# Patient Record
Sex: Female | Born: 1995 | Race: Black or African American | Hispanic: No | Marital: Single | Smoking: Former smoker
Health system: Southern US, Community
[De-identification: ages and names within clinical notes are randomized; demographics above are authoritative.]

## PROBLEM LIST (undated history)

## (undated) ENCOUNTER — Inpatient Hospital Stay (HOSPITAL_COMMUNITY): Payer: Self-pay

## (undated) DIAGNOSIS — R569 Unspecified convulsions: Secondary | ICD-10-CM

## (undated) DIAGNOSIS — E282 Polycystic ovarian syndrome: Secondary | ICD-10-CM

## (undated) DIAGNOSIS — F419 Anxiety disorder, unspecified: Secondary | ICD-10-CM

## (undated) DIAGNOSIS — B009 Herpesviral infection, unspecified: Secondary | ICD-10-CM

## (undated) DIAGNOSIS — F32A Depression, unspecified: Secondary | ICD-10-CM

## (undated) DIAGNOSIS — L732 Hidradenitis suppurativa: Secondary | ICD-10-CM

## (undated) DIAGNOSIS — F329 Major depressive disorder, single episode, unspecified: Secondary | ICD-10-CM

## (undated) DIAGNOSIS — J45909 Unspecified asthma, uncomplicated: Secondary | ICD-10-CM

## (undated) HISTORY — DX: Polycystic ovarian syndrome: E28.2

## (undated) HISTORY — DX: Hidradenitis suppurativa: L73.2

## (undated) HISTORY — PX: CYSTECTOMY: SUR359

---
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
## 2017-01-04 ENCOUNTER — Ambulatory Visit (HOSPITAL_COMMUNITY)
Admission: RE | Admit: 2017-01-04 | Discharge: 2017-01-04 | Disposition: A | Discharge status: Home / Self Care | Payer: Medicaid Other | Source: Ambulatory Visit | Attending: Family Medicine | Admitting: Family Medicine

## 2017-01-04 DIAGNOSIS — G8929 Other chronic pain: Secondary | ICD-10-CM | POA: Diagnosis not present

## 2017-01-04 DIAGNOSIS — R102 Pelvic and perineal pain: Secondary | ICD-10-CM | POA: Diagnosis not present

## 2017-01-11 ENCOUNTER — Other Ambulatory Visit: Payer: Self-pay | Admitting: Family Medicine

## 2017-01-11 DIAGNOSIS — R102 Pelvic and perineal pain: Principal | ICD-10-CM

## 2017-01-11 DIAGNOSIS — G8929 Other chronic pain: Secondary | ICD-10-CM

## 2017-01-11 DIAGNOSIS — Z8742 Personal history of other diseases of the female genital tract: Secondary | ICD-10-CM

## 2017-01-12 ENCOUNTER — Telehealth: Payer: Self-pay

## 2017-01-12 NOTE — Telephone Encounter (Signed)
-----   Message from Lizbeth BarkMandesia R Hairston, FNP sent at 01/11/2017  5:08 PM EDT ----- No mass no uterine fibroid seen.  No ovarian cysts or mass. You will be referred to gynecology for further evaluation.

## 2017-01-12 NOTE — Telephone Encounter (Signed)
CMA call regarding results   Patient did not answer but left a VM stating the reason of the call & to call back if have any questions   

## 2017-01-21 ENCOUNTER — Encounter: Payer: Self-pay | Admitting: Medical

## 2017-01-21 ENCOUNTER — Ambulatory Visit (INDEPENDENT_AMBULATORY_CARE_PROVIDER_SITE_OTHER): Payer: Medicaid Other | Admitting: Medical

## 2017-01-21 VITALS — BP 116/66 | HR 64 | Ht 65.0 in | Wt 186.0 lb

## 2017-01-21 DIAGNOSIS — N926 Irregular menstruation, unspecified: Secondary | ICD-10-CM

## 2017-01-21 MED ORDER — PRENATAL VITAMINS 0.8 MG PO TABS: 1.0000 | ORAL_TABLET | Freq: Every day | ORAL | 12 refills | Status: DC

## 2017-01-21 NOTE — Progress Notes (Signed)
History:  [name] is a 21 y.o. female who presents to clinic today for follow-up for PCOS and irregular periods. The patient was started on OCPs to attempt to regulate her cycles. She also wants to conceive, so this was expected to be a short term plan. Since her last visit the patient has discontinued OCPs on her own after 3 months. She states that she has had 2 regular periods since then. She has tried ovulation tests once and had a positive. Her and her partner did attempt to conceive that month, but were unsuccessful. She did not take ovulation tests this month. She has also been having lower abdominal pain off and on for months. She states that it got much worse a few weeks ago and she went to her PCP. All infection testing, pap smear and [place] were negative. She feels pain is improved today. Pain seems to be unrelated to her cycles, diet or activity.    The following portions of the patient's history were reviewed and updated as appropriate: allergies, current medications, family history, past medical history, social history, past surgical history and problem list.  Review of Systems:  Review of Systems  Constitutional: Negative for chills and fever.  Gastrointestinal: Positive for abdominal pain. Negative for constipation, diarrhea, nausea and vomiting.  Genitourinary: Negative for dysuria, frequency and urgency.       Neg - vaginal bleeding      Objective:  Physical Exam BP 116/66   Pulse 64   Ht 5\' 5"  (1.651 m)   Wt 186 lb (84.4 kg)   LMP 12/25/2016   BMI 30.95 kg/m  Physical Exam  Constitutional: She is oriented to person, place, and time. She appears well-developed and well-nourished. No distress.  HENT:  Head: Normocephalic.  Cardiovascular: Normal rate.   Pulmonary/Chest: Effort normal.  Abdominal: Soft. She exhibits no distension.  Neurological: She is alert and oriented to person, place, and time.  Skin: Skin is warm and dry. No erythema.  Psychiatric: She has a  normal mood and affect.  Vitals reviewed.   Assessment & Plan:  PCOS Desired fertility   Patient advised to continue to track periods and ovulation Patient will continue to have timed intercourse If periods become irregular or she has not conceived in 1 year, will refer to Dr. April Manson for infertility  Continue Ibuprofen PRN for pain Rx for prenatal vitamins sent to patient's pharmacy   Marny Lowenstein, PA-C 01/21/2017 10:37 AM

## 2017-01-21 NOTE — Patient Instructions (Addendum)
Continue tracking periods regularly Continue monthly ovulation tests A period is considered regular if it is 26-32 days from the first day of one period to the first day of the next If 2 periods do not fall in regular range, then call the office for referral to infertility  If no positive ovulation test for 2 months, then call the office for referral to infertility Have intercourse with positive ovulation tests Take 1 prenatal vitamin every day Avoid alcohol If abdominal pain worsens or continues for more than 1 month, call primary doctor for appointment If you are not pregnant in 1 year and have been having regular periods, call the office for a referral to infertility

## 2017-01-22 ENCOUNTER — Encounter: Payer: Self-pay | Admitting: Family Medicine

## 2017-01-22 ENCOUNTER — Ambulatory Visit: Payer: Medicaid Other | Attending: Family Medicine | Admitting: Family Medicine

## 2017-01-22 VITALS — BP 109/70 | HR 74 | Temp 98.4°F | Resp 18 | Ht 65.0 in | Wt 190.0 lb

## 2017-01-22 DIAGNOSIS — R109 Unspecified abdominal pain: Secondary | ICD-10-CM | POA: Diagnosis present

## 2017-01-22 DIAGNOSIS — R350 Frequency of micturition: Secondary | ICD-10-CM

## 2017-01-22 DIAGNOSIS — R1032 Left lower quadrant pain: Secondary | ICD-10-CM | POA: Diagnosis not present

## 2017-01-22 DIAGNOSIS — Z79899 Other long term (current) drug therapy: Secondary | ICD-10-CM | POA: Diagnosis not present

## 2017-01-22 DIAGNOSIS — G8929 Other chronic pain: Secondary | ICD-10-CM

## 2017-01-22 DIAGNOSIS — R102 Pelvic and perineal pain: Secondary | ICD-10-CM | POA: Diagnosis not present

## 2017-01-22 LAB — POCT URINE PREGNANCY: PREG TEST UR: NEGATIVE

## 2017-01-22 LAB — POCT URINALYSIS DIPSTICK
Bilirubin, UA: NEGATIVE
GLUCOSE UA: NEGATIVE
Ketones, UA: NEGATIVE
NITRITE UA: NEGATIVE
PH UA: 7.5 (ref 5.0–8.0)
Protein, UA: NEGATIVE
RBC UA: NEGATIVE
Spec Grav, UA: 1.015 (ref 1.010–1.025)
UROBILINOGEN UA: 0.2 U/dL

## 2017-01-22 LAB — POCT GLYCOSYLATED HEMOGLOBIN (HGB A1C): Hemoglobin A1C: 5.5

## 2017-01-22 MED ORDER — IBUPROFEN 600 MG PO TABS: 600.0000 mg | ORAL_TABLET | Freq: Four times a day (QID) | ORAL | 0 refills | Status: DC | PRN

## 2017-01-22 NOTE — Progress Notes (Signed)
Patient is here for f/up  

## 2017-01-22 NOTE — Progress Notes (Signed)
Subjective:  Patient ID: Jenna Henry, female    DOB: 03/01/1996  Age: 21 y.o. MRN: [id number]  CC: Abdominal Pain   HPI Jenna Henry presents for abdominal pain. The pain is described as aching, and is moderate to severe in intensity. Pain 7 to 8/10. Onset was several months ago.  Denies worsening of symptoms with movement and eating. Previous workup includes ultrasound and gynecology referral. Alleviating factors: NSAID's. The patient denies chills, dysuria, hematochezia, hematuria and melena. She does reports increased urinary frequency.  Outpatient Medications Prior to Visit  Medication Sig Dispense Refill  . acetaminophen (TYLENOL) 500 MG tablet Take 1,000 mg by mouth every 6 (six) hours as needed for mild pain or moderate pain.    Marland Kitchen doxycycline (VIBRAMYCIN) 100 MG capsule Take 1 capsule (100 mg total) by mouth 2 (two) times daily. One po bid x 7 days (Patient not taking: Reported on 10/12/2016) 14 capsule 0  . famotidine (PEPCID) 20 MG tablet Take 1 tablet (20 mg total) by mouth 2 (two) times daily. 30 tablet 0  . fluconazole (DIFLUCAN) 150 MG tablet Take 1 tab (150 mg) today and 1 tab (150 mg) after completing antibiotics (Patient not taking: Reported on 10/27/2016) 2 tablet 0  . levonorgestrel-ethinyl estradiol (AVIANE,ALESSE,LESSINA) 0.1-20 MG-MCG tablet Take 1 tablet by mouth daily. (Patient not taking: Reported on 12/03/2016) 1 Package 11  . metroNIDAZOLE (FLAGYL) 500 MG tablet Take 1 tablet (500 mg total) by mouth 2 (two) times daily. (Patient not taking: Reported on 10/27/2016) 14 tablet 0  . omeprazole (PRILOSEC) 20 MG capsule Take 1 capsule (20 mg total) by mouth daily. (Patient not taking: Reported on 12/03/2016) 14 capsule 2  . ondansetron (ZOFRAN ODT) 8 MG disintegrating tablet Take 1 tablet (8 mg total) by mouth every 8 (eight) hours as needed for nausea or vomiting. 20 tablet 0  . Prenatal Multivit-Min-Fe-FA (PRENATAL VITAMINS) 0.8 MG tablet Take 1 tablet by mouth daily. 30 tablet 12    . valACYclovir (VALTREX) 500 MG tablet Take 1 tablet (500 mg total) by mouth daily. 30 tablet 6  . ibuprofen (ADVIL,MOTRIN) 600 MG tablet Take 1 tablet (600 mg total) by mouth every 6 (six) hours as needed (Take with food.). 30 tablet 0   No facility-administered medications prior to visit.     ROS Review of Systems  Constitutional: Negative.   HENT: Negative.   Eyes: Negative.   Respiratory: Negative.   Cardiovascular: Negative.   Gastrointestinal: Positive for abdominal pain.  Genitourinary: Negative.   Skin: Negative.   Psychiatric/Behavioral: Negative.     Objective:  BP 109/70 (BP Location: Left Arm, Patient Position: Sitting, Cuff Size: Normal)   Pulse 74   Temp 98.4 F (36.9 C) (Oral)   Resp 18   Ht 5\' 5"  (1.651 m)   Wt 190 lb (86.2 kg)   LMP 12/25/2016   SpO2 100%   BMI 31.62 kg/m   BP/Weight 01/22/2017 01/21/2017 12/23/2016  Systolic BP 109 116 99  Diastolic BP 70 66 65  Wt. (Lbs) 190 186 187.8  BMI 31.62 30.95 31.25     Physical Exam  Constitutional: She appears well-developed and well-nourished.  HENT:  Head: Normocephalic and atraumatic.  Right Ear: External ear normal.  Left Ear: External ear normal.  Nose: Nose normal.  Mouth/Throat: Oropharynx is clear and moist.  Eyes: Pupils are equal, round, and reactive to light. Conjunctivae are normal.  Neck: Normal range of motion. Neck supple. No JVD present.  Cardiovascular: Normal rate, regular rhythm, normal  heart sounds and intact distal pulses.   Pulmonary/Chest: Effort normal and breath sounds normal.  Abdominal: Soft. Bowel sounds are normal. There is tenderness.  Lymphadenopathy:    She has no cervical adenopathy.  Skin: Skin is warm and dry.  Psychiatric: She has a normal mood and affect.  Nursing note and vitals reviewed.   Assessment & Plan:   Problem List Items Addressed This Visit    None    Visit Diagnoses    Chronic abdominal pain    -  Primary   Relevant Medications   ibuprofen  (ADVIL,MOTRIN) 600 MG tablet   Other Relevant Orders   Ambulatory referral to Gastroenterology   LLQ abdominal pain       Relevant Orders   POCT urine pregnancy (Completed)   Increased urinary frequency       Relevant Orders   POCT glycosylated hemoglobin (Hb A1C) (Completed)   Urinalysis Dipstick (Completed)   Chronic pelvic pain in female       Relevant Medications   ibuprofen (ADVIL,MOTRIN) 600 MG tablet      Meds ordered this encounter  Medications  . ibuprofen (ADVIL,MOTRIN) 600 MG tablet    Sig: Take 1 tablet (600 mg total) by mouth every 6 (six) hours as needed (Take with food.).    Dispense:  30 tablet    Refill:  0    Order Specific Question:   Supervising Provider    Answer:   Quentin Angst [id number]    Follow-up: Return if symptoms worsen or fail to improve.   Lizbeth Bark FNP

## 2017-01-22 NOTE — Patient Instructions (Signed)

## 2017-02-11 ENCOUNTER — Other Ambulatory Visit (HOSPITAL_COMMUNITY)
Admission: RE | Admit: 2017-02-11 | Discharge: 2017-02-11 | Disposition: A | Discharge status: Home / Self Care | Payer: Medicaid Other | Source: Ambulatory Visit | Attending: Family Medicine | Admitting: Family Medicine

## 2017-02-11 ENCOUNTER — Ambulatory Visit: Payer: Self-pay | Attending: Family Medicine | Admitting: Family Medicine

## 2017-02-11 ENCOUNTER — Encounter: Payer: Self-pay | Admitting: Family Medicine

## 2017-02-11 VITALS — BP 101/66 | HR 67 | Temp 99.3°F | Resp 18 | Ht 65.0 in | Wt 193.8 lb

## 2017-02-11 DIAGNOSIS — N898 Other specified noninflammatory disorders of vagina: Secondary | ICD-10-CM | POA: Insufficient documentation

## 2017-02-11 DIAGNOSIS — Z789 Other specified health status: Secondary | ICD-10-CM

## 2017-02-11 DIAGNOSIS — R102 Pelvic and perineal pain: Secondary | ICD-10-CM | POA: Insufficient documentation

## 2017-02-11 DIAGNOSIS — R110 Nausea: Secondary | ICD-10-CM | POA: Insufficient documentation

## 2017-02-11 DIAGNOSIS — N939 Abnormal uterine and vaginal bleeding, unspecified: Secondary | ICD-10-CM | POA: Insufficient documentation

## 2017-02-11 DIAGNOSIS — G8929 Other chronic pain: Secondary | ICD-10-CM | POA: Insufficient documentation

## 2017-02-11 DIAGNOSIS — R109 Unspecified abdominal pain: Secondary | ICD-10-CM | POA: Insufficient documentation

## 2017-02-11 DIAGNOSIS — M5440 Lumbago with sciatica, unspecified side: Secondary | ICD-10-CM | POA: Insufficient documentation

## 2017-02-11 DIAGNOSIS — M545 Low back pain: Secondary | ICD-10-CM

## 2017-02-11 DIAGNOSIS — R42 Dizziness and giddiness: Secondary | ICD-10-CM | POA: Insufficient documentation

## 2017-02-11 LAB — POCT URINALYSIS DIPSTICK
BILIRUBIN UA: NEGATIVE
Blood, UA: NEGATIVE
GLUCOSE UA: NEGATIVE
Ketones, UA: NEGATIVE
Nitrite, UA: NEGATIVE
Protein, UA: NEGATIVE
SPEC GRAV UA: 1.015 (ref 1.010–1.025)
Urobilinogen, UA: 0.2 E.U./dL
pH, UA: 5.5 (ref 5.0–8.0)

## 2017-02-11 LAB — POCT URINE PREGNANCY: Preg Test, Ur: NEGATIVE

## 2017-02-11 MED ORDER — MECLIZINE HCL 32 MG PO TABS: 32.0000 mg | ORAL_TABLET | Freq: Three times a day (TID) | ORAL | 0 refills | Status: DC | PRN

## 2017-02-11 MED ORDER — IBUPROFEN 600 MG PO TABS: 600.0000 mg | ORAL_TABLET | Freq: Four times a day (QID) | ORAL | 0 refills | Status: DC | PRN

## 2017-02-11 NOTE — Progress Notes (Signed)
Patient is here for back pain & lower abdomen pain

## 2017-02-11 NOTE — Progress Notes (Signed)
Subjective:  Patient ID: Jenna Henry, female    DOB: [birth year]  Age: 21 y.o. MRN: [id number]  CC: Abdominal Pain   HPI Jenna Henry presents for abdominal pain. The pain is described as aching, and is moderate to severe in intensity. Pain 7 to 8/10. Onset was several months ago.  Denies worsening of symptoms with movement and eating. Previous workup includes ultrasound and gynecology referral. Findings were unremarkable. Alleviating factors: NSAID's. The patient denies chills, dysuria, hematochezia, hematuria and melena. She does reports increased urinary frequency,  vaginal discharge, and dizziness. She denies any headaches, palpitations,  or tinnitus. Discharge described as white, moderate. Currently sexually active she reports 1 sexual partner within the last 3 months. She reports attempting to conceive.  Outpatient Medications Prior to Visit  Medication Sig Dispense Refill  . Prenatal Multivit-Min-Fe-FA (PRENATAL VITAMINS) 0.8 MG tablet Take 1 tablet by mouth daily. 30 tablet 12  . acetaminophen (TYLENOL) 500 MG tablet Take 1,000 mg by mouth every 6 (six) hours as needed for mild pain or moderate pain.    Marland Kitchen levonorgestrel-ethinyl estradiol (AVIANE,ALESSE,LESSINA) 0.1-20 MG-MCG tablet Take 1 tablet by mouth daily. (Patient not taking: Reported on 12/03/2016) 1 Package 11  . ondansetron (ZOFRAN ODT) 8 MG disintegrating tablet Take 1 tablet (8 mg total) by mouth every 8 (eight) hours as needed for nausea or vomiting. 20 tablet 0  . valACYclovir (VALTREX) 500 MG tablet Take 1 tablet (500 mg total) by mouth daily. 30 tablet 6  . doxycycline (VIBRAMYCIN) 100 MG capsule Take 1 capsule (100 mg total) by mouth 2 (two) times daily. One po bid x 7 days (Patient not taking: Reported on 10/12/2016) 14 capsule 0  . famotidine (PEPCID) 20 MG tablet Take 1 tablet (20 mg total) by mouth 2 (two) times daily. 30 tablet 0  . fluconazole (DIFLUCAN) 150 MG tablet Take 1 tab (150 mg) today and 1 tab (150 mg) after  completing antibiotics (Patient not taking: Reported on 10/27/2016) 2 tablet 0  . ibuprofen (ADVIL,MOTRIN) 600 MG tablet Take 1 tablet (600 mg total) by mouth every 6 (six) hours as needed (Take with food.). 30 tablet 0  . metroNIDAZOLE (FLAGYL) 500 MG tablet Take 1 tablet (500 mg total) by mouth 2 (two) times daily. (Patient not taking: Reported on 10/27/2016) 14 tablet 0  . omeprazole (PRILOSEC) 20 MG capsule Take 1 capsule (20 mg total) by mouth daily. (Patient not taking: Reported on 12/03/2016) 14 capsule 2   No facility-administered medications prior to visit.     ROS Review of Systems  Constitutional: Negative.   HENT: Negative.   Eyes: Negative.   Respiratory: Negative.   Cardiovascular: Negative.   Gastrointestinal: Positive for abdominal pain.  Genitourinary: Negative.   Skin: Negative.   Psychiatric/Behavioral: Negative.     Objective:  BP 101/66 (BP Location: Left Arm, Patient Position: Sitting, Cuff Size: Normal)   Pulse 67   Temp 99.3 F (37.4 C) (Oral)   Resp 18   Ht  (1.651 m)   Wt 193 lb 12.8 oz (87.9 kg)   SpO2 99%   BMI 32.25 kg/m   BP/Weight 02/11/2017 01/22/2017 01/21/2017  Systolic BP 101 109 116  Diastolic BP 66 70 66  Wt. (Lbs) 193.8 190 186  BMI 32.25 31.62 30.95     Physical Exam  Constitutional: She appears well-developed and well-nourished.  HENT:  Head: Normocephalic and atraumatic.  Right Ear: External ear normal.  Left Ear: External ear normal.  Nose: Nose normal.  Mouth/Throat:  Oropharynx is clear and moist.  Eyes: Pupils are equal, round, and reactive to light. Conjunctivae are normal.  Neck: Normal range of motion. Neck supple. No JVD present.  Cardiovascular: Normal rate, regular rhythm, normal heart sounds and intact distal pulses.   Pulmonary/Chest: Effort normal and breath sounds normal.  Abdominal: Soft. Bowel sounds are normal. There is tenderness.  Lymphadenopathy:    She has no cervical adenopathy.  Skin: Skin is warm and  dry.  Psychiatric: She has a normal mood and affect.  Nursing note and vitals reviewed.   Assessment & Plan:   1. Chronic abdominal pain  - ibuprofen (ADVIL,MOTRIN) 600 MG tablet; Take 1 tablet (600 mg total) by mouth every 6 (six) hours as needed (Take with food.).  Dispense: 30 tablet; Refill: 0 - Ambulatory referral to Gastroenterology  2. Vaginal discharge  - Urine cytology ancillary only - POCT urine pregnancy  3. Acute midline low back pain, with sciatica presence unspecified  - Urinalysis Dipstick - ibuprofen (ADVIL,MOTRIN) 600 MG tablet; Take 1 tablet (600 mg total) by mouth every 6 (six) hours as needed (Take with food.).  Dispense: 30 tablet; Refill: 0  4. Dizziness  - meclizine (ANTIVERT) 32 MG tablet; Take 1 tablet (32 mg total) by mouth 3 (three) times daily as needed.  Dispense: 30 tablet; Refill: 0  5. Nausea  - meclizine (ANTIVERT) 32 MG tablet; Take 1 tablet (32 mg total) by mouth 3 (three) times daily as needed.  Dispense: 30 tablet; Refill: 0  6. Attempting to conceive  - POCT urine pregnancy  7. Chronic pelvic pain in female  - ibuprofen (ADVIL,MOTRIN) 600 MG tablet; Take 1 tablet (600 mg total) by mouth every 6 (six) hours as needed (Take with food.).  Dispense: 30 tablet; Refill: 0 - Urine cytology ancillary only   Meds ordered this encounter  Medications  . ibuprofen (ADVIL,MOTRIN) 600 MG tablet    Sig: Take 1 tablet (600 mg total) by mouth every 6 (six) hours as needed (Take with food.).    Dispense:  30 tablet    Refill:  0    Order Specific Question:   Supervising Provider    Answer:   Quentin AngstJEGEDE, OLUGBEMIGA E [id number]]  . meclizine (ANTIVERT) 32 MG tablet    Sig: Take 1 tablet (32 mg total) by mouth 3 (three) times daily as needed.    Dispense:  30 tablet    Refill:  0    Order Specific Question:   Supervising Provider    Answer:   Quentin AngstJEGEDE, OLUGBEMIGA E [id number]    Follow-up: Return if symptoms worsen or fail to improve.   Jenna BarkMandesia R  Daven Montz FNP

## 2017-02-11 NOTE — Patient Instructions (Signed)
[facility].   [phone]  [address], [place], [place] [id number]    Abdominal Pain, Adult Many things can cause belly (abdominal) pain. Most times, belly pain is not dangerous. Many cases of belly pain can be watched and treated at home. Sometimes belly pain is serious, though. Your doctor will try to find the cause of your belly pain. Follow these instructions at home:  Take over-the-counter and prescription medicines only as told by your doctor. Do not take medicines that help you poop (laxatives) unless told to by your doctor.  Drink enough fluid to keep your pee (urine) clear or pale yellow.  Watch your belly pain for any changes.  Keep all follow-up visits as told by your doctor. This is important. Contact a doctor if:  Your belly pain changes or gets worse.  You are not hungry, or you lose weight without trying.  You are having trouble pooping (constipated) or have watery poop (diarrhea) for more than 2-3 days.  You have pain when you pee or poop.  Your belly pain wakes you up at night.  Your pain gets worse with meals, after eating, or with certain foods.  You are throwing up and cannot keep anything down.  You have a fever. Get help right away if:  Your pain does not go away as soon as your doctor says it should.  You cannot stop throwing up.  Your pain is only in areas of your belly, such as the right side or the left lower part of the belly.  You have bloody or black poop, or poop that looks like tar.  You have very bad pain, cramping, or bloating in your belly.  You have signs of not having enough fluid or water in your body (dehydration), such as: ? Dark pee, very little pee, or no pee. ? Cracked lips. ? Dry mouth. ? Sunken eyes. ? Sleepiness. ? Weakness. This information is not intended to replace advice given to you by your health care provider. Make sure you discuss any questions you have with your health care provider. Document  Released: 11/04/2007 Document Revised: 12/06/2015 Document Reviewed: 10/30/2015 Elsevier Interactive Patient Education  2017 [employer]

## 2017-02-12 LAB — URINE CYTOLOGY ANCILLARY ONLY
CHLAMYDIA, DNA PROBE: NEGATIVE
NEISSERIA GONORRHEA: NEGATIVE
Trichomonas: NEGATIVE

## 2017-02-15 LAB — URINE CYTOLOGY ANCILLARY ONLY: CANDIDA VAGINITIS: NEGATIVE

## 2017-02-17 ENCOUNTER — Telehealth: Payer: Self-pay

## 2017-02-17 ENCOUNTER — Other Ambulatory Visit: Payer: Self-pay | Admitting: Family Medicine

## 2017-02-17 DIAGNOSIS — B9689 Other specified bacterial agents as the cause of diseases classified elsewhere: Secondary | ICD-10-CM

## 2017-02-17 DIAGNOSIS — N760 Acute vaginitis: Principal | ICD-10-CM

## 2017-02-17 MED ORDER — METRONIDAZOLE 500 MG PO TABS: 500.0000 mg | ORAL_TABLET | Freq: Two times a day (BID) | ORAL | 0 refills | Status: DC

## 2017-02-17 MED FILL — metroNIDAZOLE 500 MG TABS: 500 | 7 days supply | Qty: 14 | Fill #0

## 2017-02-17 NOTE — Telephone Encounter (Signed)
CMA call regarding lab results   Patient did not answer but left a VM stating the reason of the call & to call back  

## 2017-02-17 NOTE — Telephone Encounter (Signed)
-----   Message from Lizbeth Bark, FNP sent at 02/17/2017  8:44 AM EDT ----- Bacterial vaginosis was positive. BV is caused by an overgrowth of germs in the vagina. You will be prescribed metronidazole to treat. To reduce your risk of developing BV don't douche, don't use scented soap or sprays, and use protection during sexual intercourse.  Gonorrhea, Chlamydia, Yeast, and Trichomonas were all negative. Follow up with gastroenterology referral.

## 2017-02-20 ENCOUNTER — Other Ambulatory Visit: Payer: Self-pay

## 2017-02-20 ENCOUNTER — Emergency Department (HOSPITAL_COMMUNITY): Payer: Medicaid Other

## 2017-02-20 ENCOUNTER — Encounter (HOSPITAL_COMMUNITY): Payer: Self-pay | Admitting: Emergency Medicine

## 2017-02-20 DIAGNOSIS — F419 Anxiety disorder, unspecified: Secondary | ICD-10-CM | POA: Insufficient documentation

## 2017-02-20 DIAGNOSIS — Z5321 Procedure and treatment not carried out due to patient leaving prior to being seen by health care provider: Secondary | ICD-10-CM | POA: Insufficient documentation

## 2017-02-20 LAB — BASIC METABOLIC PANEL
ANION GAP: 9 (ref 5–15)
BUN: 8 mg/dL (ref 6–20)
CHLORIDE: 105 mmol/L (ref 101–111)
CO2: 24 mmol/L (ref 22–32)
Calcium: 11.1 mg/dL — ABNORMAL HIGH (ref 8.9–10.3)
Creatinine, Ser: 0.62 mg/dL (ref 0.44–1.00)
Glucose, Bld: 89 mg/dL (ref 65–99)
Potassium: 3.7 mmol/L (ref 3.5–5.1)
Sodium: 138 mmol/L (ref 135–145)

## 2017-02-20 LAB — CBC
HEMATOCRIT: 41.9 % (ref 36.0–46.0)
HEMOGLOBIN: 14.4 g/dL (ref 12.0–15.0)
MCH: 31.4 pg (ref 26.0–34.0)
MCHC: 34.4 g/dL (ref 30.0–36.0)
MCV: 91.5 fL (ref 78.0–100.0)
Platelets: 239 10*3/uL (ref 150–400)
RBC: 4.58 MIL/uL (ref 3.87–5.11)
RDW: 13.4 % (ref 11.5–15.5)
WBC: 4.7 10*3/uL (ref 4.0–10.5)

## 2017-02-20 LAB — POCT I-STAT TROPONIN I: Troponin i, poc: 0 ng/mL (ref 0.00–0.08)

## 2017-02-20 NOTE — ED Triage Notes (Addendum)
Per EMS, patient from home, c/o anxiety, panic attack, chest pain and SOB. Denies abdominal pain, N/V/D.   Patient c/o sharp central chest pain x approximately one hour. Hx anxiety.   BP 118/76 HR 82 RR 18

## 2017-02-21 ENCOUNTER — Emergency Department (HOSPITAL_COMMUNITY)
Admission: EM | Admit: 2017-02-21 | Discharge: 2017-02-21 | Disposition: A | Discharge status: Home / Self Care | Payer: Medicaid Other | Attending: Emergency Medicine | Admitting: Emergency Medicine

## 2017-02-21 NOTE — ED Notes (Signed)
PT CALLED FOR UPDATE WITH NO ANSWER 

## 2017-02-21 NOTE — ED Notes (Signed)
Pt called for rm no answer

## 2017-02-26 ENCOUNTER — Emergency Department (HOSPITAL_COMMUNITY)
Admission: EM | Admit: 2017-02-26 | Discharge: 2017-02-27 | Disposition: A | Discharge status: Home / Self Care | Payer: Medicaid Other | Attending: Emergency Medicine | Admitting: Emergency Medicine

## 2017-02-26 ENCOUNTER — Encounter (HOSPITAL_COMMUNITY): Payer: Self-pay

## 2017-02-26 DIAGNOSIS — R109 Unspecified abdominal pain: Secondary | ICD-10-CM | POA: Insufficient documentation

## 2017-02-26 DIAGNOSIS — Z5321 Procedure and treatment not carried out due to patient leaving prior to being seen by health care provider: Secondary | ICD-10-CM | POA: Insufficient documentation

## 2017-02-26 LAB — URINALYSIS, ROUTINE W REFLEX MICROSCOPIC
BILIRUBIN URINE: NEGATIVE
Bacteria, UA: NONE SEEN
GLUCOSE, UA: NEGATIVE mg/dL
HGB URINE DIPSTICK: NEGATIVE
KETONES UR: NEGATIVE mg/dL
Nitrite: NEGATIVE
PH: 5 (ref 5.0–8.0)
Protein, ur: NEGATIVE mg/dL
Specific Gravity, Urine: 1.023 (ref 1.005–1.030)

## 2017-02-26 LAB — COMPREHENSIVE METABOLIC PANEL
ALT: 38 U/L (ref 14–54)
ANION GAP: 9 (ref 5–15)
AST: 27 U/L (ref 15–41)
Albumin: 4.7 g/dL (ref 3.5–5.0)
Alkaline Phosphatase: 63 U/L (ref 38–126)
BUN: 9 mg/dL (ref 6–20)
CALCIUM: 11.2 mg/dL — AB (ref 8.9–10.3)
CHLORIDE: 106 mmol/L (ref 101–111)
CO2: 25 mmol/L (ref 22–32)
CREATININE: 0.61 mg/dL (ref 0.44–1.00)
GFR calc non Af Amer: >60 mL/min (ref >60)
Glucose, Bld: 103 mg/dL — ABNORMAL HIGH (ref 65–99)
Potassium: 3.5 mmol/L (ref 3.5–5.1)
Sodium: 140 mmol/L (ref 135–145)
Total Bilirubin: 0.4 mg/dL (ref 0.3–1.2)
Total Protein: 8.2 g/dL — ABNORMAL HIGH (ref 6.5–8.1)

## 2017-02-26 LAB — CBC
HCT: 41.2 % (ref 36.0–46.0)
Hemoglobin: 14.2 g/dL (ref 12.0–15.0)
MCH: 31.6 pg (ref 26.0–34.0)
MCHC: 34.5 g/dL (ref 30.0–36.0)
MCV: 91.6 fL (ref 78.0–100.0)
PLATELETS: 242 10*3/uL (ref 150–400)
RBC: 4.5 MIL/uL (ref 3.87–5.11)
RDW: 13.6 % (ref 11.5–15.5)
WBC: 6.1 10*3/uL (ref 4.0–10.5)

## 2017-02-26 LAB — LIPASE, BLOOD: LIPASE: 24 U/L (ref 11–51)

## 2017-02-26 LAB — I-STAT BETA HCG BLOOD, ED (MC, WL, AP ONLY): I-stat hCG, quantitative: <5 m[IU]/mL (ref <5)

## 2017-02-26 NOTE — ED Notes (Signed)
Called for Pt in lobby no response. 

## 2017-02-26 NOTE — ED Triage Notes (Signed)
Per EMS- patient c/o mid and upper abdominal pain. Patient states 10/10 pain stabbing in nature. Patient denies any N/V/D. Pain worse with inspiration

## 2017-02-26 NOTE — ED Notes (Signed)
Pt called for recheck v/s, no response from lobby 

## 2017-02-27 ENCOUNTER — Emergency Department (HOSPITAL_COMMUNITY)
Admission: EM | Admit: 2017-02-27 | Discharge: 2017-02-27 | Disposition: A | Discharge status: Other Acute Inpatient Hospital | Payer: Medicaid Other | Attending: Emergency Medicine | Admitting: Emergency Medicine

## 2017-02-27 ENCOUNTER — Inpatient Hospital Stay (HOSPITAL_COMMUNITY)
Admission: AD | Admit: 2017-02-27 | Discharge: 2017-03-02 | DRG: 885 | Disposition: A | Discharge status: Home / Self Care | Payer: Federal, State, Local not specified - Other | Source: Intra-hospital | Attending: Psychiatry | Admitting: Psychiatry

## 2017-02-27 ENCOUNTER — Encounter (HOSPITAL_COMMUNITY): Payer: Self-pay | Admitting: *Deleted

## 2017-02-27 DIAGNOSIS — Z888 Allergy status to other drugs, medicaments and biological substances status: Secondary | ICD-10-CM | POA: Diagnosis not present

## 2017-02-27 DIAGNOSIS — Z7983 Long term (current) use of bisphosphonates: Secondary | ICD-10-CM | POA: Insufficient documentation

## 2017-02-27 DIAGNOSIS — Y929 Unspecified place or not applicable: Secondary | ICD-10-CM | POA: Insufficient documentation

## 2017-02-27 DIAGNOSIS — B009 Herpesviral infection, unspecified: Secondary | ICD-10-CM | POA: Diagnosis present

## 2017-02-27 DIAGNOSIS — F5105 Insomnia due to other mental disorder: Secondary | ICD-10-CM | POA: Diagnosis present

## 2017-02-27 DIAGNOSIS — R443 Hallucinations, unspecified: Secondary | ICD-10-CM | POA: Diagnosis not present

## 2017-02-27 DIAGNOSIS — T39312A Poisoning by propionic acid derivatives, intentional self-harm, initial encounter: Secondary | ICD-10-CM | POA: Diagnosis not present

## 2017-02-27 DIAGNOSIS — T1491XA Suicide attempt, initial encounter: Secondary | ICD-10-CM

## 2017-02-27 DIAGNOSIS — T391X2A Poisoning by 4-Aminophenol derivatives, intentional self-harm, initial encounter: Secondary | ICD-10-CM | POA: Diagnosis not present

## 2017-02-27 DIAGNOSIS — Z915 Personal history of self-harm: Secondary | ICD-10-CM | POA: Diagnosis not present

## 2017-02-27 DIAGNOSIS — F333 Major depressive disorder, recurrent, severe with psychotic symptoms: Principal | ICD-10-CM | POA: Diagnosis present

## 2017-02-27 DIAGNOSIS — Y999 Unspecified external cause status: Secondary | ICD-10-CM | POA: Insufficient documentation

## 2017-02-27 DIAGNOSIS — R45851 Suicidal ideations: Secondary | ICD-10-CM

## 2017-02-27 DIAGNOSIS — X58XXXA Exposure to other specified factors, initial encounter: Secondary | ICD-10-CM | POA: Insufficient documentation

## 2017-02-27 DIAGNOSIS — Z566 Other physical and mental strain related to work: Secondary | ICD-10-CM | POA: Diagnosis not present

## 2017-02-27 DIAGNOSIS — R4584 Anhedonia: Secondary | ICD-10-CM | POA: Diagnosis not present

## 2017-02-27 DIAGNOSIS — Y9389 Activity, other specified: Secondary | ICD-10-CM | POA: Insufficient documentation

## 2017-02-27 DIAGNOSIS — J45909 Unspecified asthma, uncomplicated: Secondary | ICD-10-CM | POA: Insufficient documentation

## 2017-02-27 DIAGNOSIS — F411 Generalized anxiety disorder: Secondary | ICD-10-CM | POA: Diagnosis not present

## 2017-02-27 DIAGNOSIS — Z79899 Other long term (current) drug therapy: Secondary | ICD-10-CM | POA: Insufficient documentation

## 2017-02-27 DIAGNOSIS — G4700 Insomnia, unspecified: Secondary | ICD-10-CM

## 2017-02-27 DIAGNOSIS — R5383 Other fatigue: Secondary | ICD-10-CM | POA: Diagnosis not present

## 2017-02-27 DIAGNOSIS — E282 Polycystic ovarian syndrome: Secondary | ICD-10-CM | POA: Diagnosis present

## 2017-02-27 DIAGNOSIS — F329 Major depressive disorder, single episode, unspecified: Secondary | ICD-10-CM | POA: Insufficient documentation

## 2017-02-27 HISTORY — DX: Anxiety disorder, unspecified: F41.9

## 2017-02-27 HISTORY — DX: Major depressive disorder, single episode, unspecified: F32.9

## 2017-02-27 HISTORY — DX: Depression, unspecified: F32.A

## 2017-02-27 LAB — I-STAT BETA HCG BLOOD, ED (MC, WL, AP ONLY)

## 2017-02-27 LAB — COMPREHENSIVE METABOLIC PANEL
ALBUMIN: 4.3 g/dL (ref 3.5–5.0)
ALT: 32 U/L (ref 14–54)
AST: 26 U/L (ref 15–41)
Alkaline Phosphatase: 57 U/L (ref 38–126)
Anion gap: 8 (ref 5–15)
BUN: 8 mg/dL (ref 6–20)
CALCIUM: 10.6 mg/dL — AB (ref 8.9–10.3)
CO2: 22 mmol/L (ref 22–32)
CREATININE: 0.71 mg/dL (ref 0.44–1.00)
Chloride: 108 mmol/L (ref 101–111)
GFR calc non Af Amer: >60 mL/min (ref >60)
GLUCOSE: 107 mg/dL — AB (ref 65–99)
Potassium: 3.2 mmol/L — ABNORMAL LOW (ref 3.5–5.1)
Sodium: 138 mmol/L (ref 135–145)
Total Bilirubin: 1 mg/dL (ref 0.3–1.2)
Total Protein: 7.6 g/dL (ref 6.5–8.1)

## 2017-02-27 LAB — CBC
HEMATOCRIT: 41.4 % (ref 36.0–46.0)
HEMOGLOBIN: 13.9 g/dL (ref 12.0–15.0)
MCH: 30.8 pg (ref 26.0–34.0)
MCHC: 33.6 g/dL (ref 30.0–36.0)
MCV: 91.8 fL (ref 78.0–100.0)
Platelets: 228 10*3/uL (ref 150–400)
RBC: 4.51 MIL/uL (ref 3.87–5.11)
RDW: 13.3 % (ref 11.5–15.5)
WBC: 6 10*3/uL (ref 4.0–10.5)

## 2017-02-27 LAB — SALICYLATE LEVEL: Salicylate Lvl: <7 mg/dL (ref 2.8–30.0)

## 2017-02-27 LAB — RAPID URINE DRUG SCREEN, HOSP PERFORMED
AMPHETAMINES: NOT DETECTED
BARBITURATES: NOT DETECTED
Benzodiazepines: NOT DETECTED
Cocaine: NOT DETECTED
Opiates: NOT DETECTED
TETRAHYDROCANNABINOL: NOT DETECTED

## 2017-02-27 LAB — ETHANOL: Alcohol, Ethyl (B): <10 mg/dL (ref <10)

## 2017-02-27 LAB — ACETAMINOPHEN LEVEL: Acetaminophen (Tylenol), Serum: <10 ug/mL — ABNORMAL LOW (ref 10–30)

## 2017-02-27 MED ORDER — GI COCKTAIL ~~LOC~~
30.0000 mL | Freq: Once | ORAL | Status: AC
  Administered 2017-02-27: 30 mL via ORAL
  Filled 2017-02-27: qty 30

## 2017-02-27 MED ORDER — POTASSIUM CHLORIDE CRYS ER 20 MEQ PO TBCR
40.0000 meq | EXTENDED_RELEASE_TABLET | Freq: Once | ORAL | Status: AC
  Administered 2017-02-27: 40 meq via ORAL
  Filled 2017-02-27: qty 2

## 2017-02-27 MED ORDER — ONDANSETRON 4 MG PO TBDP
4.0000 mg | ORAL_TABLET | Freq: Once | ORAL | Status: AC
  Administered 2017-02-27: 4 mg via ORAL
  Filled 2017-02-27: qty 1

## 2017-02-27 NOTE — ED Notes (Signed)
Per Jillyn Hidden in staffing, sitter on the way.

## 2017-02-27 NOTE — ED Notes (Signed)
Mother remains with patient in triage. Patient is calm and cooperative. No needs. Updated on plan of care.

## 2017-02-27 NOTE — ED Notes (Signed)
Pt reports improvement of nausea and abd discomfort, requesting applesauce and cookies, which were provided

## 2017-02-27 NOTE — ED Provider Notes (Signed)
[facility]-EMERGENCY DEPT Provider Note   CSN: [id number] Arrival date & time: 02/27/17  1530     History   Chief Complaint Chief Complaint  Patient presents with  . Drug Overdose  . Suicide Attempt    HPI Jenna Henry is a 21 y.o. female.  Patient presents after attempt of self harm. Patient tried to kill herself by ingesting 3 800 mg ibuprofen and 8 Tylenol pills yesterday around 1:00 PM. Patient reports her boyfriend broke up with her. Patient has a history of depression and anxiety. Patient does not take meds at this time. Patient does want help. Patient has history of anxiety      Past Medical History:  Diagnosis Date  . Anxiety   . Asthma   . Depression   . Herpes   . Polycystic ovaries     Patient Active Problem List   Diagnosis Date Noted  . Herpes 11/12/2016  . Irregular menstruation 07/06/2016    Past Surgical History:  Procedure Laterality Date  . CYSTECTOMY      OB History    No data available       Home Medications    Prior to Admission medications   Medication Sig Start Date End Date Taking? Authorizing Provider  acetaminophen (TYLENOL) 500 MG tablet Take 1,000 mg by mouth every 6 (six) hours as needed for mild pain or moderate pain.    [provider]  ibuprofen (ADVIL,MOTRIN) 600 MG tablet Take 1 tablet (600 mg total) by mouth every 6 (six) hours as needed (Take with food.). 02/11/17   Lizbeth Bark, FNP  levonorgestrel-ethinyl estradiol (AVIANE,ALESSE,LESSINA) 0.1-20 MG-MCG tablet Take 1 tablet by mouth daily. 09/18/16   Anders Simmonds, PA-C  meclizine (ANTIVERT) 32 MG tablet Take 1 tablet (32 mg total) by mouth 3 (three) times daily as needed. 02/11/17   Lizbeth Bark, FNP  metroNIDAZOLE (FLAGYL) 500 MG tablet Take 1 tablet (500 mg total) by mouth 2 (two) times daily. 02/17/17   Lizbeth Bark, FNP  ondansetron (ZOFRAN ODT) 8 MG disintegrating tablet Take 1 tablet (8 mg total) by mouth every 8 (eight) hours as needed  for nausea or vomiting. 12/03/16   Kirichenko, Lemont Fillers, PA-C  Prenatal Multivit-Min-Fe-FA (PRENATAL VITAMINS) 0.8 MG tablet Take 1 tablet by mouth daily. 01/21/17   Marny Lowenstein, PA-C  valACYclovir (VALTREX) 500 MG tablet Take 1 tablet (500 mg total) by mouth daily. 11/12/16   Lizbeth Bark, FNP    Family History Family History  Problem Relation Age of Onset  . Hypertension Mother     Social History Social History  Substance Use Topics  . Smoking status: Never Smoker  . Smokeless tobacco: Never Used  . Alcohol use No     Allergies   Ortho tri-cyclen [norgestimate-eth estradiol]   Review of Systems Review of Systems  Constitutional: Negative for chills and fever.  HENT: Negative for congestion.   Eyes: Negative for visual disturbance.  Respiratory: Negative for shortness of breath.   Cardiovascular: Negative for chest pain.  Gastrointestinal: Negative for abdominal pain and vomiting.  Genitourinary: Negative for dysuria and flank pain.  Musculoskeletal: Negative for back pain, neck pain and neck stiffness.  Skin: Negative for rash.  Neurological: Negative for light-headedness and headaches.  Psychiatric/Behavioral: Positive for self-injury and suicidal ideas.     Physical Exam Updated Vital Signs BP 119/82 (BP Location: Left Arm)   Pulse 81   Temp 98.8 F (37.1 C) (Oral)   Resp 17   Ht   (1.651 m)   Wt 83.9 kg (185 lb)   SpO2 97%   BMI 30.79 kg/m   Physical Exam  Constitutional: She is oriented to person, place, and time. She appears well-developed and well-nourished.  HENT:  Head: Normocephalic and atraumatic.  Eyes: Conjunctivae are normal. Right eye exhibits no discharge. Left eye exhibits no discharge.  Neck: Normal range of motion. Neck supple. No tracheal deviation present.  Cardiovascular: Normal rate and regular rhythm.   Pulmonary/Chest: Effort normal and breath sounds normal.  Abdominal: Soft. She exhibits no distension. There is no  tenderness. There is no guarding.  Musculoskeletal: She exhibits no edema.  Neurological: She is alert and oriented to person, place, and time.  Skin: Skin is warm. No rash noted.  Psychiatric: Her mood appears anxious. She expresses suicidal ideation. She expresses suicidal plans.  Nursing note and vitals reviewed.    ED Treatments / Results  Labs (all labs ordered are listed, but only abnormal results are displayed) Labs Reviewed  COMPREHENSIVE METABOLIC PANEL - Abnormal; Notable for the following:       Result Value   Potassium 3.2 (*)    Glucose, Bld 107 (*)    Calcium 10.6 (*)    All other components within normal limits  ACETAMINOPHEN LEVEL - Abnormal; Notable for the following:    Acetaminophen (Tylenol), Serum <10 (*)    All other components within normal limits  ETHANOL  SALICYLATE LEVEL  CBC  RAPID URINE DRUG SCREEN, HOSP PERFORMED  I-STAT BETA HCG BLOOD, ED ([facility], [facility], [facility] ONLY)    EKG  EKG Interpretation None       Radiology No results found.  Procedures Procedures (including critical care time)  Medications Ordered in ED Medications  ondansetron (ZOFRAN-ODT) disintegrating tablet 4 mg (4 mg Oral Given 02/27/17 2010)     Initial Impression / Assessment and Plan / ED Course  I have reviewed the triage vital signs and the nursing notes.  Pertinent labs & imaging results that were available during my care of the patient were reviewed by me and considered in my medical decision making (see chart for details).     Patient presents after suicide attempt. Patient medically clear at this time. Behavior health consult at for further recommendations.  Accepted Behavioral health Dr Jama Flavors, transfer at 11 pm.    Final Clinical Impressions(s) / ED Diagnoses   Final diagnoses:  Suicidal ideation  Suicide attempt [facility])    New Prescriptions New Prescriptions   No medications on file     Blane Ohara, MD 02/27/17 2116

## 2017-02-27 NOTE — ED Triage Notes (Addendum)
Patient states she tried to kill herself by taking 3 ibuprofen and 8 tylenol. Patient with a history of suicide attempt. Patient is calm, pleasant, and does not appear in any distress in triage. texting on phone. Mother at bedside. Patient with history of depression and anxiety, does not take meds at this time but has in the past. Patient reports her bf broke up with her.

## 2017-02-27 NOTE — ED Notes (Signed)
Pt accepted at [facility] to room 400-1 after 11pm; Dr.Cobos is accepting physician

## 2017-02-27 NOTE — BH Assessment (Signed)
Tele Assessment Note   Patient Name: Jenna Henry MRN: [id number] Referring Physician: Dr. Blane Ohara Location of Patient: [facility] Location of Provider: Behavioral Health TTS Department  Harmoni Lucus is an 21 y.o. female.  -Clinician reviewed note by Dr. Jodi Mourning.  Patient presents after attempt of self harm. Patient tried to kill herself by ingesting 3 800 mg ibuprofen and 8 Tylenol pills yesterday around 1:00 PM. Patient reports her boyfriend broke up with her. Patient has a history of depression and anxiety. Patient does not take meds at this time. Patient does want help. Patient has history of anxiety.  Patient is accompanied by her mother, who she gives permission to be present during assessment.  Patient said that she took the overdose of eight Tylenol and three  Ibuprophen yesterday (02/26/17).  She called EMS who transported her to [facility].  She said that due to the wait time there (over 7 hours) she and mother left.  This morning (09/29) she complained to mother about stomach pain and mother brought her to [facility].  She had also wanted to seek psychiatric help.  Patient says she has been depressed lately.  She had a recent breakup w/ boyfriend.  Patient says that she has been feeling very depressed and anxious even before the breakup.  Patient says that she has been having some thoughts of suicide.  Patient says that this however was an effort "to get away from everything."  Patient admits to a previous suicide attempt by overdose in 2016.  Patient denies any HI or auditory hallucinations.  She says that sometimes she may see something out of the corner of her eye but attributes this to not getting good sleep.  Patient denies any current use of ETOH or illicit drugs.  UDS is clear.  Patient lives w/ her mother.  They moved here from [facility] in June of 2017 and have not gotten established with any outpatient providers.  Patient did have a psychiatrist in [place] however.  She was  admitted to a psychiatric facility there in 2016 for overdose.  -Clinician discussed patient care with Nira Conn, FNP who recommends inpatient psychiatric care.  Patient accepted to [facility] to services of Dr. Jama Flavors.  Clinician talked to Dr. Jodi Mourning and let him know patient had been accepted.  Clinician informed nurse [place] that patient had been accepted and could come after 23:00.   TTS does request that voluntary admission papers be faxed to [phone] before patient transfer.  Diagnosis: MDD recurrent, severe; Generalized Anxiety D/O  Past Medical History:  Past Medical History:  Diagnosis Date  . Anxiety   . Asthma   . Depression   . Herpes   . Polycystic ovaries     Past Surgical History:  Procedure Laterality Date  . CYSTECTOMY      Family History:  Family History  Problem Relation Age of Onset  . Hypertension Mother     Social History:  reports that she has never smoked. She has never used smokeless tobacco. She reports that she does not drink alcohol or use drugs.  Additional Social History:  Alcohol / Drug Use Pain Medications: prescription strength Ibuprophen Prescriptions: Pre-natal vitamins; an antibiotic; Valtrex when needed Over the Counter: Tylenol  History of alcohol / drug use?: No history of alcohol / drug abuse (Not a regular user.)  CIWA: CIWA-Ar BP: 119/82 Pulse Rate: 81 COWS:    PATIENT STRENGTHS: (choose at least two) Ability for insight Average or above average intelligence Communication skills Motivation for  treatment/growth Supportive family/friends Work skills  Allergies:  Allergies  Allergen Reactions  . Ortho Tri-Cyclen [Norgestimate-Eth Estradiol] Hives, Itching, Rash and Other (See Comments)    Burning all over    Home Medications:  (Not in a hospital admission)  OB/GYN Status:  No LMP recorded. Patient is not currently having periods (Reason: Irregular Periods).  General Assessment Data Location of Assessment: [facility]  ED TTS Assessment: In system Is this a Tele or Face-to-Face Assessment?: Tele Assessment Is this an Initial Assessment or a Re-assessment for this encounter?: Initial Assessment Marital status: Single Is patient pregnant?: No Pregnancy Status: No Living Arrangements: Parent (Lives with mother.) Can pt return to current living arrangement?: Yes Admission Status: Voluntary Is patient capable of signing voluntary admission?: Yes Referral Source: Self/Family/Friend (Mother brought patient after she took overdose.) Insurance type: self pay     Crisis Care Plan Living Arrangements: Parent (Lives with mother.) Name of Psychiatrist: None Name of Therapist: None  Education Status Is patient currently in school?: Yes Name of school: [employer]  Risk to self with the past 6 months Suicidal Ideation: No-Not Currently/Within Last 6 Months Has patient been a risk to self within the past 6 months prior to admission? : Yes Suicidal Intent: No-Not Currently/Within Last 6 Months Has patient had any suicidal intent within the past 6 months prior to admission? : Yes Is patient at risk for suicide?: Yes Suicidal Plan?: Yes-Currently Present Has patient had any suicidal plan within the past 6 months prior to admission? : Yes Specify Current Suicidal Plan: Overdose  Access to Means: Yes Specify Access to Suicidal Means: Medications What has been your use of drugs/alcohol within the last 12 months?: None currently Previous Attempts/Gestures: Yes How many times?: 1 Other Self Harm Risks: None Triggers for Past Attempts: Unpredictable Intentional Self Injurious Behavior: None Family Suicide History: No Recent stressful life event(s): Financial Problems, Turmoil (Comment) Persecutory voices/beliefs?: No Depression: Yes Depression Symptoms: Despondent, Insomnia, Isolating, Loss of interest in usual pleasures, Feeling worthless/self pity, Tearfulness Substance abuse history and/or treatment for substance  abuse?: No Suicide prevention information given to non-admitted patients: Not applicable  Risk to Others within the past 6 months Homicidal Ideation: No Does patient have any lifetime risk of violence toward others beyond the six months prior to admission? : No Thoughts of Harm to Others: No Current Homicidal Intent: No Current Homicidal Plan: No Access to Homicidal Means: No Identified Victim: No one History of harm to others?: No Assessment of Violence: None Noted Violent Behavior Description: None reported Does patient have access to weapons?: No Criminal Charges Pending?: No Does patient have a court date: No Is patient on probation?: No  Psychosis Hallucinations: None noted Delusions: None noted  Mental Status Report Appearance/Hygiene: Unremarkable, In scrubs Eye Contact: Good Motor Activity: Freedom of movement, Unremarkable Speech: Logical/coherent Level of Consciousness: Alert Mood: Depressed, Helpless, Anxious Affect: Sad, Depressed Anxiety Level: Moderate Thought Processes: Coherent, Relevant Judgement: Unimpaired Orientation: Person, Place, Situation, Time Obsessive Compulsive Thoughts/Behaviors: None  Cognitive Functioning Concentration: Decreased Memory: Recent Intact, Remote Intact IQ: Average Insight: Fair Impulse Control: Poor Appetite: Poor Weight Loss: 0 Weight Gain: 0 Sleep: No Change Total Hours of Sleep: 7 Vegetative Symptoms: Decreased grooming, Staying in bed  ADLScreening [facility] Assessment Services) Patient's cognitive ability adequate to safely complete daily activities?: Yes Patient able to express need for assistance with ADLs?: Yes Independently performs ADLs?: Yes (appropriate for developmental age)  Prior Inpatient Therapy Prior Inpatient Therapy: Yes Prior Therapy Dates: 2016 Prior Therapy Facilty/Provider(s):  [facility] in [place] Reason for Treatment: OD  Prior Outpatient Therapy Prior Outpatient Therapy: Yes Prior  Therapy Dates: 2015 to June '17 Prior Therapy Facilty/Provider(s): [place] in [place] Reason for Treatment: psychiatry Does patient have an ACCT team?: No Does patient have Intensive In-House Services?  : No Does patient have [facility] services? : No Does patient have P4CC services?: No  ADL Screening (condition at time of admission) Patient's cognitive ability adequate to safely complete daily activities?: Yes Is the patient deaf or have difficulty hearing?: No Does the patient have difficulty seeing, even when wearing glasses/contacts?: No (Wears glasses.) Does the patient have difficulty concentrating, remembering, or making decisions?: Yes Patient able to express need for assistance with ADLs?: Yes Does the patient have difficulty dressing or bathing?: No Independently performs ADLs?: Yes (appropriate for developmental age) Does the patient have difficulty walking or climbing stairs?: No Weakness of Legs: None Weakness of Arms/Hands: None       Abuse/Neglect Assessment (Assessment to be complete while patient is alone) Physical Abuse: Denies Verbal Abuse: Denies Sexual Abuse: Denies Exploitation of patient/patient's resources: Denies Self-Neglect: Denies     Merchant navy officer (For Healthcare) Does Patient Have a Medical Advance Directive?: No Would patient like information on creating a medical advance directive?: No - Patient declined    Additional Information 1:1 In Past 12 Months?: No CIRT Risk: No Elopement Risk: No Does patient have medical clearance?: Yes     Disposition:  Disposition Initial Assessment Completed for this Encounter: Yes Disposition of Patient: Other dispositions Other disposition(s):  (Pt to be reviewed by FNP)  This service was provided via telemedicine using a 2-way, interactive audio and video technology.  Names of all persons participating in this telemedicine service and their role in this encounter. Name: Glenis Smoker Role: mother   Name:  Role:   Name:  Role:   Name:  Role:     Alexandria Lodge 02/27/2017 9:16 PM

## 2017-02-27 NOTE — ED Notes (Signed)
Pt called out, c/o nausea, verbal order obtained

## 2017-02-27 NOTE — ED Notes (Signed)
[facility] at bedside for transport.  

## 2017-02-27 NOTE — ED Notes (Signed)
Patient into scrubs, security called for wanding, aware of poc.

## 2017-02-27 NOTE — ED Notes (Signed)
Patient wanded by security. Patients belongings inventoried. Paper work in [employer], belongings in locker #2.

## 2017-02-27 NOTE — ED Notes (Signed)
Called [facility] for transportation.  

## 2017-02-27 NOTE — ED Notes (Signed)
Pt belongings in locker #3. Pt mother has pt cell phone.

## 2017-02-28 ENCOUNTER — Encounter (HOSPITAL_COMMUNITY): Payer: Self-pay | Admitting: *Deleted

## 2017-02-28 DIAGNOSIS — G4700 Insomnia, unspecified: Secondary | ICD-10-CM

## 2017-02-28 DIAGNOSIS — T39312A Poisoning by propionic acid derivatives, intentional self-harm, initial encounter: Secondary | ICD-10-CM

## 2017-02-28 DIAGNOSIS — F411 Generalized anxiety disorder: Secondary | ICD-10-CM

## 2017-02-28 DIAGNOSIS — F322 Major depressive disorder, single episode, severe without psychotic features: Secondary | ICD-10-CM | POA: Insufficient documentation

## 2017-02-28 DIAGNOSIS — T1491XA Suicide attempt, initial encounter: Secondary | ICD-10-CM

## 2017-02-28 DIAGNOSIS — Z566 Other physical and mental strain related to work: Secondary | ICD-10-CM

## 2017-02-28 DIAGNOSIS — F333 Major depressive disorder, recurrent, severe with psychotic symptoms: Secondary | ICD-10-CM

## 2017-02-28 DIAGNOSIS — T391X2A Poisoning by 4-Aminophenol derivatives, intentional self-harm, initial encounter: Secondary | ICD-10-CM

## 2017-02-28 DIAGNOSIS — R45851 Suicidal ideations: Secondary | ICD-10-CM

## 2017-02-28 DIAGNOSIS — R5383 Other fatigue: Secondary | ICD-10-CM

## 2017-02-28 DIAGNOSIS — R443 Hallucinations, unspecified: Secondary | ICD-10-CM

## 2017-02-28 HISTORY — DX: Generalized anxiety disorder: F41.1

## 2017-02-28 HISTORY — DX: Insomnia, unspecified: G47.00

## 2017-02-28 LAB — HEMOGLOBIN A1C
Hgb A1c MFr Bld: 5.1 % (ref 4.8–5.6)
MEAN PLASMA GLUCOSE: 99.67 mg/dL

## 2017-02-28 LAB — TSH: TSH: 2.394 u[IU]/mL (ref 0.350–4.500)

## 2017-02-28 MED ORDER — MAGNESIUM HYDROXIDE 400 MG/5ML PO SUSP: 30.0000 mL | Freq: Every day | ORAL | Status: DC | PRN

## 2017-02-28 MED ORDER — HYDROXYZINE HCL 25 MG PO TABS
25.0000 mg | ORAL_TABLET | Freq: Three times a day (TID) | ORAL | Status: DC | PRN
  Administered 2017-02-28 – 2017-03-01 (×2): 25 mg via ORAL
  Filled 2017-02-28: qty 1
  Filled 2017-02-28: qty 10
  Filled 2017-02-28: qty 1

## 2017-02-28 MED ORDER — VENLAFAXINE HCL ER 37.5 MG PO CP24
37.5000 mg | ORAL_CAPSULE | Freq: Every day | ORAL | Status: DC
  Administered 2017-02-28 – 2017-03-01 (×2): 37.5 mg via ORAL
  Filled 2017-02-28 (×4): qty 1

## 2017-02-28 MED ORDER — ALUM & MAG HYDROXIDE-SIMETH 200-200-20 MG/5ML PO SUSP: 30.0000 mL | ORAL | Status: DC | PRN

## 2017-02-28 MED ORDER — ONDANSETRON 4 MG PO TBDP
4.0000 mg | ORAL_TABLET | Freq: Three times a day (TID) | ORAL | Status: DC | PRN
  Administered 2017-02-28: 4 mg via ORAL
  Filled 2017-02-28: qty 1

## 2017-02-28 MED ORDER — TRAZODONE HCL 50 MG PO TABS
50.0000 mg | ORAL_TABLET | Freq: Every evening | ORAL | Status: DC | PRN
  Administered 2017-02-28 – 2017-03-01 (×2): 50 mg via ORAL
  Filled 2017-02-28: qty 1
  Filled 2017-02-28: qty 7
  Filled 2017-02-28: qty 1

## 2017-02-28 MED ORDER — ACETAMINOPHEN 325 MG PO TABS: 650.0000 mg | ORAL_TABLET | Freq: Four times a day (QID) | ORAL | Status: DC | PRN

## 2017-02-28 NOTE — Progress Notes (Signed)
Pt is a 21 year old female admitted voluntarily to [facility] after she attempted suicide.  She overdosed on Ibuprofen and Tylenol (see previous notes) on 02/26/17.  Pt presents with depressed affect and mood.  She denies SI during assessment.  Denies HI, hallucinations, and pain.  Denies history of abuse, drug use, alcohol use, and tobacco use.  Reports stressors as "school, work."  Pt also reports recent break up with boyfriend "but we're still talking."  Pt attempted suicide by overdose in 2016.  She is a Consulting civil engineer at [employer].  Pt had episode of emesis during admission assessment.  She reports she has been "throwing up all day every time I eat something."    Introduced self to pt.  Admission process and paperwork completed.  Admission orders obtained.  PRN medication for nausea and vomiting offered, pt declined.  Non-invasive body assessment completed by female RN, unremarkable.  Belongings searched for contraband, items not allowed on unit are in locker 22.  Pt provided with ginger ale.  Pt oriented to unit and unit routine.  Q15 minute safety checks are being performed.    Pt is cooperative with admission process.  She verbally contracts for safety and reports she will inform staff of needs and concerns.  Pt is safe on the unit.  Will continue to monitor and assess.

## 2017-02-28 NOTE — Progress Notes (Signed)
Data. Patient denies SI/HI/AVH. Verbally contracts for safety on the unit and to come to staff before acting of any self harm thoughts/feelings. Patient has had one episode of emesis this AM-observed- wnl. Patient continues to C/O nausea. Medications given and patient encouraged to drink small sips and eat dry crackers. Patient did not attend lunch. Patient interacting well with staff and other patients. Affect is flat be does brighten with interaction. Action. Emotional support and encouragement offered. Education provided on medication, indications and side effect. Q 15 minute checks done for safety. Response. Safety on the unit maintained through 15 minute checks.  Medications taken as prescribed. Attended groups. Remained calm and appropriate through out shift.

## 2017-02-28 NOTE — Tx Team (Signed)
Initial Treatment Plan 02/28/2017 12:33 AM [name] ZOX:[id number]    PATIENT STRESSORS: Educational concerns Occupational concerns   PATIENT STRENGTHS: Active sense of humor Average or above average intelligence Communication skills General fund of knowledge Physical Health Supportive family/friends   PATIENT IDENTIFIED PROBLEMS: SI  depression    "to be able to control my depression"  "to become myself again"              DISCHARGE CRITERIA:  Improved stabilization in mood, thinking, and/or behavior  PRELIMINARY DISCHARGE PLAN: Outpatient therapy  PATIENT/FAMILY INVOLVEMENT: This treatment plan has been presented to and reviewed with the patient, Jenna Henry.  The patient and family have been given the opportunity to ask questions and make suggestions.  Arrie Aran, [place] 02/28/2017, 12:33 AM

## 2017-02-28 NOTE — H&P (Signed)
Psychiatric Admission Assessment Adult  Patient Identification: Jenna Henry MRN:  [id number] Date of Evaluation:  02/28/2017 Chief Complaint:  MDD Principal Diagnosis: MDD (major depressive disorder), recurrent, severe, with psychosis (HCC) Diagnosis:   Patient Active Problem List   Diagnosis Date Noted  . Severe major depression without psychotic features (HCC) [F32.2] 02/28/2017  . MDD (major depressive disorder), recurrent, severe, with psychosis (HCC) [F33.3] 02/28/2017  . Suicide attempt (HCC) [T14.91XA] 02/28/2017  . Anxiety state [F41.1] 02/28/2017  . Insomnia [G47.00] 02/28/2017  . Herpes [B00.9] 11/12/2016  . Irregular menstruation [N92.6] 07/06/2016   History of Present Illness: Chart reviewed and face to face evaluation on 02/28/17.   On evaluation:  Hollis Oh reports that she was feeling overwhelmed with working full time and going to school.  States that she was having a worsening in depression and not feeling herself and took an overdose of Ibuprofen and Tylenol.  Patient states prior history of suicide attempt in 2016 via overdose and was [place] [facility] [place]  Denies history of self injurious behavior.  At current time patient rates depression 6/10, anxiety 5/10 and continues to have visual hallucinations seeing people in black/white and paranoia that someone is following her.  She denies verbal hallucinations.  Patient also denies homicidal ideation.  Patient is able to contract for safety on the unit.    Associated Signs/Symptoms: Depression Symptoms:  anhedonia, insomnia, fatigue, feelings of worthlessness/guilt, difficulty concentrating, suicidal attempt, anxiety, loss of energy/fatigue, (Hypo) Manic Symptoms:  Hallucinations, Impulsivity, Irritable Mood, Anxiety Symptoms:  Denies Psychotic Symptoms:  Hallucinations: Visual PTSD Symptoms: Denies Total Time spent with patient: 45 minutes  Past Psychiatric History: Prior suicide attempt in 2016 and was  hospitalized.  Was on Effexor up until a year ago when she stopped.   Is the patient at risk to self? Yes.    Has the patient been a risk to self in the past 6 months? No.  Has the patient been a risk to self within the distant past? Yes.    Is the patient a risk to others? No.  Has the patient been a risk to others in the past 6 months? No.  Has the patient been a risk to others within the distant past? No.   Prior Inpatient Therapy:  Yes in [place]  Prior Outpatient Therapy:  Yes in [place]  Alcohol Screening: 1. How often do you have a drink containing alcohol?: Never 9. Have you or someone else been injured as a result of your drinking?: No 10. Has a relative or friend or a doctor or another health worker been concerned about your drinking or suggested you cut down?: No Alcohol Use Disorder Identification Test Final Score (AUDIT): 0 Brief Intervention: AUDIT score less than 7 or less-screening does not suggest unhealthy drinking-brief intervention not indicated Substance Abuse History in the last 12 months:  Yes.   Consequences of Substance Abuse: Denies any trouble or family discord Previous Psychotropic Medications: Yes, Effexor; which she states wortked well for her Psychological Evaluations: No  Past Medical History:  Past Medical History:  Diagnosis Date  . Anxiety   . Asthma   . Depression   . Herpes   . Polycystic ovaries     Past Surgical History:  Procedure Laterality Date  . CYSTECTOMY     Family History:  Family History  Problem Relation Age of Onset  . Hypertension Mother    Family Psychiatric  History: Mother/Depression/Anxiety Tobacco Screening: Have you used any form of tobacco in  the last 30 days? (Cigarettes, Smokeless Tobacco, Cigars, and/or Pipes): No Social History:  History  Alcohol Use No     History  Drug Use No    Additional Social History:      Pain Medications: overdosed on tylenol and ibuprofen Prescriptions: denies Over  the Counter: denies History of alcohol / drug use?: No history of alcohol / drug abuse                    Allergies:   Allergies  Allergen Reactions  . Ortho Tri-Cyclen [Norgestimate-Eth Estradiol] Hives, Itching, Rash and Other (See Comments)    "Burning all over"   Lab Results:  Results for orders placed or performed during the hospital encounter of 02/27/17 (from the past 48 hour(s))  TSH     Status: None   Collection Time: 02/28/17  7:31 AM  Result Value Ref Range   TSH 2.394 0.350 - 4.500 uIU/mL    Comment: Performed by a 3rd Generation assay with a functional sensitivity of <=0.01 uIU/mL. Performed at [facility], [address] [place], [place] [address]     Blood Alcohol level:  Lab Results  Component Value Date   ETH <10 02/27/2017    Metabolic Disorder Labs:  Lab Results  Component Value Date   HGBA1C 5.5 01/22/2017   MPG 97 07/03/2016   Lab Results  Component Value Date   PROLACTIN 8.3 07/06/2016   PROLACTIN 8.5 07/03/2016   No results found for: CHOL, TRIG, HDL, CHOLHDL, VLDL, LDLCALC  Current Medications: Current Facility-Administered Medications  Medication Dose Route Frequency Provider Last Rate Last Dose  . acetaminophen (TYLENOL) tablet 650 mg  650 mg Oral Q6H PRN Jackelyn Poling, NP      . alum & mag hydroxide-simeth (MAALOX/MYLANTA) 200-200-20 MG/5ML suspension 30 mL  30 mL Oral Q4H PRN Nira Conn A, NP      . hydrOXYzine (ATARAX/VISTARIL) tablet 25 mg  25 mg Oral TID PRN Nira Conn A, NP      . magnesium hydroxide (MILK OF MAGNESIA) suspension 30 mL  30 mL Oral Daily PRN Nira Conn A, NP      . ondansetron (ZOFRAN-ODT) disintegrating tablet 4 mg  4 mg Oral Q8H PRN Nira Conn A, NP   4 mg at 02/28/17 0841  . traZODone (DESYREL) tablet 50 mg  50 mg Oral QHS PRN Nira Conn A, NP      . venlafaxine XR (EFFEXOR-XR) 24 hr capsule 37.5 mg  37.5 mg Oral Q breakfast Rankin, Shuvon B, NP       PTA  Medications: Prescriptions Prior to Admission  Medication Sig Dispense Refill Last Dose  . albuterol (PROAIR HFA) 108 (90 Base) MCG/ACT inhaler Inhale 2 puffs into the lungs every 6 (six) hours as needed for wheezing or shortness of breath.   Past Week at Unknown time  . ibuprofen (ADVIL,MOTRIN) 600 MG tablet Take 1 tablet (600 mg total) by mouth every 6 (six) hours as needed (Take with food.). (Patient not taking: Reported on 02/27/2017) 30 tablet 0 Not Taking at Unknown time  . levonorgestrel-ethinyl estradiol (AVIANE,ALESSE,LESSINA) 0.1-20 MG-MCG tablet Take 1 tablet by mouth daily. (Patient not taking: Reported on 02/27/2017) 1 Package 11 Not Taking at Unknown time  . meclizine (ANTIVERT) 32 MG tablet Take 1 tablet (32 mg total) by mouth 3 (three) times daily as needed. (Patient not taking: Reported on 02/27/2017) 30 tablet 0 Not Taking at Unknown time  . metroNIDAZOLE (FLAGYL) 500 MG  tablet Take 1 tablet (500 mg total) by mouth 2 (two) times daily. (Patient taking differently: Take 500 mg by mouth 2 (two) times daily. FOR 7 DAYS) 14 tablet 0 02/27/2017 at am  . ondansetron (ZOFRAN ODT) 8 MG disintegrating tablet Take 1 tablet (8 mg total) by mouth every 8 (eight) hours as needed for nausea or vomiting. (Patient not taking: Reported on 02/27/2017) 20 tablet 0 Not Taking at Unknown time  . Prenatal Multivit-Min-Fe-FA (PRENATAL VITAMINS) 0.8 MG tablet Take 1 tablet by mouth daily. 30 tablet 12 02/26/2017 at am  . valACYclovir (VALTREX) 500 MG tablet Take 1 tablet (500 mg total) by mouth daily. 30 tablet 6 02/27/2017 at am    Musculoskeletal: Strength & Muscle Tone: within normal limits Gait & Station: normal Patient leans: N/A  Psychiatric Specialty Exam: Physical Exam  Nursing note and vitals reviewed. Constitutional: She is oriented to person, place, and time.  Neck: Normal range of motion.  Respiratory: Effort normal.  Musculoskeletal: Normal range of motion.  Neurological: She is alert and  oriented to person, place, and time.  Skin: Skin is warm and dry.  Psychiatric: Her speech is normal. Her mood appears anxious. She is actively hallucinating (visual). Cognition and memory are normal. She expresses impulsivity. She exhibits a depressed mood. She expresses suicidal ideation. She expresses suicidal plans.    Review of Systems  Respiratory: Negative for cough, hemoptysis, sputum production, shortness of breath and wheezing.        History of asthma  Gastrointestinal: Positive for abdominal pain, nausea and vomiting. Negative for blood in stool, constipation and diarrhea.       States that she started to have N/V yesterday and abdominal pain.  Denies diarrhea and constipation  Musculoskeletal: Negative.   Psychiatric/Behavioral: Positive for depression (6/10), hallucinations (States that she is seeing people in black and white and feels that some one is following her but really not), memory loss, substance abuse (THC about 3 times a month) and suicidal ideas (Reports that she took overdose). The patient is nervous/anxious (5/10 ) and has insomnia (Hard to fall to sleep but once sleep she sleeps through the night  ).   All other systems reviewed and are negative.   Blood pressure 115/79, pulse 60, temperature 98.5 F (36.9 C), temperature source Oral, resp. rate (!) 24, height 5' 5.75" (1.67 m), weight 86.2 kg (190 lb).Body mass index is 30.9 kg/m.  General Appearance: Casual  Eye Contact:  Good  Speech:  Clear and Coherent and Normal Rate  Volume:  Normal  Mood:  Anxious and Depressed  Affect:  Congruent and Depressed  Thought Process:  Coherent and Goal Directed  Orientation:  Full (Time, Place, and Person)  Thought Content:  Hallucinations: Visual  Suicidal Thoughts:  Yes.  with intent/plan  Homicidal Thoughts:  No  Memory:  Immediate;   Good Recent;   Good Remote;   Good  Judgement:  Fair  Insight:  Fair  Psychomotor Activity:  Normal  Concentration:  Concentration:  Fair and Attention Span: Fair  Recall:  Good  Fund of Knowledge:  Fair  Language:  Good  Akathisia:  No  Handed:  Right  AIMS (if indicated):     Assets:  Communication Skills Desire for Improvement Housing Physical Health Resilience Social Support Transportation Vocational/Educational  ADL's:  Intact  Cognition:  WNL  Sleep:  Number of Hours: 4.5    Treatment Plan Summary: Daily contact with patient to assess and evaluate symptoms and progress in treatment  and Medication management  Observation Level/Precautions:  15 minute checks  Laboratory:  CBC Chemistry Profile Waiting results HgbA1c, Lipid panel; Will order uren pregnancy  Psychotherapy:  Indivisual and group sessions  Medications:  Will restart Effexor XR 37.5 mg for Major Depression; Continue Vistaril 25 mg Tid prn for anxiety; Continue Trazodone 50 mg Q hs prn for Insomnia  Consultations:  As appropriate  Discharge Concerns:  Safety, stabilization, and access to medication  Estimated LOS: 5-7 days  Other:     Physician Treatment Plan for Primary Diagnosis: MDD (major depressive disorder), recurrent, severe, with psychosis (HCC) Long Term Goal(s): Improvement in symptoms so as ready for discharge  Short Term Goals: Ability to identify changes in lifestyle to reduce recurrence of condition will improve, Ability to verbalize feelings will improve, Ability to disclose and discuss suicidal ideas, Ability to identify and develop effective coping behaviors will improve and Compliance with prescribed medications will improve  Physician Treatment Plan for Secondary Diagnosis: Principal Problem:   MDD (major depressive disorder), recurrent, severe, with psychosis (HCC) Active Problems:   Suicide attempt (HCC)   Anxiety state   Insomnia  Long Term Goal(s): Improvement in symptoms so as ready for discharge  Short Term Goals: Ability to identify changes in lifestyle to reduce recurrence of condition will improve, Ability  to disclose and discuss suicidal ideas, Ability to demonstrate self-control will improve, Ability to maintain clinical measurements within normal limits will improve, Compliance with prescribed medications will improve and Ability to identify triggers associated with substance abuse/mental health issues will improve  I certify that inpatient services furnished can reasonably be expected to improve the patient's condition.    Rankin, Denice Bors, NP 9/30/20189:29 AM

## 2017-02-28 NOTE — BHH Counselor (Signed)
Adult Comprehensive Assessment  Patient ID: Sabella Traore, female   DOB: 08-20-1995, 21 Y.Jenetta Downer   MRN: [id number]  Information Source: Information source: Patient  Current Stressors:  Educational / Learning stressors: College is challenging; especially the accounting classes Employment / Job issues: Can be difficult going to school then straight to work Family Relationships: Some strains in relationship with significant other Museum/gallery curator / Lack of resources (include bankruptcy): NA Housing / Lack of housing: NA Physical health (include injuries & life threatening diseases): NA Social relationships: NA Substance abuse: NA Bereavement / Loss: NA  Living/Environment/Situation:  Living Arrangements: Parent Living conditions (as described by patient or guardian): Good with mom; patient has her own room and all her needs are met How long has patient lived in current situation?: 88 years w mom; 14 months in [place] What is atmosphere in current home: Comfortable, Loving, Supportive  Family History:  Marital status: Single Are you sexually active?: Yes What is your sexual orientation?: Heterosexual Has your sexual activity been affected by drugs, alcohol, medication, or emotional stress?: 'maybe' Does patient have children?: No  Childhood History:  By whom was/is the patient raised?: Mother Additional childhood history information: Biological father in and out of patient's life; mostly out Description of patient's relationship with caregiver when they were a child: Good w Mom Patient's description of current relationship with people who raised him/her: Remains close to mom; describes her as best friend How were you disciplined when you got in trouble as a child/adolescent?: Grounded; appropriate punishment Does patient have siblings?: No Did patient suffer any verbal/emotional/physical/sexual abuse as a child?: No Did patient suffer from severe childhood neglect?: No Has patient ever been sexually  abused/assaulted/raped as an adolescent or adult?: No Was the patient ever a victim of a crime or a disaster?: No Witnessed domestic violence?: No Has patient been effected by domestic violence as an adult?: No  Education:  Highest grade of school patient has completed: 25 Currently a student?: Yes If yes, how has current illness impacted academic performance: Sometimes feel overwhelmed and do not do the work Name of school: [employer], engineering person: Self How long has the patient attended?: one year Learning disability?: No  Employment/Work Situation:   Employment situation: Ship broker Where is patient currently employed?: [employer] long has patient been employed?: 1 year Patient's job has been impacted by current illness: No What is the longest time patient has a held a job?: current job Has patient ever been in the [employer]?: No Are There Guns or Other Weapons in [place]?: No  Financial Resources:   Museum/gallery curator resources: Support from parents / caregiver, Income from employment Does patient have a Programmer, applications or guardian?: No  Alcohol/Substance Abuse:   What has been your use of drugs/alcohol within the last 12 months?: THC two to three times per month Alcohol/Substance Abuse Treatment Hx: Denies past history Has alcohol/substance abuse ever caused legal problems?: No  Social Support System:   Pensions consultant Support System: [place]: Mother and boyfriend Type of faith/religion: Darrick Meigs How does patient's faith help to cope with current illness?: Attends services occasionally  Leisure/Recreation:   Leisure and Hobbies: Walks in nature, writing and music  Strengths/Needs:   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 In what areas does patient struggle / problems for patient: Patient became noncompliant with medication and follow up 14 months ago when she moved to [facility]  Discharge Plan:    Does  patient have access to transportation?: Yes Will patient be returning to same living situation after discharge?: Yes Currently receiving community mental health services: No If no, would patient like referral for services when discharged?: Yes (What county?) Sports coach) Does patient have financial barriers related to discharge medications?: Yes Patient description of barriers related to discharge medications: Lack of insurance  Summary/Recommendations:   Summary and Recommendations (to be completed by the evaluator): Patient is 21 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 Patient had a previous overdose attempt in [place] in 2016 yet discontinued medication and therapy upon move to [facility] in July of 2017. 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 Patient will benefit from crisis stabilization, medication evaluation, group therapy and psycho education, in addition to case management for discharge planning. At discharge it is recommended that patient adhere to the established discharge plan and continue in treatment.  Sheilah Pigeon. 02/28/2017

## 2017-02-28 NOTE — BHH Group Notes (Signed)
[facility] LCSW Group Therapy Note  Date/Time:  02/28/2017 10:00-11:00AM  Type of Therapy and Topic:  Group Therapy:  Healthy and Unhealthy Supports  Participation Level:  Active   Description of Group:  Patients in this group were introduced to the idea of adding a variety of healthy supports to address the various needs in their lives. The picture on the front of Sunday's workbook was used to demonstrate why more supports are needed in every patient's life.  Patients identified and described healthy supports versus unhealthy supports in general, then gave examples of each in their own lives.   They discussed what additional healthy supports could be helpful in their recovery and wellness after discharge in order to prevent future hospitalizations.   An emphasis was placed on using counselor, doctor, therapy groups, 12-step groups, and problem-specific support groups to expand supports.  They also worked as a group on developing a specific plan for several patients to deal with unhealthy supports through boundary-setting, psychoeducation with loved ones, and even termination of relationships.   Therapeutic Goals:   1)  discuss importance of adding supports to stay well once out of the hospital  2)  compare healthy versus unhealthy supports and identify some examples of each  3)  generate ideas and descriptions of healthy supports that can be added  4)  offer mutual support about how to address unhealthy supports  5)  encourage active participation in and adherence to discharge plan    Summary of Patient Progress:  The patient shared that the current healthy/unhealthy supports available in their life are her mother and boyfriend (healthy) and none (unhealthy).  Through the course of the group, she was able to acknowledge that there are times she herself may not be a healthy support for herself.   Therapeutic Modalities:   Motivational Interviewing Brief Solution-Focused Therapy  Ambrose Mantle, LCSW 02/28/2017, 12:54 PM

## 2017-02-28 NOTE — Progress Notes (Signed)
Adult Psychoeducational Group Note  Date:  02/28/2017 Time:  10:23 PM  Group Topic/Focus:  Wrap-Up Group:   The focus of this group is to help patients review their daily goal of treatment and discuss progress on daily workbooks.  Participation Level:  Active  Participation Quality:  Appropriate  Affect:  Appropriate  Cognitive:  Appropriate  Insight: Appropriate  Engagement in Group:  Engaged  Modes of Intervention:  Discussion  Additional Comments:  Pt stated her goal for today was to make it throughout the day without vomiting. Pt stated she accomplished her goal and felt good about it. Pt rated her over all day a 8 out of 10. Pt stated she attended all groups held today.   Felipa Furnace 02/28/2017, 10:23 PM

## 2017-02-28 NOTE — [facility] Suicide Risk Assessment (Signed)
[facility] Admission Suicide Risk Assessment   Nursing information obtained from:  Patient Demographic factors:  Adolescent or young adult Current Mental Status:  NA Loss Factors:  Loss of significant relationship Historical Factors:  Prior suicide attempts, Family history of mental illness or substance abuse, Impulsivity Risk Reduction Factors:  Employed, Living with another person, especially a relative, Positive coping skills or problem solving skills  Total Time spent with patient: 45 minutes Principal Problem: MDD (major depressive disorder), recurrent, severe, with psychosis (HCC) Diagnosis:   Patient Active Problem List   Diagnosis Date Noted  . Severe major depression without psychotic features (HCC) [F32.2] 02/28/2017  . MDD (major depressive disorder), recurrent, severe, with psychosis (HCC) [F33.3] 02/28/2017  . Suicide attempt (HCC) [T14.91XA] 02/28/2017  . Anxiety state [F41.1] 02/28/2017  . Insomnia [G47.00] 02/28/2017  . Herpes [B00.9] 11/12/2016  . Irregular menstruation [N92.6] 07/06/2016   Subjective Data:  21 year old female with depression, who presented after ingesting 3 800 mg ibuprofen and 8 Tylenol pills as a suicide attempt.   She states that she was very overwhelmed by school and working full time. She did not feel like herself, "losing myself" and overdosed medication. She feels better after admission and reports she had conversation with her mother. She regret she had not shared her feeling with her mother, although they are very close together. She also reports good relationship with her boyfriend. She reports AH of seeing somebody, which has been getting improved. She denies paranoia. She denies SI, HI. She reports she used to on Effexor, although she did not continue it as she was doing better without medication.    Continued Clinical Symptoms:  Alcohol Use Disorder Identification Test Final Score (AUDIT): 0 The "Alcohol Use Disorders Identification Test",  Guidelines for Use in Primary Care, Second Edition.  World Science writer [facility]). Score between 0-7:  no or low risk or alcohol related problems. Score between 8-15:  moderate risk of alcohol related problems. Score between 16-19:  high risk of alcohol related problems. Score 20 or above:  warrants further diagnostic evaluation for alcohol dependence and treatment.   CLINICAL FACTORS:   Depression:   Anhedonia   Musculoskeletal: Strength & Muscle Tone: within normal limits Gait & Station: normal Patient leans: N/A  Psychiatric Specialty Exam: Physical Exam  Nursing note and vitals reviewed.   Review of Systems  Psychiatric/Behavioral: Positive for depression and hallucinations. Negative for substance abuse and suicidal ideas. The patient is nervous/anxious. The patient does not have insomnia.   All other systems reviewed and are negative.   Blood pressure 115/79, pulse 60, temperature 98.5 F (36.9 C), temperature source Oral, resp. rate (!) 24, height 5' 5.75" (1.67 m), weight 190 lb (86.2 kg).Body mass index is 30.9 kg/m.  General Appearance: Fairly Groomed  Eye Contact:  Good  Speech:  Clear and Coherent  Volume:  Normal  Mood:  Depressed  Affect:  Appropriate, Congruent and down, slightly restricted  Thought Process:  Coherent and Goal Directed  Orientation:  Full (Time, Place, and Person)  Thought Content:  Logical Perceptions: VH of people, denies AH  Suicidal Thoughts:  No  Homicidal Thoughts:  No  Memory:  Immediate;   Good Recent;   Good Remote;   Good  Judgement:  Fair  Insight:  Fair  Psychomotor Activity:  Normal  Concentration:  Concentration: Good and Attention Span: Good  Recall:  Good  Fund of Knowledge:  Good  Language:  Good  Akathisia:  No  Handed:  Right  AIMS (if indicated):     Assets:  Communication Skills Desire for Improvement  ADL's:  Intact  Cognition:  WNL  Sleep:  Number of Hours: 4.5      COGNITIVE FEATURES THAT CONTRIBUTE  TO RISK:  None    SUICIDE RISK:   Minimal: No identifiable suicidal ideation.  Patients presenting with no risk factors but with morbid ruminations; may be classified as minimal risk based on the severity of the depressive symptoms  PLAN OF CARE: Patient will be admitted to inpatient psychiatric unit for stabilization and safety. Will provide and encourage milieu participation. Provide medication management and maked adjustments as needed.  Will follow daily.   I certify that inpatient services furnished can reasonably be expected to improve the patient's condition.   Neysa Hotter, MD 02/28/2017, 1:29 PM

## 2017-03-01 DIAGNOSIS — R4584 Anhedonia: Secondary | ICD-10-CM

## 2017-03-01 LAB — LIPID PANEL
Cholesterol: 147 mg/dL (ref 0–200)
HDL: 35 mg/dL — ABNORMAL LOW (ref >40)
LDL Cholesterol: 88 mg/dL (ref 0–99)
Total CHOL/HDL Ratio: 4.2 RATIO
Triglycerides: 118 mg/dL (ref <150)
VLDL: 24 mg/dL (ref 0–40)

## 2017-03-01 LAB — BASIC METABOLIC PANEL
Anion gap: 8 (ref 5–15)
BUN: 9 mg/dL (ref 6–20)
CALCIUM: 10.7 mg/dL — AB (ref 8.9–10.3)
CO2: 29 mmol/L (ref 22–32)
Chloride: 102 mmol/L (ref 101–111)
Creatinine, Ser: 0.87 mg/dL (ref 0.44–1.00)
GFR calc Af Amer: >60 mL/min (ref >60)
GLUCOSE: 98 mg/dL (ref 65–99)
Potassium: 3.5 mmol/L (ref 3.5–5.1)
Sodium: 139 mmol/L (ref 135–145)

## 2017-03-01 MED ORDER — VENLAFAXINE HCL ER 75 MG PO CP24
75.0000 mg | ORAL_CAPSULE | Freq: Every day | ORAL | Status: DC
  Administered 2017-03-02: 75 mg via ORAL
  Filled 2017-03-01: qty 1
  Filled 2017-03-01: qty 7
  Filled 2017-03-01: qty 1

## 2017-03-01 NOTE — [facility] Suicide Risk Assessment (Signed)
[facility] INPATIENT:  Family/Significant Other Suicide Prevention Education  Suicide Prevention Education:  Education Completed; Jenna Henry, mother, [phone],  (name of family member/significant other) has been identified by the patient as the family member/significant other with whom the patient will be residing, and identified as the person(s) who will aid the patient in the event of a mental health crisis (suicidal ideations/suicide attempt).  With written consent from the patient, the family member/significant other has been provided the following suicide prevention education, prior to the and/or following the discharge of the patient.  The suicide prevention education provided includes the following:  Suicide risk factors  Suicide prevention and interventions  National Suicide Hotline telephone number  [facility] assessment telephone number  [facility] Emergency Assistance 911  [facility] and/or Residential Mobile Crisis Unit telephone number  Request made of family/significant other to:  Remove weapons (e.g., guns, rifles, knives), all items previously/currently identified as safety concern.    Remove drugs/medications (over-the-counter, prescriptions, illicit drugs), all items previously/currently identified as a safety concern.  The family member/significant other verbalizes understanding of the suicide prevention education information provided.  The family member/significant other agrees to remove the items of safety concern listed above.  Mother has called and visited patient - "totally different than how she was when she went into the ER - she's back to herself."  Mother has no concerns about patient's discharge.  Was blindsided by patient's suicidality, but "I noticed that in the last week, she had been lashing out, extremely angry."  "She's a typical teenager, but this was extreme - yelling, raising her voice."  Wants patient to initiate conversation re  her needs and suicidal thinking.  No weapons in the home.  Medications have been secured in the home.  Mother prepared for patient to discharge when ready and will pick up patient.  Jenna Henry 03/01/2017, 2:40 PM

## 2017-03-01 NOTE — Progress Notes (Signed)
D: Pt denies SI/HI/AVH. Pt is pleasant and cooperative. Pt stated she was doing better do to her coping better . Pt said she was getting stressed between school / work.   A: Pt was offered support and encouragement. Pt was given scheduled medications. Pt was encourage to attend groups. Q 15 minute checks were done for safety.   R:Pt attends groups and interacts well with peers and staff. Pt is taking medication. Pt has no complaints.Pt receptive to treatment and safety maintained on unit.

## 2017-03-01 NOTE — Plan of Care (Signed)
Problem: Safety: Goal: Periods of time without injury will increase Outcome: Progressing No self harm injuries noted or observed. Pt free of any injuries.

## 2017-03-01 NOTE — Tx Team (Signed)
Interdisciplinary Treatment and Diagnostic Plan Update  03/01/2017 Time of Session: 9:00 AM [name] MRN: [id number]  Principal Diagnosis: MDD (major depressive disorder), recurrent, severe, with psychosis (HCC)  Secondary Diagnoses: Principal Problem:   MDD (major depressive disorder), recurrent, severe, with psychosis (HCC) Active Problems:   Suicide attempt (HCC)   Anxiety state   Insomnia   Current Medications:  Current Facility-Administered Medications  Medication Dose Route Frequency Provider Last Rate Last Dose  . acetaminophen (TYLENOL) tablet 650 mg  650 mg Oral Q6H PRN Nira Conn A, NP      . alum & mag hydroxide-simeth (MAALOX/MYLANTA) 200-200-20 MG/5ML suspension 30 mL  30 mL Oral Q4H PRN Nira Conn A, NP      . hydrOXYzine (ATARAX/VISTARIL) tablet 25 mg  25 mg Oral TID PRN Jackelyn Poling, NP   25 mg at 02/28/17 2223  . magnesium hydroxide (MILK OF MAGNESIA) suspension 30 mL  30 mL Oral Daily PRN Nira Conn A, NP      . ondansetron (ZOFRAN-ODT) disintegrating tablet 4 mg  4 mg Oral Q8H PRN Nira Conn A, NP   4 mg at 02/28/17 0841  . traZODone (DESYREL) tablet 50 mg  50 mg Oral QHS PRN Nira Conn A, NP   50 mg at 02/28/17 2223  . venlafaxine XR (EFFEXOR-XR) 24 hr capsule 37.5 mg  37.5 mg Oral Q breakfast Rankin, Shuvon B, NP   37.5 mg at 03/01/17 7425   PTA Medications: Prescriptions Prior to Admission  Medication Sig Dispense Refill Last Dose  . albuterol (PROAIR HFA) 108 (90 Base) MCG/ACT inhaler Inhale 2 puffs into the lungs every 6 (six) hours as needed for wheezing or shortness of breath.   Past Week at Unknown time  . ibuprofen (ADVIL,MOTRIN) 600 MG tablet Take 1 tablet (600 mg total) by mouth every 6 (six) hours as needed (Take with food.). (Patient not taking: Reported on 02/27/2017) 30 tablet 0 Not Taking at Unknown time  . levonorgestrel-ethinyl estradiol (AVIANE,ALESSE,LESSINA) 0.1-20 MG-MCG tablet Take 1 tablet by mouth daily. (Patient not taking:  Reported on 02/27/2017) 1 Package 11 Not Taking at Unknown time  . meclizine (ANTIVERT) 32 MG tablet Take 1 tablet (32 mg total) by mouth 3 (three) times daily as needed. (Patient not taking: Reported on 02/27/2017) 30 tablet 0 Not Taking at Unknown time  . metroNIDAZOLE (FLAGYL) 500 MG tablet Take 1 tablet (500 mg total) by mouth 2 (two) times daily. (Patient taking differently: Take 500 mg by mouth 2 (two) times daily. FOR 7 DAYS) 14 tablet 0 02/27/2017 at am  . ondansetron (ZOFRAN ODT) 8 MG disintegrating tablet Take 1 tablet (8 mg total) by mouth every 8 (eight) hours as needed for nausea or vomiting. (Patient not taking: Reported on 02/27/2017) 20 tablet 0 Not Taking at Unknown time  . Prenatal Multivit-Min-Fe-FA (PRENATAL VITAMINS) 0.8 MG tablet Take 1 tablet by mouth daily. 30 tablet 12 02/26/2017 at am  . valACYclovir (VALTREX) 500 MG tablet Take 1 tablet (500 mg total) by mouth daily. 30 tablet 6 02/27/2017 at am    Patient Stressors: Educational concerns Occupational concerns  Patient Strengths: Active sense of humor Average or above average intelligence Communication skills General fund of knowledge Physical Health Supportive family/friends  Treatment Modalities: Medication Management, Group therapy, Case management,  1 to 1 session with clinician, Psychoeducation, Recreational therapy.   Physician Treatment Plan for Primary Diagnosis: MDD (major depressive disorder), recurrent, severe, with psychosis (HCC) Long Term Goal(s): Improvement in symptoms so as ready  for discharge Improvement in symptoms so as ready for discharge   Short Term Goals: Ability to identify changes in lifestyle to reduce recurrence of condition will improve Ability to verbalize feelings will improve Ability to disclose and discuss suicidal ideas Ability to identify and develop effective coping behaviors will improve Compliance with prescribed medications will improve Ability to identify changes in lifestyle  to reduce recurrence of condition will improve Ability to disclose and discuss suicidal ideas Ability to demonstrate self-control will improve Ability to maintain clinical measurements within normal limits will improve Compliance with prescribed medications will improve Ability to identify triggers associated with substance abuse/mental health issues will improve  Medication Management: Evaluate patient's response, side effects, and tolerance of medication regimen.  Therapeutic Interventions: 1 to 1 sessions, Unit Group sessions and Medication administration.  Evaluation of Outcomes: Progressing  Physician Treatment Plan for Secondary Diagnosis: Principal Problem:   MDD (major depressive disorder), recurrent, severe, with psychosis (HCC) Active Problems:   Suicide attempt (HCC)   Anxiety state   Insomnia  Long Term Goal(s): Improvement in symptoms so as ready for discharge Improvement in symptoms so as ready for discharge   Short Term Goals: Ability to identify changes in lifestyle to reduce recurrence of condition will improve Ability to verbalize feelings will improve Ability to disclose and discuss suicidal ideas Ability to identify and develop effective coping behaviors will improve Compliance with prescribed medications will improve Ability to identify changes in lifestyle to reduce recurrence of condition will improve Ability to disclose and discuss suicidal ideas Ability to demonstrate self-control will improve Ability to maintain clinical measurements within normal limits will improve Compliance with prescribed medications will improve Ability to identify triggers associated with substance abuse/mental health issues will improve     Medication Management: Evaluate patient's response, side effects, and tolerance of medication regimen.  Therapeutic Interventions: 1 to 1 sessions, Unit Group sessions and Medication administration.  Evaluation of Outcomes:  Progressing   RN Treatment Plan for Primary Diagnosis: MDD (major depressive disorder), recurrent, severe, with psychosis (HCC) Long Term Goal(s): Knowledge of disease and therapeutic regimen to maintain health will improve  Short Term Goals: Ability to participate in decision making will improve and Ability to verbalize feelings will improve  Medication Management: RN will administer medications as ordered by provider, will assess and evaluate patient's response and provide education to patient for prescribed medication. RN will report any adverse and/or side effects to prescribing provider.  Therapeutic Interventions: 1 on 1 counseling sessions, Psychoeducation, Medication administration, Evaluate responses to treatment, Monitor vital signs and CBGs as ordered, Perform/monitor CIWA, COWS, AIMS and Fall Risk screenings as ordered, Perform wound care treatments as ordered.  Evaluation of Outcomes: Progressing   LCSW Treatment Plan for Primary Diagnosis: MDD (major depressive disorder), recurrent, severe, with psychosis (HCC) Long Term Goal(s): Safe transition to appropriate next level of care at discharge, Engage patient in therapeutic group addressing interpersonal concerns.  Short Term Goals: Engage patient in aftercare planning with referrals and resources, Identify triggers associated with mental health/substance abuse issues and Increase skills for wellness and recovery  Therapeutic Interventions: Assess for all discharge needs, 1 to 1 time with Social worker, Explore available resources and support systems, Assess for adequacy in community support network, Educate family and significant other(s) on suicide prevention, Complete Psychosocial Assessment, Interpersonal group therapy.  Evaluation of Outcomes: Progressing   Progress in Treatment: Attending groups: Yes. Participating in groups: Yes. Taking medication as prescribed: Yes. Toleration medication: Yes. Family/Significant  other contact made:  No, will contact:  mother Patient understands diagnosis: Yes. Discussing patient identified problems/goals with staff: Yes. Medical problems stabilized or resolved: Yes. Denies suicidal/homicidal ideation: Yes. and As evidenced by:  contracts for safety on unit, admitted after overdose, coping skills inadequate for community stressors Issues/concerns per patient self-inventory: No. Other: NA  New problem(s) identified: Yes, Describe:  needs link to therapist for outpatient care  New Short Term/Long Term Goal(s):  "come back to myuself, decrease irritability, increase coping skills, medications to help me"  Discharge Plan or Barriers: return home, follow up outpatient  Reason for Continuation of Hospitalization: Depression Medication stabilization Suicidal ideation  Estimated Length of Stay:  3 -5 days, 03/03/17  Attendees: Patient: Jenna Henry 03/01/2017 8:55 AM  Physician: Sallyanne Havers MD 03/01/2017 8:55 AM  Nursing: Achilles Dunk, RN, Christa RN 03/01/2017 8:55 AM  RN Care Manager: 03/01/2017 8:55 AM  Social Worker: Governor Rooks LCSW 03/01/2017 8:55 AM  Recreational Therapist:  03/01/2017 8:55 AM  Other:  03/01/2017 8:55 AM  Other:  03/01/2017 8:55 AM  Other: 03/01/2017 8:55 AM    Scribe for Treatment Team: Sallee Lange, LCSW 03/01/2017 8:55 AM

## 2017-03-01 NOTE — Progress Notes (Signed)
Adult Psychoeducational Group Note  Date:  03/01/2017 Time:  8:55 PM  Group Topic/Focus:  Wrap-Up Group:   The focus of this group is to help patients review their daily goal of treatment and discuss progress on daily workbooks.  Participation Level:  Active  Participation Quality:  Appropriate  Affect:  Appropriate  Cognitive:  Appropriate  Insight: Appropriate  Engagement in Group:  Engaged  Modes of Intervention:  Discussion  Additional Comments: Pt stated her goal for today was work on coping skills to help when she feels overwhelm. Pt stated she accomplished her goal and felt good about it. Pt rated her over all day a 10. Pt attended all groups held today.  Jenna Henry 03/01/2017, 8:55 PM

## 2017-03-01 NOTE — Progress Notes (Signed)
D: Pt denies SI/HI/AVH. Pt is pleasant and cooperative. Pt stated she was ready to go, pt has been appropriate on the unit this evening, pt has bright affect, pt had visitors and pt stated she was doing better with her coping skills.   A: Pt was offered support and encouragement. Pt was given scheduled medications. Pt was encourage to attend groups. Q 15 minute checks were done for safety.   R:Pt attends groups and interacts well with peers and staff. Pt is taking medication. Pt has no complaints.Pt receptive to treatment and safety maintained on unit.

## 2017-03-01 NOTE — Progress Notes (Signed)
D: Pt presents with a flat affect and depressed mood. Pt rates depression 3/10. Anxiety 4/10. Hopelessness 4/10. Pt denies SI/HI. Pt reports fair sleep at bedtime. Pt c/o ongoing nausea. Pt offered Zofran but refused. Pt stated that Zofran makes the nausea worse. Pt requested Gatorade for nausea. Gatorade given at pt request. Pt worried that she might be pregnant. Dr. Jama Flavors made aware of pt complaint in tx team. Pt encouraged to speak with MD about lab results. Pt stated that she feels that her mood is stabilized now. Pt reports decreased stress and irritability. A: Medications reviewed with pt. Medications administered as ordered per MD. Verbal support provided. Pt encouraged to attend groups. Interdisciplinary tx team completed with pt and CSW. 15 minute checks performed for safety. Pt signed 72 hour request for d/c form at 10:55 am. R: Pt compliant with tx.

## 2017-03-01 NOTE — Progress Notes (Signed)
[facility] MD Progress Note  03/01/2017 5:17 PM Jenna Henry  MRN:  [id number] Subjective:  Patient reports she is feeling "OK" at this time, and minimizes depression at present. Denies suicidal ideations . Denies medication side effects. Objective : I have discussed case with treatment team and have met with patient . Patient is a 21 year old female , who presented following overdosing on OTC ( Ibuprofen and Acetaminophen ) . She reports overdose was impulsive, unplanned. States she has been facing stressors associated with being in college and having  full time employment. At this time minimizes depression, states she is feeling better. On admission had reported visual hallucinations- she denies current hallucinations, and does not appear internally preoccupied. States " it was more like I would feel someone near me, not really seeing someone". Reports these experiences have resolved . As she improves she is focusing more on disposition planning and states she is hopeful to discharge soon.  Currently on Effexor XR, which she is tolerating well, states she has been on this medication in the past, without side effects and feels she responded well .  Principal Problem: MDD (major depressive disorder), recurrent, severe, with psychosis ([place]) Diagnosis:   Patient Active Problem List   Diagnosis Date Noted  . Severe major depression without psychotic features ([place]) [F32.2] 02/28/2017  . MDD (major depressive disorder), recurrent, severe, with psychosis ([place]) [F33.3] 02/28/2017  . Suicide attempt ([place]) [T14.91XA] 02/28/2017  . Anxiety state [F41.1] 02/28/2017  . Insomnia [G47.00] 02/28/2017  . Herpes [B00.9] 11/12/2016  . Irregular menstruation [N92.6] 07/06/2016   Total Time spent with patient: 20 minutes  Past Medical History:  Past Medical History:  Diagnosis Date  . Anxiety   . Asthma   . Depression   . Herpes   . Polycystic ovaries     Past Surgical History:  Procedure Laterality Date  .  CYSTECTOMY     Family History:  Family History  Problem Relation Age of Onset  . Hypertension Mother    Social History:  History  Alcohol Use No     History  Drug Use No    Social History   Social History  . Marital status: Single    Spouse name: N/A  . Number of children: N/A  . Years of education: N/A   Social History Main Topics  . Smoking status: Never Smoker  . Smokeless tobacco: Never Used  . Alcohol use No  . Drug use: No  . Sexual activity: Yes   Other Topics Concern  . None   Social History Narrative  . None   Additional Social History:    Pain Medications: overdosed on tylenol and ibuprofen Prescriptions: denies Over the Counter: denies History of alcohol / drug use?: No history of alcohol / drug abuse  Sleep: Good  Appetite:  Good  Current Medications: Current Facility-Administered Medications  Medication Dose Route Frequency Provider Last Rate Last Dose  . acetaminophen (TYLENOL) tablet 650 mg  650 mg Oral Q6H PRN Lindon Romp A, NP      . alum & mag hydroxide-simeth (MAALOX/MYLANTA) 200-200-20 MG/5ML suspension 30 mL  30 mL Oral Q4H PRN Lindon Romp A, NP      . hydrOXYzine (ATARAX/VISTARIL) tablet 25 mg  25 mg Oral TID PRN Rozetta Nunnery, NP   25 mg at 02/28/17 2223  . magnesium hydroxide (MILK OF MAGNESIA) suspension 30 mL  30 mL Oral Daily PRN Lindon Romp A, NP      . ondansetron (ZOFRAN-ODT) disintegrating  tablet 4 mg  4 mg Oral Q8H PRN Lindon Romp A, NP   4 mg at 02/28/17 0841  . traZODone (DESYREL) tablet 50 mg  50 mg Oral QHS PRN Lindon Romp A, NP   50 mg at 02/28/17 2223  . venlafaxine XR (EFFEXOR-XR) 24 hr capsule 37.5 mg  37.5 mg Oral Q breakfast Rankin, Shuvon B, NP   37.5 mg at 03/01/17 4270    Lab Results:  Results for orders placed or performed during the hospital encounter of 02/27/17 (from the past 48 hour(s))  Hemoglobin A1c     Status: None   Collection Time: 02/28/17  7:31 AM  Result Value Ref Range   Hgb A1c MFr Bld  5.1 4.8 - 5.6 %    Comment: (NOTE) Pre diabetes:          5.7%-6.4% Diabetes:              >6.4% Glycemic control for   <7.0% adults with diabetes    Mean Plasma Glucose 99.67 mg/dL    Comment: Performed at [facility], [address], [place], [place] [address]  TSH     Status: None   Collection Time: 02/28/17  7:31 AM  Result Value Ref Range   TSH 2.394 0.350 - 4.500 uIU/mL    Comment: Performed by a 3rd Generation assay with a functional sensitivity of <=0.01 uIU/mL. Performed at [facility], [place] [address] [place], [place] [address]   Lipid panel     Status: Abnormal   Collection Time: 03/01/17  6:31 AM  Result Value Ref Range   Cholesterol 147 0 - 200 mg/dL   Triglycerides 118 <150 mg/dL   HDL 35 (L) >40 mg/dL   Total CHOL/HDL Ratio 4.2 RATIO   VLDL 24 0 - 40 mg/dL   LDL Cholesterol 88 0 - 99 mg/dL    Comment:        Total Cholesterol/HDL:CHD Risk Coronary Heart Disease Risk Table                     Men   Women  1/2 Average Risk   3.4   3.3  Average Risk       5.0   4.4  2 X Average Risk   9.6   7.1  3 X Average Risk  23.4   11.0        Use the calculated Patient Ratio above and the CHD Risk Table to determine the patient's CHD Risk.        ATP III CLASSIFICATION (LDL):  <100     mg/dL   Optimal  100-129  mg/dL   Near or Above                    Optimal  130-159  mg/dL   Borderline  160-189  mg/dL   High  >190     mg/dL   Very High Performed at [facility] [address] [place], [place] [address]   Basic metabolic panel     Status: Abnormal   Collection Time: 03/01/17  6:31 AM  Result Value Ref Range   Sodium 139 135 - 145 mmol/L   Potassium 3.5 3.5 - 5.1 mmol/L   Chloride 102 101 - 111 mmol/L   CO2 29 22 - 32 mmol/L   Glucose, Bld 98 65 - 99 mg/dL   BUN 9 6 - 20 mg/dL   Creatinine, Ser 0.87 0.44 -  1.00 mg/dL   Calcium 10.7 (H) 8.9 - 10.3 mg/dL   GFR calc non Af Amer >60 >60 mL/min   GFR calc Af Amer >60 >60 mL/min     Comment: (NOTE) The eGFR has been calculated using the CKD EPI equation. This calculation has not been validated in all clinical situations. eGFR's persistently <60 mL/min signify possible Chronic Kidney Disease.    Anion gap 8 5 - 15    Comment: Performed at [facility], [place] [address] [place], [place] [address]    Blood Alcohol level:  Lab Results  Component Value Date   ETH <10 56/38/7564    Metabolic Disorder Labs: Lab Results  Component Value Date   HGBA1C 5.1 02/28/2017   MPG 99.67 02/28/2017   MPG 97 07/03/2016   Lab Results  Component Value Date   PROLACTIN 8.3 07/06/2016   PROLACTIN 8.5 07/03/2016   Lab Results  Component Value Date   CHOL 147 03/01/2017   TRIG 118 03/01/2017   HDL 35 (L) 03/01/2017   CHOLHDL 4.2 03/01/2017   VLDL 24 03/01/2017   LDLCALC 88 03/01/2017    Physical Findings: AIMS: Facial and Oral Movements Muscles of Facial Expression: None, normal Lips and Perioral Area: None, normal Jaw: None, normal Tongue: None, normal,Extremity Movements Upper (arms, wrists, hands, fingers): None, normal Lower (legs, knees, ankles, toes): None, normal, Trunk Movements Neck, shoulders, hips: None, normal, Overall Severity Severity of abnormal movements (highest score from questions above): None, normal Incapacitation due to abnormal movements: None, normal Patient's awareness of abnormal movements (rate only patient's report): No Awareness, Dental Status Current problems with teeth and/or dentures?: No Does patient usually wear dentures?: No  CIWA:  CIWA-Ar Total: 5 COWS:  COWS Total Score: 3  Musculoskeletal: Strength & Muscle Tone: within normal limits Gait & Station: normal Patient leans: N/A  Psychiatric Specialty Exam: Physical Exam  ROS denies chest pain, no shortness of breath, (+) nausea,  no vomiting   Blood pressure 113/67, pulse 87, temperature 98.7 F (37.1 C), temperature source Oral, resp. rate 18,  height 5' 5.75" (1.67 m), weight 86.2 kg (190 lb).Body mass index is 30.9 kg/m.  General Appearance: Well Groomed  Eye Contact:  Good  Speech:  Normal Rate  Volume:  Normal  Mood:  reports her mood is improved, feels better   Affect:  Appropriate and reactive   Thought Process:  Linear and Descriptions of Associations: Intact  Orientation:  Full (Time, Place, and Person)  Thought Content:  denies hallucinations, no delusions, not internally preoccupied   Suicidal Thoughts:  No denies suicidal or self injurious ideations, denies homicidal or violent ideations   Homicidal Thoughts:  No  Memory:  recent and remote grossly intact   Judgement:  Other:  fair- improving   Insight:  fair- improving   Psychomotor Activity:  Normal  Concentration:  Concentration: Good and Attention Span: Good  Recall:  Good  Fund of Knowledge:  Good  Language:  Good  Akathisia:  Negative  Handed:  Right  AIMS (if indicated):     Assets:  Desire for Improvement Resilience  ADL's:  Intact  Cognition:  WNL  Sleep:  Number of Hours: 6.25   Assessment - patient reports she is feeling better, and at this time denies any suicidal ideations. She denies hallucinations and does not appear internally preoccupied . As she improves she is presenting future oriented and hopeful to discharge soon. Denies medication side effects- is on Effexor XR , which she  has been on in the past with good response. Patient reports recent nausea, and had expressed concern she might be pregnant due to this symptom, but 9/29 HCG is negative   Treatment Plan Summary: Daily contact with patient to assess and evaluate symptoms and progress in treatment, Medication management, Plan inpatient treatment  and medications as below  Encourage group and milieu participation to work on coping skills and symptom reduction Treatment team working on disposition planning options Continue Trazodone 50 mgrs QHS PRN for insomnia Increase Effexor XR to 75  mgrds QDAY for depression, anxiety Continue Vistaril 25 mgrs Q 9 hours PRN for anxiety as needed Jenne Campus, MD 03/01/2017, 5:17 PM

## 2017-03-01 NOTE — Progress Notes (Deleted)
Error

## 2017-03-01 NOTE — Progress Notes (Signed)
Recreation Therapy Notes  Date: 03/01/17 Time: 0930 Location: [address] Dayroom  Group Topic: Stress Management  Goal Area(s) Addresses:  Patient will verbalize importance of using healthy stress management.  Patient will identify positive emotions associated with healthy stress management.   Behavioral Response: Engaged  Intervention: Stress Management  Activity :  LRT introduced the stress management technique of guided imagery.  LRT read a script that allowed patients to take a mental vacation to the beach.  Patients were to follow along as LRT read script to fully engage in the technique.  Education:  Stress Management, Discharge Planning.   Education Outcome: Acknowledges edcuation/In group clarification offered/Needs additional education  Clinical Observations/Feedback: Pt attended group.   Caroll Rancher, LRT/CTRS         Lillia Abed, Meredith Kilbride A 03/01/2017 1:31 PM

## 2017-03-01 NOTE — [facility] Group Notes (Signed)
[facility] LCSW Group Therapy Note  Date/Time:  Type of Therapy and Topic:  Group Therapy:  Overcoming Obstacles  Participation Level:  Active  Description of Group:    In this group patients will be encouraged to explore what they see as obstacles to their own wellness and recovery. They will be guided to discuss their thoughts, feelings, and behaviors related to these obstacles. The group will process together ways to cope with barriers, with attention given to specific choices patients can make. Each patient will be challenged to identify changes they are motivated to make in order to overcome their obstacles. This group will be process-oriented, with patients participating in exploration of their own experiences as well as giving and receiving support and challenge from other group members.  Therapeutic Goals: 1. Patient will identify personal and current obstacles as they relate to admission. 2. Patient will identify barriers that currently interfere with their wellness or overcoming obstacles.  3. Patient will identify feelings, thought process and behaviors related to these barriers. 4. Patient will identify two changes they are willing to make to overcome these obstacles:    Summary of Patient Progress Frustrated about being hospitalized, feels "stuck."  Feels like she is "not making progress."  Reports anxiety when "in my room at night and all I do is think."  Feels she would be better off at home in familiar surroundings.  Listens to music as coping strategy.     Therapeutic Modalities:   Cognitive Behavioral Therapy Solution Focused Therapy Motivational Interviewing Relapse Prevention Therapy  Santa Genera, LCSW Lead Clinical Social Worker Phone:  [phone]

## 2017-03-01 NOTE — Plan of Care (Signed)
Problem: Activity: Goal: Interest or engagement in activities will improve Outcome: Progressing Pt compliant with attending groups. ADL's completed.

## 2017-03-01 NOTE — Plan of Care (Signed)
Problem: Activity: Goal: Interest or engagement in leisure activities will improve Outcome: Progressing Pt seen with peers in the dayroom, pt had visitors, pt had bright affect this evening

## 2017-03-02 MED ORDER — VENLAFAXINE HCL ER 75 MG PO CP24: 75.0000 mg | ORAL_CAPSULE | Freq: Every day | ORAL | 0 refills | Status: DC

## 2017-03-02 MED ORDER — TRAZODONE HCL 50 MG PO TABS: 50.0000 mg | ORAL_TABLET | Freq: Every evening | ORAL | 0 refills | Status: DC | PRN

## 2017-03-02 MED ORDER — HYDROXYZINE HCL 25 MG PO TABS: 25.0000 mg | ORAL_TABLET | Freq: Three times a day (TID) | ORAL | 0 refills | Status: DC | PRN

## 2017-03-02 NOTE — Progress Notes (Signed)
Recreation Therapy Notes  Animal-Assisted Activity (AAA) Program Checklist/Progress Notes Patient Eligibility Criteria Checklist & Daily Group note for Rec TxIntervention  Date: 10.02.2018 Time: 11:20am Location: [address]   AAA/T Program Assumption of Risk Form signed by Patient/ or Parent Legal Guardian Yes  Patient is free of allergies or sever asthma Yes  Patient reports no fear of animals Yes  Patient reports no history of cruelty to animals Yes  Patient understands his/her participation is voluntary Yes  Behavioral Response: Did not attend.    Marykay Lex Jager Koska, LRT/CTRS         Guida Asman L 03/02/2017 2:26 PM

## 2017-03-02 NOTE — Progress Notes (Signed)
Patient ID: Jenna Henry, female   DOB: 1995/11/07, 21 y.o.   MRN: [id number]  Discharge Note- Belongings returned to patient at time of discharge. Patient denies any pain or discomfort. Discharge instructions and medications were reviewed with patient. Patient verbalized understanding of both medications and discharge instructions. Patient discharged to lobby where her ride was waiting. Patient appears in no apparent distress upon discharge. Q15 minute safety checks maintained until discharge.

## 2017-03-02 NOTE — [facility] Suicide Risk Assessment (Signed)
[facility] Discharge Suicide Risk Assessment   Principal Problem: MDD (major depressive disorder), recurrent, severe, with psychosis ([facility]) Discharge Diagnoses:  Patient Active Problem List   Diagnosis Date Noted  . Severe major depression without psychotic features ([facility]) [F32.2] 02/28/2017  . MDD (major depressive disorder), recurrent, severe, with psychosis ([facility]) [F33.3] 02/28/2017  . Suicide attempt ([facility]) [T14.91XA] 02/28/2017  . Anxiety state [F41.1] 02/28/2017  . Insomnia [G47.00] 02/28/2017  . Herpes [B00.9] 11/12/2016  . Irregular menstruation [N92.6] 07/06/2016    Total Time spent with patient: 30 minutes  Musculoskeletal: Strength & Muscle Tone: within normal limits Gait & Station: normal Patient leans: N/A  Psychiatric Specialty Exam: ROS denies headache, no chest pain, improved nausea, no vomiting, no diarrhea   Blood pressure 111/68, pulse 79, temperature 98.4 F (36.9 C), temperature source Oral, resp. rate 18, height 5' 5.75" (1.67 m), weight 86.2 kg (190 lb).Body mass index is 30.9 kg/m.  General Appearance: improved grooming   Eye Contact::  Good  Speech:  Normal Rate409  Volume:  Normal  Mood:  improving mood , states " I feel OK" , denies depression at this time   Affect:  appropriate, reactive   Thought Process:  Linear and Descriptions of Associations: Intact  Orientation:  Other:  fully alert and attentive   Thought Content:  denies hallucinations, no delusions, not internally preoccupied   Suicidal Thoughts:  No denies suicidal or self injurious ideations, denies any homicidal or violent ideations   Homicidal Thoughts:  No  Memory:  recent and remote grossly intact   Judgement:  Other:  improved   Insight:  improved   Psychomotor Activity:  Normal  Concentration:  Good  Recall:  Good  Fund of Knowledge:Good  Language: Good  Akathisia:  Negative  Handed:  Right  AIMS (if indicated):     Assets:  Communication Skills Desire for  Improvement Resilience Vocational/Educational  Sleep:  Number of Hours: 6.25  Cognition: WNL  ADL's:  Intact   Mental Status Per Nursing Assessment::   On Admission:  NA  Demographic Factors:  21 year old single female, lives with mother , no children, currently in college plus employed full time  Loss Factors: Inherent to time constraints and pressures of full academic load in college and full time employment   Historical Factors: History of depression, history of prior suicidal attempt and psychiatric  admission 2016.   Risk Reduction Factors:   Sense of responsibility to family, Employed, Living with another person, especially a relative, Positive social support and Positive coping skills or problem solving skills  Continued Clinical Symptoms:  At this time patient is alert,attentive, well related, pleasant, mood improved, denies feeling depressed at this time, affect appropriate, no thought disorder, no suicidal or self injurious ideations, no homicidal or violent ideations, no hallucinations, no delusions . Future oriented. Denies medication side effects. On Effexor XR, tolerating it well thus far, we reviewed side effects, including risk of withdrawal if stopped suddenly and potential risk of increased suicidal ideations early in treatment with antidepressants in young adults  Behavior on unit in good control.   Cognitive Features That Contribute To Risk:  No gross cognitive deficits noted upon discharge. Is alert , attentive, and oriented x 3   Suicide Risk:  Mild:  Suicidal ideation of limited frequency, intensity, duration, and specificity.  There are no identifiable plans, no associated intent, mild dysphoria and related symptoms, good self-control (both objective and subjective assessment), few other risk factors, and identifiable protective factors, including  available and accessible social support.  Follow-up Information    [facility] Follow up on 03/05/2017.   Specialty:   Behavioral Health Why:  Hospital discharge follow up appointment on 10/5 at 8:15 AM.  Please call to cancel/reschedule if needed.  Contact information: [address] [place] [address] [phone]           Plan Of Care/Follow-up recommendations:  Activity:  as tolerated  Diet:  Regular Tests:  NA Other:  See below   Patient expresses readiness for discharge and is leaving unit in good spirits Plans to return home Plans to follow up as above   Craige Cotta, MD 03/02/2017, 9:38 AM

## 2017-03-02 NOTE — Discharge Summary (Signed)
Physician Discharge Summary Note  Patient:  Jenna Henry is an 21 y.o., female MRN:  [id number] DOB:  Mar 17, 1996 Patient phone:  [phone] (home)  Patient address:   [address] [place] [address],  Total Time spent with patient: 30 minutes  Date of Admission:  02/27/2017 Date of Discharge: 03/02/2017  Reason for Admission: Per HPI- Chart reviewed and face to face evaluation on 02/28/17. On evaluation:  [name] reports that she was feeling overwhelmed with working full time and going to school.  States that she was having a worsening in depression and not feeling herself and took an overdose of Ibuprofen and Tylenol.  Patient states prior history of suicide attempt in 2016 via overdose and was hospitalized [facility]).  Denies history of self injurious behavior.  At current time patient rates depression 6/10, anxiety 5/10 and continues to have visual hallucinations seeing people in black/white and paranoia that someone is following her.  She denies verbal hallucinations.  Patient also denies homicidal ideation.Patient is able to contract for safety on the unit.    Principal Problem: MDD (major depressive disorder), recurrent, severe, with psychosis [facility]) Discharge Diagnoses: Patient Active Problem List   Diagnosis Date Noted  . Severe major depression without psychotic features (HCC) [F32.2] 02/28/2017  . MDD (major depressive disorder), recurrent, severe, with psychosis (HCC) [F33.3] 02/28/2017  . Suicide attempt (HCC) [T14.91XA] 02/28/2017  . Anxiety state [F41.1] 02/28/2017  . Insomnia [G47.00] 02/28/2017  . Herpes [B00.9] 11/12/2016  . Irregular menstruation [N92.6] 07/06/2016    Past Psychiatric History:   Past Medical History:  Past Medical History:  Diagnosis Date  . Anxiety   . Asthma   . Depression   . Herpes   . Polycystic ovaries     Past Surgical History:  Procedure Laterality Date  . CYSTECTOMY     Family History:  Family History  Problem  Relation Age of Onset  . Hypertension Mother    Family Psychiatric  History:  Social History:  History  Alcohol Use No     History  Drug Use No    Social History   Social History  . Marital status: Single    Spouse name: N/A  . Number of children: N/A  . Years of education: N/A   Social History Main Topics  . Smoking status: Never Smoker  . Smokeless tobacco: Never Used  . Alcohol use No  . Drug use: No  . Sexual activity: Yes   Other Topics Concern  . None   Social History Narrative  . None    Hospital Course:  Emilene Roma was admitted for MDD (major depressive disorder), recurrent, severe, with psychosis (HCC) and crisis management.  Pt was treated discharged with the medications listed below under Medication List.  Medical problems were identified and treated as needed.  Home medications were restarted as appropriate.  Improvement was monitored by observation and [name] 's daily report of symptom reduction.  Emotional and mental status was monitored by daily self-inventory reports completed by [name] and clinical staff.         Tynslee Bowlds was evaluated by the treatment team for stability and plans for continued recovery upon discharge. [name] 's motivation was an integral factor for scheduling further treatment. Employment, transportation, bed availability, health status, family support, and any pending legal issues were also considered during hospital stay. Pt was offered further treatment options upon discharge including but not limited to Residential, Intensive Outpatient, and Outpatient treatment.  Blanchie Dessert  [name] will follow up with the services as listed below under Follow Up Information.     Upon completion of this admission the patient was both mentally and medically stable for discharge denying suicidal/homicidal ideation, auditory/visual/tactile hallucinations, delusional thoughts and paranoia.    [name] responded well to treatment with   Trazodone  Effexor 75 mg without adverse effects. Pt demonstrated improvement without reported or observed adverse effects to the point of stability appropriate for outpatient management. Pertinent labs include: Lipid panel and CMP for which outpatient follow-up is necessary for lab recheck as mentioned below. Reviewed CBC, CMP, BAL, and UDS; all unremarkable aside from noted exceptions.   Physical Findings: AIMS: Facial and Oral Movements Muscles of Facial Expression: None, normal Lips and Perioral Area: None, normal Jaw: None, normal Tongue: None, normal,Extremity Movements Upper (arms, wrists, hands, fingers): None, normal Lower (legs, knees, ankles, toes): None, normal, Trunk Movements Neck, shoulders, hips: None, normal, Overall Severity Severity of abnormal movements (highest score from questions above): None, normal Incapacitation due to abnormal movements: None, normal Patient's awareness of abnormal movements (rate only patient's report): No Awareness, Dental Status Current problems with teeth and/or dentures?: No Does patient usually wear dentures?: No  CIWA:  CIWA-Ar Total: 5 COWS:  COWS Total Score: 3  Musculoskeletal: Strength & Muscle Tone: within normal limits Gait & Station: normal Patient leans: N/A  Psychiatric Specialty Exam: See SRA by MD Physical Exam  Nursing note and vitals reviewed. Constitutional: She is oriented to person, place, and time. She appears well-developed.  Cardiovascular: Normal rate.   Neurological: She is oriented to person, place, and time.  Skin: Skin is warm.    Review of Systems  Psychiatric/Behavioral: Negative for depression (stable). The patient is not nervous/anxious (stable).     Blood pressure 111/68, pulse 79, temperature 98.4 F (36.9 C), temperature source Oral, resp. rate 18, height 5' 5.75" (1.67 m), weight 86.2 kg (190 lb).Body mass index is 30.9 kg/m.   Have you used any form of tobacco in the last 30 days?  (Cigarettes, Smokeless Tobacco, Cigars, and/or Pipes): No  Has this patient used any form of tobacco in the last 30 days? (Cigarettes, Smokeless Tobacco, Cigars, and/or Pipes)  Yes, A prescription for an FDA-approved tobacco cessation medication was offered at discharge and the patient refused  Blood Alcohol level:  Lab Results  Component Value Date   ETH <10 02/27/2017    Metabolic Disorder Labs:  Lab Results  Component Value Date   HGBA1C 5.1 02/28/2017   MPG 99.67 02/28/2017   MPG 97 07/03/2016   Lab Results  Component Value Date   PROLACTIN 8.3 07/06/2016   PROLACTIN 8.5 07/03/2016   Lab Results  Component Value Date   CHOL 147 03/01/2017   TRIG 118 03/01/2017   HDL 35 (L) 03/01/2017   CHOLHDL 4.2 03/01/2017   VLDL 24 03/01/2017   LDLCALC 88 03/01/2017    See Psychiatric Specialty Exam and Suicide Risk Assessment completed by Attending Physician prior to discharge.  Discharge destination:  Home  Is patient on multiple antipsychotic therapies at discharge:  No   Has Patient had three or more failed trials of antipsychotic monotherapy by history:  No  Recommended Plan for Multiple Antipsychotic Therapies: NA  Discharge Instructions    Diet - low sodium heart healthy    Complete by:  As directed    Discharge instructions    Complete by:  As directed    Take all medications as prescribed. Keep all follow-up appointments  as scheduled.  Do not consume alcohol or use illegal drugs while on prescription medications. Report any adverse effects from your medications to your primary care provider promptly.  In the event of recurrent symptoms or worsening symptoms, call 911, a crisis hotline, or go to the nearest emergency department for evaluation.   Increase activity slowly    Complete by:  As directed      Allergies as of 03/02/2017      Reactions   Ortho Tri-cyclen [norgestimate-eth Estradiol] Hives, Itching, Rash, Other (See Comments)   "Burning all over"       Medication List    STOP taking these medications   ibuprofen 600 MG tablet Commonly known as:  ADVIL,MOTRIN   levonorgestrel-ethinyl estradiol 0.1-20 MG-MCG tablet Commonly known as:  AVIANE,ALESSE,LESSINA   meclizine 32 MG tablet Commonly known as:  ANTIVERT   metroNIDAZOLE 500 MG tablet Commonly known as:  FLAGYL   ondansetron 8 MG disintegrating tablet Commonly known as:  ZOFRAN ODT   Prenatal Vitamins 0.8 MG tablet   valACYclovir 500 MG tablet Commonly known as:  VALTREX     TAKE these medications     Indication  hydrOXYzine 25 MG tablet Commonly known as:  ATARAX/VISTARIL Take 1 tablet (25 mg total) by mouth 3 (three) times daily as needed for anxiety.  Indication:  Feeling Anxious   PROAIR HFA 108 (90 Base) MCG/ACT inhaler Generic drug:  albuterol Inhale 2 puffs into the lungs every 6 (six) hours as needed for wheezing or shortness of breath.  Indication:  Asthma   traZODone 50 MG tablet Commonly known as:  DESYREL Take 1 tablet (50 mg total) by mouth at bedtime as needed for sleep.  Indication:  Trouble Sleeping   venlafaxine XR 75 MG 24 hr capsule Commonly known as:  EFFEXOR-XR Take 1 capsule (75 mg total) by mouth daily with breakfast.  Indication:  Generalized Anxiety Disorder, Major Depressive Disorder      Follow-up Information    [facility] Follow up on 03/05/2017.   Specialty:  Behavioral Health Why:  Hospital discharge follow up appointment on 10/5 at 8:15 AM.  Please call to cancel/reschedule if needed.  [address] [place] [address] [phone]           Follow-up recommendations:  Activity:  as tolerated  Diet:  heart healthy Other:  [facility]  Comments:  Take all medications as prescribed. Keep all follow-up appointments as scheduled.  Do not consume alcohol or use illegal drugs while on prescription medications. Report any adverse effects from your medications to your primary care provider promptly.  In  the event of recurrent symptoms or worsening symptoms, call 911, a crisis hotline, or go to the nearest emergency department for evaluation.   Signed: Oneta Rack, NP 03/02/2017, 9:04 AM   Patient seen, Suicide Assessment Completed.  Disposition Plan Reviewed

## 2017-03-02 NOTE — Progress Notes (Signed)
Patient ID: Jenna Henry, female   DOB: 07-08-1995, 21 y.o.   MRN: [id number]  DAR: Pt. Denies SI/HI and A/V Hallucinations. She reports that she slept well last night, her appetite is good, energy level is normal, and her concentration is good. She is initially flat in affect but brightens during interaction. She rates her depression level 3/10, hopelessness level 2/10, and anxiety level 3/10. She does not report any pain or discomfort at this time. Support and encouragement provided to the patient. Scheduled medication administered to patient per physician's orders. Patient is minimal with writer this morning but is cooperative. She reports that she is looking forward to discharging soon. Q15 minute checks are maintained for safety.

## 2017-03-02 NOTE — Progress Notes (Signed)
  [facility] Adult Case Management Discharge Plan :  Will you be returning to the same living situation after discharge:  Yes,  Pt returning home At discharge, do you have transportation home?: Yes,  Pt mother to pick up Do you have the ability to pay for your medications: Yes,  Pt provided with samples and prescriptions  Release of information consent forms completed and in the chart;  Patient's signature needed at discharge.  Patient to Follow up at: Follow-up Information    [facility] Follow up on 03/05/2017.   Specialty:  Behavioral Health Why:  Hospital discharge follow up appointment on 10/5 at 8:15 AM.  Please call to cancel/reschedule if needed.  Contact information: [address] [place] [address] [phone]           Next level of care provider has access to [facility] Link:no  Safety Planning and Suicide Prevention discussed: Yes,  with mother; see SPE note  Have you used any form of tobacco in the last 30 days? (Cigarettes, Smokeless Tobacco, Cigars, and/or Pipes): No  Has patient been referred to the Quitline?: N/A patient is not a smoker  Patient has been referred for addiction treatment: Yes  Verdene Lennert, LCSW 03/02/2017, 9:32 AM

## 2017-03-09 ENCOUNTER — Ambulatory Visit (HOSPITAL_COMMUNITY)
Admission: EM | Admit: 2017-03-09 | Discharge: 2017-03-09 | Disposition: A | Discharge status: Home / Self Care | Payer: Medicaid Other | Attending: Family Medicine | Admitting: Family Medicine

## 2017-03-09 ENCOUNTER — Encounter (HOSPITAL_COMMUNITY): Payer: Self-pay | Admitting: Emergency Medicine

## 2017-03-09 DIAGNOSIS — R109 Unspecified abdominal pain: Secondary | ICD-10-CM

## 2017-03-09 DIAGNOSIS — G8929 Other chronic pain: Secondary | ICD-10-CM

## 2017-03-09 DIAGNOSIS — R110 Nausea: Secondary | ICD-10-CM

## 2017-03-09 MED ORDER — DICYCLOMINE HCL 10 MG PO CAPS: 10.0000 mg | ORAL_CAPSULE | Freq: Three times a day (TID) | ORAL | 0 refills | Status: DC

## 2017-03-09 MED ORDER — PROMETHAZINE HCL 6.25 MG/5ML PO SYRP: 6.2500 mg | ORAL_SOLUTION | Freq: Four times a day (QID) | ORAL | 0 refills | Status: DC | PRN

## 2017-03-09 MED FILL — VENLAFAXINE HCL ER 75 MG CA: 75 | 30 days supply | Qty: 30 | Fill #0

## 2017-03-09 NOTE — ED Triage Notes (Signed)
Pt here for intermittent abd pain onset 6 months +++   Has been seen at [facility] for similar sx in the past  Sx also include anorexia, n/v/  Denies fevers, diarrhea, urinary sx  A&O x4... NAD... Ambulatory

## 2017-03-09 NOTE — ED Notes (Signed)
UA sample collected  

## 2017-03-09 NOTE — ED Provider Notes (Signed)
[facility]    CSN: [id number] Arrival date & time: 03/09/17  1522     History   Chief Complaint Chief Complaint  Patient presents with  . Abdominal Pain    HPI Jenna Henry is a 21 y.o. female.   HPI  She is here with her mother. She presents with a several month history of intermittent abdominal pain. Normally affects her lower quadrants. She describes as a crampy and aching pain. It does not radiate. Her bowel movements are unchanged. She has some nausea without vomiting. There are no fevers, unexplained weight loss, bleeding, or nighttime awakenings. She has had an extensive workup through her PCP which has been unfruitful. No significant change in her pain.   Past Medical History:  Diagnosis Date  . Anxiety   . Asthma   . Depression   . Herpes   . Polycystic ovaries     Patient Active Problem List   Diagnosis Date Noted  . Severe major depression without psychotic features (HCC) 02/28/2017  . 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 Suicide attempt (HCC) 02/28/2017  . Anxiety state 02/28/2017  . Insomnia 02/28/2017  . Herpes 11/12/2016  . Irregular menstruation 07/06/2016    Past Surgical History:  Procedure Laterality Date  . CYSTECTOMY       Home Medications    Prior to Admission medications   Medication Sig Start Date End Date Taking? Authorizing Provider  Prenatal Vit-Fe Fumarate-FA (MULTIVITAMIN-PRENATAL) 27-0.8 MG TABS tablet Take 1 tablet by mouth daily at 12 noon.   Yes [provider]  venlafaxine XR (EFFEXOR-XR) 75 MG 24 hr capsule Take 1 capsule (75 mg total) by mouth daily with breakfast. 03/03/17  Yes Oneta Rack, NP  albuterol (PROAIR HFA) 108 (90 Base) MCG/ACT inhaler Inhale 2 puffs into the lungs every 6 (six) hours as needed for wheezing or shortness of breath.    [provider]  dicyclomine (BENTYL) 10 MG capsule Take 1 capsule (10 mg total) by mouth 4 (four)  times daily -  before meals and at bedtime. 03/09/17   Sharlene Dory, DO  hydrOXYzine (ATARAX/VISTARIL) 25 MG tablet Take 1 tablet (25 mg total) by mouth 3 (three) times daily as needed for anxiety. 03/02/17   Oneta Rack, NP  promethazine (PHENERGAN) 6.25 MG/5ML syrup Take 5 mLs (6.25 mg total) by mouth every 6 (six) hours as needed for nausea or vomiting. 03/09/17   Sharlene Dory, DO  traZODone (DESYREL) 50 MG tablet Take 1 tablet (50 mg total) by mouth at bedtime as needed for sleep. 03/02/17   Oneta Rack, NP    Family History Family History  Problem Relation Age of Onset  . Hypertension Mother     Social History Social History  Substance Use Topics  . Smoking status: Never Smoker  . Smokeless tobacco: Never Used  . Alcohol use No     Allergies   Ortho tri-cyclen [norgestimate-eth estradiol]   Review of Systems Review of Systems  Constitutional: Negative for fever.  Gastrointestinal: Positive for abdominal pain.     Physical Exam Triage Vital Signs ED Triage Vitals  Enc Vitals Group     BP 03/09/17 1542 102/70     Pulse Rate 03/09/17 1542 61     Resp 03/09/17 1542 16     Temp 03/09/17 1542 99.1 F (37.3 C)     Temp Source 03/09/17 1542 Oral     SpO2 03/09/17 1542 98 %  Weight 03/09/17 1543 190 lb (86.2 kg)     Height 03/09/17 1543  (1.651 m)     Pain Score 03/09/17 1547 9   Updated Vital Signs BP 102/70 (BP Location: Right Arm)   Pulse 61   Temp 99.1 F (37.3 C) (Oral)   Resp 16   Ht  (1.651 m)   Wt 190 lb (86.2 kg)   SpO2 98%   BMI 31.62 kg/m   Physical Exam  Constitutional: She appears well-developed and well-nourished. No distress.  HENT:  Head: Normocephalic and atraumatic.  Mouth/Throat: Oropharynx is clear and moist.  Eyes: Pupils are equal, round, and reactive to light. EOM are normal.  Neck: Normal range of motion.  Cardiovascular: Normal rate and regular rhythm.   No murmur heard. Pulmonary/Chest:  Effort normal and breath sounds normal. No respiratory distress.  Abdominal:  Bowel sounds present, soft, mild tenderness to palpation in lower quadrants bilaterally, no distention, no guarding, no masses or organomegaly; negative Carnett's Murphy's, McBurney's, Rovsing's  Skin: Skin is warm. She is not diaphoretic.  Psychiatric: She has a normal mood and affect. Judgment normal.     UC Treatments / Results  Procedures Procedures none  Initial Impression / Assessment and Plan / UC Course  I have reviewed the triage vital signs and the nursing notes.  Pertinent labs & imaging results that were available during my care of the patient were reviewed by me and considered in my medical decision making (see chart for details).     Patient presents with continuing chronic abdominal pain. There are no red flags on exam or in her history. She really probably needs specialty evaluation given the extensive workup performed by her primary care team. I would like her to follow-up with her primary team and discuss referral to another provider who does accept her insurance. In the meantime, I will call in Bentyl and Phenergan for symptom relief. I also discussed looking at a low FODMAP diet. She understands that she needs to follow up with PCP for more definitive management, likely specialty referral at this point. She had her mother voiced understanding and agreement to the plan.  Final Clinical Impressions(s) / UC Diagnoses   Final diagnoses:  Chronic abdominal pain    New Prescriptions Discharge Medication List as of 03/09/2017  4:15 PM    START taking these medications   Details  dicyclomine (BENTYL) 10 MG capsule Take 1 capsule (10 mg total) by mouth 4 (four) times daily -  before meals and at bedtime., Starting Tue 03/09/2017, Normal    promethazine (PHENERGAN) 6.25 MG/5ML syrup Take 5 mLs (6.25 mg total) by mouth every 6 (six) hours as needed for nausea or vomiting., Starting Tue 03/09/2017,  Normal         Controlled Substance Prescriptions [place] Controlled Substance Registry consulted? Not Applicable   Sharlene Dory, [place] 03/09/17 1623

## 2017-03-09 NOTE — Discharge Instructions (Signed)
Call your PCP regarding your abdominal pain.  Today's visit is more of symptomatic management, not getting to the underlying cause. You may need to see a specialist.  Consider trying the low FODMAP diet.

## 2017-04-16 ENCOUNTER — Other Ambulatory Visit: Payer: Self-pay | Admitting: Family Medicine

## 2017-04-16 DIAGNOSIS — Z760 Encounter for issue of repeat prescription: Secondary | ICD-10-CM

## 2017-04-16 MED ORDER — ALBUTEROL SULFATE HFA 108 (90 BASE) MCG/ACT IN AERS: 2.0000 | INHALATION_SPRAY | Freq: Four times a day (QID) | RESPIRATORY_TRACT | 3 refills | Status: DC | PRN

## 2017-04-16 MED FILL — !VENTOLIN HFA INHALER: 108 (90 BAS | 25 days supply | Qty: 18 | Fill #0

## 2017-04-26 MED FILL — PROMETHAZINE 6.25 MG/5 ML S: 6.25 | 6 days supply | Qty: 118 | Fill #0

## 2017-04-26 MED FILL — DICYCLOMINE 10 MG CAPSULE: 10 | 15 days supply | Qty: 60 | Fill #0

## 2017-04-28 ENCOUNTER — Other Ambulatory Visit: Payer: Self-pay

## 2017-04-28 ENCOUNTER — Emergency Department (HOSPITAL_COMMUNITY)
Admission: EM | Admit: 2017-04-28 | Discharge: 2017-04-28 | Disposition: A | Discharge status: Home / Self Care | Payer: Self-pay | Attending: Emergency Medicine | Admitting: Emergency Medicine

## 2017-04-28 ENCOUNTER — Emergency Department (HOSPITAL_COMMUNITY): Payer: Self-pay

## 2017-04-28 ENCOUNTER — Encounter (HOSPITAL_COMMUNITY): Payer: Self-pay | Admitting: Radiology

## 2017-04-28 DIAGNOSIS — Z79899 Other long term (current) drug therapy: Secondary | ICD-10-CM | POA: Insufficient documentation

## 2017-04-28 DIAGNOSIS — S0990XA Unspecified injury of head, initial encounter: Secondary | ICD-10-CM | POA: Insufficient documentation

## 2017-04-28 DIAGNOSIS — Y929 Unspecified place or not applicable: Secondary | ICD-10-CM | POA: Insufficient documentation

## 2017-04-28 DIAGNOSIS — Y939 Activity, unspecified: Secondary | ICD-10-CM | POA: Insufficient documentation

## 2017-04-28 DIAGNOSIS — J45909 Unspecified asthma, uncomplicated: Secondary | ICD-10-CM | POA: Insufficient documentation

## 2017-04-28 DIAGNOSIS — Y999 Unspecified external cause status: Secondary | ICD-10-CM | POA: Insufficient documentation

## 2017-04-28 DIAGNOSIS — T148XXA Other injury of unspecified body region, initial encounter: Secondary | ICD-10-CM

## 2017-04-28 MED ORDER — KETOROLAC TROMETHAMINE 60 MG/2ML IM SOLN: 60.0000 mg | Freq: Once | INTRAMUSCULAR | Status: DC
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
## 2017-04-28 NOTE — ED Notes (Signed)
Patient given discharge instructions and verbalized understanding.  Patient stable to discharge at this time.  Patient is alert and oriented to baseline.  No distressed noted at this time.  All belongings taken with the patient at discharge.   

## 2017-04-28 NOTE — ED Notes (Signed)
Patient transported to CT 

## 2017-04-28 NOTE — Discharge Instructions (Signed)
You can take Tylenol or Ibuprofen as directed for pain. You can alternate Tylenol and Ibuprofen every 4 hours. If you take Tylenol at 1pm, then you can take Ibuprofen at 5pm. Then you can take Tylenol again at 9pm.   You can apply ice to the affected area for pain relief.  Follow-up with your primary care doctor in the next 24-48 hours for further evaluation.  Return to the ED for any Worsening headache, vision changes, difficulty walking, numbness/weakness of her arms or legs, vomiting, difficulty swallowing, difficulty breathing or any other worsening or concerning symptoms.
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
## 2017-05-14 ENCOUNTER — Encounter: Payer: Self-pay | Admitting: Family Medicine

## 2017-05-14 ENCOUNTER — Ambulatory Visit: Payer: Self-pay | Attending: Family Medicine | Admitting: Family Medicine

## 2017-05-14 VITALS — BP 110/73 | HR 66 | Temp 98.4°F | Resp 18 | Ht 65.0 in | Wt 193.2 lb

## 2017-05-14 DIAGNOSIS — F332 Major depressive disorder, recurrent severe without psychotic features: Secondary | ICD-10-CM | POA: Insufficient documentation

## 2017-05-14 DIAGNOSIS — R6889 Other general symptoms and signs: Secondary | ICD-10-CM

## 2017-05-14 DIAGNOSIS — R51 Headache: Secondary | ICD-10-CM | POA: Insufficient documentation

## 2017-05-14 DIAGNOSIS — R41840 Attention and concentration deficit: Secondary | ICD-10-CM | POA: Insufficient documentation

## 2017-05-14 DIAGNOSIS — Z79899 Other long term (current) drug therapy: Secondary | ICD-10-CM | POA: Insufficient documentation

## 2017-05-14 DIAGNOSIS — Z87828 Personal history of other (healed) physical injury and trauma: Secondary | ICD-10-CM

## 2017-05-14 DIAGNOSIS — S0990XD Unspecified injury of head, subsequent encounter: Secondary | ICD-10-CM | POA: Insufficient documentation

## 2017-05-14 MED ORDER — IBUPROFEN 600 MG PO TABS: 600.0000 mg | ORAL_TABLET | Freq: Three times a day (TID) | ORAL | 0 refills | Status: DC | PRN

## 2017-05-14 MED ORDER — VENLAFAXINE HCL ER 75 MG PO CP24: 75.0000 mg | ORAL_CAPSULE | Freq: Every day | ORAL | 1 refills | Status: DC
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
## 2017-05-14 NOTE — Patient Instructions (Signed)
Head Injury, Adult There are many types of head injuries. They can be as minor as a bump. Some head injuries can be worse. Worse injuries include:  A strong hit to the head that hurts the brain (concussion).  A bruise of the brain (contusion). This means there is bleeding in the brain that can cause swelling.  A cracked skull (skull fracture).  Bleeding in the brain that gathers, gets thick (makes a clot), and forms a bump (hematoma).  Most problems from a head injury come in the first 24 hours. However, you may still have side effects up to 7-10 days after your injury. It is important to watch your condition for any changes. Follow these instructions at home: Activity  Rest as much as possible.  Avoid activities that are hard or tiring.  Make sure you get enough sleep.  Limit activities that need a lot of thought or attention, such as: ? Watching TV. ? Playing memory games and puzzles. ? Job-related work or homework. ? Working on the computer, social media, and texting.  Avoid activities that could cause another head injury until your doctor says it is okay. This includes playing sports.  Ask your doctor when it is safe for you to go back to your normal activities, such as work or school. Ask your doctor for a step-by-step plan for slowly going back to your normal activities.  Ask your doctor when you can drive, ride a bicycle, or use heavy machinery. Never do these activities if you are dizzy.  Lifestyle  Do not drink alcohol until your doctor says it is okay.  Avoid drug use.  If it is harder than usual to remember things, write them down.  If you are easily distracted, try to do one thing at a time.  Talk with family members or close friends when making important decisions.  Tell your friends, family, a trusted coworker, and work manager about your injury, symptoms, and limits (restrictions). Have them watch for any problems that are new or getting worse.  General  instructions  Take over-the-counter and prescription medicines only as told by your doctor.  Have someone stay with you for 24 hours after your head injury. This person should watch you for any changes in your symptoms and be ready to get help.  Keep all follow-up visits as told by your doctor. This is important.  Prevention  Work on your balance and strength. This can help you avoid falls.  Wear a seatbelt when you are in a moving vehicle.  Wear a helmet when: ? Riding a bicycle. ? Skiing. ? Doing any other sport or activity that has a risk of injury.  Drink alcohol only in moderation.  Make your home safer by: ? Getting rid of clutter from the floors and stairs, like things that can make you trip. ? Using grab bars in bathrooms and handrails by stairs. ? Placing non-slip mats on floors and in bathtubs. ? Putting more light in dim areas. Get help right away if:  You have: ? A very bad (severe) headache that is not helped by medicine. ? Trouble walking or weakness in your arms and legs. ? Clear or bloody fluid coming from your nose or ears. ? Changes in your seeing (vision). ? Jerky movements that you cannot control (seizure).  You throw up (vomit).  Your symptoms get worse.  You lose balance.  Your speech is slurred.  You pass out.  You are sleepier and have trouble staying awake.    The black centers of your eyes (pupils) change in size. These symptoms may be an emergency. Do not wait to see if the symptoms will go away. Get medical help right away. Call your local emergency services (911 in the [place] Do not drive yourself to the hospital. This information is not intended to replace advice given to you by your health care provider. Make sure you discuss any questions you have with your health care provider. Document Released: 04/30/2008 Document Revised: 12/12/2015 Document Reviewed: 11/26/2015 Elsevier Interactive Patient Education  2017 [employer]  

## 2017-05-14 NOTE — Progress Notes (Signed)
Patient is here for hospital f/up   Patient has front head pain that comes & goes

## 2017-05-28 ENCOUNTER — Emergency Department (HOSPITAL_COMMUNITY): Payer: Self-pay

## 2017-05-28 ENCOUNTER — Ambulatory Visit: Payer: Self-pay | Attending: Family Medicine

## 2017-05-28 ENCOUNTER — Emergency Department (HOSPITAL_COMMUNITY)
Admission: EM | Admit: 2017-05-28 | Discharge: 2017-05-28 | Disposition: A | Discharge status: Home / Self Care | Payer: Self-pay | Attending: Emergency Medicine | Admitting: Emergency Medicine

## 2017-05-28 ENCOUNTER — Encounter (HOSPITAL_COMMUNITY): Payer: Self-pay | Admitting: Emergency Medicine

## 2017-05-28 ENCOUNTER — Other Ambulatory Visit: Payer: Self-pay

## 2017-05-28 DIAGNOSIS — G479 Sleep disorder, unspecified: Secondary | ICD-10-CM | POA: Insufficient documentation

## 2017-05-28 DIAGNOSIS — Z79899 Other long term (current) drug therapy: Secondary | ICD-10-CM | POA: Insufficient documentation

## 2017-05-28 DIAGNOSIS — H538 Other visual disturbances: Secondary | ICD-10-CM | POA: Insufficient documentation

## 2017-05-28 DIAGNOSIS — F0781 Postconcussional syndrome: Secondary | ICD-10-CM | POA: Insufficient documentation

## 2017-05-28 DIAGNOSIS — R51 Headache: Secondary | ICD-10-CM | POA: Insufficient documentation

## 2017-05-28 DIAGNOSIS — R220 Localized swelling, mass and lump, head: Secondary | ICD-10-CM | POA: Insufficient documentation

## 2017-05-28 DIAGNOSIS — R110 Nausea: Secondary | ICD-10-CM | POA: Insufficient documentation

## 2017-05-28 DIAGNOSIS — J45909 Unspecified asthma, uncomplicated: Secondary | ICD-10-CM | POA: Insufficient documentation

## 2017-05-28 LAB — CBG MONITORING, ED: Glucose-Capillary: 103 mg/dL — ABNORMAL HIGH (ref 65–99)

## 2017-05-28 MED ORDER — BUTALBITAL-APAP-CAFFEINE 50-325-40 MG PO TABS: 1.0000 | ORAL_TABLET | Freq: Four times a day (QID) | ORAL | 0 refills | Status: DC | PRN

## 2017-05-28 MED ORDER — FLUTICASONE PROPIONATE 50 MCG/ACT NA SUSP: 1.0000 | Freq: Every day | NASAL | 2 refills | Status: DC

## 2017-05-28 MED ORDER — METOCLOPRAMIDE HCL 5 MG/ML IJ SOLN
10.0000 mg | Freq: Once | INTRAMUSCULAR | Status: AC
Start: 2017-05-28 — End: 2017-05-28
  Administered 2017-05-28: 10 mg via INTRAMUSCULAR
  Filled 2017-05-28: qty 2

## 2017-05-28 MED ORDER — DIPHENHYDRAMINE HCL 25 MG PO CAPS
25.0000 mg | ORAL_CAPSULE | Freq: Once | ORAL | Status: AC
  Administered 2017-05-28: 25 mg via ORAL
  Filled 2017-05-28: qty 1

## 2017-05-28 MED ORDER — KETOROLAC TROMETHAMINE 60 MG/2ML IM SOLN
60.0000 mg | Freq: Once | INTRAMUSCULAR | Status: AC
  Administered 2017-05-28: 60 mg via INTRAMUSCULAR
  Filled 2017-05-28: qty 2

## 2017-05-28 NOTE — ED Notes (Signed)
Pt. To CT via CT via stretcher.
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
## 2017-05-28 NOTE — Discharge Instructions (Signed)
Take flonase for sinus disease. Take Fioricet for severe head pain only. Follow up with your doctor. Return if worsening.

## 2017-06-05 ENCOUNTER — Encounter (HOSPITAL_COMMUNITY): Payer: Self-pay | Admitting: Emergency Medicine

## 2017-06-05 ENCOUNTER — Ambulatory Visit (HOSPITAL_COMMUNITY)
Admission: EM | Admit: 2017-06-05 | Discharge: 2017-06-05 | Disposition: A | Discharge status: Home / Self Care | Payer: Self-pay | Attending: Family Medicine | Admitting: Family Medicine

## 2017-06-05 ENCOUNTER — Other Ambulatory Visit: Payer: Self-pay

## 2017-06-05 DIAGNOSIS — H00014 Hordeolum externum left upper eyelid: Secondary | ICD-10-CM

## 2017-06-05 MED ORDER — TOBRAMYCIN 0.3 % OP SOLN: 1.0000 [drp] | Freq: Four times a day (QID) | OPHTHALMIC | 0 refills | Status: DC

## 2017-06-05 NOTE — Discharge Instructions (Signed)
Return if you are not seeing improvement within the next 48-72 hours.

## 2017-06-05 NOTE — ED Triage Notes (Signed)
Pt c/o L eye redness swelling and drainage x1 month.
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
## 2017-06-13 ENCOUNTER — Ambulatory Visit (HOSPITAL_COMMUNITY)
Admission: EM | Admit: 2017-06-13 | Discharge: 2017-06-13 | Disposition: A | Discharge status: Home / Self Care | Payer: Medicaid Other

## 2017-06-13 ENCOUNTER — Emergency Department (HOSPITAL_COMMUNITY)
Admission: EM | Admit: 2017-06-13 | Discharge: 2017-06-13 | Disposition: A | Discharge status: Home / Self Care | Payer: Medicaid Other | Attending: Emergency Medicine | Admitting: Emergency Medicine

## 2017-06-13 ENCOUNTER — Other Ambulatory Visit: Payer: Self-pay

## 2017-06-13 ENCOUNTER — Encounter (HOSPITAL_COMMUNITY): Payer: Self-pay | Admitting: Emergency Medicine

## 2017-06-13 DIAGNOSIS — R1033 Periumbilical pain: Secondary | ICD-10-CM | POA: Insufficient documentation

## 2017-06-13 DIAGNOSIS — J45909 Unspecified asthma, uncomplicated: Secondary | ICD-10-CM | POA: Insufficient documentation

## 2017-06-13 DIAGNOSIS — N760 Acute vaginitis: Secondary | ICD-10-CM | POA: Insufficient documentation

## 2017-06-13 DIAGNOSIS — F17210 Nicotine dependence, cigarettes, uncomplicated: Secondary | ICD-10-CM | POA: Insufficient documentation

## 2017-06-13 DIAGNOSIS — R109 Unspecified abdominal pain: Secondary | ICD-10-CM

## 2017-06-13 DIAGNOSIS — Z79899 Other long term (current) drug therapy: Secondary | ICD-10-CM | POA: Insufficient documentation

## 2017-06-13 DIAGNOSIS — M545 Low back pain: Secondary | ICD-10-CM | POA: Insufficient documentation

## 2017-06-13 DIAGNOSIS — R112 Nausea with vomiting, unspecified: Secondary | ICD-10-CM | POA: Insufficient documentation

## 2017-06-13 DIAGNOSIS — B9689 Other specified bacterial agents as the cause of diseases classified elsewhere: Secondary | ICD-10-CM

## 2017-06-13 LAB — I-STAT BETA HCG BLOOD, ED (MC, WL, AP ONLY)

## 2017-06-13 LAB — COMPREHENSIVE METABOLIC PANEL
ALBUMIN: 4.6 g/dL (ref 3.5–5.0)
ALT: 20 U/L (ref 14–54)
AST: 23 U/L (ref 15–41)
Alkaline Phosphatase: 60 U/L (ref 38–126)
Anion gap: 10 (ref 5–15)
BUN: 5 mg/dL — AB (ref 6–20)
CHLORIDE: 107 mmol/L (ref 101–111)
CO2: 21 mmol/L — AB (ref 22–32)
Calcium: 10.9 mg/dL — ABNORMAL HIGH (ref 8.9–10.3)
Creatinine, Ser: 0.8 mg/dL (ref 0.44–1.00)
GFR calc Af Amer: >60 mL/min (ref >60)
GFR calc non Af Amer: >60 mL/min (ref >60)
GLUCOSE: 89 mg/dL (ref 65–99)
POTASSIUM: 3.9 mmol/L (ref 3.5–5.1)
SODIUM: 138 mmol/L (ref 135–145)
Total Bilirubin: 1.4 mg/dL — ABNORMAL HIGH (ref 0.3–1.2)
Total Protein: 8.1 g/dL (ref 6.5–8.1)

## 2017-06-13 LAB — CBC
HEMATOCRIT: 44.6 % (ref 36.0–46.0)
Hemoglobin: 15 g/dL (ref 12.0–15.0)
MCH: 32 pg (ref 26.0–34.0)
MCHC: 33.6 g/dL (ref 30.0–36.0)
MCV: 95.1 fL (ref 78.0–100.0)
Platelets: 285 10*3/uL (ref 150–400)
RBC: 4.69 MIL/uL (ref 3.87–5.11)
RDW: 12.7 % (ref 11.5–15.5)
WBC: 4.8 10*3/uL (ref 4.0–10.5)

## 2017-06-13 LAB — URINALYSIS, ROUTINE W REFLEX MICROSCOPIC
BILIRUBIN URINE: NEGATIVE
Glucose, UA: NEGATIVE mg/dL
Ketones, ur: NEGATIVE mg/dL
Leukocytes, UA: NEGATIVE
Nitrite: NEGATIVE
PROTEIN: NEGATIVE mg/dL
Specific Gravity, Urine: 1.019 (ref 1.005–1.030)
pH: 5 (ref 5.0–8.0)

## 2017-06-13 LAB — WET PREP, GENITAL
Sperm: NONE SEEN
TRICH WET PREP: NONE SEEN
Yeast Wet Prep HPF POC: NONE SEEN

## 2017-06-13 LAB — LIPASE, BLOOD: LIPASE: 24 U/L (ref 11–51)

## 2017-06-13 MED ORDER — ONDANSETRON 4 MG PO TBDP
4.0000 mg | ORAL_TABLET | Freq: Once | ORAL | Status: AC
  Administered 2017-06-13: 4 mg via ORAL
  Filled 2017-06-13: qty 1

## 2017-06-13 MED ORDER — IBUPROFEN 800 MG PO TABS
800.0000 mg | ORAL_TABLET | Freq: Once | ORAL | Status: AC
Start: 2017-06-13 — End: 2017-06-13
  Administered 2017-06-13: 800 mg via ORAL
  Filled 2017-06-13: qty 1

## 2017-06-13 MED ORDER — ONDANSETRON HCL 4 MG/2ML IJ SOLN
4.0000 mg | Freq: Once | INTRAMUSCULAR | Status: DC
  Filled 2017-06-13: qty 2

## 2017-06-13 MED ORDER — METRONIDAZOLE 500 MG PO TABS: 500.0000 mg | ORAL_TABLET | Freq: Two times a day (BID) | ORAL | 0 refills | Status: DC

## 2017-06-13 MED ORDER — SODIUM CHLORIDE 0.9 % IV BOLUS (SEPSIS): 500.0000 mL | Freq: Once | INTRAVENOUS | Status: DC

## 2017-06-13 MED ORDER — KETOROLAC TROMETHAMINE 15 MG/ML IJ SOLN
30.0000 mg | Freq: Once | INTRAMUSCULAR | Status: DC
  Filled 2017-06-13: qty 2

## 2017-06-13 NOTE — ED Provider Notes (Signed)
[facility] EMERGENCY DEPARTMENT Provider Note   CSN: [id number] Arrival date & time: 06/13/17  1349     History   Chief Complaint Chief Complaint  Patient presents with  . Abdominal Pain  . Back Pain    HPI Jenna Henry is a 22 y.o. female.  HPI patient is a 22 year old female G0P0 with a history of PCOS who presents the ED complaining of periumbilical abdominal pain, nausea (x4 days) and vomiting (1 episode).  Abdominal pain began about 1 week ago and worsened about 4 days ago.  Pain is intermittent and crampy in nature.  Also complaining of lower back pain across bilateral lower back. States that she gets this pain when she is on her menses. Denies urinary incontinence/retention, no weakness/numbness to BLE, no saddle anesthesia, no fevers or weight loss, and no h/o CA or IVDU. No recent falls/trauma/injury. Further denies diarrhea, constipation, blood in stool.  Reports vaginal spotting today and increased vaginal discharge with no odor, irritation, or lesions. LMP 12/12. Menses are irregular. Deferred HIV and RPR testing.   Past Medical History:  Diagnosis Date  . Anxiety   . Asthma   . Depression   . Herpes   . Polycystic ovaries     Patient Active Problem List   Diagnosis Date Noted  . Severe major depression without psychotic features (HCC) 02/28/2017  . 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 Suicide attempt (HCC) 02/28/2017  . Anxiety state 02/28/2017  . Insomnia 02/28/2017  . Herpes 11/12/2016  . Irregular menstruation 07/06/2016    Past Surgical History:  Procedure Laterality Date  . CYSTECTOMY      OB History    No data available       Home Medications    Prior to Admission medications   Medication Sig Start Date End Date Taking? Authorizing Provider  albuterol (PROAIR HFA) 108 (90 Base) MCG/ACT inhaler Inhale 2 puffs every 6 (six) hours as needed into the lungs for wheezing or  shortness of breath. 04/16/17   Lizbeth Bark, FNP  butalbital-acetaminophen-caffeine (FIORICET, ESGIC) [phone] MG tablet Take 1-2 tablets by mouth every 6 (six) hours as needed for headache. 05/28/17 05/28/18  Kirichenko, Lemont Fillers, PA-C  dicyclomine (BENTYL) 10 MG capsule Take 1 capsule (10 mg total) by mouth 4 (four) times daily -  before meals and at bedtime. Patient not taking: Reported on 04/28/2017 03/09/17   Sharlene Dory, DO  fluticasone [facility]) 50 MCG/ACT nasal spray Place 1 spray into both nostrils daily. 05/28/17   Kirichenko, Lemont Fillers, PA-C  hydrOXYzine (ATARAX/VISTARIL) 25 MG tablet Take 1 tablet (25 mg total) by mouth 3 (three) times daily as needed for anxiety. Patient not taking: Reported on 04/28/2017 03/02/17   Oneta Rack, NP  ibuprofen (ADVIL,MOTRIN) 600 MG tablet Take 1 tablet (600 mg total) by mouth every 8 (eight) hours as needed for headache (Take with food). 05/14/17   Lizbeth Bark, FNP  metroNIDAZOLE (FLAGYL) 500 MG tablet Take 1 tablet (500 mg total) by mouth 2 (two) times daily. 06/13/17   Brockton Mckesson S, PA-C  promethazine (PHENERGAN) 6.25 MG/5ML syrup Take 5 mLs (6.25 mg total) by mouth every 6 (six) hours as needed for nausea or vomiting. 03/09/17   Sharlene Dory, DO  tobramycin (TOBREX) 0.3 % ophthalmic solution Place 1 drop into the left eye every 6 (six) hours. 06/05/17   Mardella Layman, MD  traZODone (DESYREL) 50 MG tablet Take 1 tablet (50 mg total)  by mouth at bedtime as needed for sleep. Patient not taking: Reported on 04/28/2017 03/02/17   Oneta RackLewis, Tanika N, NP  venlafaxine XR (EFFEXOR-XR) 75 MG 24 hr capsule Take 1 capsule (75 mg total) by mouth daily with breakfast. 05/14/17   Lizbeth BarkHairston, Mandesia R, FNP    Family History Family History  Problem Relation Age of Onset  . Hypertension Mother     Social History Social History   Tobacco Use  . Smoking status: Current Every Day Smoker    Packs/day: 1.00  . Smokeless  tobacco: Never Used  . Tobacco comment: pt reports smokeing 1 Black and mild daily  Substance Use Topics  . Alcohol use: No  . Drug use: Yes    Types: Marijuana     Allergies   Ortho tri-cyclen [norgestimate-eth estradiol]   Review of Systems Review of Systems  Constitutional: Negative for chills and fever.  HENT: Negative for ear pain and sore throat.   Eyes: Negative for pain and visual disturbance.  Respiratory: Negative for cough and shortness of breath.   Cardiovascular: Negative for chest pain and palpitations.  Gastrointestinal: Positive for abdominal pain, nausea and vomiting. Negative for abdominal distention, blood in stool, constipation and diarrhea.  Genitourinary: Positive for vaginal bleeding and vaginal discharge. Negative for dysuria, flank pain, frequency, hematuria, urgency and vaginal pain.  Musculoskeletal: Positive for back pain. Negative for arthralgias and neck pain.  Skin: Negative for color change and rash.  Neurological: Negative for seizures and syncope.  All other systems reviewed and are negative.    Physical Exam Updated Vital Signs BP (!) 111/58 (BP Location: Right Arm) Comment: Simultaneous filing. User may not have seen previous data.  Pulse (!) 57 Comment: Simultaneous filing. User may not have seen previous data.  Temp 98.5 F (36.9 C) (Oral)   Resp 16   LMP 04/26/2017 (Exact Date)   SpO2 99% Comment: Simultaneous filing. User may not have seen previous data.  Physical Exam  Constitutional: She appears well-developed and well-nourished. She does not appear ill. No distress.  HENT:  Head: Normocephalic and atraumatic.  Eyes: Conjunctivae and EOM are normal. Pupils are equal, round, and reactive to light.  Neck: Neck supple.  Cardiovascular: Normal rate, regular rhythm, normal heart sounds and intact distal pulses.  No murmur heard. Pulmonary/Chest: Effort normal. No respiratory distress. She has no wheezes. She has no rhonchi.    Abdominal: Soft. Normal appearance and bowel sounds are normal. She exhibits no distension, no ascites and no mass. There is tenderness (mild) in the periumbilical area. There is no rigidity, no rebound, no guarding, no CVA tenderness, no tenderness at McBurney's point and negative Murphy's sign.  Genitourinary:  Genitourinary Comments: Exam chaperoned Pelvic exam: normal external genitalia without evidence of trauma. VULVA: normal appearing vulva with no masses, tenderness or lesion. VAGINA: normal appearing vagina with normal color and discharge, no lesions. Scant amount of bright red blood in the vaginal vault. CERVIX: normal appearing cervix without lesions, cervical motion tenderness absent, cervical os closed with out purulent discharge; vaginal discharge negative, Wet prep and DNA probe for chlamydia and GC obtained.  ADNEXA: normal adnexa in size, nontender and no masses UTERUS: uterus is normal size, shape, consistency and nontender.   Musculoskeletal: She exhibits no edema.  No midline TTP. TTP to left sciatic notch. Full and painless ROM of back with flexion/extention/rotation.  Neurological: She is alert.  Mental Status:  Alert, thought content appropriate, able to give a coherent history. Speech fluent without evidence  of aphasia. Able to follow 2 step commands without difficulty.  Normal tone. 5/5 strength of BLE major muscle groups including dorsiflexion/plantar flexion Sensory: light touch normal in all extremities. Gait: normal gait and balance. Able to walk on toes and heels with ease.  CV: 2+ radial and DP/PT pulses  Skin: Skin is warm and dry. Capillary refill takes less than 2 seconds.  Psychiatric: She has a normal mood and affect.  Nursing note and vitals reviewed.    ED Treatments / Results  Labs (all labs ordered are listed, but only abnormal results are displayed) Labs Reviewed  WET PREP, GENITAL - Abnormal; Notable for the following components:      Result  Value   Clue Cells Wet Prep HPF POC PRESENT (*)    WBC, Wet Prep HPF POC FEW (*)    All other components within normal limits  COMPREHENSIVE METABOLIC PANEL - Abnormal; Notable for the following components:   CO2 21 (*)    BUN 5 (*)    Calcium 10.9 (*)    Total Bilirubin 1.4 (*)    All other components within normal limits  URINALYSIS, ROUTINE W REFLEX MICROSCOPIC - Abnormal; Notable for the following components:   Hgb urine dipstick LARGE (*)    Bacteria, UA RARE (*)    Squamous Epithelial / LPF 0-5 (*)    All other components within normal limits  LIPASE, BLOOD  CBC  I-STAT BETA HCG BLOOD, ED ([facility], [facility], [facility] ONLY)  GC/CHLAMYDIA PROBE AMP ([place]) NOT AT [facility]    EKG  EKG Interpretation None       Radiology No results found.  Procedures Procedures (including critical care time)  Medications Ordered in ED Medications  ondansetron (ZOFRAN-ODT) disintegrating tablet 4 mg (4 mg Oral Given 06/13/17 1633)  ibuprofen (ADVIL,MOTRIN) tablet 800 mg (800 mg Oral Given 06/13/17 1634)     Initial Impression / Assessment and Plan / ED Course  I have reviewed the triage vital signs and the nursing notes.  Pertinent labs & imaging results that were available during my care of the patient were reviewed by me and considered in my medical decision making (see chart for details).   Pt declines RPR and HIV testing at this time. Pt also declining IV.  Will order Zofran ODT and ibuprofen as patient states this has helped her pain in the past.  Rechecked patient.  Repeat abdominal exam was reassuring.  Abdomen soft, bowel sounds normal, no tenderness.  Patient states she feels better and has no more nausea.  Discussed the results of the lab work and that I will be sending her home with Flagyl.  Advised not to drink ETOH on this.  Had a long discussion about return precautions and advised her to follow-up within 24 hours if she is still having abd pain or if her pain is any worse.  Advised her  to follow-up if she is experiencing any new symptoms as well.  Advised primary care follow-up within 1 week.  All questions were answered and patient understands.  Final Clinical Impressions(s) / ED Diagnoses   Final diagnoses:  Bacterial vaginosis  Abdominal pain, unspecified abdominal location   22 y/o with c/o 1 week h/o abd pain and 1 episode of vomiting.   Patient is nontoxic, nonseptic appearing, in no apparent distress. Patient's pain and other symptoms adequately managed in emergency department.  Fluid bolus was refused by pt, but she is tolerating PO and has had no episodes of vomiting.  Patient does not meet the SIRS or Sepsis criteria.  On repeat exam patient does not have a surgical abdomin and there are no peritoneal signs.  No indication of appendicitis, bowel obstruction, bowel perforation, cholecystitis, diverticulitis, PID or ectopic pregnancy. CBC, CMP, lipase, and istat pregnancy test negative. UA with hematuria likely from menses, negative for UTI. Pelvic exam with no cervical motion, uterine, or adnexal TTP. Exam not concerning for torsion. Wet prep with clue cells and WBCs, suggestive of BV. Will tx with flagyl and advised f/u. Sent gc/chlamydia and informed pt that she will be contacted if results are positive. She declined RPR and HIV.    Patient discharged home with strict instructions for follow-up with their primary care physician.  I have also discussed reasons to return immediately to the ER.  Patient expresses understanding and agrees with plan.    ED Discharge Orders        Ordered    metroNIDAZOLE (FLAGYL) 500 MG tablet  2 times daily     06/13/17 1700       Karrie Meres, PA-C 06/13/17 2221    Azalia Bilis, MD 06/14/17 2314

## 2017-06-13 NOTE — ED Notes (Signed)
Pelvic cart at the bedside 

## 2017-06-13 NOTE — ED Notes (Signed)
Was asked by the front to talk to pt  Pt sts she is having severe abd pain that is 9/10 Sts she has hx of PCOS and believes some rupture has occurred Pt wants to r/o rupture Notified here that we do not have US or CT/MRI but we would see her here Also notified her that if we can't help here the provider [facility]  Pt and mother sts that they will go to ED instead first.

## 2017-06-13 NOTE — ED Notes (Signed)
Pt declined IV, PA made aware.

## 2017-06-13 NOTE — Discharge Instructions (Signed)
You were given a prescription for antibiotics. Please take the antibiotic prescription fully.  Do not drink alcohol while taking this medication.   Please follow-up with your primary care doctor within 1 week. Please return to the ER within 24 hours if you are still experiencing abdominal pain or if your abdominal pain is worsening.  Return to the ER for any new or worsening symptoms.

## 2017-06-13 NOTE — ED Triage Notes (Signed)
Pt reports periumbilical pain x 7 days with n/v, also c/o lower back pain. Pt states, "There's blood on the toilet paper when I wipe."  LMP 04/26/17

## 2017-06-14 LAB — GC/CHLAMYDIA PROBE AMP (~~LOC~~) NOT AT ARMC
CHLAMYDIA, DNA PROBE: NEGATIVE
NEISSERIA GONORRHEA: NEGATIVE

## 2017-06-29 ENCOUNTER — Encounter: Payer: Self-pay | Admitting: Family Medicine

## 2017-06-29 ENCOUNTER — Other Ambulatory Visit: Payer: Self-pay

## 2017-06-29 ENCOUNTER — Ambulatory Visit (HOSPITAL_COMMUNITY)
Admission: RE | Admit: 2017-06-29 | Discharge: 2017-06-29 | Disposition: A | Discharge status: Home / Self Care | Payer: Medicaid Other | Source: Ambulatory Visit | Attending: Family Medicine | Admitting: Family Medicine

## 2017-06-29 ENCOUNTER — Ambulatory Visit: Payer: Self-pay | Attending: Family Medicine | Admitting: Family Medicine

## 2017-06-29 VITALS — BP 104/73 | HR 67 | Temp 98.8°F

## 2017-06-29 DIAGNOSIS — Z79899 Other long term (current) drug therapy: Secondary | ICD-10-CM | POA: Insufficient documentation

## 2017-06-29 DIAGNOSIS — Z87898 Personal history of other specified conditions: Secondary | ICD-10-CM

## 2017-06-29 DIAGNOSIS — Z87828 Personal history of other (healed) physical injury and trauma: Secondary | ICD-10-CM | POA: Insufficient documentation

## 2017-06-29 DIAGNOSIS — Z9114 Patient's other noncompliance with medication regimen: Secondary | ICD-10-CM | POA: Insufficient documentation

## 2017-06-29 DIAGNOSIS — R55 Syncope and collapse: Secondary | ICD-10-CM | POA: Insufficient documentation

## 2017-06-29 DIAGNOSIS — F332 Major depressive disorder, recurrent severe without psychotic features: Secondary | ICD-10-CM | POA: Insufficient documentation

## 2017-06-29 DIAGNOSIS — Z8659 Personal history of other mental and behavioral disorders: Secondary | ICD-10-CM

## 2017-06-29 DIAGNOSIS — R440 Auditory hallucinations: Secondary | ICD-10-CM | POA: Insufficient documentation

## 2017-06-29 MED ORDER — VENLAFAXINE HCL ER 75 MG PO CP24: 75.0000 mg | ORAL_CAPSULE | Freq: Every day | ORAL | 2 refills | Status: DC

## 2017-06-29 MED FILL — VENLAFAXINE HCL ER 75 MG CA: 75 | 30 days supply | Qty: 30 | Fill #0
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
## 2017-06-29 NOTE — Progress Notes (Signed)
Follow up head ache Syncope episodes

## 2017-06-29 NOTE — Patient Instructions (Addendum)
Apply for orange card and discount program.  Major Depressive Disorder, Adult Major depressive disorder (MDD) is a mental health condition. MDD often makes you feel sad, hopeless, or helpless. MDD can also cause symptoms in your body. MDD can affect your:  Work.  School.  Relationships.  Other normal activities.  MDD can range from mild to very bad. It may occur once (single episode MDD). It can also occur many times (recurrent MDD). The main symptoms of MDD often include:  Feeling sad, depressed, or irritable most of the time.  Loss of interest.  MDD symptoms also include:  Sleeping too much or too little.  Eating too much or too little.  A change in your weight.  Feeling tired (fatigue) or having low energy.  Feeling worthless.  Feeling guilty.  Trouble making decisions.  Trouble thinking clearly.  Thoughts of suicide or harming others.  Feeling weak.  Feeling agitated.  Keeping yourself from being around other people (isolation).  Follow these instructions at home: Activity  Do these things as told by your doctor: ? Go back to your normal activities. ? Exercise regularly. ? Spend time outdoors. Alcohol  Talk with your doctor about how alcohol can affect your antidepressant medicines.  Do not drink alcohol. Or, limit how much alcohol you drink. ? This means no more than 1 drink a day for nonpregnant women and 2 drinks a day for men. One drink equals one of these:  12 oz of beer.  5 oz of wine.  1 oz of hard liquor. General instructions  Take over-the-counter and prescription medicines only as told by your doctor.  Eat a healthy diet.  Get plenty of sleep.  Find activities that you enjoy. Make time to do them.  Think about joining a support group. Your doctor may be able to suggest a group for you.  Keep all follow-up visits as told by your doctor. This is important. Where to find more information:  [employer] Mental  Illness: ? www.nami.org  [employer] Mental Health: ? http://www.maynard.net/www.nimh.nih.gov  National Suicide Prevention Lifeline: ? (253)776-44171-726-118-8106. This is free, 24-hour help. Contact a doctor if:  Your symptoms get worse.  You have new symptoms. Get help right away if:  You self-harm.  You see, hear, taste, smell, or feel things that are not present (hallucinate). If you ever feel like you may hurt yourself or others, or have thoughts about taking your own life, get help right away. You can go to your nearest emergency department or call:  Your local emergency services (911 in the [place]  A suicide crisis helpline, such as the National Suicide Prevention Lifeline: ? 478-053-02121-726-118-8106. This is open 24 hours a day.  This information is not intended to replace advice given to you by your health care provider. Make sure you discuss any questions you have with your health care provider. Document Released: 04/29/2015 Document Revised: 02/02/2016 Document Reviewed: 02/02/2016 Elsevier Interactive Patient Education  2017 [employer]

## 2017-07-15 ENCOUNTER — Encounter: Payer: Self-pay | Admitting: Family Medicine

## 2017-07-15 ENCOUNTER — Other Ambulatory Visit: Payer: Self-pay

## 2017-07-15 ENCOUNTER — Ambulatory Visit: Payer: Self-pay | Attending: Family Medicine | Admitting: Family Medicine

## 2017-07-15 VITALS — BP 101/70 | HR 74 | Temp 98.8°F | Resp 16 | Wt 187.0 lb

## 2017-07-15 DIAGNOSIS — Z87820 Personal history of traumatic brain injury: Secondary | ICD-10-CM

## 2017-07-15 DIAGNOSIS — R51 Headache: Secondary | ICD-10-CM | POA: Insufficient documentation

## 2017-07-15 DIAGNOSIS — F333 Major depressive disorder, recurrent, severe with psychotic symptoms: Secondary | ICD-10-CM

## 2017-07-15 DIAGNOSIS — R519 Headache, unspecified: Secondary | ICD-10-CM

## 2017-07-15 DIAGNOSIS — Z79899 Other long term (current) drug therapy: Secondary | ICD-10-CM | POA: Insufficient documentation

## 2017-07-15 DIAGNOSIS — G8929 Other chronic pain: Secondary | ICD-10-CM

## 2017-07-15 MED ORDER — IBUPROFEN 600 MG PO TABS: 600.0000 mg | ORAL_TABLET | Freq: Three times a day (TID) | ORAL | 1 refills | Status: DC | PRN
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
## 2017-07-15 NOTE — Progress Notes (Signed)
Follow up headaches  Denies dizziness or syncope episodes.  "Feels better"

## 2017-07-20 ENCOUNTER — Encounter: Payer: Self-pay | Admitting: Neurology

## 2017-08-25 ENCOUNTER — Ambulatory Visit (INDEPENDENT_AMBULATORY_CARE_PROVIDER_SITE_OTHER): Payer: Self-pay | Admitting: Neurology

## 2017-08-25 ENCOUNTER — Encounter: Payer: Self-pay | Admitting: Neurology

## 2017-08-25 VITALS — BP 102/76 | HR 70 | Resp 16 | Ht 65.0 in | Wt 182.2 lb

## 2017-08-25 DIAGNOSIS — G44219 Episodic tension-type headache, not intractable: Secondary | ICD-10-CM

## 2017-08-25 NOTE — Patient Instructions (Signed)
1.  Continue taking ibuprofen for severe headaches if needed (no more than 2 days out of the week) 2.  Headaches are infrequent so I wouldn't start you on a daily medication.  If they become worse, please follow up
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
## 2017-09-10 ENCOUNTER — Encounter (HOSPITAL_COMMUNITY): Payer: Self-pay | Admitting: Emergency Medicine

## 2017-09-10 ENCOUNTER — Other Ambulatory Visit: Payer: Self-pay

## 2017-09-10 ENCOUNTER — Emergency Department (HOSPITAL_COMMUNITY)
Admission: EM | Admit: 2017-09-10 | Discharge: 2017-09-10 | Disposition: A | Discharge status: Home / Self Care | Payer: Medicaid Other | Attending: Emergency Medicine | Admitting: Emergency Medicine

## 2017-09-10 DIAGNOSIS — Z8742 Personal history of other diseases of the female genital tract: Secondary | ICD-10-CM

## 2017-09-10 DIAGNOSIS — F17200 Nicotine dependence, unspecified, uncomplicated: Secondary | ICD-10-CM | POA: Insufficient documentation

## 2017-09-10 DIAGNOSIS — F419 Anxiety disorder, unspecified: Secondary | ICD-10-CM | POA: Insufficient documentation

## 2017-09-10 DIAGNOSIS — R1032 Left lower quadrant pain: Secondary | ICD-10-CM | POA: Insufficient documentation

## 2017-09-10 DIAGNOSIS — J45909 Unspecified asthma, uncomplicated: Secondary | ICD-10-CM | POA: Insufficient documentation

## 2017-09-10 DIAGNOSIS — R0602 Shortness of breath: Secondary | ICD-10-CM | POA: Insufficient documentation

## 2017-09-10 DIAGNOSIS — R072 Precordial pain: Secondary | ICD-10-CM | POA: Insufficient documentation

## 2017-09-10 LAB — RAPID HIV SCREEN (HIV 1/2 AB+AG)
HIV 1/2 Antibodies: NONREACTIVE
HIV-1 P24 ANTIGEN - HIV24: NONREACTIVE

## 2017-09-10 LAB — WET PREP, GENITAL
Clue Cells Wet Prep HPF POC: NONE SEEN
SPERM: NONE SEEN
TRICH WET PREP: NONE SEEN
YEAST WET PREP: NONE SEEN

## 2017-09-10 LAB — POC URINE PREG, ED: Preg Test, Ur: NEGATIVE

## 2017-09-10 MED ORDER — IBUPROFEN 800 MG PO TABS
800.0000 mg | ORAL_TABLET | Freq: Once | ORAL | Status: AC
  Administered 2017-09-10: 800 mg via ORAL
  Filled 2017-09-10: qty 1

## 2017-09-10 MED ORDER — IBUPROFEN 600 MG PO TABS: 600.0000 mg | ORAL_TABLET | Freq: Four times a day (QID) | ORAL | 0 refills | Status: DC | PRN

## 2017-09-10 MED ORDER — HYDROXYZINE HCL 25 MG PO TABS
25.0000 mg | ORAL_TABLET | Freq: Once | ORAL | Status: AC
  Administered 2017-09-10: 25 mg via ORAL
  Filled 2017-09-10: qty 1

## 2017-09-10 NOTE — ED Notes (Signed)
Discharged vitals obtained. Awaiting nurse for paperwork.

## 2017-09-10 NOTE — ED Notes (Signed)
Tried to get patient blood in 2 different areas and I didn't have any success.

## 2017-09-10 NOTE — ED Triage Notes (Signed)
Pt arrives via EMS from home with lower abdominal pain for the last week. Pt called EMS due to SOB that began about an hour ago. Began to have CP on arrival to ED. Pt has ASA allergy. EMS EKG showed sinus arrhythmia. Skin, warm, dry, pt NAD at present. VSS.

## 2017-09-10 NOTE — ED Notes (Signed)
D/v reviewed with patient. No further questions at this time

## 2017-09-10 NOTE — Discharge Instructions (Signed)
Your lower abdominal pain may relate to ovarian cyst pain.  Take ibuprofen as needed for pain.  Return if you develop fever, worsening pain or if you have other concerns.

## 2017-09-10 NOTE — ED Provider Notes (Signed)
[facility] EMERGENCY DEPARTMENT Provider Note   CSN: [id number] Arrival date & time: 09/10/17  7829     History   Chief Complaint Chief Complaint  Patient presents with  . Abdominal Pain    HPI Jenna Henry is a 22 y.o. female.  HPI   22 year old female with history of asthma, anxiety, depression, polycystic ovarian syndrome brought here via EMS from home for evaluation of chest pain or shortness of breath.  Patient report for the past week she has had pain to her left lower abdomen.  Pain is sharp, crampy, recurrent, and similar to prior pain that she had in the past.  Pain is waxing and waning.  She reports feeling stress for the past week as well and today, she feels as if everything comes her head when she developed substernal chest pressure sensation and shortness of breath and subsequently contacted EMS to bring her here.  Right now her pressure is rated as 6 out of 10.  She is also occasional smoker.  She denies but no sneezing or coughing.  She denies urinary symptoms, no burning in urination no vaginal bleeding or vaginal discharge.  She does have history of PCO S.  She denies any specific treatment tried.  Past Medical History:  Diagnosis Date  . Anxiety   . Asthma   . Depression   . Herpes   . Polycystic ovaries     Patient Active Problem List   Diagnosis Date Noted  . Severe major depression without psychotic features (HCC) 02/28/2017  . 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 Suicide attempt (HCC) 02/28/2017  . Anxiety state 02/28/2017  . Insomnia 02/28/2017  . Herpes 11/12/2016  . Irregular menstruation 07/06/2016    Past Surgical History:  Procedure Laterality Date  . CYSTECTOMY       OB History   None      Home Medications    Prior to Admission medications   Not on File    Family History Family History  Problem Relation Age of Onset  . Hypertension Mother     Social  History Social History   Tobacco Use  . Smoking status: Current Some Day Smoker    Packs/day: 1.00  . Smokeless tobacco: Never Used  . Tobacco comment: pt reports smokeing 1 Black and mild daily  Substance Use Topics  . Alcohol use: No  . Drug use: Yes    Types: Marijuana     Allergies   Ortho tri-cyclen [norgestimate-eth estradiol]   Review of Systems Review of Systems  All other systems reviewed and are negative.    Physical Exam Updated Vital Signs BP 120/88 (BP Location: Left Arm)   Pulse 62   Temp 97.8 F (36.6 C) (Oral)   Resp 14   SpO2 100%   Physical Exam  Constitutional: She appears well-developed and well-nourished. No distress.  HENT:  Head: Atraumatic.  Eyes: Conjunctivae are normal.  Neck: Neck supple.  Cardiovascular: Normal rate and regular rhythm.  Pulmonary/Chest: Effort normal and breath sounds normal.  Abdominal: Normal appearance and bowel sounds are normal. There is tenderness in the left lower quadrant.  Genitourinary:  Genitourinary Comments: Chaperone present during exam.  No inguinal lymphadenopathy or inguinal hernia.  Normal external genitalia.  Mild discomfort with speculum insertion.  Normal vaginal vault without any significant discharge.  Cervical os closed, free of lesion or rash.  On bimanual examination, left adnexal tenderness without cervical motion tenderness.  Exam is  limited due to patient's body habitus.  Neurological: She is alert.  Skin: No rash noted.  Psychiatric: She has a normal mood and affect.  Nursing note and vitals reviewed.    ED Treatments / Results  Labs (all labs ordered are listed, but only abnormal results are displayed) Labs Reviewed  WET PREP, GENITAL - Abnormal; Notable for the following components:      Result Value   WBC, Wet Prep HPF POC FEW (*)    All other components within normal limits  RAPID HIV SCREEN (HIV 1/2 AB+AG)  RPR  POC URINE PREG, ED  GC/CHLAMYDIA PROBE AMP ([facility]) NOT AT  [facility]    EKG None  ED ECG REPORT   Date: 09/10/2017  Rate: 51  Rhythm: sinus bradycardia  QRS Axis: normal  Intervals: normal  ST/T Wave abnormalities: normal  Conduction Disutrbances:none  Narrative Interpretation:   Old EKG Reviewed: unchanged  I have personally reviewed the EKG tracing and agree with the computerized printout as noted.   Radiology No results found.  Procedures Procedures (including critical care time)  Medications Ordered in ED Medications  hydrOXYzine (ATARAX/VISTARIL) tablet 25 mg (25 mg Oral Given 09/10/17 1019)  ibuprofen (ADVIL,MOTRIN) tablet 800 mg (800 mg Oral Given 09/10/17 1202)     Initial Impression / Assessment and Plan / ED Course  I have reviewed the triage vital signs and the nursing notes.  Pertinent labs & imaging results that were available during my care of the patient were reviewed by me and considered in my medical decision making (see chart for details).     BP 105/69   Pulse (!) 56   Temp 97.8 F (36.6 C) (Oral)   Resp 18   SpO2 99%    Final Clinical Impressions(s) / ED Diagnoses   Final diagnoses:  Left lower quadrant pain  History of PCOS    ED Discharge Orders        Ordered    ibuprofen (ADVIL,MOTRIN) 600 MG tablet  Every 6 hours PRN     09/10/17 1312     9:28 AM Patient here with left-sided abdominal pain ongoing for the past week.  Currently she is more focused on her chest pressure and shortness of breath.  Does have history of anxiety and I felt this is likely anxiety driven as patient endorsed increased stress within the past week as well.  She has had occasional smoker but her symptom is not typical of ACS.  1:13 PM Tenderness to left adnexal region on exam without cervical motion tenderness.  Wet prep unremarkable, pregnancy test is normal, EKG with sinus arrhythmia but no concerning ischemic changes.  I have low suspicion for PID.  I suspect her pain is likely related to ovarian cyst at this time.  I  doubt acute abdominal pathology.  Low suspicion for appendicitis or diverticulitis.  Given the prolonged duration of her condition I have low suspicion for ovarian torsion, tubo-ovarian abscess or ectopic pregnancy.    Fayrene Helperran, Jaysten Essner, PA-C 09/10/17 1317    Mesner, Barbara CowerJason, MD 09/10/17 [phone]

## 2017-09-11 LAB — RPR: RPR Ser Ql: NONREACTIVE

## 2017-09-13 LAB — GC/CHLAMYDIA PROBE AMP (~~LOC~~) NOT AT ARMC
Chlamydia: NEGATIVE
NEISSERIA GONORRHEA: NEGATIVE

## 2017-09-24 ENCOUNTER — Ambulatory Visit: Payer: Self-pay | Attending: Internal Medicine | Admitting: Internal Medicine

## 2017-09-24 ENCOUNTER — Encounter: Payer: Self-pay | Admitting: Internal Medicine

## 2017-09-24 VITALS — BP 110/77 | HR 54 | Temp 98.1°F | Resp 16 | Ht 65.0 in | Wt 178.6 lb

## 2017-09-24 DIAGNOSIS — R001 Bradycardia, unspecified: Secondary | ICD-10-CM

## 2017-09-24 DIAGNOSIS — Z8249 Family history of ischemic heart disease and other diseases of the circulatory system: Secondary | ICD-10-CM | POA: Insufficient documentation

## 2017-09-24 DIAGNOSIS — Z888 Allergy status to other drugs, medicaments and biological substances status: Secondary | ICD-10-CM | POA: Insufficient documentation

## 2017-09-24 DIAGNOSIS — Z8619 Personal history of other infectious and parasitic diseases: Secondary | ICD-10-CM | POA: Insufficient documentation

## 2017-09-24 DIAGNOSIS — E282 Polycystic ovarian syndrome: Secondary | ICD-10-CM | POA: Insufficient documentation

## 2017-09-24 DIAGNOSIS — F17210 Nicotine dependence, cigarettes, uncomplicated: Secondary | ICD-10-CM | POA: Insufficient documentation

## 2017-09-24 DIAGNOSIS — Z131 Encounter for screening for diabetes mellitus: Secondary | ICD-10-CM

## 2017-09-24 DIAGNOSIS — Z9889 Other specified postprocedural states: Secondary | ICD-10-CM | POA: Insufficient documentation

## 2017-09-24 DIAGNOSIS — Z79899 Other long term (current) drug therapy: Secondary | ICD-10-CM | POA: Insufficient documentation

## 2017-09-24 NOTE — Progress Notes (Signed)
Patient ID: Jenna ManisLaray Shave, female    DOB: 05/10/1996  MRN: [id number]  CC: re-establish and Hospitalization Follow-up   Subjective: Jenna Henry is a 22 y.o. female who presents for hospital follow-up and to establish with me as PCP.  Previous PCP was NP Hairston. Her concerns today include:  Patient with history of asthma, anxiety/depression, PCOS.  Seen in ER 09/10/2017 for CP and LLQ abdominal pain.  Work up of abdominal pain was nonrevealing. STD check neg.  No further abdominal pain.  Pt reports being told that her pulse rate was also low.  She denies any syncopal episodes or dizziness.  She has a noted weight loss of 10 lbs since 07/2017.  More active and walking to work 5 days a wk (about 15 mins).  -skip meals often but drinks a lot of fluids like tea, gatorade and water.  -In reviewing her chart I noticed recurrent hypercalcemia.  No fhx of hypercalcemia.  Patient denies any constipation, anorexia, nausea/vomiting.  History of kidney stones Patient Active Problem List   Diagnosis Date Noted  . Severe major depression without psychotic features (HCC) 02/28/2017  . 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 Suicide attempt (HCC) 02/28/2017  . Anxiety state 02/28/2017  . Insomnia 02/28/2017  . Herpes 11/12/2016  . Irregular menstruation 07/06/2016     Current Outpatient Medications on File Prior to Visit  Medication Sig Dispense Refill  . albuterol (PROVENTIL HFA;VENTOLIN HFA) 108 (90 Base) MCG/ACT inhaler Inhale 2 puffs into the lungs every 6 (six) hours as needed for wheezing or shortness of breath.    Marland Kitchen. ibuprofen (ADVIL,MOTRIN) 600 MG tablet Take 1 tablet (600 mg total) by mouth every 6 (six) hours as needed. (Patient not taking: Reported on 09/24/2017) 30 tablet 0   No current facility-administered medications on file prior to visit.     Allergies  Allergen Reactions  . Ortho Tri-Cyclen [Norgestimate-Eth Estradiol] Hives, Itching,  Rash and Other (See Comments)    "Burning all over"    Social History   Socioeconomic History  . Marital status: Single    Spouse name: Not on file  . Number of children: Not on file  . Years of education: Not on file  . Highest education level: Not on file  Occupational History  . Not on file  Social Needs  . Financial resource strain: Not on file  . Food insecurity:    Worry: Not on file    Inability: Not on file  . Transportation needs:    Medical: Not on file    Non-medical: Not on file  Tobacco Use  . Smoking status: Current Some Day Smoker    Packs/day: 1.00  . Smokeless tobacco: Never Used  . Tobacco comment: pt reports smokeing 1 Black and mild daily  Substance and Sexual Activity  . Alcohol use: No  . Drug use: Yes    Types: Marijuana  . Sexual activity: Yes  Lifestyle  . Physical activity:    Days per week: Not on file    Minutes per session: Not on file  . Stress: Not on file  Relationships  . Social connections:    Talks on phone: Not on file    Gets together: Not on file    Attends religious service: Not on file    Active member of club or organization: Not on file    Attends meetings of clubs or organizations: Not on file    Relationship status: Not on  file  . Intimate partner violence:    Fear of current or ex partner: Not on file    Emotionally abused: Not on file    Physically abused: Not on file    Forced sexual activity: Not on file  Other Topics Concern  . Not on file  Social History Narrative  . Not on file    Family History  Problem Relation Age of Onset  . Hypertension Mother     Past Surgical History:  Procedure Laterality Date  . CYSTECTOMY      ROS: Review of Systems Neg except as above  PHYSICAL EXAM: BP 110/77   Pulse (!) 54   Temp 98.1 F (36.7 C) (Oral)   Resp 16   Ht 5\' 5"  (1.651 m)   Wt 178 lb 9.6 oz (81 kg)   SpO2 98%   BMI 29.72 kg/m   Wt Readings from Last 3 Encounters:  09/24/17 178 lb 9.6 oz (81 kg)  (94 %, Z= 1.57)*  08/25/17 182 lb 3.2 oz (82.6 kg) (95 %, Z= 1.64)*  07/15/17 187 lb (84.8 kg) (96 %, Z= 1.74)*   * Growth percentiles are based on CDC (Girls, 2-20 Years) data.    Physical Exam General appearance - alert, well appearing, and in no distress Mental status - alert, oriented to person, place, and time, normal mood, behavior, speech, dress, motor activity, and thought processes Neck - supple, no significant adenopathy Chest - clear to auscultation, no wheezes, rales or rhonchi, symmetric air entry Heart -slightly brady but regular  ASSESSMENT AND PLAN: 1. Bradycardia -EKG reviewed from ER visit.  Patient is asymptomatic.  Will monitor.  2. Hypercalcemia -Most likely hyperparathyroidism.  Discussed diagnosis with patient and need to check parathyroid hormone level.   - Parathyroid hormone, intact (no Ca) - TSH - Comprehensive metabolic panel - Magnesium  3. Diabetes mellitus screening - Hemoglobin A1c  Patient was given the opportunity to ask questions.  Patient verbalized understanding of the plan and was able to repeat key elements of the plan.   Orders Placed This Encounter  Procedures  . Parathyroid hormone, intact (no Ca)  . TSH  . Comprehensive metabolic panel  . Magnesium  . Hemoglobin A1c     Requested Prescriptions    No prescriptions requested or ordered in this encounter    Return if symptoms worsen or fail to improve.  Jonah Blue, MD, FACP

## 2017-09-25 LAB — COMPREHENSIVE METABOLIC PANEL
A/G RATIO: 1.6 (ref 1.2–2.2)
ALBUMIN: 4.4 g/dL (ref 3.5–5.5)
ALK PHOS: 64 IU/L (ref 39–117)
ALT: 12 IU/L (ref 0–32)
AST: 13 IU/L (ref 0–40)
BUN / CREAT RATIO: 12 (ref 9–23)
BUN: 8 mg/dL (ref 6–20)
Bilirubin Total: 0.4 mg/dL (ref 0.0–1.2)
CO2: 21 mmol/L (ref 20–29)
CREATININE: 0.65 mg/dL (ref 0.57–1.00)
Calcium: 10.9 mg/dL — ABNORMAL HIGH (ref 8.7–10.2)
Chloride: 107 mmol/L — ABNORMAL HIGH (ref 96–106)
GFR calc Af Amer: 149 mL/min/{1.73_m2} (ref >59)
GFR calc non Af Amer: 129 mL/min/{1.73_m2} (ref >59)
GLOBULIN, TOTAL: 2.8 g/dL (ref 1.5–4.5)
Glucose: 91 mg/dL (ref 65–99)
Potassium: 4.1 mmol/L (ref 3.5–5.2)
SODIUM: 141 mmol/L (ref 134–144)
Total Protein: 7.2 g/dL (ref 6.0–8.5)

## 2017-09-25 LAB — MAGNESIUM: MAGNESIUM: 2.2 mg/dL (ref 1.6–2.3)

## 2017-09-25 LAB — HEMOGLOBIN A1C
Est. average glucose Bld gHb Est-mCnc: 103 mg/dL
Hgb A1c MFr Bld: 5.2 % (ref 4.8–5.6)

## 2017-09-25 LAB — TSH: TSH: 1.06 u[IU]/mL (ref 0.450–4.500)

## 2017-09-25 LAB — PARATHYROID HORMONE, INTACT (NO CA): PTH: 27 pg/mL (ref 15–65)

## 2017-09-27 ENCOUNTER — Telehealth: Payer: Self-pay | Admitting: Internal Medicine

## 2017-09-27 ENCOUNTER — Other Ambulatory Visit: Payer: Self-pay | Admitting: Internal Medicine

## 2017-09-27 NOTE — Telephone Encounter (Signed)
Patient came in and said yes to the 24hr urine

## 2017-10-05 ENCOUNTER — Other Ambulatory Visit: Payer: Self-pay | Admitting: Internal Medicine

## 2017-10-05 NOTE — Addendum Note (Signed)
Addended by: Paschal Dopp on: 10/05/2017 11:55 AM   Modules accepted: Orders

## 2017-10-06 LAB — CALCIUM, URINE, 24 HOUR: Calcium, Urine: 5.9 mg/dL

## 2017-10-06 LAB — CREATININE, URINE, 24 HOUR: Creatinine, Urine: 204 mg/dL

## 2017-10-14 ENCOUNTER — Encounter: Payer: Self-pay | Admitting: Internal Medicine

## 2017-11-01 ENCOUNTER — Emergency Department (HOSPITAL_COMMUNITY)
Admission: EM | Admit: 2017-11-01 | Discharge: 2017-11-02 | Disposition: A | Discharge status: Home / Self Care | Payer: Medicaid Other | Attending: Emergency Medicine | Admitting: Emergency Medicine

## 2017-11-01 ENCOUNTER — Encounter (HOSPITAL_COMMUNITY): Payer: Self-pay

## 2017-11-01 ENCOUNTER — Emergency Department (HOSPITAL_COMMUNITY): Payer: Medicaid Other

## 2017-11-01 DIAGNOSIS — R072 Precordial pain: Secondary | ICD-10-CM

## 2017-11-01 DIAGNOSIS — R001 Bradycardia, unspecified: Secondary | ICD-10-CM | POA: Insufficient documentation

## 2017-11-01 DIAGNOSIS — F17290 Nicotine dependence, other tobacco product, uncomplicated: Secondary | ICD-10-CM | POA: Insufficient documentation

## 2017-11-01 DIAGNOSIS — J45909 Unspecified asthma, uncomplicated: Secondary | ICD-10-CM | POA: Insufficient documentation

## 2017-11-01 LAB — CBC
HEMATOCRIT: 39.3 % (ref 36.0–46.0)
Hemoglobin: 12.8 g/dL (ref 12.0–15.0)
MCH: 30.9 pg (ref 26.0–34.0)
MCHC: 32.6 g/dL (ref 30.0–36.0)
MCV: 94.9 fL (ref 78.0–100.0)
PLATELETS: 243 10*3/uL (ref 150–400)
RBC: 4.14 MIL/uL (ref 3.87–5.11)
RDW: 13.1 % (ref 11.5–15.5)
WBC: 5.2 10*3/uL (ref 4.0–10.5)

## 2017-11-01 LAB — BASIC METABOLIC PANEL
Anion gap: 8 (ref 5–15)
BUN: 5 mg/dL — ABNORMAL LOW (ref 6–20)
CALCIUM: 10.7 mg/dL — AB (ref 8.9–10.3)
CO2: 24 mmol/L (ref 22–32)
Chloride: 111 mmol/L (ref 101–111)
Creatinine, Ser: 0.75 mg/dL (ref 0.44–1.00)
GFR calc Af Amer: >60 mL/min (ref >60)
Glucose, Bld: 87 mg/dL (ref 65–99)
POTASSIUM: 3.7 mmol/L (ref 3.5–5.1)
SODIUM: 143 mmol/L (ref 135–145)

## 2017-11-01 LAB — I-STAT BETA HCG BLOOD, ED (MC, WL, AP ONLY)

## 2017-11-01 LAB — I-STAT TROPONIN, ED: TROPONIN I, POC: 0 ng/mL (ref 0.00–0.08)

## 2017-11-01 NOTE — ED Triage Notes (Signed)
Pt states that for the past month she has been having CP everyday, central, with some SOB and no other associating cardiac symptoms

## 2017-11-02 LAB — D-DIMER, QUANTITATIVE: D-Dimer, Quant: 0.37 ug/mL-FEU (ref 0.00–0.50)

## 2017-11-02 MED ORDER — SODIUM CHLORIDE 0.9 % IV BOLUS: 1000.0000 mL | Freq: Once | INTRAVENOUS | Status: DC

## 2017-11-02 NOTE — ED Provider Notes (Signed)
[facility] EMERGENCY [facility]PARTMENT Provider Note   CSN: [id number] Arrival date & time: 11/01/17  2113     History   Chief Complaint Chief Complaint  Patient presents with  . Chest Pain    HPI Jenna Henry is a 22 y.o. female.  Patient with no pertinent past medical history presents to the emergency department with a chief complaint of chest pain.  She states that she has been having chest pain for about the past month.  She states that it comes and goes randomly.  She also reports associated shortness of breath.  She states that she has been traveling frequently, and [place] to [place] and back and forth.  She denies any lower extremity swelling, or calf pain.  Denies any history of PE or DVT.  She does report a history of asthma, but states that she has not had any recent trouble with this.  She denies any fever, chills, cough.  She has not taken anything for her symptoms.  She denies any other associated symptoms.  The history is provided by the patient. No language interpreter was used.    Past Medical History:  Diagnosis Date  . Anxiety   . Asthma   . Depression   . Herpes   . Polycystic ovaries     Patient Active Problem List   Diagnosis Date Noted  . Severe major depression without psychotic features (HCC) 02/28/2017  . 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 Suicide attempt (HCC) 02/28/2017  . Anxiety state 02/28/2017  . Insomnia 02/28/2017  . Herpes 11/12/2016  . Irregular menstruation 07/06/2016    Past Surgical History:  Procedure Laterality Date  . CYSTECTOMY       OB History   None      Home Medications    Prior to Admission medications   Medication Sig Start Date End Date Taking? Authorizing Provider  ibuprofen (ADVIL,MOTRIN) 600 MG tablet Take 1 tablet (600 mg total) by mouth every 6 (six) hours as needed. Patient not taking: Reported on 09/24/2017 09/10/17    Fayrene Helperran, [place], PA-C    Family History Family History  Problem Relation Age of Onset  . Hypertension Mother     Social History Social History   Tobacco Use  . Smoking status: Current Some Day Smoker    Packs/day: 1.00  . Smokeless tobacco: Never Used  . Tobacco comment: pt reports smokeing 1 Black and mild daily  Substance Use Topics  . Alcohol use: No  . Drug use: Yes    Types: Marijuana     Allergies   Ortho tri-cyclen [norgestimate-eth estradiol]   Review of Systems Review of Systems  All other systems reviewed and are negative.    Physical Exam Updated Vital Signs BP 103/72   Pulse 66   Temp 98.3 F (36.8 C) (Oral)   Resp 16   SpO2 96%   Physical Exam  Constitutional: She is oriented to person, place, and time. She appears well-developed and well-nourished.  HENT:  Head: Normocephalic and atraumatic.  Eyes: Pupils are equal, round, and reactive to light. Conjunctivae and EOM are normal.  Neck: Normal range of motion. Neck supple.  Cardiovascular: Regular rhythm. Bradycardia present. Exam reveals no gallop and no friction rub.  No murmur heard. Pulmonary/Chest: Effort normal and breath sounds normal. No respiratory distress. She has no wheezes. She has no rales. She exhibits no tenderness.  Abdominal: Soft. Bowel sounds are normal. She exhibits  no distension and no mass. There is no tenderness. There is no rebound and no guarding.  Musculoskeletal: Normal range of motion. She exhibits no edema or tenderness.  Neurological: She is alert and oriented to person, place, and time.  Skin: Skin is warm and dry.  Psychiatric: She has a normal mood and affect. Her behavior is normal. Judgment and thought content normal.  Nursing note and vitals reviewed.    ED Treatments / Results  Labs (all labs ordered are listed, but only abnormal results are displayed) Labs Reviewed  BASIC METABOLIC PANEL - Abnormal; Notable for the following components:      Result Value     BUN 5 (*)    Calcium 10.7 (*)    All other components within normal limits  CBC  D-DIMER, QUANTITATIVE (NOT AT [facility])  I-STAT TROPONIN, ED  I-STAT BETA HCG BLOOD, ED ([facility], [facility], [facility] ONLY)    EKG EKG Interpretation  Date/Time:  Monday November 01 2017 21:26:12 EDT Ventricular Rate:  55 PR Interval:  180 QRS Duration: 78 QT Interval:  388 QTC Calculation: 371 R Axis:   53 Text Interpretation:  Sinus bradycardia with marked sinus arrhythmia Otherwise normal ECG Confirmed by Ross Marcus ([id number]) on 11/02/2017 1:08:58 AM   Radiology Dg Chest 2 View  Result Date: 11/01/2017 CLINICAL DATA:  Chronic central chest pain and shortness of breath. EXAM: CHEST - 2 VIEW COMPARISON:  Chest radiograph 02/20/2017 FINDINGS: The lungs are well-aerated. Minimal right basilar airspace opacity may reflect atelectasis or possibly mild pneumonia. There is no evidence of pleural effusion or pneumothorax. The heart is normal in size; the mediastinal contour is within normal limits. No acute osseous abnormalities are seen. IMPRESSION: Minimal right basilar airspace opacity may reflect atelectasis or possibly mild pneumonia. Electronically Signed   By: Roanna Raider M.D.   On: 11/01/2017 22:17    Procedures Procedures (including critical care time)  Medications Ordered in ED Medications  sodium chloride 0.9 % bolus 1,000 mL (has no administration in time range)     Initial Impression / Assessment and Plan / ED Course  I have reviewed the triage vital signs and the nursing notes.  Pertinent labs & imaging results that were available during my care of the patient were reviewed by me and considered in my medical decision making (see chart for details).     Patient with intermittent episodes of chest pain shortness of breath.  She does travel frequently, but does not have any calf pain or lower extremity swelling.  Will check d-dimer to rule out PE if negative.  She is not hypoxic nor tachycardic.  In fact,  she is bradycardic in the 40s and 50s.  Blood pressure has been slightly low in the 100s.  She denies any lightheadedness or dizziness.  D-dimer is negative.  Patient is not having symptoms now.  Laboratory work-up, EKG, and x-ray are reassuring.  I did discuss case with Dr. Wilkie Aye, who agrees with close follow-up.  Final Clinical Impressions(s) / ED Diagnoses   Final diagnoses:  Precordial pain  Bradycardia    ED Discharge Orders    None       Roxy Horseman, PA-C 11/02/17 6045    Shon Baton, MD 11/02/17 [phone]

## 2017-11-02 NOTE — ED Notes (Signed)
Pt refused to get IV line started.

## 2017-11-03 ENCOUNTER — Emergency Department (HOSPITAL_COMMUNITY)
Admission: EM | Admit: 2017-11-03 | Discharge: 2017-11-03 | Disposition: A | Discharge status: Home / Self Care | Payer: Self-pay | Attending: Emergency Medicine | Admitting: Emergency Medicine

## 2017-11-03 ENCOUNTER — Encounter (HOSPITAL_COMMUNITY): Payer: Self-pay | Admitting: Emergency Medicine

## 2017-11-03 ENCOUNTER — Emergency Department (HOSPITAL_COMMUNITY): Payer: Self-pay

## 2017-11-03 DIAGNOSIS — R002 Palpitations: Secondary | ICD-10-CM | POA: Insufficient documentation

## 2017-11-03 DIAGNOSIS — R079 Chest pain, unspecified: Secondary | ICD-10-CM | POA: Insufficient documentation

## 2017-11-03 DIAGNOSIS — J45909 Unspecified asthma, uncomplicated: Secondary | ICD-10-CM | POA: Insufficient documentation

## 2017-11-03 DIAGNOSIS — Z79899 Other long term (current) drug therapy: Secondary | ICD-10-CM | POA: Insufficient documentation

## 2017-11-03 DIAGNOSIS — Z720 Tobacco use: Secondary | ICD-10-CM | POA: Insufficient documentation

## 2017-11-03 LAB — I-STAT BETA HCG BLOOD, ED (MC, WL, AP ONLY)

## 2017-11-03 LAB — BASIC METABOLIC PANEL
Anion gap: 7 (ref 5–15)
BUN: 8 mg/dL (ref 6–20)
CHLORIDE: 111 mmol/L (ref 101–111)
CO2: 24 mmol/L (ref 22–32)
CREATININE: 0.82 mg/dL (ref 0.44–1.00)
Calcium: 10.6 mg/dL — ABNORMAL HIGH (ref 8.9–10.3)
GFR calc Af Amer: >60 mL/min (ref >60)
GFR calc non Af Amer: >60 mL/min (ref >60)
GLUCOSE: 95 mg/dL (ref 65–99)
POTASSIUM: 3.6 mmol/L (ref 3.5–5.1)
SODIUM: 142 mmol/L (ref 135–145)

## 2017-11-03 LAB — CBC
HEMATOCRIT: 38.1 % (ref 36.0–46.0)
Hemoglobin: 12.8 g/dL (ref 12.0–15.0)
MCH: 31.2 pg (ref 26.0–34.0)
MCHC: 33.6 g/dL (ref 30.0–36.0)
MCV: 92.9 fL (ref 78.0–100.0)
PLATELETS: 221 10*3/uL (ref 150–400)
RBC: 4.1 MIL/uL (ref 3.87–5.11)
RDW: 12.8 % (ref 11.5–15.5)
WBC: 5.5 10*3/uL (ref 4.0–10.5)

## 2017-11-03 LAB — I-STAT TROPONIN, ED: Troponin i, poc: 0 ng/mL (ref 0.00–0.08)

## 2017-11-03 NOTE — Discharge Instructions (Addendum)
Call the cardiology office in the morning to arrange a follow-up appointment.  [facility] has been provided in this discharge summary for you to call and make these arrangements.

## 2017-11-03 NOTE — ED Triage Notes (Signed)
Pt arrives via EMS from home, began to have chest pain after getting in an argument with her boyfriend. Reports centralized CP with some shob. Reports hx sinus bradycardia/arrythmia, anxiety.  Pt seen earlier this week for same.

## 2017-11-03 NOTE — ED Provider Notes (Signed)
[facility] EMERGENCY DEPARTMENT Provider Note   CSN: [id number] Arrival date & time: 11/03/17  0100     History   Chief Complaint Chief Complaint  Patient presents with  . Chest Pain    HPI Jenna Henry is a 22 y.o. female.  Patient is a 22 year old female with past medical history of anxiety, asthma, and polycystic ovaries.  She presents today for evaluation of low heart rate.  She has been having episodes of chest discomfort for the past several weeks.  She was seen yesterday evening with similar complaints.  This evening she was having a discussion with her boyfriend when she became lightheaded, then leaned on the wall and lowered herself to the ground.  She reports being "in and out of consciousness" for several seconds, then symptoms resolved.  EMS told her that she had an irregular, slow heartbeat which she tells me is new for her.  She denies difficulty breathing.  She does report being under a significant quantity of stress recently.  The history is provided by the patient.  Chest Pain   This is a new problem. The current episode started 1 to 2 hours ago. The problem occurs constantly. The problem has been resolved. The pain is present in the substernal region. The pain is moderate. The quality of the pain is described as pressure-like. The pain does not radiate.    Past Medical History:  Diagnosis Date  . Anxiety   . Asthma   . Depression   . Herpes   . Polycystic ovaries     Patient Active Problem List   Diagnosis Date Noted  . Severe major depression without psychotic features (HCC) 02/28/2017  . 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 Suicide attempt (HCC) 02/28/2017  . Anxiety state 02/28/2017  . Insomnia 02/28/2017  . Herpes 11/12/2016  . Irregular menstruation 07/06/2016    Past Surgical History:  Procedure Laterality Date  . CYSTECTOMY       OB History   None      Home Medications     Prior to Admission medications   Medication Sig Start Date End Date Taking? Authorizing Provider  ibuprofen (ADVIL,MOTRIN) 600 MG tablet Take 1 tablet (600 mg total) by mouth every 6 (six) hours as needed. Patient not taking: Reported on 09/24/2017 09/10/17   Fayrene Helper, PA-C    Family History Family History  Problem Relation Age of Onset  . Hypertension Mother     Social History Social History   Tobacco Use  . Smoking status: Current Some Day Smoker    Packs/day: 1.00  . Smokeless tobacco: Never Used  . Tobacco comment: pt reports smokeing 1 Black and mild daily  Substance Use Topics  . Alcohol use: No  . Drug use: Yes    Types: Marijuana     Allergies   Ortho tri-cyclen [norgestimate-eth estradiol]   Review of Systems Review of Systems  Cardiovascular: Positive for chest pain.  All other systems reviewed and are negative.    Physical Exam Updated Vital Signs BP 102/79 (BP Location: Right Arm)   Pulse (!) 46   Temp 97.7 F (36.5 C) (Oral)   Resp 16   Ht 5\' 5"  (1.651 m)   Wt 79.8 kg (176 lb)   LMP 10/30/2017 (Approximate)   SpO2 100%   BMI 29.29 kg/m   Physical Exam  Constitutional: She is oriented to person, place, and time. She appears well-developed and well-nourished. No distress.  HENT:  Head: Normocephalic and atraumatic.  Neck: Normal range of motion. Neck supple.  Cardiovascular: Regular rhythm. Bradycardia present. Exam reveals no gallop and no friction rub.  No murmur heard. Pulmonary/Chest: Effort normal and breath sounds normal. No respiratory distress. She has no wheezes.  Abdominal: Soft. Bowel sounds are normal. She exhibits no distension. There is no tenderness.  Musculoskeletal: Normal range of motion.       Right lower leg: Normal. She exhibits no tenderness and no edema.       Left lower leg: Normal. She exhibits no tenderness and no edema.  Neurological: She is alert and oriented to person, place, and time.  Skin: Skin is warm  and dry. She is not diaphoretic.  Nursing note and vitals reviewed.    ED Treatments / Results  Labs (all labs ordered are listed, but only abnormal results are displayed) Labs Reviewed  BASIC METABOLIC PANEL - Abnormal; Notable for the following components:      Result Value   Calcium 10.6 (*)    All other components within normal limits  CBC  I-STAT TROPONIN, ED  I-STAT BETA HCG BLOOD, ED ([facility], [facility], [facility] ONLY)    EKG EKG Interpretation  Date/Time:  Wednesday November 03 2017 01:08:37 EDT Ventricular Rate:  51 PR Interval:    QRS Duration: 88 QT Interval:  408 QTC Calculation: 376 R Axis:   53 Text Interpretation:  Sinus arrhythmia Confirmed by Geoffery LyonseLo, Makyle Eslick ([id number]) on 11/03/2017 1:14:01 AM   Radiology Dg Chest 2 View  Result Date: 11/03/2017 CLINICAL DATA:  Chest pain EXAM: CHEST - 2 VIEW COMPARISON:  11/01/2017 FINDINGS: The heart size and mediastinal contours are within normal limits. Both lungs are clear. The visualized skeletal structures are unremarkable. IMPRESSION: Clear lungs. Electronically Signed   By: Deatra RobinsonKevin  Herman M.D.   On: 11/03/2017 01:39   Dg Chest 2 View  Result Date: 11/01/2017 CLINICAL DATA:  Chronic central chest pain and shortness of breath. EXAM: CHEST - 2 VIEW COMPARISON:  Chest radiograph 02/20/2017 FINDINGS: The lungs are well-aerated. Minimal right basilar airspace opacity may reflect atelectasis or possibly mild pneumonia. There is no evidence of pleural effusion or pneumothorax. The heart is normal in size; the mediastinal contour is within normal limits. No acute osseous abnormalities are seen. IMPRESSION: Minimal right basilar airspace opacity may reflect atelectasis or possibly mild pneumonia. Electronically Signed   By: Roanna RaiderJeffery  Chang M.D.   On: 11/01/2017 22:17    Procedures Procedures (including critical care time)  Medications Ordered in ED Medications - No data to display   Initial Impression / Assessment and Plan / ED Course  I have reviewed  the triage vital signs and the nursing notes.  Pertinent labs & imaging results that were available during my care of the patient were reviewed by me and considered in my medical decision making (see chart for details).  Patient's work-up this evening is unremarkable.  There are no significant lab abnormalities and EKG reveals a sinus bradycardia.  She has been monitored while in the ED.  There have been episodes where her heart rate has dropped into the mid 40s.  Whether this is pathologic or her norm, I am uncertain.  She does appear very comfortable and hemodynamically stable.  Recommendations were made for her to follow-up with cardiology and I will advised her to follow-up as well.  She may well benefit from a Holter monitor and echocardiogram.  She is to call in the morning to arrange this appointment.  Final Clinical Impressions(s) / ED Diagnoses   Final diagnoses:  None    ED Discharge Orders    None       Geoffery Lyons, MD 11/03/17 [phone]

## 2017-11-09 ENCOUNTER — Encounter: Payer: Self-pay | Admitting: Cardiology

## 2017-11-09 ENCOUNTER — Ambulatory Visit (INDEPENDENT_AMBULATORY_CARE_PROVIDER_SITE_OTHER): Payer: Self-pay | Admitting: Cardiology

## 2017-11-09 VITALS — BP 126/82 | HR 75 | Ht 65.0 in | Wt 168.0 lb

## 2017-11-09 DIAGNOSIS — F17210 Nicotine dependence, cigarettes, uncomplicated: Secondary | ICD-10-CM | POA: Insufficient documentation

## 2017-11-09 DIAGNOSIS — R079 Chest pain, unspecified: Secondary | ICD-10-CM

## 2017-11-09 DIAGNOSIS — R002 Palpitations: Secondary | ICD-10-CM | POA: Insufficient documentation

## 2017-11-09 DIAGNOSIS — R0789 Other chest pain: Secondary | ICD-10-CM

## 2017-11-09 HISTORY — DX: Palpitations: R00.2

## 2017-11-09 HISTORY — DX: Chest pain, unspecified: R07.9

## 2017-11-09 NOTE — Progress Notes (Signed)
Cardiology Office Note:    Date:  11/09/2017   ID:  Jenna Henry, DOB 11/22/1995, MRN [id number]  PCP:  Marcine MatarJohnson, Deborah B, MD  Cardiologist:  Garwin Brothersajan R Chlora Mcbain, MD   Referring MD: Marcine MatarJohnson, Deborah B, MD    ASSESSMENT:    No diagnosis found. PLAN:    In order of problems listed above:  1. Primary prevention stressed with the patient.  Importance of compliance with diet and medications stressed and she vocalized understanding.  Blood pressure is stable. 2. In view of her symptoms and her being told that she has bradycardia we will do a one-month event monitor.  Her chest pain needs to be assessed and will do a treadmill stress test.  Echocardiogram will be done to assess murmur heard on auscultation.  She will also get a urine toxicology screen.  I counseled her against smoking and she plans to quit.  She will be seen in follow-up appointment in 2 months or earlier if she has any concerns.  She knows to go to the nearest emergency room for any concerning problems.   Medication Adjustments/Labs and Tests Ordered: Current medicines are reviewed at length with the patient today.  Concerns regarding medicines are outlined above.  No orders of the defined types were placed in this encounter.  No orders of the defined types were placed in this encounter.    History of Present Illness:    Jenna Henry is a 22 y.o. female who is being seen today for the evaluation of palpitations, slow heartbeat and chest pain  at the request of Marcine MatarJohnson, Deborah B, MD.  Patient is a pleasant 22 year old female.  She is accompanied by her boyfriend.  She mentions to me that she went to the emergency room because she had lightheadedness and almost passed out.  No orthopnea or PND.  She occasionally has a flushed feeling and chest tightness.  This is happened to her in the past 1 month and her passing out spell was only once.  She also mentions to me that her chest tightness has come on and off in the past 1 month.   No orthopnea or PND.  There is no radiation to the any part of the body.  At the time of my evaluation, the patient is alert awake oriented and in no distress.  Past Medical History:  Diagnosis Date  . Anxiety   . Asthma   . Depression   . Herpes   . Polycystic ovaries     Past Surgical History:  Procedure Laterality Date  . CYSTECTOMY      Current Medications: No outpatient medications have been marked as taking for the 11/09/17 encounter (Office Visit) with Manya Balash, Aundra Dubinajan R, MD.     Allergies:   Ortho tri-cyclen [norgestimate-eth estradiol]   Social History   Socioeconomic History  . Marital status: Single    Spouse name: Not on file  . Number of children: Not on file  . Years of education: Not on file  . Highest education level: Not on file  Occupational History  . Not on file  Social Needs  . Financial resource strain: Not on file  . Food insecurity:    Worry: Not on file    Inability: Not on file  . Transportation needs:    Medical: Not on file    Non-medical: Not on file  Tobacco Use  . Smoking status: Current Some Day Smoker    Packs/day: 1.00  . Smokeless tobacco: Never Used  .  Tobacco comment: pt reports smokeing 1 Black and mild daily  Substance and Sexual Activity  . Alcohol use: No  . Drug use: Yes    Types: Marijuana  . Sexual activity: Yes  Lifestyle  . Physical activity:    Days per week: Not on file    Minutes per session: Not on file  . Stress: Not on file  Relationships  . Social connections:    Talks on phone: Not on file    Gets together: Not on file    Attends religious service: Not on file    Active member of club or organization: Not on file    Attends meetings of clubs or organizations: Not on file    Relationship status: Not on file  Other Topics Concern  . Not on file  Social History Narrative  . Not on file     Family History: The patient's family history includes Cancer in her paternal grandmother; Diabetes in her  paternal grandmother; Heart disease in her maternal grandfather; Heart murmur in her father; Hypertension in her mother.  ROS:   Please see the history of present illness.    All other systems reviewed and are negative.  EKGs/Labs/Other Studies Reviewed:    The following studies were reviewed today: I discussed my findings with the patient at length.  Emergency room records were reviewed.   Recent Labs: 09/24/2017: ALT 12; Magnesium 2.2; TSH 1.060 11/03/2017: BUN 8; Creatinine, Ser 0.82; Hemoglobin 12.8; Platelets 221; Potassium 3.6; Sodium 142  Recent Lipid Panel    Component Value Date/Time   CHOL 147 03/01/2017 0631   TRIG 118 03/01/2017 0631   HDL 35 (L) 03/01/2017 0631   CHOLHDL 4.2 03/01/2017 0631   VLDL 24 03/01/2017 0631   LDLCALC 88 03/01/2017 0631    Physical Exam:    VS:  BP 126/82 (BP Location: Left Arm, Patient Position: Sitting, Cuff Size: Normal)   Pulse 75   Ht 5\' 5"  (1.651 m)   Wt 168 lb (76.2 kg)   LMP 10/30/2017 (Approximate)   SpO2 98%   BMI 27.96 kg/m     Wt Readings from Last 3 Encounters:  11/09/17 168 lb (76.2 kg) (91 %, Z= 1.33)*  11/03/17 176 lb (79.8 kg) (93 %, Z= 1.51)*  09/24/17 178 lb 9.6 oz (81 kg) (94 %, Z= 1.57)*   * Growth percentiles are based on CDC (Girls, 2-20 Years) data.     GEN: Patient is in no acute distress HEENT: Normal NECK: No JVD; No carotid bruits LYMPHATICS: No lymphadenopathy CARDIAC: S1 S2 regular, 2/6 systolic murmur at the apex. RESPIRATORY:  Clear to auscultation without rales, wheezing or rhonchi  ABDOMEN: Soft, non-tender, non-distended MUSCULOSKELETAL:  No edema; No deformity  SKIN: Warm and dry NEUROLOGIC:  Alert and oriented x 3 PSYCHIATRIC:  Normal affect    Signed, Garwin Brothers, MD  11/09/2017 2:04 PM    [facility]

## 2017-11-09 NOTE — Patient Instructions (Signed)
Medication Instructions:  Your physician recommends that you continue on your current medications as directed. Please refer to the Current Medication list given to you today.  Labwork: Your physician recommends that you have the following labs drawn: Urine testing  Testing/Procedures: Your physician has requested that you have an echocardiogram. Echocardiography is a painless test that uses sound waves to create images of your heart. It provides your doctor with information about the size and shape of your heart and how well your heart's chambers and valves are working. This procedure takes approximately one hour. There are no restrictions for this procedure.  Your physician has recommended that you wear an event monitor. Event monitors are medical devices that record the heart's electrical activity. Doctors most often us these monitors to diagnose arrhythmias. Arrhythmias are problems with the speed or rhythm of the heartbeat. The monitor is a small, portable device. You can wear one while you do your normal daily activities. This is usually used to diagnose what is causing palpitations/syncope (passing out).  Your physician has requested that you have an exercise tolerance test. For further information please visit [email or web address]. Please also follow instruction sheet, as given.  Follow-Up: Your physician recommends that you schedule a follow-up appointment in: 2 months  Any Other Special Instructions Will Be Listed Below (If Applicable).     If you need a refill on your cardiac medications before your next appointment, please call your pharmacy.   [facility]  Garey HamAshley A, RN, BSN   Holter Monitoring A Holter monitor is a small device that is used to detect abnormal heart rhythms. It clips to your clothing and is connected by wires to flat, sticky disks (electrodes) that attach to your chest. It is worn continuously for 24-48 hours. Follow these instructions at home:  Wear  your Holter monitor at all times, even while exercising and sleeping, for as long as directed by your health care provider.  Make sure that the Holter monitor is safely clipped to your clothing or close to your body as recommended by your health care provider.  Do not get the monitor or wires wet.  Do not put body lotion or moisturizer on your chest.  Keep your skin clean.  Keep a diary of your daily activities, such as walking and doing chores. If you feel that your heartbeat is abnormal or that your heart is fluttering or skipping a beat: ? Record what you are doing when it happens. ? Record what time of day the symptoms occur.  Return your Holter monitor as directed by your health care provider.  Keep all follow-up visits as directed by your health care provider. This is important. Get help right away if:  You feel lightheaded or you faint.  You have trouble breathing.  You feel pain in your chest, upper arm, or jaw.  You feel sick to your stomach and your skin is pale, cool, or damp.  You heartbeat feels unusual or abnormal. This information is not intended to replace advice given to you by your health care provider. Make sure you discuss any questions you have with your health care provider. Document Released: 02/14/2004 Document Revised: 10/24/2015 Document Reviewed: 12/25/2013 Elsevier Interactive Patient Education  2018 [employer] Echocardiogram An echocardiogram, or echocardiography, uses sound waves (ultrasound) to produce an image of your heart. The echocardiogram is simple, painless, obtained within a short period of time, and offers valuable information to your health care provider. The images from an echocardiogram can provide information such  as:  Evidence of coronary artery disease (CAD).  Heart size.  Heart muscle function.  Heart valve function.  Aneurysm detection.  Evidence of a past heart attack.  Fluid buildup around the heart.  Heart muscle  thickening.  Assess heart valve function.  Tell a health care provider about:  Any allergies you have.  All medicines you are taking, including vitamins, herbs, eye drops, creams, and over-the-counter medicines.  Any problems you or family members have had with anesthetic medicines.  Any blood disorders you have.  Any surgeries you have had.  Any medical conditions you have.  Whether you are pregnant or may be pregnant. What happens before the procedure? No special preparation is needed. Eat and drink normally. What happens during the procedure?  In order to produce an image of your heart, gel will be applied to your chest and a wand-like tool (transducer) will be moved over your chest. The gel will help transmit the sound waves from the transducer. The sound waves will harmlessly bounce off your heart to allow the heart images to be captured in real-time motion. These images will then be recorded.  You may need an IV to receive a medicine that improves the quality of the pictures. What happens after the procedure? You may return to your normal schedule including diet, activities, and medicines, unless your health care provider tells you otherwise. This information is not intended to replace advice given to you by your health care provider. Make sure you discuss any questions you have with your health care provider. Document Released: 05/15/2000 Document Revised: 01/04/2016 Document Reviewed: 01/23/2013 Elsevier Interactive Patient Education  2017 [employer].

## 2017-11-10 LAB — DRUG SCREEN, URINE
Amphetamines, Urine: NEGATIVE ng/mL
Barbiturate screen, urine: NEGATIVE ng/mL
Benzodiazepine Quant, Ur: NEGATIVE ng/mL
Cannabinoid Quant, Ur: POSITIVE ng/mL — AB
Cocaine (Metab.): NEGATIVE ng/mL
Opiate Quant, Ur: NEGATIVE ng/mL
PCP Quant, Ur: NEGATIVE ng/mL

## 2017-11-12 ENCOUNTER — Ambulatory Visit: Payer: Self-pay | Admitting: Neurology

## 2017-11-18 ENCOUNTER — Other Ambulatory Visit (HOSPITAL_COMMUNITY): Payer: Self-pay

## 2017-11-18 ENCOUNTER — Ambulatory Visit (HOSPITAL_COMMUNITY): Payer: Self-pay | Attending: Cardiology

## 2017-11-18 ENCOUNTER — Ambulatory Visit (INDEPENDENT_AMBULATORY_CARE_PROVIDER_SITE_OTHER): Payer: Self-pay

## 2017-11-18 ENCOUNTER — Other Ambulatory Visit: Payer: Self-pay

## 2017-11-18 ENCOUNTER — Ambulatory Visit: Payer: Self-pay

## 2017-11-18 DIAGNOSIS — R011 Cardiac murmur, unspecified: Secondary | ICD-10-CM | POA: Insufficient documentation

## 2017-11-18 DIAGNOSIS — Z720 Tobacco use: Secondary | ICD-10-CM | POA: Insufficient documentation

## 2017-11-18 DIAGNOSIS — R002 Palpitations: Secondary | ICD-10-CM | POA: Insufficient documentation

## 2017-11-18 DIAGNOSIS — R001 Bradycardia, unspecified: Secondary | ICD-10-CM | POA: Insufficient documentation

## 2017-11-18 DIAGNOSIS — J45909 Unspecified asthma, uncomplicated: Secondary | ICD-10-CM | POA: Insufficient documentation

## 2017-11-18 DIAGNOSIS — F419 Anxiety disorder, unspecified: Secondary | ICD-10-CM | POA: Insufficient documentation

## 2017-11-18 LAB — EXERCISE TOLERANCE TEST
CHL CUP MPHR: 201 {beats}/min
CHL CUP RESTING HR STRESS: 60 {beats}/min
CSEPEDS: 33 s
CSEPPHR: 151 {beats}/min
Estimated workload: 10.1 METS
Exercise duration (min): 8 min
Percent HR: 75 %
RPE: 17

## 2017-11-21 ENCOUNTER — Encounter: Payer: Self-pay | Admitting: Internal Medicine

## 2017-11-26 ENCOUNTER — Ambulatory Visit (HOSPITAL_COMMUNITY)
Admission: EM | Admit: 2017-11-26 | Discharge: 2017-11-26 | Disposition: A | Discharge status: Home / Self Care | Payer: Medicaid Other | Attending: Family Medicine | Admitting: Family Medicine

## 2017-11-26 ENCOUNTER — Telehealth: Payer: Self-pay | Admitting: Internal Medicine

## 2017-11-26 ENCOUNTER — Encounter (HOSPITAL_COMMUNITY): Payer: Self-pay | Admitting: Emergency Medicine

## 2017-11-26 DIAGNOSIS — R1033 Periumbilical pain: Secondary | ICD-10-CM

## 2017-11-26 DIAGNOSIS — Z3202 Encounter for pregnancy test, result negative: Secondary | ICD-10-CM

## 2017-11-26 DIAGNOSIS — E282 Polycystic ovarian syndrome: Secondary | ICD-10-CM

## 2017-11-26 DIAGNOSIS — R112 Nausea with vomiting, unspecified: Secondary | ICD-10-CM

## 2017-11-26 LAB — POCT URINALYSIS DIP (DEVICE)
BILIRUBIN URINE: NEGATIVE
GLUCOSE, UA: NEGATIVE mg/dL
Hgb urine dipstick: NEGATIVE
KETONES UR: NEGATIVE mg/dL
LEUKOCYTES UA: NEGATIVE
Nitrite: NEGATIVE
Protein, ur: NEGATIVE mg/dL
SPECIFIC GRAVITY, URINE: 1.02 (ref 1.005–1.030)
UROBILINOGEN UA: 0.2 mg/dL (ref 0.0–1.0)
pH: 5 (ref 5.0–8.0)

## 2017-11-26 LAB — POCT PREGNANCY, URINE: Preg Test, Ur: NEGATIVE

## 2017-11-26 MED ORDER — ONDANSETRON 8 MG PO TBDP: 8.0000 mg | ORAL_TABLET | Freq: Three times a day (TID) | ORAL | 0 refills | Status: DC | PRN

## 2017-11-26 NOTE — ED Provider Notes (Addendum)
[facility]   [id number] 11/26/17 Arrival Time: 1240   SUBJECTIVE:  Jenna Henry is a 22 y.o. female who presents to the urgent care with complaint of abdominal pain, nausea x2 weeks, concerned for pregnancy, took test two weeks ago that was negative.   Last normal menstrual period was May 25  Patient has polycystic ovary disease.  She is G1, P0 with miscarriage.  The nausea occurs in the morning and then late at night.  Appetite has not been changed.  The abdominal pain is midline and periumbilical, and mild.  Patient has not missed work.  She works in a [employer].  There is been no diarrhea but she has vomited on 2 different occasions.  Patient states that Zofran makes her nauseated   Past Medical History:  Diagnosis Date  . Anxiety   . Asthma   . Depression   . Herpes   . Polycystic ovaries    Family History  Problem Relation Age of Onset  . Hypertension Mother   . Heart murmur Father   . Heart disease Maternal Grandfather   . Diabetes Paternal Grandmother   . Cancer Paternal Grandmother    Social History   Socioeconomic History  . Marital status: Single    Spouse name: Not on file  . Number of children: Not on file  . Years of education: Not on file  . Highest education level: Not on file  Occupational History  . Not on file  Social Needs  . Financial resource strain: Not on file  . Food insecurity:    Worry: Not on file    Inability: Not on file  . Transportation needs:    Medical: Not on file    Non-medical: Not on file  Tobacco Use  . Smoking status: Current Some Day Smoker    Packs/day: 1.00  . Smokeless tobacco: Never Used  . Tobacco comment: pt reports smokeing 1 Black and mild daily  Substance and Sexual Activity  . Alcohol use: No  . Drug use: Yes    Types: Marijuana  . Sexual activity: Yes  Lifestyle  . Physical activity:    Days per week: Not on file    Minutes per session: Not on file  . Stress: Not on file    Relationships  . Social connections:    Talks on phone: Not on file    Gets together: Not on file    Attends religious service: Not on file    Active member of club or organization: Not on file    Attends meetings of clubs or organizations: Not on file    Relationship status: Not on file  . Intimate partner violence:    Fear of current or ex partner: Not on file    Emotionally abused: Not on file    Physically abused: Not on file    Forced sexual activity: Not on file  Other Topics Concern  . Not on file  Social History Narrative  . Not on file   No outpatient medications have been marked as taking for the 11/26/17 encounter [facility](Hospital Encounter).   Allergies  Allergen Reactions  . Ortho Tri-Cyclen [Norgestimate-Eth Estradiol] Hives, Itching, Rash and Other (See Comments)    "Burning all over"      ROS: As per HPI, remainder of ROS negative.   OBJECTIVE:   Vitals:   11/26/17 1304  BP: 108/69  Pulse: 62  Resp: 18  Temp: (!) 97.4 F (36.3 C)  SpO2: 100%  General appearance: alert; no distress Eyes: PERRL; EOMI; conjunctiva normal HENT: normocephalic; atraumatic; TMs normal, canal normal, external ears normal without trauma; nasal mucosa normal; oral mucosa normal Neck: supple Lungs: clear to auscultation bilaterally Heart: regular rate and rhythm Abdomen: soft, non-tender; bowel sounds normal; no masses or organomegaly; no guarding or rebound tenderness Back: no CVA tenderness Extremities: no cyanosis or edema; symmetrical with no gross deformities Skin: warm and dry Neurologic: normal gait; grossly normal Psychological: alert and cooperative; normal mood and affect      Labs:  Results for orders placed or performed during the hospital encounter of 11/26/17  POCT urinalysis dip (device)  Result Value Ref Range   Glucose, UA NEGATIVE NEGATIVE mg/dL   Bilirubin Urine NEGATIVE NEGATIVE   Ketones, ur NEGATIVE NEGATIVE mg/dL   Specific Gravity, Urine  1.020 1.005 - 1.030   Hgb urine dipstick NEGATIVE NEGATIVE   pH 5.0 5.0 - 8.0   Protein, ur NEGATIVE NEGATIVE mg/dL   Urobilinogen, UA 0.2 0.0 - 1.0 mg/dL   Nitrite NEGATIVE NEGATIVE   Leukocytes, UA NEGATIVE NEGATIVE  Pregnancy, urine POC  Result Value Ref Range   Preg Test, Ur NEGATIVE NEGATIVE    Labs Reviewed  POCT URINALYSIS DIP (DEVICE)  POCT PREGNANCY, URINE    No results found.     ASSESSMENT & PLAN:  1. Nausea and vomiting, intractability of vomiting not specified, unspecified vomiting type   2. Abdominal pain, periumbilical   3. Polycystic ovary   At this point with the pregnancy test being negative, the next step would be to get an ultrasound at [facility].   Meds ordered this encounter  Medications  . DISCONTD: ondansetron (ZOFRAN-ODT) 8 MG disintegrating tablet    Sig: Take 1 tablet (8 mg total) by mouth every 8 (eight) hours as needed for nausea.    Dispense:  12 tablet    Refill:  0    Reviewed expectations re: course of current medical issues. Questions answered. Outlined signs and symptoms indicating need for more acute intervention. Patient verbalized understanding. After Visit Summary given.     Elvina Sidle, MD 11/26/17 1323    Elvina Sidle, MD 11/26/17 1325

## 2017-11-26 NOTE — Telephone Encounter (Signed)
Will forward to pcp

## 2017-11-26 NOTE — Telephone Encounter (Signed)
Patient came to the facility requesting a refill on her Herpes medication. Please f/u

## 2017-11-26 NOTE — Discharge Instructions (Addendum)
At this point with the pregnancy test being negative, the next step would be to get an ultrasound at [facility].  I have prescribed some medicine for nausea which should help.

## 2017-11-26 NOTE — ED Triage Notes (Signed)
Pt c/o abdominal pain, nausea x2 weeks, concerned for pregnancy, took test two weeks ago that was negative.

## 2017-11-28 MED ORDER — VALACYCLOVIR HCL 500 MG PO TABS: 500.0000 mg | ORAL_TABLET | Freq: Every day | ORAL | 2 refills | Status: DC

## 2017-12-05 ENCOUNTER — Encounter (HOSPITAL_COMMUNITY): Payer: Self-pay | Admitting: *Deleted

## 2017-12-05 ENCOUNTER — Other Ambulatory Visit: Payer: Self-pay

## 2017-12-05 ENCOUNTER — Emergency Department (HOSPITAL_COMMUNITY): Payer: Medicaid Other

## 2017-12-05 ENCOUNTER — Emergency Department (HOSPITAL_COMMUNITY)
Admission: EM | Admit: 2017-12-05 | Discharge: 2017-12-05 | Disposition: A | Discharge status: Home / Self Care | Payer: Medicaid Other | Attending: Emergency Medicine | Admitting: Emergency Medicine

## 2017-12-05 DIAGNOSIS — F17210 Nicotine dependence, cigarettes, uncomplicated: Secondary | ICD-10-CM | POA: Insufficient documentation

## 2017-12-05 DIAGNOSIS — R1013 Epigastric pain: Secondary | ICD-10-CM

## 2017-12-05 DIAGNOSIS — Z79899 Other long term (current) drug therapy: Secondary | ICD-10-CM | POA: Insufficient documentation

## 2017-12-05 DIAGNOSIS — J45909 Unspecified asthma, uncomplicated: Secondary | ICD-10-CM | POA: Insufficient documentation

## 2017-12-05 LAB — URINALYSIS, ROUTINE W REFLEX MICROSCOPIC
BACTERIA UA: NONE SEEN
BILIRUBIN URINE: NEGATIVE
Glucose, UA: NEGATIVE mg/dL
HGB URINE DIPSTICK: NEGATIVE
Ketones, ur: NEGATIVE mg/dL
Nitrite: NEGATIVE
Protein, ur: NEGATIVE mg/dL
Specific Gravity, Urine: 1.025 (ref 1.005–1.030)
pH: 7 (ref 5.0–8.0)

## 2017-12-05 LAB — COMPREHENSIVE METABOLIC PANEL
ALBUMIN: 4.4 g/dL (ref 3.5–5.0)
ALK PHOS: 64 U/L (ref 38–126)
ALT: 22 U/L (ref 0–44)
ANION GAP: 8 (ref 5–15)
AST: 21 U/L (ref 15–41)
BUN: 9 mg/dL (ref 6–20)
CALCIUM: 10.8 mg/dL — AB (ref 8.9–10.3)
CHLORIDE: 109 mmol/L (ref 98–111)
CO2: 25 mmol/L (ref 22–32)
CREATININE: 0.71 mg/dL (ref 0.44–1.00)
GFR calc Af Amer: >60 mL/min (ref >60)
GFR calc non Af Amer: >60 mL/min (ref >60)
GLUCOSE: 79 mg/dL (ref 70–99)
Potassium: 3.6 mmol/L (ref 3.5–5.1)
SODIUM: 142 mmol/L (ref 135–145)
Total Bilirubin: 0.4 mg/dL (ref 0.3–1.2)
Total Protein: 8.1 g/dL (ref 6.5–8.1)

## 2017-12-05 LAB — CBC
HCT: 46 % (ref 36.0–46.0)
HEMOGLOBIN: 15.5 g/dL — AB (ref 12.0–15.0)
MCH: 32.1 pg (ref 26.0–34.0)
MCHC: 33.7 g/dL (ref 30.0–36.0)
MCV: 95.2 fL (ref 78.0–100.0)
Platelets: 269 10*3/uL (ref 150–400)
RBC: 4.83 MIL/uL (ref 3.87–5.11)
RDW: 13.2 % (ref 11.5–15.5)
WBC: 5.2 10*3/uL (ref 4.0–10.5)

## 2017-12-05 LAB — I-STAT BETA HCG BLOOD, ED (MC, WL, AP ONLY)

## 2017-12-05 LAB — LIPASE, BLOOD: Lipase: 31 U/L (ref 11–51)

## 2017-12-05 MED ORDER — RANITIDINE HCL 150 MG PO CAPS: 150.0000 mg | ORAL_CAPSULE | Freq: Two times a day (BID) | ORAL | 0 refills | Status: DC

## 2017-12-05 NOTE — ED Triage Notes (Signed)
Pt says that she swallowed the top ball of her (plastic) tongue ring yesterday. C/o midline abdominal pain that started last night associated with vomiting (2 episodes).

## 2017-12-05 NOTE — ED Provider Notes (Signed)
[facility]-EMERGENCY DEPT Provider Note   CSN: [id number] Arrival date & time: 12/05/17  2019     History   Chief Complaint Chief Complaint  Patient presents with  . Abdominal Pain    HPI Jenna Henry is a 22 y.o. female.  The history is provided by the patient. No language interpreter was used.    Jenna Henry is a 22 y.o. female who presents to the Emergency Department complaining of abdominal pain. She reports epigastric abdominal pain that began last night. Yesterday in the morning she swallowed the top ball on her tongue piercing accidentally. She states that there is a plastic ball. She vomited twice since the pain began. She denies any fevers, dysuria, diarrhea, constipation. Symptoms are mild to moderate and constant nature.  Past Medical History:  Diagnosis Date  . Anxiety   . Asthma   . Depression   . Herpes   . Polycystic ovaries     Patient Active Problem List   Diagnosis Date Noted  . 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 Palpitations 11/09/2017  . Chest pain 11/09/2017  . Cigarette smoker 11/09/2017  . Severe major depression without psychotic features (HCC) 02/28/2017  . 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 Suicide attempt (HCC) 02/28/2017  . Anxiety state 02/28/2017  . Insomnia 02/28/2017  . Herpes 11/12/2016  . Irregular menstruation 07/06/2016    Past Surgical History:  Procedure Laterality Date  . CYSTECTOMY       OB History   None      Home Medications    Prior to Admission medications   Medication Sig Start Date End Date Taking? Authorizing Provider  ranitidine (ZANTAC) 150 MG capsule Take 1 capsule (150 mg total) by mouth 2 (two) times daily. 12/05/17   Tilden Fossa, MD  valACYclovir (VALTREX) 500 MG tablet Take 1 tablet (500 mg total) by mouth daily. 11/28/17   Marcine Matar, MD    Family History Family History  Problem Relation Age of Onset  .  Hypertension Mother   . Heart murmur Father   . Heart disease Maternal Grandfather   . Diabetes Paternal Grandmother   . Cancer Paternal Grandmother     Social History Social History   Tobacco Use  . Smoking status: Current Some Day Smoker    Packs/day: 1.00  . Smokeless tobacco: Never Used  . Tobacco comment: pt reports smokeing 1 Black and mild daily  Substance Use Topics  . Alcohol use: No  . Drug use: Yes    Types: Marijuana     Allergies   Ortho tri-cyclen [norgestimate-eth estradiol]   Review of Systems Review of Systems  All other systems reviewed and are negative.    Physical Exam Updated Vital Signs BP 102/70 (BP Location: Left Arm)   Pulse 72   Temp 98.2 F (36.8 C) (Oral)   Resp 18   Ht 5\' 5"  (1.651 m)   Wt 79.8 kg (176 lb)   LMP 11/29/2017   SpO2 100%   BMI 29.29 kg/m   Physical Exam  Constitutional: She is oriented to person, place, and time. She appears well-developed and well-nourished.  HENT:  Head: Normocephalic and atraumatic.  Cardiovascular: Normal rate and regular rhythm.  No murmur heard. Pulmonary/Chest: Effort normal and breath sounds normal. No respiratory distress.  Abdominal: Soft. There is no rebound and no guarding.  Mild epigastric pain  Musculoskeletal: She exhibits no edema or tenderness.  Neurological: She is alert and oriented to  person, place, and time.  Skin: Skin is warm and dry.  Psychiatric: She has a normal mood and affect. Her behavior is normal.  Nursing note and vitals reviewed.    ED Treatments / Results  Labs (all labs ordered are listed, but only abnormal results are displayed) Labs Reviewed  COMPREHENSIVE METABOLIC PANEL - Abnormal; Notable for the following components:      Result Value   Calcium 10.8 (*)    All other components within normal limits  CBC - Abnormal; Notable for the following components:   Hemoglobin 15.5 (*)    All other components within normal limits  URINALYSIS, ROUTINE W  REFLEX MICROSCOPIC - Abnormal; Notable for the following components:   APPearance HAZY (*)    Leukocytes, UA TRACE (*)    All other components within normal limits  LIPASE, BLOOD  I-STAT BETA HCG BLOOD, ED ([facility], [facility], [facility] ONLY)    EKG None  Radiology Dg Abdomen 1 View  Result Date: 12/05/2017 CLINICAL DATA:  Midline abdominal pain and vomiting after swallowing a plastic ball. EXAM: ABDOMEN - 1 VIEW COMPARISON:  No comparison studies available. FINDINGS: Supine view of the abdomen and pelvis shows a normal bowel gas pattern. No evidence for radiopaque foreign body. Visualized bony anatomy is normal. IMPRESSION: 1. No evidence for bowel obstruction. 2. No radiopaque foreign body identified in the abdomen or pelvis. Electronically Signed   By: Kennith CenterEric  Mansell M.D.   On: 12/05/2017 21:43    Procedures Procedures (including critical care time)  Medications Ordered in ED Medications - No data to display   Initial Impression / Assessment and Plan / ED Course  I have reviewed the triage vital signs and the nursing notes.  Pertinent labs & imaging results that were available during my care of the patient were reviewed by me and considered in my medical decision making (see chart for details).     Patient here for evaluation of epigastric pain after following the ball off of her tongue ring. She has mild epigastric tenderness without any peritoneal findings. Presentation is not consistent with acute pancreatitis, cholecystitis, bowel perforation, acute appendicitis. Discussed with patient home care for epigastric pain, possible gastritis after swallowed foreign body. Discussed outpatient follow-up and return precautions.  Final Clinical Impressions(s) / ED Diagnoses   Final diagnoses:  Epigastric pain    ED Discharge Orders        Ordered    ranitidine (ZANTAC) 150 MG capsule  2 times daily     12/05/17 2219       Tilden Fossaees, Leeyah Heather, MD 12/05/17 2324

## 2017-12-12 ENCOUNTER — Ambulatory Visit (HOSPITAL_COMMUNITY)
Admission: EM | Admit: 2017-12-12 | Discharge: 2017-12-12 | Disposition: A | Discharge status: Home / Self Care | Payer: Medicaid Other | Attending: Physician Assistant | Admitting: Physician Assistant

## 2017-12-12 ENCOUNTER — Encounter (HOSPITAL_COMMUNITY): Payer: Self-pay | Admitting: Emergency Medicine

## 2017-12-12 ENCOUNTER — Other Ambulatory Visit: Payer: Self-pay

## 2017-12-12 DIAGNOSIS — K649 Unspecified hemorrhoids: Secondary | ICD-10-CM

## 2017-12-12 DIAGNOSIS — K5900 Constipation, unspecified: Secondary | ICD-10-CM

## 2017-12-12 MED ORDER — POLYETHYLENE GLYCOL 3350 17 G PO PACK: 17.0000 g | PACK | Freq: Two times a day (BID) | ORAL | 0 refills | Status: DC

## 2017-12-12 MED ORDER — DOCUSATE SODIUM 100 MG PO CAPS: 100.0000 mg | ORAL_CAPSULE | Freq: Two times a day (BID) | ORAL | 0 refills | Status: DC

## 2017-12-12 MED ORDER — SENNOSIDES-DOCUSATE SODIUM 8.6-50 MG PO TABS: 1.0000 | ORAL_TABLET | Freq: Every day | ORAL | 0 refills | Status: DC

## 2017-12-12 NOTE — ED Provider Notes (Signed)
12/12/2017 1:47 PM   DOB: 06/19/1995 / MRN: [id number]  SUBJECTIVE:  Jenna Henry is a 22 y.o. female presenting for constipation and pain with defecation. Last BM was 6 days ago and she is holdling due to pain.  No taking medications.  Lifetime of constipation.   She is allergic to ortho tri-cyclen [norgestimate-eth estradiol].   She  has a past medical history of Anxiety, Asthma, Depression, Herpes, and Polycystic ovaries.    She  reports that she has been smoking.  She has been smoking about 1.00 pack per day. She has never used smokeless tobacco. She reports that she has current or past drug history. Drug: Marijuana. She reports that she does not drink alcohol. She  reports that she currently engages in sexual activity. The patient  has a past surgical history that includes Cystectomy.  Her family history includes Cancer in her paternal grandmother; Diabetes in her paternal grandmother; Heart disease in her maternal grandfather; Heart murmur in her father; Hypertension in her mother.  Review of Systems  Constitutional: Negative for chills, diaphoresis and fever.  Gastrointestinal: Positive for blood in stool and constipation. Negative for abdominal pain, diarrhea, heartburn, melena, nausea and vomiting.  Skin: Negative for rash.  Neurological: Negative for dizziness.    OBJECTIVE:  BP 107/66 (BP Location: Left Arm)   Pulse (!) 57   Temp 98 F (36.7 C) (Oral)   Resp 18   LMP 11/29/2017   SpO2 100%   Wt Readings from Last 3 Encounters:  12/05/17 176 lb (79.8 kg) (93 %, Z= 1.51)*  11/09/17 168 lb (76.2 kg) (91 %, Z= 1.33)*  11/03/17 176 lb (79.8 kg) (93 %, Z= 1.51)*   * Growth percentiles are based on CDC (Girls, 2-20 Years) data.   Temp Readings from Last 3 Encounters:  12/12/17 98 F (36.7 C) (Oral)  12/05/17 98.2 F (36.8 C) (Oral)  11/26/17 (!) 97.4 F (36.3 C)   BP Readings from Last 3 Encounters:  12/12/17 107/66  12/05/17 102/70  11/26/17 108/69   Pulse Readings  from Last 3 Encounters:  12/12/17 (!) 57  12/05/17 72  11/26/17 62    Physical Exam  Constitutional: She is oriented to person, place, and time. She appears well-nourished. No distress.  Eyes: Pupils are equal, round, and reactive to light. EOM are normal.  Cardiovascular: Normal rate.  Pulmonary/Chest: Effort normal.  Abdominal: She exhibits no distension.  Genitourinary:     Neurological: She is alert and oriented to person, place, and time. No cranial nerve deficit. Gait normal.  Skin: Skin is dry. She is not diaphoretic.  Psychiatric: She has a normal mood and affect.  Vitals reviewed.   No results found for this or any previous visit (from the past 72 hour(s)).  No results found.  ASSESSMENT AND PLAN:   Constipation, unspecified constipation type    Discharge Instructions     Take meds.  Try to have a bowel movement ASAP.        The patient is advised to call or return to clinic if she does not see an improvement in symptoms, or to seek the care of the closest emergency department if she worsens with the above plan.   Deliah BostonMichael Dyshon Philbin, MHS, PA-C 12/12/2017 1:47 PM   Ofilia Neaslark, Zan Triska L, PA-C 12/12/17 1347

## 2017-12-12 NOTE — Discharge Instructions (Signed)
Take meds.  Try to have a bowel movement ASAP.

## 2017-12-12 NOTE — ED Triage Notes (Signed)
Patient reports constipation, hard stools, blood in stool and pain only with having a bowel movement

## 2017-12-19 ENCOUNTER — Emergency Department (HOSPITAL_COMMUNITY)
Admission: EM | Admit: 2017-12-19 | Discharge: 2017-12-19 | Discharge status: Against Medical Advice | Payer: Medicaid Other | Attending: Emergency Medicine | Admitting: Emergency Medicine

## 2017-12-19 ENCOUNTER — Encounter (HOSPITAL_COMMUNITY): Payer: Self-pay | Admitting: Emergency Medicine

## 2017-12-19 DIAGNOSIS — Z5321 Procedure and treatment not carried out due to patient leaving prior to being seen by health care provider: Secondary | ICD-10-CM | POA: Insufficient documentation

## 2017-12-19 DIAGNOSIS — R109 Unspecified abdominal pain: Secondary | ICD-10-CM | POA: Insufficient documentation

## 2017-12-19 LAB — COMPREHENSIVE METABOLIC PANEL
ALBUMIN: 3.7 g/dL (ref 3.5–5.0)
ALT: 14 U/L (ref 0–44)
AST: 20 U/L (ref 15–41)
Alkaline Phosphatase: 56 U/L (ref 38–126)
Anion gap: 6 (ref 5–15)
BUN: 5 mg/dL — AB (ref 6–20)
CO2: 22 mmol/L (ref 22–32)
Calcium: 10.2 mg/dL (ref 8.9–10.3)
Chloride: 110 mmol/L (ref 98–111)
Creatinine, Ser: 0.66 mg/dL (ref 0.44–1.00)
GFR calc Af Amer: >60 mL/min (ref >60)
GFR calc non Af Amer: >60 mL/min (ref >60)
GLUCOSE: 100 mg/dL — AB (ref 70–99)
POTASSIUM: 3.8 mmol/L (ref 3.5–5.1)
SODIUM: 138 mmol/L (ref 135–145)
Total Bilirubin: 0.5 mg/dL (ref 0.3–1.2)
Total Protein: 6.8 g/dL (ref 6.5–8.1)

## 2017-12-19 LAB — URINALYSIS, ROUTINE W REFLEX MICROSCOPIC
BILIRUBIN URINE: NEGATIVE
GLUCOSE, UA: NEGATIVE mg/dL
HGB URINE DIPSTICK: NEGATIVE
Ketones, ur: NEGATIVE mg/dL
Leukocytes, UA: NEGATIVE
Nitrite: NEGATIVE
Protein, ur: NEGATIVE mg/dL
Specific Gravity, Urine: 1.019 (ref 1.005–1.030)
pH: 5 (ref 5.0–8.0)

## 2017-12-19 LAB — LIPASE, BLOOD: LIPASE: 32 U/L (ref 11–51)

## 2017-12-19 LAB — CBC
HEMATOCRIT: 41.7 % (ref 36.0–46.0)
Hemoglobin: 13.5 g/dL (ref 12.0–15.0)
MCH: 31.3 pg (ref 26.0–34.0)
MCHC: 32.4 g/dL (ref 30.0–36.0)
MCV: 96.5 fL (ref 78.0–100.0)
Platelets: 227 10*3/uL (ref 150–400)
RBC: 4.32 MIL/uL (ref 3.87–5.11)
RDW: 13 % (ref 11.5–15.5)
WBC: 6.9 10*3/uL (ref 4.0–10.5)

## 2017-12-19 LAB — I-STAT BETA HCG BLOOD, ED (MC, WL, AP ONLY)

## 2017-12-19 NOTE — ED Notes (Signed)
Called PT from lobby x 3 - no answer.

## 2017-12-19 NOTE — ED Notes (Signed)
Called pt for last call. No answer

## 2017-12-19 NOTE — ED Triage Notes (Signed)
Reports nausea for a couple of weeks.  Taking abdominal pain for a week and a half.  Describes as a burning.  Also endorses belching a lot of acid.  Taking zantac otc.  Last dose Wednesday.

## 2018-01-10 ENCOUNTER — Ambulatory Visit (HOSPITAL_COMMUNITY)
Admission: EM | Admit: 2018-01-10 | Discharge: 2018-01-10 | Disposition: A | Discharge status: Home / Self Care | Payer: Self-pay | Attending: Family Medicine | Admitting: Family Medicine

## 2018-01-10 ENCOUNTER — Ambulatory Visit (INDEPENDENT_AMBULATORY_CARE_PROVIDER_SITE_OTHER): Payer: Self-pay

## 2018-01-10 ENCOUNTER — Encounter (HOSPITAL_COMMUNITY): Payer: Self-pay

## 2018-01-10 DIAGNOSIS — R1030 Lower abdominal pain, unspecified: Secondary | ICD-10-CM

## 2018-01-10 LAB — POCT URINALYSIS DIP (DEVICE)
Bilirubin Urine: NEGATIVE
GLUCOSE, UA: NEGATIVE mg/dL
Ketones, ur: NEGATIVE mg/dL
LEUKOCYTES UA: NEGATIVE
NITRITE: NEGATIVE
Protein, ur: NEGATIVE mg/dL
Specific Gravity, Urine: 1.025 (ref 1.005–1.030)
UROBILINOGEN UA: 0.2 mg/dL (ref 0.0–1.0)
pH: 6 (ref 5.0–8.0)

## 2018-01-10 LAB — POCT PREGNANCY, URINE
PREG TEST UR: NEGATIVE
Preg Test, Ur: NEGATIVE

## 2018-01-10 MED ORDER — GI COCKTAIL ~~LOC~~
ORAL | Status: AC
Start: 2018-01-10 — End: ?
  Filled 2018-01-10: qty 30

## 2018-01-10 MED ORDER — GI COCKTAIL ~~LOC~~
30.0000 mL | Freq: Once | ORAL | Status: AC
  Administered 2018-01-10: 30 mL via ORAL

## 2018-01-10 MED ORDER — OMEPRAZOLE 20 MG PO CPDR: 20.0000 mg | DELAYED_RELEASE_CAPSULE | Freq: Every day | ORAL | 0 refills | Status: DC

## 2018-01-10 MED ORDER — POVIDONE-IODINE 10 % EX SOLN
CUTANEOUS | Status: AC
  Filled 2018-01-10: qty 118

## 2018-01-10 MED ORDER — ONDANSETRON 4 MG PO TBDP: 4.0000 mg | ORAL_TABLET | Freq: Three times a day (TID) | ORAL | 0 refills | Status: DC | PRN

## 2018-01-10 NOTE — ED Triage Notes (Signed)
Pt presents with flare up of acid reflux (having abdominal pain and vomiting )

## 2018-01-10 NOTE — ED Provider Notes (Signed)
[facility]    CSN: [id number] Arrival date & time: 01/10/18  1012     History   Chief Complaint Chief Complaint  Patient presents with  . Gastroesophageal Reflux    HPI Jenna Henry is a 22 y.o. female.   Jenna Henry presents with complaints of low abdominal pain, belching, low back pain and episode of emesis this morning. Mild nausea. Started yesterday. Ate hot wings two days ago which she feels may be contributing to symptoms. Has had reflux in the past with somewhat similar symptoms. Had a BM yesterday which was normal for her, no straining or diarrhea. No urinary symptoms. Denies vaginal symptoms No fever. No known ill contacts. Ate eggs this morning. drinking fluids. Has tried taking rolaids which have briefly helped with symptoms. Does not take preventative antacid any more. LMP 8/1, not on birth control. Hx of anxiety, asthma, depression, PCOS. Per chart review was seen for similar 6/28 with recommendation for pelvic ultrasound which does not appear to have been completed; had epigastric pain and was seen in ER 7/7, and was here for constipation 7/14.    ROS per HPI.      Past Medical History:  Diagnosis Date  . Anxiety   . Asthma   . Depression   . Herpes   . Polycystic ovaries     Patient Active Problem List   Diagnosis Date Noted  . 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 Palpitations 11/09/2017  . Chest pain 11/09/2017  . Cigarette smoker 11/09/2017  . Severe major depression without psychotic features (HCC) 02/28/2017  . 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 Suicide attempt (HCC) 02/28/2017  . Anxiety state 02/28/2017  . Insomnia 02/28/2017  . Herpes 11/12/2016  . Irregular menstruation 07/06/2016    Past Surgical History:  Procedure Laterality Date  . CYSTECTOMY      OB History   None      Home Medications    Prior to Admission medications   Medication Sig Start Date End Date Taking?  Authorizing Provider  docusate sodium (COLACE) 100 MG capsule Take 1 capsule (100 mg total) by mouth every 12 (twelve) hours. 12/12/17   Ofilia Neas, PA-C  omeprazole (PRILOSEC) 20 MG capsule Take 1 capsule (20 mg total) by mouth daily. 01/10/18   Georgetta Haber, NP  ondansetron (ZOFRAN-ODT) 4 MG disintegrating tablet Take 1 tablet (4 mg total) by mouth every 8 (eight) hours as needed for nausea or vomiting. 01/10/18   Linus Mako B, NP  polyethylene glycol (MIRALAX) packet Take 17 g by mouth 2 (two) times daily. Take 2-3 times per day for mild to moderate constipation. 12/12/17   Ofilia Neas, PA-C  ranitidine (ZANTAC) 150 MG capsule Take 1 capsule (150 mg total) by mouth 2 (two) times daily. 12/05/17   Tilden Fossa, MD  senna-docusate (SENOKOT-S) 8.6-50 MG tablet Take 1 tablet by mouth daily. Start tomorrow, if needed, after using stol softeners. 12/12/17   Ofilia Neas, PA-C  valACYclovir (VALTREX) 500 MG tablet Take 1 tablet (500 mg total) by mouth daily. 11/28/17   Marcine Matar, MD    Family History Family History  Problem Relation Age of Onset  . Hypertension Mother   . Heart murmur Father   . Heart disease Maternal Grandfather   . Diabetes Paternal Grandmother   . Cancer Paternal Grandmother     Social History Social History   Tobacco Use  . Smoking status: Current Some Day Smoker  Packs/day: 1.00  . Smokeless tobacco: Never Used  . Tobacco comment: pt reports smokeing 1 Black and mild daily  Substance Use Topics  . Alcohol use: No  . Drug use: Yes    Types: Marijuana     Allergies   Ortho tri-cyclen [norgestimate-eth estradiol]   Review of Systems Review of Systems   Physical Exam Triage Vital Signs ED Triage Vitals  Enc Vitals Group     BP 01/10/18 1047 108/64     Pulse Rate 01/10/18 1040 60     Resp 01/10/18 1040 20     Temp 01/10/18 1040 97.8 F (36.6 C)     Temp Source 01/10/18 1040 Oral     SpO2 01/10/18 1040 98 %     Weight --        Height --      Head Circumference --      Peak Flow --      Pain Score 01/10/18 1038 9     Pain Loc --      Pain Edu? --      Excl. in GC? --    No data found.  Updated Vital Signs BP 108/64   Pulse 60   Temp 97.8 F (36.6 C) (Oral)   Resp 20   LMP 12/30/2017   SpO2 98%    Physical Exam  Constitutional: She is oriented to person, place, and time. She appears well-developed and well-nourished. No distress.  Cardiovascular: Normal rate, regular rhythm and normal heart sounds.  Pulmonary/Chest: Effort normal and breath sounds normal.  Abdominal: Soft. Bowel sounds are increased. There is no hepatosplenomegaly, splenomegaly or hepatomegaly. There is tenderness in the right lower quadrant, periumbilical area, suprapubic area and left lower quadrant. There is no rigidity, no rebound, no guarding, no CVA tenderness, no tenderness at McBurney's point and negative Murphy's sign. No hernia.  Neurological: She is alert and oriented to person, place, and time.  Skin: Skin is warm and dry.     UC Treatments / Results  Labs (all labs ordered are listed, but only abnormal results are displayed) Labs Reviewed  POCT URINALYSIS DIP (DEVICE) - Abnormal; Notable for the following components:      Result Value   Hgb urine dipstick TRACE (*)    All other components within normal limits  POCT PREGNANCY, URINE  POCT PREGNANCY, URINE    EKG None  Radiology Dg Abd 2 Views  Result Date: 01/10/2018 CLINICAL DATA:  Bilateral lower quadrant abdomen pain EXAM: ABDOMEN - 2 VIEW COMPARISON:  December 05, 2017 FINDINGS: The bowel gas pattern is normal. There is no evidence of free air. No radio-opaque calculi or other significant radiographic abnormality is seen. IMPRESSION: Negative. Electronically Signed   By: Sherian ReinWei-Chen  Lin M.D.   On: 01/10/2018 11:52    Procedures Procedures (including critical care time)  Medications Ordered in UC Medications  gi cocktail (Maalox,Lidocaine,Donnatal) (30 mLs  Oral Given 01/10/18 1119)    Initial Impression / Assessment and Plan / UC Course  I have reviewed the triage vital signs and the nursing notes.  Pertinent labs & imaging results that were available during my care of the patient were reviewed by me and considered in my medical decision making (see chart for details).     Non toxic in appearance. No further nausea or vomiting. Tolerated po intake. Afebrile. No diarrhea. Gi cocktail provided in clinic today. States symptoms have improved, still with some belching. Noted trace hgb in urine, question kidney stone? No cva  tenderness. Noted gas to xray without abnormal pattern. Has not been using stool softener. Hx of PCOS. Discussed to continue with supportive cares as this could be related to multiple of her chronic issues- constipation, pcos, reflux. zofran prn, miralax prn, prilosec daily. Follow up with PCP as may need to follow with GI and/or pelvic us with gyne. Return precautions provided. Patient verbalized understanding and agreeable to plan.  Ambulatory out of clinic without difficulty.    Final Clinical Impressions(s) / UC Diagnoses   Final diagnoses:  Lower abdominal pain     Discharge Instructions     Please follow up with your primary care provider and/or GI for recheck of symptoms.  Zofran as needed for nausea.  Continue with stool softeners to prevent constipation.  Daily omeprazole.  You may also need pelvic ultrasound to further evaluate these symptoms.  If any worsening of pain, fevers, dehydration or otherwise worsening please return or go to the Er.     ED Prescriptions    Medication Sig Dispense Auth. Provider   omeprazole (PRILOSEC) 20 MG capsule Take 1 capsule (20 mg total) by mouth daily. 30 capsule Nathalia Wismer B, NP   ondansetron (ZOFRAN-ODT) 4 MG disintegrating tablet Take 1 tablet (4 mg total) by mouth every 8 (eight) hours as needed for nausea or vomiting. 12 tablet Georgetta HaberBurky, Tenille Morrill B, NP     Controlled  Substance Prescriptions [place] Controlled Substance Registry consulted? Not Applicable   Georgetta HaberBurky, Vyctoria Dickman B, NP 01/10/18 1208

## 2018-01-10 NOTE — Discharge Instructions (Signed)
Please follow up with your primary care provider and/or GI for recheck of symptoms.  Zofran as needed for nausea.  Continue with stool softeners to prevent constipation.  Daily omeprazole.  You may also need pelvic ultrasound to further evaluate these symptoms.  If any worsening of pain, fevers, dehydration or otherwise worsening please return or go to the Er.

## 2018-01-11 ENCOUNTER — Encounter (HOSPITAL_COMMUNITY): Payer: Self-pay | Admitting: *Deleted

## 2018-01-11 ENCOUNTER — Emergency Department (HOSPITAL_COMMUNITY)
Admission: EM | Admit: 2018-01-11 | Discharge: 2018-01-11 | Disposition: A | Discharge status: Home / Self Care | Payer: Medicaid Other | Attending: Emergency Medicine | Admitting: Emergency Medicine

## 2018-01-11 ENCOUNTER — Encounter (HOSPITAL_BASED_OUTPATIENT_CLINIC_OR_DEPARTMENT_OTHER): Payer: Self-pay | Admitting: *Deleted

## 2018-01-11 ENCOUNTER — Other Ambulatory Visit: Payer: Self-pay

## 2018-01-11 ENCOUNTER — Emergency Department (HOSPITAL_BASED_OUTPATIENT_CLINIC_OR_DEPARTMENT_OTHER)
Admission: EM | Admit: 2018-01-11 | Discharge: 2018-01-11 | Discharge status: Against Medical Advice | Payer: Medicaid Other | Attending: Emergency Medicine | Admitting: Emergency Medicine

## 2018-01-11 ENCOUNTER — Emergency Department (HOSPITAL_COMMUNITY): Payer: Medicaid Other

## 2018-01-11 DIAGNOSIS — R0602 Shortness of breath: Secondary | ICD-10-CM | POA: Insufficient documentation

## 2018-01-11 DIAGNOSIS — F410 Panic disorder [episodic paroxysmal anxiety] without agoraphobia: Secondary | ICD-10-CM | POA: Insufficient documentation

## 2018-01-11 DIAGNOSIS — Z5321 Procedure and treatment not carried out due to patient leaving prior to being seen by health care provider: Secondary | ICD-10-CM | POA: Insufficient documentation

## 2018-01-11 NOTE — ED Provider Notes (Signed)
Patient left prior to me seeing them. No answer from waiting room per nursing staff   Bill SalinasMorelli, Lacole Komorowski A, PA-C 01/11/18 1709    Tegeler, Canary Brimhristopher J, MD 01/19/18 [phone]

## 2018-01-11 NOTE — ED Provider Notes (Addendum)
Patient placed in Quick Look pathway, seen and evaluated   Chief Complaint: short of breath.    HPI:   Pt seen yesterday for abdominal pain.  Pt upset when she became short of breath. Pt reports she feels some better now   ROS: no fever, no nausea or vomiting  Physical Exam:   Gen: No distress vitals normal   Neuro: Awake and Alert  Skin: Warm    Focused Exam: Lungs clear, Heart rrr   Initiation of care has begun. The patient has been counseled on the process, plan, and necessity for staying for the completion/evaluation, and the remainder of the medical screening examination   Elson AreasSofia, Rui Wordell K, PA-C 01/11/18 1502    Elson AreasSofia, Keymarion Bearman K, PA-C 01/11/18 1711    Derwood KaplanNanavati, Ankit, MD 01/20/18 2340

## 2018-01-11 NOTE — ED Notes (Signed)
Called pt name to be placed in room with no response. Will call again.

## 2018-01-11 NOTE — ED Triage Notes (Signed)
States after an argument with her boyfriend she had a panic attack.

## 2018-01-11 NOTE — ED Triage Notes (Signed)
Pt in c/o shortness of breath and chest pain, symptoms started during a disagreement, pt denies cough or congestion, [facility], no distress noted on arrival

## 2018-01-11 NOTE — ED Notes (Signed)
Pt name called for room with no response.

## 2018-01-11 NOTE — ED Notes (Signed)
Pt called x2. No response

## 2018-01-19 ENCOUNTER — Encounter (HOSPITAL_COMMUNITY): Payer: Self-pay | Admitting: *Deleted

## 2018-01-19 ENCOUNTER — Emergency Department (HOSPITAL_COMMUNITY)
Admission: EM | Admit: 2018-01-19 | Discharge: 2018-01-19 | Disposition: A | Discharge status: Home / Self Care | Payer: Medicaid Other | Attending: Emergency Medicine | Admitting: Emergency Medicine

## 2018-01-19 DIAGNOSIS — N898 Other specified noninflammatory disorders of vagina: Secondary | ICD-10-CM | POA: Insufficient documentation

## 2018-01-19 DIAGNOSIS — R1030 Lower abdominal pain, unspecified: Secondary | ICD-10-CM | POA: Insufficient documentation

## 2018-01-19 DIAGNOSIS — M545 Low back pain: Secondary | ICD-10-CM | POA: Insufficient documentation

## 2018-01-19 DIAGNOSIS — Z5321 Procedure and treatment not carried out due to patient leaving prior to being seen by health care provider: Secondary | ICD-10-CM | POA: Insufficient documentation

## 2018-01-19 DIAGNOSIS — R0789 Other chest pain: Secondary | ICD-10-CM | POA: Insufficient documentation

## 2018-01-19 LAB — I-STAT BETA HCG BLOOD, ED (MC, WL, AP ONLY): I-stat hCG, quantitative: <5 m[IU]/mL (ref <5)

## 2018-01-19 LAB — COMPREHENSIVE METABOLIC PANEL
ALT: 17 U/L (ref 0–44)
AST: 20 U/L (ref 15–41)
Albumin: 4.1 g/dL (ref 3.5–5.0)
Alkaline Phosphatase: 58 U/L (ref 38–126)
Anion gap: 8 (ref 5–15)
CO2: 24 mmol/L (ref 22–32)
CREATININE: 0.61 mg/dL (ref 0.44–1.00)
Calcium: 10.6 mg/dL — ABNORMAL HIGH (ref 8.9–10.3)
Chloride: 110 mmol/L (ref 98–111)
GFR calc Af Amer: >60 mL/min (ref >60)
Glucose, Bld: 90 mg/dL (ref 70–99)
Potassium: 3.9 mmol/L (ref 3.5–5.1)
Sodium: 142 mmol/L (ref 135–145)
Total Bilirubin: 0.5 mg/dL (ref 0.3–1.2)
Total Protein: 7.5 g/dL (ref 6.5–8.1)

## 2018-01-19 LAB — LIPASE, BLOOD: Lipase: 29 U/L (ref 11–51)

## 2018-01-19 LAB — CBC
HEMATOCRIT: 40.1 % (ref 36.0–46.0)
HEMOGLOBIN: 13.1 g/dL (ref 12.0–15.0)
MCH: 31.3 pg (ref 26.0–34.0)
MCHC: 32.7 g/dL (ref 30.0–36.0)
MCV: 95.9 fL (ref 78.0–100.0)
Platelets: 235 10*3/uL (ref 150–400)
RBC: 4.18 MIL/uL (ref 3.87–5.11)
RDW: 12.8 % (ref 11.5–15.5)
WBC: 4 10*3/uL (ref 4.0–10.5)

## 2018-01-19 NOTE — ED Notes (Signed)
No answer x2 

## 2018-01-19 NOTE — ED Triage Notes (Signed)
Pt in c/o severe lower abdominal pain that started suddenly while at work, radiates into her lower back, states she has a history of PCOS and that feels. Also c/o chest pain, no distress noted.

## 2018-01-19 NOTE — ED Notes (Signed)
Pt called to room with no response

## 2018-01-19 NOTE — ED Triage Notes (Signed)
Pt also c/o vaginal discharge  

## 2018-01-20 ENCOUNTER — Encounter (HOSPITAL_COMMUNITY): Payer: Self-pay | Admitting: Emergency Medicine

## 2018-01-20 ENCOUNTER — Ambulatory Visit (HOSPITAL_COMMUNITY)
Admission: EM | Admit: 2018-01-20 | Discharge: 2018-01-20 | Disposition: A | Discharge status: Home / Self Care | Payer: Self-pay | Attending: Family Medicine | Admitting: Family Medicine

## 2018-01-20 DIAGNOSIS — Z8249 Family history of ischemic heart disease and other diseases of the circulatory system: Secondary | ICD-10-CM | POA: Insufficient documentation

## 2018-01-20 DIAGNOSIS — Z833 Family history of diabetes mellitus: Secondary | ICD-10-CM | POA: Insufficient documentation

## 2018-01-20 DIAGNOSIS — Z9889 Other specified postprocedural states: Secondary | ICD-10-CM | POA: Insufficient documentation

## 2018-01-20 DIAGNOSIS — Z79899 Other long term (current) drug therapy: Secondary | ICD-10-CM | POA: Insufficient documentation

## 2018-01-20 DIAGNOSIS — Z809 Family history of malignant neoplasm, unspecified: Secondary | ICD-10-CM | POA: Insufficient documentation

## 2018-01-20 DIAGNOSIS — R109 Unspecified abdominal pain: Secondary | ICD-10-CM | POA: Insufficient documentation

## 2018-01-20 DIAGNOSIS — R1084 Generalized abdominal pain: Secondary | ICD-10-CM

## 2018-01-20 DIAGNOSIS — F17210 Nicotine dependence, cigarettes, uncomplicated: Secondary | ICD-10-CM | POA: Insufficient documentation

## 2018-01-20 LAB — POCT URINALYSIS DIP (DEVICE)
BILIRUBIN URINE: NEGATIVE
GLUCOSE, UA: NEGATIVE mg/dL
Hgb urine dipstick: NEGATIVE
Ketones, ur: NEGATIVE mg/dL
LEUKOCYTES UA: NEGATIVE
Nitrite: NEGATIVE
Protein, ur: NEGATIVE mg/dL
Specific Gravity, Urine: 1.02 (ref 1.005–1.030)
Urobilinogen, UA: 1 mg/dL (ref 0.0–1.0)
pH: 7.5 (ref 5.0–8.0)

## 2018-01-20 LAB — POCT PREGNANCY, URINE: PREG TEST UR: NEGATIVE

## 2018-01-20 MED ORDER — ONDANSETRON HCL 4 MG PO TABS: 4.0000 mg | ORAL_TABLET | Freq: Four times a day (QID) | ORAL | 0 refills | Status: DC

## 2018-01-20 MED ORDER — DICYCLOMINE HCL 20 MG PO TABS: 20.0000 mg | ORAL_TABLET | Freq: Two times a day (BID) | ORAL | 0 refills | Status: DC

## 2018-01-20 NOTE — ED Provider Notes (Signed)
[facility]ENTER   [id number] 01/20/18 Arrival Time: 9528  CC: ABDOMINAL DISCOMFORT  SUBJECTIVE:  Jenna Henry is a 22 y.o. female who presents with complaint of abdominal discomfort that began abruptly 3 days ago.  Denies a precipitating event, or trauma. Went to the ED yesterday for similar complaint.  Serum beta HCG was negative.  Lipase, and CBC were WNL.  CMP with mildly elevated Ca.   Localizes discomfort to lower abdomen.  Describes as constant, worsening, and "like something is pushing out."  States pain is 8/10.  Has tried ibuprofen without relief.  Denies association with food.  Denies alleviating factors.  Worse with lying on abdomen.  Denies similar symptoms in the past.  Last BM yesterday and normal for patient.  Complains of nausea yesterday, resolved today, polyuria, and increased urgency and frequency  Denies fever, chills, appetite changes, weight changes, vomiting, chest pain, SOB, diarrhea, constipation, hematochezia, melena, dysuria, difficulty urinating, flank pain, loss of bowel or bladder function, vaginal pain.    Patient's last menstrual period was 12/30/2017.   Patient does not use BC or condoms.  Last unprotected sex a few days ago.    ROS: As per HPI.  Past Medical History:  Diagnosis Date  . Anxiety   . Asthma   . Depression   . Herpes   . Polycystic ovaries    Past Surgical History:  Procedure Laterality Date  . CYSTECTOMY     Allergies  Allergen Reactions  . Ortho Tri-Cyclen [Norgestimate-Eth Estradiol] Hives, Itching, Rash and Other (See Comments)    "Burning all over"   No current facility-administered medications on file prior to encounter.    Current Outpatient Medications on File Prior to Encounter  Medication Sig Dispense Refill  . docusate sodium (COLACE) 100 MG capsule Take 1 capsule (100 mg total) by mouth every 12 (twelve) hours. 60 capsule 0  . omeprazole (PRILOSEC) 20 MG capsule Take 1 capsule (20 mg total) by mouth daily. 30  capsule 0  . ondansetron (ZOFRAN-ODT) 4 MG disintegrating tablet Take 1 tablet (4 mg total) by mouth every 8 (eight) hours as needed for nausea or vomiting. 12 tablet 0  . polyethylene glycol (MIRALAX) packet Take 17 g by mouth 2 (two) times daily. Take 2-3 times per day for mild to moderate constipation. 34 packet 0  . ranitidine (ZANTAC) 150 MG capsule Take 1 capsule (150 mg total) by mouth 2 (two) times daily. 30 capsule 0  . senna-docusate (SENOKOT-S) 8.6-50 MG tablet Take 1 tablet by mouth daily. Start tomorrow, if needed, after using stol softeners. 1 tablet 0  . valACYclovir (VALTREX) 500 MG tablet Take 1 tablet (500 mg total) by mouth daily. 30 tablet 2   Social History   Socioeconomic History  . Marital status: Single    Spouse name: Not on file  . Number of children: Not on file  . Years of education: Not on file  . Highest education level: Not on file  Occupational History  . Not on file  Social Needs  . Financial resource strain: Not on file  . Food insecurity:    Worry: Not on file    Inability: Not on file  . Transportation needs:    Medical: Not on file    Non-medical: Not on file  Tobacco Use  . Smoking status: Current Some Day Smoker    Packs/day: 1.00  . Smokeless tobacco: Never Used  . Tobacco comment: pt reports smokeing 1 Black and mild daily  Substance and  Sexual Activity  . Alcohol use: No  . Drug use: Yes    Types: Marijuana  . Sexual activity: Yes  Lifestyle  . Physical activity:    Days per week: Not on file    Minutes per session: Not on file  . Stress: Not on file  Relationships  . Social connections:    Talks on phone: Not on file    Gets together: Not on file    Attends religious service: Not on file    Active member of club or organization: Not on file    Attends meetings of clubs or organizations: Not on file    Relationship status: Not on file  . Intimate partner violence:    Fear of current or ex partner: Not on file    Emotionally  abused: Not on file    Physically abused: Not on file    Forced sexual activity: Not on file  Other Topics Concern  . Not on file  Social History Narrative  . Not on file   Family History  Problem Relation Age of Onset  . Hypertension Mother   . Heart murmur Father   . Heart disease Maternal Grandfather   . Diabetes Paternal Grandmother   . Cancer Paternal Grandmother      OBJECTIVE:  Vitals:   01/20/18 1011  BP: 111/70  Pulse: 62  Resp: 18  Temp: 98.2 F (36.8 C)  SpO2: 99%    General appearance: AOx3 in no acute distress; nontoxic appearance HEENT: NCAT.  Oropharynx clear.  Lungs: clear to auscultation bilaterally without adventitious breath sounds Heart: regular rate and rhythm.  Radial pulses 2+ symmetrical bilaterally Abdomen: soft, non-distended; normal active bowel sounds; tender to palpation over the suprapubic region; nontender at McBurney's point; negative Murphy's sign; negative rebound; no guarding Back: no CVA tenderness Extremities: no edema; symmetrical with no gross deformities Skin: warm and dry Neurologic: normal gait; ambulates without difficulty.   Psychological: alert and cooperative; normal mood and affect  Labs: Results for orders placed or performed during the hospital encounter of 01/20/18 (from the past 24 hour(s))  POCT urinalysis dip (device)     Status: None   Collection Time: 01/20/18 10:53 AM  Result Value Ref Range   Glucose, UA NEGATIVE NEGATIVE mg/dL   Bilirubin Urine NEGATIVE NEGATIVE   Ketones, ur NEGATIVE NEGATIVE mg/dL   Specific Gravity, Urine 1.020 1.005 - 1.030   Hgb urine dipstick NEGATIVE NEGATIVE   pH 7.5 5.0 - 8.0   Protein, ur NEGATIVE NEGATIVE mg/dL   Urobilinogen, UA 1.0 0.0 - 1.0 mg/dL   Nitrite NEGATIVE NEGATIVE   Leukocytes, UA NEGATIVE NEGATIVE  Pregnancy, urine POC     Status: None   Collection Time: 01/20/18 10:57 AM  Result Value Ref Range   Preg Test, Ur NEGATIVE NEGATIVE    ASSESSMENT & PLAN:  1.  Abdominal discomfort, generalized     Meds ordered this encounter  Medications  . dicyclomine (BENTYL) 20 MG tablet    Sig: Take 1 tablet (20 mg total) by mouth 2 (two) times daily.    Dispense:  20 tablet    Refill:  0    Order Specific Question:   Supervising Provider    Answer:   Isa RankinMURRAY, LAURA WILSON [phone][[id number]]  . DISCONTD: ondansetron (ZOFRAN) 4 MG tablet    Sig: Take 1 tablet (4 mg total) by mouth every 6 (six) hours.    Dispense:  12 tablet    Refill:  0  Order Specific Question:   Supervising Provider    Answer:   Isa Rankin [[id number]]   Urine did not show signs of infection or other disease processes Urine pregnancy negative Please follow up with PCP in 1-2 weeks for repeat urine pregnancy  We will trial bentyl and zofran If symptoms persists please return, go to the ED, or follow up with PCP If you have any new or worsening symptoms go to the ED or [facility] for further evaluation and management.  Symptoms include worsening abdominal pain, nausea, vomiting, diarrhea, etc...  Reviewed expectations re: course of current medical issues. Questions answered. Outlined signs and symptoms indicating need for more acute intervention. Patient verbalized understanding. After Visit Summary given.   Rennis Harding, PA-C 01/20/18 1225

## 2018-01-20 NOTE — Discharge Instructions (Addendum)
Urine did not show signs of infection or other disease processes Urine pregnancy negative Please follow up with PCP in 1-2 weeks for repeat urine pregnancy  We will trial bentyl If symptoms persists please return, go to the ED, or follow up with PCP If you have any new or worsening symptoms go to the ED or [facility] for further evaluation and management.  Symptoms include worsening abdominal pain, nausea, vomiting, diarrhea, etc...Marland Kitchen

## 2018-01-20 NOTE — ED Triage Notes (Signed)
Pt states she called 911 yesterday and took an ambulance to the ER for severe abdominal pain, chest pain. Pt states she was put in the waiting room for "six hours and I couldn't wait due to the pain". Pt states the chest pain is gone, but her abdominal pain is an 8. Had lab work done yesterday which is normal. Pt is in NAD.

## 2018-01-21 LAB — URINE CULTURE: Culture: <10000 — AB

## 2018-02-18 ENCOUNTER — Ambulatory Visit: Payer: Self-pay | Admitting: Internal Medicine

## 2018-10-08 ENCOUNTER — Observation Stay (HOSPITAL_COMMUNITY)
Admission: EM | Admit: 2018-10-08 | Discharge: 2018-10-10 | Disposition: A | Discharge status: Other Acute Inpatient Hospital | Payer: Medicaid Other | Attending: Family Medicine | Admitting: Family Medicine

## 2018-10-08 DIAGNOSIS — T1491XA Suicide attempt, initial encounter: Secondary | ICD-10-CM | POA: Diagnosis present

## 2018-10-08 DIAGNOSIS — Z79899 Other long term (current) drug therapy: Secondary | ICD-10-CM | POA: Insufficient documentation

## 2018-10-08 DIAGNOSIS — E876 Hypokalemia: Secondary | ICD-10-CM

## 2018-10-08 DIAGNOSIS — Z1159 Encounter for screening for other viral diseases: Secondary | ICD-10-CM | POA: Insufficient documentation

## 2018-10-08 DIAGNOSIS — F419 Anxiety disorder, unspecified: Secondary | ICD-10-CM | POA: Insufficient documentation

## 2018-10-08 DIAGNOSIS — Z915 Personal history of self-harm: Secondary | ICD-10-CM | POA: Insufficient documentation

## 2018-10-08 DIAGNOSIS — T50902A Poisoning by unspecified drugs, medicaments and biological substances, intentional self-harm, initial encounter: Secondary | ICD-10-CM

## 2018-10-08 DIAGNOSIS — T391X2A Poisoning by 4-Aminophenol derivatives, intentional self-harm, initial encounter: Principal | ICD-10-CM | POA: Insufficient documentation

## 2018-10-08 DIAGNOSIS — J45909 Unspecified asthma, uncomplicated: Secondary | ICD-10-CM | POA: Insufficient documentation

## 2018-10-08 DIAGNOSIS — F17210 Nicotine dependence, cigarettes, uncomplicated: Secondary | ICD-10-CM | POA: Insufficient documentation

## 2018-10-08 DIAGNOSIS — Z8249 Family history of ischemic heart disease and other diseases of the circulatory system: Secondary | ICD-10-CM | POA: Insufficient documentation

## 2018-10-08 DIAGNOSIS — F329 Major depressive disorder, single episode, unspecified: Secondary | ICD-10-CM | POA: Insufficient documentation

## 2018-10-08 DIAGNOSIS — E282 Polycystic ovarian syndrome: Secondary | ICD-10-CM | POA: Insufficient documentation

## 2018-10-08 DIAGNOSIS — R45851 Suicidal ideations: Secondary | ICD-10-CM

## 2018-10-08 DIAGNOSIS — X58XXXA Exposure to other specified factors, initial encounter: Secondary | ICD-10-CM | POA: Insufficient documentation

## 2018-10-08 NOTE — ED Triage Notes (Addendum)
Per EMS, PT was picked up walking from her home (2/3 miles) after she called [facility] to report she was suicidal.  States she took and unknown amount of ibuprofen yesterday along w/ 4 "allergy pills" Today she reports she took a total of 11 steroid pills today.

## 2018-10-09 DIAGNOSIS — T50902A Poisoning by unspecified drugs, medicaments and biological substances, intentional self-harm, initial encounter: Secondary | ICD-10-CM

## 2018-10-09 DIAGNOSIS — E876 Hypokalemia: Secondary | ICD-10-CM

## 2018-10-09 DIAGNOSIS — T1491XA Suicide attempt, initial encounter: Secondary | ICD-10-CM

## 2018-10-09 LAB — BASIC METABOLIC PANEL
Anion gap: 16 — ABNORMAL HIGH (ref 5–15)
Anion gap: 11 (ref 5–15)
Anion gap: 9 (ref 5–15)
BUN: 8 mg/dL (ref 6–20)
BUN: 9 mg/dL (ref 6–20)
BUN: 10 mg/dL (ref 6–20)
CO2: 17 mmol/L — ABNORMAL LOW (ref 22–32)
CO2: 21 mmol/L — ABNORMAL LOW (ref 22–32)
CO2: 19 mmol/L — ABNORMAL LOW (ref 22–32)
Calcium: 10.9 mg/dL — ABNORMAL HIGH (ref 8.9–10.3)
Calcium: 10.4 mg/dL — ABNORMAL HIGH (ref 8.9–10.3)
Calcium: 10.5 mg/dL — ABNORMAL HIGH (ref 8.9–10.3)
Chloride: 107 mmol/L (ref 98–111)
Chloride: 105 mmol/L (ref 98–111)
Chloride: 109 mmol/L (ref 98–111)
Creatinine, Ser: 0.93 mg/dL (ref 0.44–1.00)
Creatinine, Ser: 0.88 mg/dL (ref 0.44–1.00)
Creatinine, Ser: 0.7 mg/dL (ref 0.44–1.00)
GFR calc Af Amer: >60 mL/min (ref >60)
GFR calc Af Amer: >60 mL/min (ref >60)
GFR calc Af Amer: >60 mL/min (ref >60)
GFR calc non Af Amer: >60 mL/min (ref >60)
GFR calc non Af Amer: >60 mL/min (ref >60)
GFR calc non Af Amer: >60 mL/min (ref >60)
Glucose, Bld: 135 mg/dL — ABNORMAL HIGH (ref 70–99)
Glucose, Bld: 78 mg/dL (ref 70–99)
Glucose, Bld: 73 mg/dL (ref 70–99)
Potassium: 3 mmol/L — ABNORMAL LOW (ref 3.5–5.1)
Potassium: 4.7 mmol/L (ref 3.5–5.1)
Potassium: 4.6 mmol/L (ref 3.5–5.1)
Sodium: 140 mmol/L (ref 135–145)
Sodium: 137 mmol/L (ref 135–145)
Sodium: 137 mmol/L (ref 135–145)

## 2018-10-09 LAB — ACETAMINOPHEN LEVEL
Acetaminophen (Tylenol), Serum: 23 ug/mL (ref 10–30)
Acetaminophen (Tylenol), Serum: 44 ug/mL — ABNORMAL HIGH (ref 10–30)

## 2018-10-09 LAB — HEPATIC FUNCTION PANEL
ALT: 21 U/L (ref 0–44)
AST: 20 U/L (ref 15–41)
Albumin: 4.7 g/dL (ref 3.5–5.0)
Alkaline Phosphatase: 60 U/L (ref 38–126)
Bilirubin, Direct: <0.1 mg/dL (ref 0.0–0.2)
Total Bilirubin: 0.8 mg/dL (ref 0.3–1.2)
Total Protein: 8.3 g/dL — ABNORMAL HIGH (ref 6.5–8.1)

## 2018-10-09 LAB — ETHANOL: Alcohol, Ethyl (B): <10 mg/dL (ref <10)

## 2018-10-09 LAB — RAPID URINE DRUG SCREEN, HOSP PERFORMED
Amphetamines: NOT DETECTED
Barbiturates: POSITIVE — AB
Benzodiazepines: NOT DETECTED
Cocaine: NOT DETECTED
Opiates: NOT DETECTED
Tetrahydrocannabinol: POSITIVE — AB

## 2018-10-09 LAB — COMPREHENSIVE METABOLIC PANEL
ALT: 22 U/L (ref 0–44)
AST: 22 U/L (ref 15–41)
Albumin: 4.9 g/dL (ref 3.5–5.0)
Alkaline Phosphatase: 57 U/L (ref 38–126)
Anion gap: 11 (ref 5–15)
BUN: 7 mg/dL (ref 6–20)
CO2: 21 mmol/L — ABNORMAL LOW (ref 22–32)
Calcium: 11 mg/dL — ABNORMAL HIGH (ref 8.9–10.3)
Chloride: 106 mmol/L (ref 98–111)
Creatinine, Ser: 1.05 mg/dL — ABNORMAL HIGH (ref 0.44–1.00)
GFR calc Af Amer: >60 mL/min (ref >60)
GFR calc non Af Amer: >60 mL/min (ref >60)
Glucose, Bld: 96 mg/dL (ref 70–99)
Potassium: 3.3 mmol/L — ABNORMAL LOW (ref 3.5–5.1)
Sodium: 138 mmol/L (ref 135–145)
Total Bilirubin: 0.6 mg/dL (ref 0.3–1.2)
Total Protein: 8.2 g/dL — ABNORMAL HIGH (ref 6.5–8.1)

## 2018-10-09 LAB — MAGNESIUM: Magnesium: 2.1 mg/dL (ref 1.7–2.4)

## 2018-10-09 LAB — CBC
HCT: 44 % (ref 36.0–46.0)
Hemoglobin: 14.5 g/dL (ref 12.0–15.0)
MCH: 31.8 pg (ref 26.0–34.0)
MCHC: 33 g/dL (ref 30.0–36.0)
MCV: 96.5 fL (ref 80.0–100.0)
Platelets: 247 10*3/uL (ref 150–400)
RBC: 4.56 MIL/uL (ref 3.87–5.11)
RDW: 13.3 % (ref 11.5–15.5)
WBC: 6.8 10*3/uL (ref 4.0–10.5)
nRBC: 0 % (ref 0.0–0.2)

## 2018-10-09 LAB — PROTIME-INR
INR: 1.1 (ref 0.8–1.2)
Prothrombin Time: 14.5 seconds (ref 11.4–15.2)

## 2018-10-09 LAB — SALICYLATE LEVEL: Salicylate Lvl: <7 mg/dL (ref 2.8–30.0)

## 2018-10-09 LAB — I-STAT BETA HCG BLOOD, ED (MC, WL, AP ONLY): I-stat hCG, quantitative: <5 m[IU]/mL (ref <5)

## 2018-10-09 LAB — HIV ANTIBODY (ROUTINE TESTING W REFLEX): HIV Screen 4th Generation wRfx: NONREACTIVE

## 2018-10-09 LAB — SARS CORONAVIRUS 2 BY RT PCR (HOSPITAL ORDER, PERFORMED IN ~~LOC~~ HOSPITAL LAB): SARS Coronavirus 2: NEGATIVE

## 2018-10-09 MED ORDER — POTASSIUM CHLORIDE CRYS ER 20 MEQ PO TBCR
40.0000 meq | EXTENDED_RELEASE_TABLET | Freq: Once | ORAL | Status: AC
  Administered 2018-10-09: 40 meq via ORAL
  Filled 2018-10-09: qty 2

## 2018-10-09 MED ORDER — ENOXAPARIN SODIUM 40 MG/0.4ML ~~LOC~~ SOLN
40.0000 mg | Freq: Every day | SUBCUTANEOUS | Status: DC
  Filled 2018-10-09: qty 0.4

## 2018-10-09 MED ORDER — NICOTINE 21 MG/24HR TD PT24
21.0000 mg | MEDICATED_PATCH | Freq: Every day | TRANSDERMAL | Status: DC
  Administered 2018-10-09: 21 mg via TRANSDERMAL
  Filled 2018-10-09: qty 1

## 2018-10-09 MED ORDER — TRAZODONE HCL 50 MG PO TABS
50.0000 mg | ORAL_TABLET | Freq: Every day | ORAL | Status: DC
  Filled 2018-10-09: qty 1

## 2018-10-09 MED ORDER — ZIPRASIDONE MESYLATE 20 MG IM SOLR
20.0000 mg | Freq: Two times a day (BID) | INTRAMUSCULAR | Status: DC | PRN
  Administered 2018-10-09: 20 mg via INTRAMUSCULAR
  Filled 2018-10-09 (×2): qty 20

## 2018-10-09 MED ORDER — LORAZEPAM 2 MG/ML IJ SOLN
1.0000 mg | Freq: Two times a day (BID) | INTRAMUSCULAR | Status: DC | PRN
  Administered 2018-10-09: 1 mg via INTRAMUSCULAR
  Filled 2018-10-09: qty 1

## 2018-10-09 MED ORDER — METOCLOPRAMIDE HCL 10 MG PO TABS
10.0000 mg | ORAL_TABLET | Freq: Once | ORAL | Status: AC
  Administered 2018-10-09: 10 mg via ORAL
  Filled 2018-10-09: qty 1

## 2018-10-09 MED ORDER — LAMOTRIGINE 25 MG PO TABS: 25.0000 mg | ORAL_TABLET | Freq: Every day | ORAL | Status: DC

## 2018-10-09 MED ORDER — DIPHENHYDRAMINE HCL 50 MG/ML IJ SOLN
50.0000 mg | Freq: Two times a day (BID) | INTRAMUSCULAR | Status: DC | PRN
  Administered 2018-10-09: 50 mg via INTRAMUSCULAR
  Filled 2018-10-09: qty 1

## 2018-10-09 MED ORDER — ACETYLCYSTEINE 20 % IN SOLN
140.0000 mg/kg | Freq: Once | RESPIRATORY_TRACT | Status: AC
  Administered 2018-10-09: 11180 mg via ORAL
  Filled 2018-10-09 (×2): qty 60

## 2018-10-09 MED ORDER — ACETAMINOPHEN 325 MG PO TABS: 650.0000 mg | ORAL_TABLET | ORAL | Status: DC | PRN

## 2018-10-09 MED ORDER — ACETYLCYSTEINE 20 % IN SOLN
70.0000 mg/kg | RESPIRATORY_TRACT | Status: DC
  Administered 2018-10-09: 5580 mg via ORAL
  Filled 2018-10-09 (×9): qty 30

## 2018-10-09 NOTE — ED Notes (Signed)
Pt is verbally aggressive stating, "I'm about to snap and can't nothing stop me."

## 2018-10-09 NOTE — ED Provider Notes (Addendum)
[facility] EMERGENCY DEPARTMENT Provider Note   CSN: [id number] Arrival date & time: 10/08/18  2343    History   Chief Complaint Chief Complaint  Patient presents with  . Suicidal    HPI Jenna Henry is a 23 y.o. female.     Patient to ED after ingestion of multiple substances over the last 48 hours with suicidal intent. Yesterday, she reports taking "a bottle" of ibuprofen. Today, she states she took a total of 11 steroid pills of unknown strength, taken throughout the day, along with 4 allergy type pills. She complains of having a headache currently. No nausea, vomiting, chest pain, SOB. She reports a history of suicidal ideation with attempt, "this will make my 8th time".   The history is provided by the patient. No language interpreter was used.    Past Medical History:  Diagnosis Date  . Anxiety   . Asthma   . Depression   . Herpes   . Polycystic ovaries     Patient Active Problem List   Diagnosis Date Noted  . 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 Palpitations 11/09/2017  . Chest pain 11/09/2017  . Cigarette smoker 11/09/2017  . Severe major depression without psychotic features (HCC) 02/28/2017  . 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 Suicide attempt (HCC) 02/28/2017  . Anxiety state 02/28/2017  . Insomnia 02/28/2017  . Herpes 11/12/2016  . Irregular menstruation 07/06/2016    Past Surgical History:  Procedure Laterality Date  . CYSTECTOMY       OB History   No obstetric history on file.      Home Medications    Prior to Admission medications   Medication Sig Start Date End Date Taking? Authorizing Provider  dicyclomine (BENTYL) 20 MG tablet Take 1 tablet (20 mg total) by mouth 2 (two) times daily. 01/20/18   Wurst, [place], PA-C  docusate sodium (COLACE) 100 MG capsule Take 1 capsule (100 mg total) by mouth every 12 (twelve) hours. 12/12/17   Ofilia Neas, PA-C   omeprazole (PRILOSEC) 20 MG capsule Take 1 capsule (20 mg total) by mouth daily. 01/10/18   Georgetta Haber, NP  ondansetron (ZOFRAN-ODT) 4 MG disintegrating tablet Take 1 tablet (4 mg total) by mouth every 8 (eight) hours as needed for nausea or vomiting. 01/10/18   Linus Mako B, NP  polyethylene glycol (MIRALAX) packet Take 17 g by mouth 2 (two) times daily. Take 2-3 times per day for mild to moderate constipation. 12/12/17   Ofilia Neas, PA-C  ranitidine (ZANTAC) 150 MG capsule Take 1 capsule (150 mg total) by mouth 2 (two) times daily. 12/05/17   Tilden Fossa, MD  senna-docusate (SENOKOT-S) 8.6-50 MG tablet Take 1 tablet by mouth daily. Start tomorrow, if needed, after using stol softeners. 12/12/17   Ofilia Neas, PA-C  valACYclovir (VALTREX) 500 MG tablet Take 1 tablet (500 mg total) by mouth daily. 11/28/17   Marcine Matar, MD    Family History Family History  Problem Relation Age of Onset  . Hypertension Mother   . Heart murmur Father   . Heart disease Maternal Grandfather   . Diabetes Paternal Grandmother   . Cancer Paternal Grandmother     Social History Social History   Tobacco Use  . Smoking status: Current Some Day Smoker    Packs/day: 1.00  . Smokeless tobacco: Never Used  . Tobacco comment: pt reports smokeing 1 Black and mild daily  Substance Use Topics  .  Alcohol use: No  . Drug use: Yes    Types: Marijuana     Allergies   Ortho tri-cyclen [norgestimate-eth estradiol]   Review of Systems Review of Systems  Constitutional: Negative for chills and fever.  HENT: Negative.   Respiratory: Negative.   Cardiovascular: Negative.   Gastrointestinal: Negative.  Negative for vomiting.  Musculoskeletal: Negative.   Skin: Negative.   Neurological: Positive for headaches.  Psychiatric/Behavioral: Positive for dysphoric mood, self-injury and suicidal ideas.     Physical Exam Updated Vital Signs There were no vitals taken for this visit.   Physical Exam Constitutional:      Appearance: She is well-developed.  HENT:     Head: Normocephalic.  Neck:     Musculoskeletal: Normal range of motion and neck supple.  Cardiovascular:     Rate and Rhythm: Normal rate and regular rhythm.  Pulmonary:     Effort: Pulmonary effort is normal.     Breath sounds: Normal breath sounds.  Abdominal:     General: Bowel sounds are normal.     Palpations: Abdomen is soft.     Tenderness: There is no abdominal tenderness. There is no guarding or rebound.  Musculoskeletal: Normal range of motion.  Skin:    General: Skin is warm and dry.     Findings: No rash.  Neurological:     General: No focal deficit present.     Mental Status: She is alert and oriented to person, place, and time.  Psychiatric:        Attention and Perception: She does not perceive auditory or visual hallucinations.        Speech: Speech normal.        Thought Content: Thought content is not delusional. Thought content includes suicidal ideation. Thought content includes suicidal plan.      ED Treatments / Results  Labs (all labs ordered are listed, but only abnormal results are displayed) Labs Reviewed  COMPREHENSIVE METABOLIC PANEL  ETHANOL  SALICYLATE LEVEL  ACETAMINOPHEN LEVEL  CBC  RAPID URINE DRUG SCREEN, HOSP PERFORMED  I-STAT BETA HCG BLOOD, ED ([facility], [facility], [facility] ONLY)   Results for orders placed or performed during the hospital encounter of 10/08/18  Comprehensive metabolic panel  Result Value Ref Range   Sodium 138 135 - 145 mmol/L   Potassium 3.3 (L) 3.5 - 5.1 mmol/L   Chloride 106 98 - 111 mmol/L   CO2 21 (L) 22 - 32 mmol/L   Glucose, Bld 96 70 - 99 mg/dL   BUN 7 6 - 20 mg/dL   Creatinine, Ser 1.61 (H) 0.44 - 1.00 mg/dL   Calcium 09.6 (H) 8.9 - 10.3 mg/dL   Total Protein 8.2 (H) 6.5 - 8.1 g/dL   Albumin 4.9 3.5 - 5.0 g/dL   AST 22 15 - 41 U/L   ALT 22 0 - 44 U/L   Alkaline Phosphatase 57 38 - 126 U/L   Total Bilirubin 0.6 0.3 - 1.2 mg/dL   GFR  calc non Af Amer >60 >60 mL/min   GFR calc Af Amer >60 >60 mL/min   Anion gap 11 5 - 15  Ethanol  Result Value Ref Range   Alcohol, Ethyl (B) <10 <10 mg/dL  Salicylate level  Result Value Ref Range   Salicylate Lvl <7.0 2.8 - 30.0 mg/dL  Acetaminophen level  Result Value Ref Range   Acetaminophen (Tylenol), Serum 44 (H) 10 - 30 ug/mL  cbc  Result Value Ref Range   WBC 6.8 4.0 -  10.5 K/uL   RBC 4.56 3.87 - 5.11 MIL/uL   Hemoglobin 14.5 12.0 - 15.0 g/dL   HCT 98.9 21.1 - 94.1 %   MCV 96.5 80.0 - 100.0 fL   MCH 31.8 26.0 - 34.0 pg   MCHC 33.0 30.0 - 36.0 g/dL   RDW 74.0 81.4 - 48.1 %   Platelets 247 150 - 400 K/uL   nRBC 0.0 0.0 - 0.2 %  Rapid urine drug screen (hospital performed)  Result Value Ref Range   Opiates NONE DETECTED NONE DETECTED   Cocaine NONE DETECTED NONE DETECTED   Benzodiazepines NONE DETECTED NONE DETECTED   Amphetamines NONE DETECTED NONE DETECTED   Tetrahydrocannabinol POSITIVE (A) NONE DETECTED   Barbiturates POSITIVE (A) NONE DETECTED  I-Stat beta hCG blood, ED  Result Value Ref Range   I-stat hCG, quantitative <5.0 <5 mIU/mL   Comment 3            EKG None  Radiology No results found.  Procedures Procedures (including critical care time) CRITICAL CARE Performed by: Arnoldo Hooker   Total critical care time: 45 minutes  Critical care time was exclusive of separately billable procedures and treating other patients.  Critical care was necessary to treat or prevent imminent or life-threatening deterioration.  Critical care was time spent personally by me on the following activities: development of treatment plan with patient and/or surrogate as well as nursing, discussions with consultants, evaluation of patient's response to treatment, examination of patient, obtaining history from patient or surrogate, ordering and performing treatments and interventions, ordering and review of laboratory studies, ordering and review of radiographic studies,  pulse oximetry and re-evaluation of patient's condition.  Medications Ordered in ED Medications - No data to display   Initial Impression / Assessment and Plan / ED Course  I have reviewed the triage vital signs and the nursing notes.  Pertinent labs & imaging results that were available during my care of the patient were reviewed by me and considered in my medical decision making (see chart for details).        Patient to ED with suicidal ideation, ingestion of multiple drugs over the last 48 hours. History of SI with attempt.   The patient reports taking a large quantity of ibuprofen yesterday. No AKI, nausea, vomiting, blurred vision.   She reports taking an unknown mg dosage of steroids throughout today, and 4 allergy pills of unknown type. VSS. She reports mild epigastric discomfort but denies vomiting. No evidence GI bleeding, anticholinergic effects. Per Poison Control, monitor for 4-6 hours after ingestion of steroids. Ingestion occurred through the day. Unlikely to cause significant harm.   Tylenol level is elevated at 44. Per Poison Control, recommendation is to start Mucomyst treatment - can do oral administration. Per patient on re-questioning, she denies having taken Tylenol. She reiterates that yesterday 10/08/18, she took only steroids, and on 10/07/18 she took only ibuprofen and 4 allergy tablets. Allergy tablets were OTC from [employer]. She does not know what kind. As there is no known quantity or time of ingestion, she will require treatment for acetaminophen toxicity and admission to the hospital.   She was updated on need for admission and treatment for Tylenol ingestion. She has said she does not want to be admitted. IVC Petition was filed. She is increasingly agitated/angry about being admitted. PO Ativan ordered.   Discussed admission with [facility]. COVID testing ordered per admission guidelines.     Final Clinical Impressions(s) / ED Diagnoses  Final diagnoses:   None   1. Suicidal ideation 2. Tylenol ingestion  ED Discharge Orders    None       Elpidio AnisUpstill, Jimmye Wisnieski, PA-C 10/09/18 0425    Elpidio AnisUpstill, Karla Vines, PA-C 10/09/18 [id number]    Zadie RhineWickline, Donald, MD 10/09/18 [id number]

## 2018-10-09 NOTE — ED Notes (Signed)
In to explain to patient that we will need to start and IV, check vitals and obtain Covid testing for admission. Pt says she is not going to have an IV. I explained again the necessity. She is inquiring about making phone calls to her mother. I explained department procedures, that she can make phone calls in the morning, I was unsure of procedures when she arrives to admitting floor but that would be discussed when she got there. I left to obtain supplies. On return, the pt says that we are "going to have to fight" her to be able to stay. Notified provider of the same. Pt agreeable to vitals at this time. Sitter remains at the bedside.

## 2018-10-09 NOTE — ED Notes (Signed)
Pt refused blood draw, I will notify nurse.

## 2018-10-09 NOTE — Progress Notes (Signed)
Security called due to patient throwing a cup, the bedside table and punching the window. Psych and Dr. Maryfrances Bunnell are aware of the patients escalating behavior. Spoke with mother who has asked to come see the patient but the [facility] said that is not possible.

## 2018-10-09 NOTE — H&P (Signed)
History and Physical    Jenna Henry UJW:[id number] DOB: 1995/08/05 DOA: 10/08/2018  PCP: Marcine Matar, MD Patient coming from: Home  Chief Complaint: Suicide attempt  HPI: Jenna Henry is a 22 y.o. female with medical history significant of anxiety, depression asthma, and conditions listed below presenting to the hospital via EMS after suicide attempt.  Per EMS report, patient was picked up walking from her home after she called [facility] to report that she was suicidal.  Patient states she was walking outside with a knife in her hand with intention to cut her wrist to end her life.  She called EMS.  States she took half a bottle of ibuprofen pills on 5/8 along with 4 allergy pills from the dollar store.  On 5/9 she took 10 tablets of a steroid pill throughout the day.  She does not know the name of the steroid.  States her intention was to end her life.  Per patient, this is her eighth suicide attempt.  States she felt a nauseous earlier but did not vomit.  Reports history of chronic lower abdominal pain secondary to PCOS, no recent change.  States she has not taken any medication for depression since June 2017.  She currently does not see a psychiatrist as she is homeless and does not have insurance.  No additional history could be obtained from the patient as she appeared angry and agitated.  She threatened to leave the hospital.  She refused IV line.   ED provider informed me that patient has been involuntarily committed as she is threatening to leave.  Review of Systems: As per HPI otherwise 10 point review of systems negative.  Past Medical History:  Diagnosis Date  . Anxiety   . Asthma   . Depression   . Herpes   . Polycystic ovaries     Past Surgical History:  Procedure Laterality Date  . CYSTECTOMY       reports that she has been smoking. She has been smoking about 1.00 pack per day. She has never used smokeless tobacco. She reports current drug use. Drug: Marijuana. She reports  that she does not drink alcohol.  Allergies  Allergen Reactions  . Ortho Tri-Cyclen [Norgestimate-Eth Estradiol] Hives, Itching, Rash and Other (See Comments)    "Burning all over"    Family History  Problem Relation Age of Onset  . Hypertension Mother   . Heart murmur Father   . Heart disease Maternal Grandfather   . Diabetes Paternal Grandmother   . Cancer Paternal Grandmother     Prior to Admission medications   Medication Sig Start Date End Date Taking? Authorizing Provider  dicyclomine (BENTYL) 20 MG tablet Take 1 tablet (20 mg total) by mouth 2 (two) times daily. 01/20/18   Wurst, [place], PA-C  docusate sodium (COLACE) 100 MG capsule Take 1 capsule (100 mg total) by mouth every 12 (twelve) hours. 12/12/17   Ofilia Neas, PA-C  omeprazole (PRILOSEC) 20 MG capsule Take 1 capsule (20 mg total) by mouth daily. 01/10/18   Georgetta Haber, NP  ondansetron (ZOFRAN-ODT) 4 MG disintegrating tablet Take 1 tablet (4 mg total) by mouth every 8 (eight) hours as needed for nausea or vomiting. 01/10/18   Linus Mako B, NP  polyethylene glycol (MIRALAX) packet Take 17 g by mouth 2 (two) times daily. Take 2-3 times per day for mild to moderate constipation. 12/12/17   Ofilia Neas, PA-C  ranitidine (ZANTAC) 150 MG capsule Take 1 capsule (150 mg total) by  mouth 2 (two) times daily. 12/05/17   Tilden Fossaees, Elizabeth, MD  senna-docusate (SENOKOT-S) 8.6-50 MG tablet Take 1 tablet by mouth daily. Start tomorrow, if needed, after using stol softeners. 12/12/17   Ofilia Neaslark, Michael L, PA-C  valACYclovir (VALTREX) 500 MG tablet Take 1 tablet (500 mg total) by mouth daily. 11/28/17   Marcine MatarJohnson, Deborah B, MD    Physical Exam: Vitals:   10/09/18 0023 10/09/18 0201 10/09/18 0300 10/09/18 0417  BP:   118/90 114/82  Pulse:   61 71  Resp: 16  16 18   Temp: 98.3 F (36.8 C)  (!) 97.4 F (36.3 C) 98.4 F (36.9 C)  TempSrc: Oral  Oral Oral  SpO2:   100% 100%  Weight:  79.8 kg      Physical Exam   Constitutional: She is oriented to person, place, and time. She appears well-developed and well-nourished. No distress.  Sitting up comfortably in a hospital stretcher  HENT:  Head: Normocephalic.  Mouth/Throat: Oropharynx is clear and moist.  Eyes: Right eye exhibits no discharge. Left eye exhibits no discharge.  Neck: Neck supple.  Cardiovascular: Normal rate, regular rhythm and intact distal pulses.  Pulmonary/Chest: Effort normal and breath sounds normal. No respiratory distress. She has no wheezes. She has no rales.  Abdominal: Soft. Bowel sounds are normal. She exhibits no distension. There is no abdominal tenderness. There is no rebound and no guarding.  Musculoskeletal:        General: No edema.  Neurological: She is alert and oriented to person, place, and time.  Skin: Skin is warm and dry. She is not diaphoretic.  Superficial abrasions noted on the right wrist/forearm.  No bleeding.  Psychiatric:  Angry, agitated     Labs on Admission: I have personally reviewed following labs and imaging studies  CBC: Recent Labs  Lab 10/09/18 0012  WBC 6.8  HGB 14.5  HCT 44.0  MCV 96.5  PLT 247   Basic Metabolic Panel: Recent Labs  Lab 10/09/18 0012  NA 138  K 3.3*  CL 106  CO2 21*  GLUCOSE 96  BUN 7  CREATININE 1.05*  CALCIUM 11.0*   GFR: CrCl cannot be calculated (Unknown ideal weight.). Liver Function Tests: Recent Labs  Lab 10/09/18 0012  AST 22  ALT 22  ALKPHOS 57  BILITOT 0.6  PROT 8.2*  ALBUMIN 4.9   No results for input(s): LIPASE, AMYLASE in the last 168 hours. No results for input(s): AMMONIA in the last 168 hours. Coagulation Profile: No results for input(s): INR, PROTIME in the last 168 hours. Cardiac Enzymes: No results for input(s): CKTOTAL, CKMB, CKMBINDEX, TROPONINI in the last 168 hours. BNP (last 3 results) No results for input(s): PROBNP in the last 8760 hours. HbA1C: No results for input(s): HGBA1C in the last 72 hours. CBG: No  results for input(s): GLUCAP in the last 168 hours. Lipid Profile: No results for input(s): CHOL, HDL, LDLCALC, TRIG, CHOLHDL, LDLDIRECT in the last 72 hours. Thyroid Function Tests: No results for input(s): TSH, T4TOTAL, FREET4, T3FREE, THYROIDAB in the last 72 hours. Anemia Panel: No results for input(s): VITAMINB12, FOLATE, FERRITIN, TIBC, IRON, RETICCTPCT in the last 72 hours. Urine analysis:    Component Value Date/Time   COLORURINE YELLOW 12/19/2017 0607   APPEARANCEUR HAZY (A) 12/19/2017 0607   LABSPEC 1.020 01/20/2018 1053   PHURINE 7.5 01/20/2018 1053   GLUCOSEU NEGATIVE 01/20/2018 1053   HGBUR NEGATIVE 01/20/2018 1053   BILIRUBINUR NEGATIVE 01/20/2018 1053   BILIRUBINUR neg 02/11/2017 0913  KETONESUR NEGATIVE 01/20/2018 1053   PROTEINUR NEGATIVE 01/20/2018 1053   UROBILINOGEN 1.0 01/20/2018 1053   NITRITE NEGATIVE 01/20/2018 1053   LEUKOCYTESUR NEGATIVE 01/20/2018 1053    Radiological Exams on Admission: No results found.  EKG: Independently reviewed.  Sinus rhythm.  No QT prolongation.  Assessment/Plan Principal Problem:   Suicide attempt [facility]) Active Problems:   Intentional drug overdose (HCC)   Hypokalemia  Suicide attempt, intentional drug overdose Patient took an unknown dosage of steroids throughout the day on 5/9 and unknown dosage of ibuprofen along with allergy tablets of unknown type on 5/8.  Afebrile and hemodynamically stable.  AAO x4, no signs of distress.  No leukocytosis. Bicarb 21, anion gap 11.  LFTs normal.  BUN 7 and creatinine 1.0.  UDS positive for THC and barbiturates.  Blood ethanol level negative.  Salicylate level negative.  Acetaminophen level 44.  Poison control recommended monitoring for 4-6 hours after ingestion of steroids.  Also recommended starting oral Mucomyst.  Patient is refusing IV line placement. -Cardiac monitoring -Mucomyst oral loading dose 140 mg/kg and then maintenance dose 70 mg/kg every 4 hours -Check BMP every 6 hours  to monitor for metabolic acidosis -Monitor LFTs, acetaminophen level, INR -Monitor for changes in level of consciousness/ mental status -Suicide precautions, sitter at bedside -Psych consult  Hypokalemia Potassium 3.3. -Replete potassium.  Check magnesium level.  Continue to monitor BMP.  DVT prophylaxis: Lovenox Code Status: Full code Family Communication: No family available. Disposition Plan: Anticipate discharge after clinical improvement. Consults called: Psychiatry Admission status: Observation, telemetry  This chart was dictated using voice recognition software.  Despite best efforts to proofread, errors can occur which can change the documentation meaning.  John Giovanni MD [facility] Pager [phone]  If 7PM-7AM, please contact night-coverage [email or web address] Password [facility]  10/09/2018, 4:39 AM

## 2018-10-09 NOTE — Consult Note (Signed)
[facility] Face-to-Face Psychiatry Consult   Reason for Consult: suicide attempt Referring Physician:  Dr. Maryfrances Bunnell Patient Identification: Jenna Henry MRN:  [id number] Principal Diagnosis: Suicide attempt [facility]) Diagnosis:  Principal Problem:   Suicide attempt [facility]) Active Problems:   Intentional drug overdose (HCC)   Hypokalemia   Total Time spent with patient: 45 minutes  Subjective:   Jenna Henry is a 23 y.o. female patient admitted after she attempted suicide by overdose.  HPI:  Patient  is a 23 y.o. female who reports history of mood disorder, Anxiety, depression asthma but non-compliant with her medications. She was admitted to the hospital after she attempted suicide over a period of 2 days by intentionally overdosed on half a bottle of Ibuprofen, 4 tablets of an allergy pills and 10 tablets of steroid pill. Patient also reports that before she was brought to the hospital she was found with a knife with the intention to cut her wrist. Patient reports that she had previously attempted suicide 7 times, first attempt was in 2016. She reports having anger problem, getting easily agitated and flips out about small stuffs. Patient states that she had an argument with her mother and her fiance prior to this current suicide attempt. Patient denies psychosis, delusions and unable to contract for safety. Her urine toxicology on admission was positive for Barbiturate and THC.  Past Psychiatric History: as above  Risk to Self:  yes Risk to Others:  denies Prior Inpatient Therapy:  [facility] in 2018 Prior Outpatient Therapy:  none  Past Medical History:  Past Medical History:  Diagnosis Date  . Anxiety   . Asthma   . Depression   . Herpes   . Polycystic ovaries     Past Surgical History:  Procedure Laterality Date  . CYSTECTOMY     Family History:  Family History  Problem Relation Age of Onset  . Hypertension Mother   . Heart murmur Father   . Heart disease Maternal Grandfather   . Diabetes  Paternal Grandmother   . Cancer Paternal Grandmother    Family Psychiatric  History:  Social History:  Social History   Substance and Sexual Activity  Alcohol Use No     Social History   Substance and Sexual Activity  Drug Use Yes  . Types: Marijuana    Social History   Socioeconomic History  . Marital status: Single    Spouse name: Not on file  . Number of children: Not on file  . Years of education: Not on file  . Highest education level: Not on file  Occupational History  . Not on file  Social Needs  . Financial resource strain: Not on file  . Food insecurity:    Worry: Not on file    Inability: Not on file  . Transportation needs:    Medical: Not on file    Non-medical: Not on file  Tobacco Use  . Smoking status: Current Some Day Smoker    Packs/day: 1.00  . Smokeless tobacco: Never Used  . Tobacco comment: pt reports smokeing 1 Black and mild daily  Substance and Sexual Activity  . Alcohol use: No  . Drug use: Yes    Types: Marijuana  . Sexual activity: Yes  Lifestyle  . Physical activity:    Days per week: Not on file    Minutes per session: Not on file  . Stress: Not on file  Relationships  . Social connections:    Talks on phone: Not on file  Gets together: Not on file    Attends religious service: Not on file    Active member of club or organization: Not on file    Attends meetings of clubs or organizations: Not on file    Relationship status: Not on file  Other Topics Concern  . Not on file  Social History Narrative  . Not on file   Additional Social History:    Allergies:   Allergies  Allergen Reactions  . Ortho Tri-Cyclen [Norgestimate-Eth Estradiol] Hives, Itching, Rash and Other (See Comments)    "Burning all over"    Labs:  Results for orders placed or performed during the hospital encounter of 10/08/18 (from the past 48 hour(s))  Rapid urine drug screen (hospital performed)     Status: Abnormal   Collection Time: 10/09/18  12:00 AM  Result Value Ref Range   Opiates NONE DETECTED NONE DETECTED   Cocaine NONE DETECTED NONE DETECTED   Benzodiazepines NONE DETECTED NONE DETECTED   Amphetamines NONE DETECTED NONE DETECTED   Tetrahydrocannabinol POSITIVE (A) NONE DETECTED   Barbiturates POSITIVE (A) NONE DETECTED    Comment: (NOTE) DRUG SCREEN FOR MEDICAL PURPOSES ONLY.  IF CONFIRMATION IS NEEDED FOR ANY PURPOSE, NOTIFY LAB WITHIN 5 DAYS. LOWEST DETECTABLE LIMITS FOR URINE DRUG SCREEN Drug Class                     Cutoff (ng/mL) Amphetamine and metabolites    1000 Barbiturate and metabolites    200 Benzodiazepine                 200 Tricyclics and metabolites     300 Opiates and metabolites        300 Cocaine and metabolites        300 THC                            50 Performed at [facility] Lab, [address] [place], [place] [address]   Comprehensive metabolic panel     Status: Abnormal   Collection Time: 10/09/18 12:12 AM  Result Value Ref Range   Sodium 138 135 - 145 mmol/L   Potassium 3.3 (L) 3.5 - 5.1 mmol/L   Chloride 106 98 - 111 mmol/L   CO2 21 (L) 22 - 32 mmol/L   Glucose, Bld 96 70 - 99 mg/dL   BUN 7 6 - 20 mg/dL   Creatinine, Ser 6.04 (H) 0.44 - 1.00 mg/dL   Calcium 54.0 (H) 8.9 - 10.3 mg/dL   Total Protein 8.2 (H) 6.5 - 8.1 g/dL   Albumin 4.9 3.5 - 5.0 g/dL   AST 22 15 - 41 U/L   ALT 22 0 - 44 U/L   Alkaline Phosphatase 57 38 - 126 U/L   Total Bilirubin 0.6 0.3 - 1.2 mg/dL   GFR calc non Af Amer >60 >60 mL/min   GFR calc Af Amer >60 >60 mL/min   Anion gap 11 5 - 15    Comment: Performed at [facility] Lab, [address], [place], [place] [address]  cbc     Status: None   Collection Time: 10/09/18 12:12 AM  Result Value Ref Range   WBC 6.8 4.0 - 10.5 K/uL   RBC 4.56 3.87 - 5.11 MIL/uL   Hemoglobin 14.5 12.0 - 15.0 g/dL   HCT 14.7 82.9 - 56.2 %   MCV 96.5 80.0 - 100.0 fL   MCH 31.8  26.0 - 34.0 pg   MCHC 33.0 30.0 - 36.0 g/dL   RDW 16.1 09.6 - 04.5 %   Platelets  247 150 - 400 K/uL   nRBC 0.0 0.0 - 0.2 %    Comment: Performed at [facility] Lab, [address], [place], [place] [address]  Ethanol     Status: None   Collection Time: 10/09/18 12:13 AM  Result Value Ref Range   Alcohol, Ethyl (B) <10 <10 mg/dL    Comment: (NOTE) Lowest detectable limit for serum alcohol is 10 mg/dL. For medical purposes only. Performed at [facility] Lab, [address], [place], [place] [address]   Salicylate level     Status: None   Collection Time: 10/09/18 12:13 AM  Result Value Ref Range   Salicylate Lvl <7.0 2.8 - 30.0 mg/dL    Comment: Performed at [facility] Lab, [address], [place], [place] [address]  Acetaminophen level     Status: Abnormal   Collection Time: 10/09/18 12:13 AM  Result Value Ref Range   Acetaminophen (Tylenol), Serum 44 (H) 10 - 30 ug/mL    Comment: (NOTE) Therapeutic concentrations vary significantly. A range of 10-30 ug/mL  may be an effective concentration for many patients. However, some  are best treated at concentrations outside of this range. Acetaminophen concentrations >150 ug/mL at 4 hours after ingestion  and >50 ug/mL at 12 hours after ingestion are often associated with  toxic reactions. Performed at [facility] Lab, [address] [place], [place] [address]   I-Stat beta hCG blood, ED     Status: None   Collection Time: 10/09/18 12:20 AM  Result Value Ref Range   I-stat hCG, quantitative <5.0 <5 mIU/mL   Comment 3            Comment:   GEST. AGE      CONC.  (mIU/mL)   <=1 WEEK        5 - 50     2 WEEKS       50 - 500     3 WEEKS       100 - 10,000     4 WEEKS     1,000 - 30,000        FEMALE AND NON-PREGNANT FEMALE:     LESS THAN 5 mIU/mL   SARS Coronavirus 2 (CEPHEID - Performed in [facility] hospital lab), Hosp Order     Status: None   Collection Time: 10/09/18  3:31 AM  Result Value Ref Range   SARS Coronavirus 2 NEGATIVE NEGATIVE    Comment: (NOTE) If result is NEGATIVE SARS-CoV-2  target nucleic acids are NOT DETECTED. The SARS-CoV-2 RNA is generally detectable in upper and lower  respiratory specimens during the acute phase of infection. The lowest  concentration of SARS-CoV-2 viral copies this assay can detect is 250  copies / mL. A negative result does not preclude SARS-CoV-2 infection  and should not be used as the sole basis for treatment or other  patient management decisions.  A negative result may occur with  improper specimen collection / handling, submission of specimen other  than nasopharyngeal swab, presence of viral mutation(s) within the  areas targeted by this assay, and inadequate number of viral copies  (<250 copies / mL). A negative result must be combined with clinical  observations, patient history, and epidemiological information. If result is POSITIVE SARS-CoV-2 target nucleic acids are DETECTED. The SARS-CoV-2 RNA is generally detectable in  upper and lower  respiratory specimens dur ing the acute phase of infection.  Positive  results are indicative of active infection with SARS-CoV-2.  Clinical  correlation with patient history and other diagnostic information is  necessary to determine patient infection status.  Positive results do  not rule out bacterial infection or co-infection with other viruses. If result is PRESUMPTIVE POSTIVE SARS-CoV-2 nucleic acids MAY BE PRESENT.   A presumptive positive result was obtained on the submitted specimen  and confirmed on repeat testing.  While 2019 novel coronavirus  (SARS-CoV-2) nucleic acids may be present in the submitted sample  additional confirmatory testing may be necessary for epidemiological  and / or clinical management purposes  to differentiate between  SARS-CoV-2 and other Sarbecovirus currently known to infect humans.  If clinically indicated additional testing with an alternate test  methodology [phone]) is advised. The SARS-CoV-2 RNA is generally  detectable in upper and lower  respiratory sp ecimens during the acute  phase of infection. The expected result is Negative. Fact Sheet for Patients:  BoilerBrush.com.cy Fact Sheet for Healthcare Providers: https://pope.com/ This test is not yet approved or cleared by the [place] FDA and has been authorized for detection and/or diagnosis of SARS-CoV-2 by FDA under an Emergency Use Authorization (EUA).  This EUA will remain in effect (meaning this test can be used) for the duration of the COVID-19 declaration under Section 564(b)(1) of the Act, 21 U.S.C. section 360bbb-3(b)(1), unless the authorization is terminated or revoked sooner. Performed at [facility] Lab, [address], [place], [place] [address]   Basic metabolic panel     Status: Abnormal   Collection Time: 10/09/18  5:07 AM  Result Value Ref Range   Sodium 140 135 - 145 mmol/L   Potassium 3.0 (L) 3.5 - 5.1 mmol/L   Chloride 107 98 - 111 mmol/L   CO2 17 (L) 22 - 32 mmol/L   Glucose, Bld 135 (H) 70 - 99 mg/dL   BUN 8 6 - 20 mg/dL   Creatinine, Ser 8.11 0.44 - 1.00 mg/dL   Calcium 91.4 (H) 8.9 - 10.3 mg/dL   GFR calc non Af Amer >60 >60 mL/min   GFR calc Af Amer >60 >60 mL/min   Anion gap 16 (H) 5 - 15    Comment: Performed at [facility] Lab, [address], [place], [place] [address]  Magnesium     Status: None   Collection Time: 10/09/18  5:07 AM  Result Value Ref Range   Magnesium 2.1 1.7 - 2.4 mg/dL    Comment: Performed at [facility] Lab, [address] [place], [place] [address]  Hepatic function panel     Status: Abnormal   Collection Time: 10/09/18  5:07 AM  Result Value Ref Range   Total Protein 8.3 (H) 6.5 - 8.1 g/dL   Albumin 4.7 3.5 - 5.0 g/dL   AST 20 15 - 41 U/L   ALT 21 0 - 44 U/L   Alkaline Phosphatase 60 38 - 126 U/L   Total Bilirubin 0.8 0.3 - 1.2 mg/dL   Bilirubin, Direct <8.6 0.0 - 0.2 mg/dL   Indirect Bilirubin NOT CALCULATED 0.3 - 0.9 mg/dL    Comment:  Performed at [facility] Lab, [address], [place], [place] [address]  Acetaminophen level     Status: None   Collection Time: 10/09/18  5:07 AM  Result Value Ref Range   Acetaminophen (Tylenol), Serum 23 10 - 30 ug/mL  Comment: (NOTE) Therapeutic concentrations vary significantly. A range of 10-30 ug/mL  may be an effective concentration for many patients. However, some  are best treated at concentrations outside of this range. Acetaminophen concentrations >150 ug/mL at 4 hours after ingestion  and >50 ug/mL at 12 hours after ingestion are often associated with  toxic reactions. Performed at [facility], [address] [place], [place] [address]   Protime-INR     Status: None   Collection Time: 10/09/18  5:07 AM  Result Value Ref Range   Prothrombin Time 14.5 11.4 - 15.2 seconds   INR 1.1 0.8 - 1.2    Comment: (NOTE) INR goal varies based on device and disease states. Performed at [facility], [address] [place], [place] [address]   Basic metabolic panel     Status: Abnormal   Collection Time: 10/09/18 11:05 AM  Result Value Ref Range   Sodium 137 135 - 145 mmol/L   Potassium 4.7 3.5 - 5.1 mmol/L    Comment: DELTA CHECK NOTED   Chloride 105 98 - 111 mmol/L   CO2 21 (L) 22 - 32 mmol/L   Glucose, Bld 78 70 - 99 mg/dL   BUN 9 6 - 20 mg/dL   Creatinine, Ser 0.980.88 0.44 - 1.00 mg/dL   Calcium 11.910.4 (H) 8.9 - 10.3 mg/dL   GFR calc non Af Amer >60 >60 mL/min   GFR calc Af Amer >60 >60 mL/min   Anion gap 11 5 - 15    Comment: Performed at [facility], [address] [place], [place] [address]    Current Facility-Administered Medications  Medication Dose Route Frequency Provider Last Rate Last Dose  . acetylcysteine (MUCOMYST) 20 % nebulizer / oral solution 5,580 mg  70 mg/kg Oral Q4H John Giovanniathore, Vasundhra, MD   5,580 mg at 10/09/18 1010  . enoxaparin (LOVENOX) injection 40 mg  40 mg Subcutaneous Daily John Giovanniathore, Vasundhra, MD      . nicotine  (NICODERM CQ - dosed in mg/24 hours) patch 21 mg  21 mg Transdermal Daily John Giovanniathore, Vasundhra, MD   21 mg at 10/09/18 1010    Musculoskeletal: Strength & Muscle Tone: within normal limits Gait & Station: normal Patient leans: N/A  Psychiatric Specialty Exam: Physical Exam  Psychiatric: Her speech is normal. Her affect is angry and labile. She is agitated and aggressive. Cognition and memory are normal. She expresses impulsivity. She expresses suicidal ideation. She expresses suicidal plans.    Review of Systems  Constitutional: Negative.   HENT: Negative.   Eyes: Negative.   Respiratory: Negative.   Cardiovascular: Negative.   Gastrointestinal: Negative.   Genitourinary: Negative.   Skin: Negative.   Neurological: Negative.   Endo/Heme/Allergies: Negative.   Psychiatric/Behavioral: Positive for substance abuse and suicidal ideas.    Blood pressure 103/90, pulse (!) 59, temperature 98.3 F (36.8 C), temperature source Oral, resp. rate 18, height 5' 5.5" (1.664 m), weight 79.8 kg, SpO2 100 %.Body mass index is 28.84 kg/m.  General Appearance: Casual  Eye Contact:  Good  Speech:  Clear and Coherent  Volume:  Increased  Mood:  Angry and Irritable  Affect:  Labile  Thought Process:  Coherent  Orientation:  Full (Time, Place, and Person)  Thought Content:  Logical  Suicidal Thoughts:  Yes.  with intent/plan  Homicidal Thoughts:  No  Memory:  Immediate;   Good Recent;   Good Remote;   Good  Judgement:  Poor  Insight:  Lacking  Psychomotor Activity:  Increased  Concentration:  Concentration: Fair and Attention Span: Fair  Recall:  Good  Fund of Knowledge:  Good  Language:  Good  Akathisia:  No  Handed:  Right  AIMS (if indicated):     Assets:  Communication Skills Social Support  ADL's:  Intact  Cognition:  WNL  Sleep:   poor     Treatment Plan Summary: 23 year old female with history of mood disorder, Anxiety, Depression who is not compliant with medications. She  was admitted after she attempted suicide by overdosing on multiple medications and currently unable to contract for safety. Patient will benefit from inpatient psychiatric admission for stabilization.  Recommendations: -Continue 1:1 sitter for safety -Consider Lamictal 25 mg daily for mood and Trazodone 100 mg for depression/sleep -Consider placing patient on IVC if she refused Voluntary inpatient psychiatric admission -Consider social worker consult to facilitate inpatient psychiatric placement.  Disposition: Recommend psychiatric Inpatient admission when medically cleared. Supportive therapy provided about ongoing stressors. Psychiatric service signing out. Re-consult as needed  Thedore Mins, MD 10/09/2018 1:29 PM

## 2018-10-09 NOTE — Progress Notes (Signed)
Patient now resting comfortably in bed

## 2018-10-09 NOTE — Progress Notes (Signed)
TTS called to complete assessment, was informed by Montey Hora, RN that pt is being medically admitted and TTS consult will be rescinded.   Princess Bruins, MSW, LCSW Therapeutic Triage Specialist  [phone]

## 2018-10-09 NOTE — Progress Notes (Signed)
PROGRESS NOTE    Jenna Henry  NWG:[id number] DOB: 12/13/1995 DOA: 10/08/2018 PCP: Jenna Henry, Jenna B, MD      Brief Narrative:  Jenna Henry is a 23 y.o. F with depression who presented after acetaminophen overdose.   Assessment & Plan:  Acetaminophen overdose, intentional -Continue NAC -Discussed with Poison Control -Repeat LFTs, INR, acetaminophen level at 0500 tomorrow -Complete NAC tonight at midnight -Consult Psychiatry, will need Inpatient psychiatry admission post-stabilization -Continue Lamictal per psychiatry     DVT prophylaxis: Lovenox Code Status: FULL Family Communication: None MDM and disposition Plan: This is a no charge note.  For further details, please see H&P by my partner Dr. Loney Lohathore from earlier today.  The below labs and imaging reports were reviewed and summarized above.    The patient was admitted with intentional overdose of acetaminophen.  No liver injury at present.    Objective: Vitals:   10/09/18 0417 10/09/18 0626 10/09/18 0659 10/09/18 1534  BP: 114/82 103/90  93/62  Pulse: 71 (!) 59  (!) 56  Resp: 18   10  Temp: 98.4 F (36.9 C) 98.3 F (36.8 C)  98 F (36.7 C)  TempSrc: Oral Oral  Oral  SpO2: 100% 100%  100%  Weight:      Height:   5' 5.5" (1.664 m)     Intake/Output Summary (Last 24 hours) at 10/09/2018 1717 Last data filed at 10/09/2018 1400 Gross per 24 hour  Intake 840 ml  Output -  Net 840 ml   Filed Weights   10/09/18 0201  Weight: 79.8 kg    Examination: The patient was seen and examined.      Data Reviewed: I have personally reviewed following labs and imaging studies:  CBC: Recent Labs  Lab 10/09/18 0012  WBC 6.8  HGB 14.5  HCT 44.0  MCV 96.5  PLT 247   Basic Metabolic Panel: Recent Labs  Lab 10/09/18 0012 10/09/18 0507 10/09/18 1105  NA 138 140 137  K 3.3* 3.0* 4.7  CL 106 107 105  CO2 21* 17* 21*  GLUCOSE 96 135* 78  BUN 7 8 9   CREATININE 1.05* 0.93 0.88  CALCIUM 11.0* 10.9* 10.4*  MG   --  2.1  --    GFR: Estimated Creatinine Clearance: 107.5 mL/min (by C-G formula based on SCr of 0.88 mg/dL). Liver Function Tests: Recent Labs  Lab 10/09/18 0012 10/09/18 0507  AST 22 20  ALT 22 21  ALKPHOS 57 60  BILITOT 0.6 0.8  PROT 8.2* 8.3*  ALBUMIN 4.9 4.7   No results for input(s): LIPASE, AMYLASE in the last 168 hours. No results for input(s): AMMONIA in the last 168 hours. Coagulation Profile: Recent Labs  Lab 10/09/18 0507  INR 1.1   Cardiac Enzymes: No results for input(s): CKTOTAL, CKMB, CKMBINDEX, TROPONINI in the last 168 hours. BNP (last 3 results) No results for input(s): PROBNP in the last 8760 hours. HbA1C: No results for input(s): HGBA1C in the last 72 hours. CBG: No results for input(s): GLUCAP in the last 168 hours. Lipid Profile: No results for input(s): CHOL, HDL, LDLCALC, TRIG, CHOLHDL, LDLDIRECT in the last 72 hours. Thyroid Function Tests: No results for input(s): TSH, T4TOTAL, FREET4, T3FREE, THYROIDAB in the last 72 hours. Anemia Panel: No results for input(s): VITAMINB12, FOLATE, FERRITIN, TIBC, IRON, RETICCTPCT in the last 72 hours. Urine analysis:    Component Value Date/Time   COLORURINE YELLOW 12/19/2017 0607   APPEARANCEUR HAZY (A) 12/19/2017 0607   LABSPEC 1.020 01/20/2018 1053  PHURINE 7.5 01/20/2018 1053   GLUCOSEU NEGATIVE 01/20/2018 1053   HGBUR NEGATIVE 01/20/2018 1053   BILIRUBINUR NEGATIVE 01/20/2018 1053   BILIRUBINUR neg 02/11/2017 0913   KETONESUR NEGATIVE 01/20/2018 1053   PROTEINUR NEGATIVE 01/20/2018 1053   UROBILINOGEN 1.0 01/20/2018 1053   NITRITE NEGATIVE 01/20/2018 1053   LEUKOCYTESUR NEGATIVE 01/20/2018 1053   Sepsis Labs: (procalcitonin:4,lacticacidven:4)  ) Recent Results (from the past 240 hour(s))  SARS Coronavirus 2 (CEPHEID - Performed in [facility] hospital lab), Hosp Order     Status: None   Collection Time: 10/09/18  3:31 AM  Result Value Ref Range Status   SARS Coronavirus 2  NEGATIVE NEGATIVE Final    Comment: (NOTE) If result is NEGATIVE SARS-CoV-2 target nucleic acids are NOT DETECTED. The SARS-CoV-2 RNA is generally detectable in upper and lower  respiratory specimens during the acute phase of infection. The lowest  concentration of SARS-CoV-2 viral copies this assay can detect is 250  copies / mL. A negative result does not preclude SARS-CoV-2 infection  and should not be used as the sole basis for treatment or other  patient management decisions.  A negative result may occur with  improper specimen collection / handling, submission of specimen other  than nasopharyngeal swab, presence of viral mutation(s) within the  areas targeted by this assay, and inadequate number of viral copies  (<250 copies / mL). A negative result must be combined with clinical  observations, patient history, and epidemiological information. If result is POSITIVE SARS-CoV-2 target nucleic acids are DETECTED. The SARS-CoV-2 RNA is generally detectable in upper and lower  respiratory specimens dur ing the acute phase of infection.  Positive  results are indicative of active infection with SARS-CoV-2.  Clinical  correlation with patient history and other diagnostic information is  necessary to determine patient infection status.  Positive results do  not rule out bacterial infection or co-infection with other viruses. If result is PRESUMPTIVE POSTIVE SARS-CoV-2 nucleic acids MAY BE PRESENT.   A presumptive positive result was obtained on the submitted specimen  and confirmed on repeat testing.  While 2019 novel coronavirus  (SARS-CoV-2) nucleic acids may be present in the submitted sample  additional confirmatory testing may be necessary for epidemiological  and / or clinical management purposes  to differentiate between  SARS-CoV-2 and other Sarbecovirus currently known to infect humans.  If clinically indicated additional testing with an alternate test  methodology [phone])  is advised. The SARS-CoV-2 RNA is generally  detectable in upper and lower respiratory sp ecimens during the acute  phase of infection. The expected result is Negative. Fact Sheet for Patients:  BoilerBrush.com.cy Fact Sheet for Healthcare Providers: https://pope.com/ This test is not yet approved or cleared by the [place] FDA and has been authorized for detection and/or diagnosis of SARS-CoV-2 by FDA under an Emergency Use Authorization (EUA).  This EUA will remain in effect (meaning this test can be used) for the duration of the COVID-19 declaration under Section 564(Henry)(1) of the Act, 21 U.S.C. section 360bbb-3(Henry)(1), unless the authorization is terminated or revoked sooner. Performed at [facility], [address], [place], [place] [address]          Radiology Studies: No results found.      Scheduled Meds: . acetylcysteine  70 mg/kg Oral Q4H  . enoxaparin (LOVENOX) injection  40 mg Subcutaneous Daily  . [START ON 10/10/2018] lamoTRIgine  25 mg Oral Daily  . nicotine  21 mg Transdermal Daily  . traZODone  50 mg Oral QHS   Continuous Infusions:   LOS: 0 days    Time spent: 45 minutes    Alberteen Sam, MD [facility] 10/09/2018, 5:17 PM     Pager [phone] --- please page though AMION:  [email or web address] Password [email or web address] If 7PM-7AM, please contact night-coverage

## 2018-10-09 NOTE — ED Notes (Signed)
Pt is refusing cardiac monitoring as well as IV insertion.  Admitting provider is aware.

## 2018-10-09 NOTE — Progress Notes (Signed)
Ativan, Geodan and Benadryl given IM to help calm the patient. Police and security at bedside to help hold patient. At this time we are going to leave the patient unrestrained and continue to monitor and assess her. Dr. Maryfrances Bunnell and Psych aware of situation.

## 2018-10-09 NOTE — ED Notes (Signed)
ED Provider at bedside. 

## 2018-10-09 NOTE — Progress Notes (Signed)
Dr. Maryfrances Bunnell aware that patient is refusing oral mucomyst and telemetry monitoring.

## 2018-10-09 NOTE — ED Notes (Signed)
Per poison control, [name] : Watch for metabolic acidosis from yesterdays meds, watch from agitation/drowsness as well.  4-6 hours observation for steroids.  All labs they suggested were in our protocol and have been drawn.

## 2018-10-09 NOTE — ED Provider Notes (Signed)
Patient seen/examined in the Emergency Department in conjunction with Midlevel Provider upstill Patient reports overdose in a suicide attempt Exam : Alert, anxious but directable Plan: Poison center recommends treatment for APAP toxicity as Tylenol level is increased with unknown time of onset.  This was explained to the patient Patient was threatening to leave, IVC paperwork has been completed   Zadie Rhine, MD 10/09/18 [phone]

## 2018-10-10 ENCOUNTER — Other Ambulatory Visit: Payer: Self-pay | Admitting: Behavioral Health

## 2018-10-10 ENCOUNTER — Inpatient Hospital Stay (HOSPITAL_COMMUNITY)
Admission: AD | Admit: 2018-10-10 | Discharge: 2018-10-13 | DRG: 885 | Disposition: A | Discharge status: Home / Self Care | Payer: No Typology Code available for payment source | Attending: Psychiatry | Admitting: Psychiatry

## 2018-10-10 ENCOUNTER — Encounter (HOSPITAL_COMMUNITY): Payer: Self-pay | Admitting: Behavioral Health

## 2018-10-10 ENCOUNTER — Other Ambulatory Visit: Payer: Self-pay

## 2018-10-10 DIAGNOSIS — F419 Anxiety disorder, unspecified: Secondary | ICD-10-CM | POA: Diagnosis present

## 2018-10-10 DIAGNOSIS — E876 Hypokalemia: Secondary | ICD-10-CM

## 2018-10-10 DIAGNOSIS — J45909 Unspecified asthma, uncomplicated: Secondary | ICD-10-CM | POA: Diagnosis present

## 2018-10-10 DIAGNOSIS — T50902A Poisoning by unspecified drugs, medicaments and biological substances, intentional self-harm, initial encounter: Secondary | ICD-10-CM | POA: Diagnosis present

## 2018-10-10 DIAGNOSIS — F329 Major depressive disorder, single episode, unspecified: Secondary | ICD-10-CM | POA: Insufficient documentation

## 2018-10-10 DIAGNOSIS — Z560 Unemployment, unspecified: Secondary | ICD-10-CM

## 2018-10-10 DIAGNOSIS — F319 Bipolar disorder, unspecified: Secondary | ICD-10-CM | POA: Diagnosis present

## 2018-10-10 DIAGNOSIS — T391X2A Poisoning by 4-Aminophenol derivatives, intentional self-harm, initial encounter: Secondary | ICD-10-CM

## 2018-10-10 DIAGNOSIS — F17210 Nicotine dependence, cigarettes, uncomplicated: Secondary | ICD-10-CM | POA: Diagnosis present

## 2018-10-10 DIAGNOSIS — Z590 Homelessness: Secondary | ICD-10-CM

## 2018-10-10 LAB — HEPATIC FUNCTION PANEL
ALT: 17 U/L (ref 0–44)
AST: 33 U/L (ref 15–41)
Albumin: 4.3 g/dL (ref 3.5–5.0)
Alkaline Phosphatase: 57 U/L (ref 38–126)
Bilirubin, Direct: 0.4 mg/dL — ABNORMAL HIGH (ref 0.0–0.2)
Indirect Bilirubin: 1.3 mg/dL — ABNORMAL HIGH (ref 0.3–0.9)
Total Bilirubin: 1.7 mg/dL — ABNORMAL HIGH (ref 0.3–1.2)
Total Protein: 7.4 g/dL (ref 6.5–8.1)

## 2018-10-10 LAB — ACETAMINOPHEN LEVEL: Acetaminophen (Tylenol), Serum: <10 ug/mL — ABNORMAL LOW (ref 10–30)

## 2018-10-10 MED ORDER — NICOTINE 14 MG/24HR TD PT24
14.0000 mg | MEDICATED_PATCH | Freq: Every day | TRANSDERMAL | Status: DC
  Administered 2018-10-11: 09:00:00 14 mg via TRANSDERMAL
  Filled 2018-10-10 (×3): qty 1

## 2018-10-10 MED ORDER — ACETAMINOPHEN 325 MG PO TABS: 650.0000 mg | ORAL_TABLET | Freq: Four times a day (QID) | ORAL | Status: DC | PRN

## 2018-10-10 MED ORDER — ADULT MULTIVITAMIN W/MINERALS CH
1.0000 | ORAL_TABLET | Freq: Every day | ORAL | Status: DC
  Administered 2018-10-10: 1 via ORAL
  Filled 2018-10-10: qty 1

## 2018-10-10 MED ORDER — LAMOTRIGINE 25 MG PO TABS: 25.0000 mg | ORAL_TABLET | Freq: Every day | ORAL | Status: DC

## 2018-10-10 MED ORDER — ALUM & MAG HYDROXIDE-SIMETH 200-200-20 MG/5ML PO SUSP: 30.0000 mL | ORAL | Status: DC | PRN

## 2018-10-10 MED ORDER — ENSURE ENLIVE PO LIQD
237.0000 mL | Freq: Three times a day (TID) | ORAL | Status: DC
  Administered 2018-10-10: 237 mL via ORAL

## 2018-10-10 NOTE — Progress Notes (Signed)
Pt accepted to  [facility], Bed 405-1 Dr. Jannifer Franklin, MD is the accepting provider.  Dr. Jola Babinski, MD is the attending provider.  Call report to [phone]  Judd Gaudier, [email or web address] notified.   Pt is IVC Pt may be transported by [facility] Pt scheduled  to arrive at [facility] as soon as transport can be arranged.  Timmothy Euler. Kaylyn Lim, MSW, LCSW Disposition Clinical Social Work [phone] (cell) [phone] (office)

## 2018-10-10 NOTE — Progress Notes (Addendum)
2pm-CSW received consult regarding inpatient psych placement. CSW confirmed IVC paperwork on her shadow chart. CSW sent referral to [facility] for review.   3pm-CSW notified patient that she is able to go to [facility] today. She reports that she has been there before and hopes not to stay very long.   Osborne Casco Parrie Rasco LCSW [phone]
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
## 2018-10-10 NOTE — TOC Transition Note (Signed)
Transition of Care [facility]) - CM/SW Discharge Note   Patient Details  Name: Jenna Henry MRN: [id number] Date of Birth: 1996-05-24  Transition of [facility]) CM/SW Contact:  Mearl Latin, LCSW Phone Number: 10/10/2018, 4:33 PM   Clinical Narrative:    Patient will DC to: [facility] Bed 405-1 Anticipated DC date: 10/10/18 Family notified: Patient oriented x4 Transport by: Patent examiner as soon as available   Per MD patient ready for DC to [facility]. RN, patient, and facility notified of DC. RN to call report prior to discharge [phone]). IVC packet on chart. Transport requested for patient.   CSW will sign off for now as social work intervention is no longer needed. Please consult [place] again if new needs arise.  Cristobal Goldmann, LCSW Clinical Social Worker [phone]    Final next level of care: Psychiatric Hospital Barriers to Discharge: No Barriers Identified   Patient Goals and CMS Choice Patient states their goals for this hospitalization and ongoing recovery are:: RETURN HOME   Choice offered to / list presented to : NA  Discharge Placement                       Discharge Plan and Services In-house Referral: Clinical Social Work Discharge Planning Services: NA Post Acute Care Choice: NA          DME Arranged: N/A DME Agency: NA       HH Arranged: NA HH Agency: NA        Social Determinants of Health (SDOH) Interventions     Readmission Risk Interventions Readmission Risk Prevention Plan 10/10/2018  Post Dischage Appt Complete  Medication Screening Complete  Transportation Screening Complete  Some recent data might be hidden

## 2018-10-10 NOTE — Progress Notes (Signed)
RN gave report to RN at [facility]) about pt. RN spoke to [place] CM/SW who reported transportation has been arranged. Pt is aware.

## 2018-10-10 NOTE — Tx Team (Signed)
Initial Treatment Plan 10/10/2018 6:47 PM Jenna Henry [id number]:[id number]    PATIENT STRESSORS: Financial difficulties Marital or family conflict   PATIENT STRENGTHS: Ability for insight Capable of independent living   PATIENT IDENTIFIED PROBLEMS: "anger"   "attitude and mood"                    DISCHARGE CRITERIA:  Ability to meet basic life and health needs Adequate post-discharge living arrangements Improved stabilization in mood, thinking, and/or behavior  PRELIMINARY DISCHARGE PLAN: Attend aftercare/continuing care group  PATIENT/FAMILY INVOLVEMENT: This treatment plan has been presented to and reviewed with the patient, Jenna Henry, and/or family member.  The patient and family have been given the opportunity to ask questions and make suggestions.  Layla Barter, RN 10/10/2018, 6:47 PM

## 2018-10-10 NOTE — Progress Notes (Signed)
Pt requested RN speak to on call Behavioral Health MD about not doing IVC and being set up with an outpatient therapist instead.  RN spoke to Sharma Covert MD and reported pt's request and RN's findings from assessment today Sharma Covert MD stated that she agrees with Akintayo MD's assessment that was done in last 24 hours, and pt will still need to go through IVC.   RN reported direction of care to pt and she verbalized understanding. RN will continue to monitor pt.
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
## 2018-10-11 ENCOUNTER — Other Ambulatory Visit: Payer: Self-pay

## 2018-10-11 DIAGNOSIS — F319 Bipolar disorder, unspecified: Secondary | ICD-10-CM | POA: Diagnosis present

## 2018-10-11 LAB — LIPID PANEL
Cholesterol: 147 mg/dL (ref 0–200)
HDL: 30 mg/dL — ABNORMAL LOW (ref >40)
LDL Cholesterol: 96 mg/dL (ref 0–99)
Total CHOL/HDL Ratio: 4.9 RATIO
Triglycerides: 106 mg/dL (ref <150)
VLDL: 21 mg/dL (ref 0–40)

## 2018-10-11 LAB — HEMOGLOBIN A1C
Hgb A1c MFr Bld: 5 % (ref 4.8–5.6)
Mean Plasma Glucose: 96.8 mg/dL

## 2018-10-11 LAB — TSH: TSH: 1.586 u[IU]/mL (ref 0.350–4.500)

## 2018-10-11 MED ORDER — VENLAFAXINE HCL ER 37.5 MG PO CP24
37.5000 mg | ORAL_CAPSULE | Freq: Every day | ORAL | Status: DC
  Administered 2018-10-12 – 2018-10-13 (×2): 37.5 mg via ORAL
  Filled 2018-10-11 (×3): qty 1

## 2018-10-11 MED ORDER — NICOTINE 21 MG/24HR TD PT24
MEDICATED_PATCH | TRANSDERMAL | Status: AC
  Filled 2018-10-11: qty 1

## 2018-10-11 MED ORDER — LORAZEPAM 0.5 MG PO TABS: 0.5000 mg | ORAL_TABLET | Freq: Four times a day (QID) | ORAL | Status: DC | PRN

## 2018-10-11 MED ORDER — NICOTINE POLACRILEX 2 MG MT GUM
2.0000 mg | CHEWING_GUM | OROMUCOSAL | Status: DC | PRN
  Administered 2018-10-12 – 2018-10-13 (×3): 2 mg via ORAL
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
## 2018-10-11 NOTE — Progress Notes (Signed)
[place] NOVEL CORONAVIRUS (COVID-19) DAILY CHECK-OFF SYMPTOMS - answer yes or no to each - every day NO YES  Have you had a fever in the past 24 hours?  . Fever (Temp > 37.80C / 100F) X   Have you had any of these symptoms in the past 24 hours? . New Cough .  Sore Throat  .  Shortness of Breath .  Difficulty Breathing .  Unexplained Body Aches   X   Have you had any one of these symptoms in the past 24 hours not related to allergies?   . Runny Nose .  Nasal Congestion .  Sneezing   X   If you have had runny nose, nasal congestion, sneezing in the past 24 hours, has it worsened?  X   EXPOSURES - check yes or no X   Have you traveled outside the state in the past 14 days?  X   Have you been in contact with someone with a confirmed diagnosis of COVID-19 or PUI in the past 14 days without wearing appropriate PPE?  X   Have you been living in the same home as a person with confirmed diagnosis of COVID-19 or a PUI (household contact)?    X   Have you been diagnosed with COVID-19?    X              What to do next: Answered NO to all: Answered YES to anything:   Proceed with unit schedule Follow the [facility] Inpatient Flowsheet.   

## 2018-10-11 NOTE — Progress Notes (Signed)
Pt in dayroom writing in her journal upon initial approach. Pt appears with depressed mood. Pt reports her day as a "7" and her goal was to work on her anger issues and get out of here to see her fiancee. Pt able to take a shower. Pt denies SI/HI or hallucinations (a) 15 min checks (r) safety maintained.

## 2018-10-11 NOTE — Progress Notes (Signed)
Patient rated her day as a 9 out of a possible 10. She attributed her positive day to having found out her discharge date. Her goal for tomorrow is to try to get more rest.

## 2018-10-11 NOTE — Progress Notes (Signed)
Pt presents with an anxious affect. Pt expressed decreased depression and anxiety today but reported needing to work on her mood and anger episodes. Pt stated "keeping my attitude and anger in place. Work on expressing my emotions and not holding everything in." Pt denies SI/HI.   Orders reviewed. Verbal support provided. Pt encouraged to attend groups. 15 minute checks performed for safety.  Pt compliant with tx plan.
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
## 2018-10-11 NOTE — Progress Notes (Signed)
Pt attended spiritual care group on grief and loss facilitated by chaplain Burnis Kingfisher   Group Goal:  Support / Education around grief and loss Members engage in facilitated group support and psycho social education.   Group Description:  Following introductions and group rules,  Group members engaged in facilitated group dialog and support around topic of loss, with particular support around experiences of loss in their lives. Group Identified types of loss (relationships / self / things) and identified patterns, circumstances, and changes that precipitate losses. Reflected on thoughts / feelings around loss, normalized grief responses, and recognized variety in grief experience.  Patient Progress:   Jenna Henry was present throughout group.  Engaged in facilitated conversation appropriately.  Utilized group time to process relationship with mother - describing attempting to balance boundaries and care for mother.  Spoke about anger as a response she feels and spoke with group about attempting to identify the feelings behind anger.  Jenna Henry's coping strategy is to walk away and take some time to breathe - however, she feels others do not allow her this space and interpret this in a negative way.

## 2018-10-12 DIAGNOSIS — R45851 Suicidal ideations: Secondary | ICD-10-CM

## 2018-10-12 MED ORDER — TRAZODONE HCL 50 MG PO TABS
50.0000 mg | ORAL_TABLET | Freq: Once | ORAL | Status: AC
  Administered 2018-10-12: 50 mg via ORAL
  Filled 2018-10-12 (×2): qty 1

## 2018-10-12 NOTE — Progress Notes (Signed)
[facility] MD Progress Note  10/12/2018 2:12 PM Jenna Henry  MRN:  [id number] Subjective:  Patient reports she is feeling " a lot better". Denies medication side effects. Denies suicidal ideations. Objective : I have discussed case with treatment team and have met with patient. 23 year old female, presented following episode of self cutting and overdose on Ibuprofen, antihistamine, steroid medication over a span of 1-2 days prior to admission. Attributed to feeling depressed, overwhelmed and angry related mainly to argument with fiance and mother. She had recently relocated from [facility]. History of depression and prior suicidal attempt by overdosing in 2018.  Today patient reports she is feeling " a lot better", which she attributes in part to resolved stressors. States she has had good conversations with her fiance and with her mother and feels supported and that there " is a plan for [place]". Presents with improving mood and a full range of affect today. Denies suicidal ideations and presents future oriented, looking forward to discharge soon. She is planning on returning to live with her fiance  to [place] , [place] following discharge. Denies medication side effects. Denies suicidal ideations. No disruptive or agitated behaviors on unit.   Principal Problem: Bipolar disorder ([place]) Diagnosis: Principal Problem:   Bipolar disorder ([place]) Active Problems:   Intentional drug overdose ([place])  Total Time spent with patient: 20 minutes  Past Psychiatric History:   Past Medical History:  Past Medical History:  Diagnosis Date  . Anxiety   . Asthma   . Depression   . Herpes   . Polycystic ovaries     Past Surgical History:  Procedure Laterality Date  . CYSTECTOMY     Family History:  Family History  Problem Relation Age of Onset  . Hypertension Mother   . Heart murmur Father   . Heart disease Maternal Grandfather   . Diabetes Paternal Grandmother   . Cancer Paternal Grandmother    Family Psychiatric   History:  Social History:  Social History   Substance and Sexual Activity  Alcohol Use No     Social History   Substance and Sexual Activity  Drug Use Yes  . Types: Marijuana    Social History   Socioeconomic History  . Marital status: Single    Spouse name: Not on file  . Number of children: Not on file  . Years of education: Not on file  . Highest education level: Not on file  Occupational History  . Not on file  Social Needs  . Financial resource strain: Not on file  . Food insecurity:    Worry: Not on file    Inability: Not on file  . Transportation needs:    Medical: Not on file    Non-medical: Not on file  Tobacco Use  . Smoking status: Current Some Day Smoker    Packs/day: 1.00  . Smokeless tobacco: Never Used  . Tobacco comment: pt reports smokeing 1 Black and mild daily  Substance and Sexual Activity  . Alcohol use: No  . Drug use: Yes    Types: Marijuana  . Sexual activity: Yes  Lifestyle  . Physical activity:    Days per week: Not on file    Minutes per session: Not on file  . Stress: Not on file  Relationships  . Social connections:    Talks on phone: Not on file    Gets together: Not on file    Attends religious service: Not on file    Active member of club  or organization: Not on file    Attends meetings of clubs or organizations: Not on file    Relationship status: Not on file  Other Topics Concern  . Not on file  Social History Narrative  . Not on file   Additional Social History:   Sleep: Good  Appetite:  Good  Current Medications: Current Facility-Administered Medications  Medication Dose Route Frequency Provider Last Rate Last Dose  . acetaminophen (TYLENOL) tablet 650 mg  650 mg Oral Q6H PRN Mordecai Maes, NP      . alum & mag hydroxide-simeth (MAALOX/MYLANTA) 200-200-20 MG/5ML suspension 30 mL  30 mL Oral Q4H PRN Mordecai Maes, NP      . LORazepam (ATIVAN) tablet 0.5 mg  0.5 mg Oral Q6H PRN , Myer Peer, MD      .  nicotine polacrilex (NICORETTE) gum 2 mg  2 mg Oral PRN Sharma Covert, MD   2 mg at 10/12/18 1106  . venlafaxine XR (EFFEXOR-XR) 24 hr capsule 37.5 mg  37.5 mg Oral Q breakfast , Myer Peer, MD   37.5 mg at 10/12/18 7622    Lab Results:  Results for orders placed or performed during the hospital encounter of 10/10/18 (from the past 48 hour(s))  Hemoglobin A1c     Status: None   Collection Time: 10/11/18  6:11 AM  Result Value Ref Range   Hgb A1c MFr Bld 5.0 4.8 - 5.6 %    Comment: (NOTE) Pre diabetes:          5.7%-6.4% Diabetes:              >6.4% Glycemic control for   <7.0% adults with diabetes    Mean Plasma Glucose 96.8 mg/dL    Comment: Performed at [facility], [place] [address], [place], [place] [address]  Lipid panel     Status: Abnormal   Collection Time: 10/11/18  6:11 AM  Result Value Ref Range   Cholesterol 147 0 - 200 mg/dL   Triglycerides 106 <150 mg/dL   HDL 30 (L) >40 mg/dL   Total CHOL/HDL Ratio 4.9 RATIO   VLDL 21 0 - 40 mg/dL   LDL Cholesterol 96 0 - 99 mg/dL    Comment:        Total Cholesterol/HDL:CHD Risk Coronary Heart Disease Risk Table                     Men   Women  1/2 Average Risk   3.4   3.3  Average Risk       5.0   4.4  2 X Average Risk   9.6   7.1  3 X Average Risk  23.4   11.0        Use the calculated Patient Ratio above and the CHD Risk Table to determine the patient's CHD Risk.        ATP III CLASSIFICATION (LDL):  <100     mg/dL   Optimal  100-129  mg/dL   Near or Above                    Optimal  130-159  mg/dL   Borderline  160-189  mg/dL   High  >190     mg/dL   Very High Performed at [facility] [address], [place], [place] [address]   TSH     Status: None   Collection Time: 10/11/18  6:11 AM  Result Value Ref Range  TSH 1.586 0.350 - 4.500 uIU/mL    Comment: Performed by a 3rd Generation assay with a functional sensitivity of <=0.01 uIU/mL. Performed at [facility], [place] [address] [place], [place] [address]     Blood Alcohol level:  Lab Results  Component Value Date   ETH <10 10/09/2018   ETH <10 83/38/2505    Metabolic Disorder Labs: Lab Results  Component Value Date   HGBA1C 5.0 10/11/2018   MPG 96.8 10/11/2018   MPG 99.67 02/28/2017   Lab Results  Component Value Date   PROLACTIN 8.3 07/06/2016   PROLACTIN 8.5 07/03/2016   Lab Results  Component Value Date   CHOL 147 10/11/2018   TRIG 106 10/11/2018   HDL 30 (L) 10/11/2018   CHOLHDL 4.9 10/11/2018   VLDL 21 10/11/2018   LDLCALC 96 10/11/2018   LDLCALC 88 03/01/2017    Physical Findings: AIMS: Facial and Oral Movements Muscles of Facial Expression: None, normal Lips and Perioral Area: None, normal Jaw: None, normal Tongue: None, normal,Extremity Movements Upper (arms, wrists, hands, fingers): None, normal Lower (legs, knees, ankles, toes): None, normal, Trunk Movements Neck, shoulders, hips: None, normal, Overall Severity Severity of abnormal movements (highest score from questions above): None, normal Incapacitation due to abnormal movements: None, normal Patient's awareness of abnormal movements (rate only patient's report): No Awareness, Dental Status Current problems with teeth and/or dentures?: No Does patient usually wear dentures?: No  CIWA:  CIWA-Ar Total: 1 COWS:  COWS Total Score: 1  Musculoskeletal: Strength & Muscle Tone: within normal limits Gait & Station: normal Patient leans: N/A  Psychiatric Specialty Exam: Physical Exam  ROS denies headache, no chest pain, no shortness of breath,  no fever, no chills   Blood pressure 109/73, pulse 68, temperature 98 F (36.7 C), temperature source Oral, resp. rate 16, height 5' 5"  (1.651 m), weight 79.8 kg.Body mass index is 29.29 kg/m.  General Appearance: Well Groomed  Eye Contact:  Good  Speech:  Normal Rate  Volume:  Normal  Mood:  improving mood, currently states she is feeling " a lot better"   Affect:  Appropriate and Full Range  Thought Process:  Linear and Descriptions of Associations: Intact  Orientation:  Full (Time, Place, and Person)  Thought Content:  no hallucinations, no delusions, not internally preoccupied   Suicidal Thoughts:  No denies suicidal or self injurious ideations, denies homicidal or violent ideations  Homicidal Thoughts:  No  Memory:  recent and remote grossly intact   Judgement:  Other:  improving ]  Insight:  fair/ improving   Psychomotor Activity:  Normal  Concentration:  Concentration: Good and Attention Span: Good  Recall:  Good  Fund of Knowledge:  Good  Language:  Good  Akathisia:  Negative  Handed:  Right  AIMS (if indicated):     Assets:  Desire for Improvement Resilience  ADL's:  Intact  Cognition:  WNL  Sleep:  Number of Hours: 6.25    Assessment-  23 year old female, presented following episode of self cutting and overdose on Ibuprofen, antihistamine, steroid medication over a span of 1-2 days prior to admission. Attributed to feeling depressed, overwhelmed and angry related mainly to argument with fiance and mother. She had recently relocated from [facility]. History of depression and prior suicidal attempt by overdosing in 2018.  Patient reports feeling a lot better than she did prior to admission and currently presents euthymic, future oriented, planning on returning to [facility] after discharge. Thus far tolerating medications well.  Denies SI.  Treatment Plan Summary: Daily contact with patient to assess and evaluate symptoms and progress in treatment, Medication management, Plan inpatient treatment and medications as below Encourage group and milieu participation to work on coping skills and symptom reduction Continue Effexor XR 37. 5 mgrs QDAY for depression, anxiety Continue Ativan 0.5 mgrs Q 6 hours PRN for insomnia as needed  Treatment team working on disposition planning options. Jenne Campus, MD 10/12/2018, 2:12 PM

## 2018-10-12 NOTE — [facility] Suicide Risk Assessment (Signed)
[facility] INPATIENT:  Family/Significant Other Suicide Prevention Education  Suicide Prevention Education:  Contact Attempts: Jamesetta So [phone]  has been identified by the patient as the family member/significant other with whom the patient will be residing, and identified as the person(s) who will aid the patient in the event of a mental health crisis.  With written consent from the patient, two attempts were made to provide suicide prevention education, prior to and/or following the patient's discharge.  We were unsuccessful in providing suicide prevention education.  A suicide education pamphlet was given to the patient to share with family/significant other.  Date and time of first attempt: 10/12/2018 at 10:08am. Left a HIPAA compliant VM Date and time of second attempt:   Jenna Henry 10/12/2018, 10:08 AM

## 2018-10-12 NOTE — Plan of Care (Signed)
Nurse discussed anxiety, depression and coping skills with patient.  

## 2018-10-12 NOTE — Progress Notes (Signed)
CSW spoke to patient on unit to confirm that patient is relocating to [place] after she discharges. Patient reports she is moving in with her fiance in [place], [place]. It is a small town and she reports that [place], [place] is the nearest city at about 30 minutes away.   CSW will provide patient with community mental health resources for [place] to follow up with at discharge. Patient agreeable to plan. She reports her mother will pick her up from [facility] at 11am tomorrow morning.  Enid Cutter, LCSW-A Clinical Social Worker

## 2018-10-12 NOTE — Tx Team (Signed)
Interdisciplinary Treatment and Diagnostic Plan Update  10/12/2018 Time of Session: 9:00am Jenna Henry MRN: [id number]  Principal Diagnosis: Bipolar disorder [facility])  Secondary Diagnoses: Principal Problem:   Bipolar disorder (HCC) Active Problems:   Intentional drug overdose (H[facility])   Current Medications:  Current Facility-Administered Medications  Medication Dose Route Frequency Provider Last Rate Last Dose  . acetaminophen (TYLENOL) tablet 650 mg  650 mg Oral Q6H PRN Denzil Magnuson, NP      . alum & mag hydroxide-simeth (MAALOX/MYLANTA) 200-200-20 MG/5ML suspension 30 mL  30 mL Oral Q4H PRN Denzil Magnuson, NP      . LORazepam (ATIVAN) tablet 0.5 mg  0.5 mg Oral Q6H PRN Cobos, Rockey Situ, MD      . nicotine polacrilex (NICORETTE) gum 2 mg  2 mg Oral PRN Antonieta Pert, MD      . venlafaxine XR [facility]) 24 hr capsule 37.5 mg  37.5 mg Oral Q breakfast Cobos, Rockey Situ, MD   37.5 mg at 10/12/18 3500   PTA Medications: Medications Prior to Admission  Medication Sig Dispense Refill Last Dose  . albuterol (VENTOLIN HFA) 108 (90 Base) MCG/ACT inhaler Inhale 1-2 puffs into the lungs every 6 (six) hours as needed for wheezing or shortness of breath.   Past Month at Unknown time  . lamoTRIgine (LAMICTAL) 25 MG tablet Take 1 tablet (25 mg total) by mouth daily.       Patient Stressors: Financial difficulties Marital or family conflict  Patient Strengths: Ability for insight Capable of independent living  Treatment Modalities: Medication Management, Group therapy, Case management,  1 to 1 session with clinician, Psychoeducation, Recreational therapy.   Physician Treatment Plan for Primary Diagnosis: Bipolar disorder (HCC) Long Term Goal(s): Improvement in symptoms so as ready for discharge Improvement in symptoms so as ready for discharge   Short Term Goals: Ability to identify changes in lifestyle to reduce recurrence of condition will improve Ability to verbalize feelings  will improve Ability to disclose and discuss suicidal ideas Ability to demonstrate self-control will improve Ability to identify and develop effective coping behaviors will improve Ability to maintain clinical measurements within normal limits will improve Compliance with prescribed medications will improve  Medication Management: Evaluate patient's response, side effects, and tolerance of medication regimen.  Therapeutic Interventions: 1 to 1 sessions, Unit Group sessions and Medication administration.  Evaluation of Outcomes: Progressing  Physician Treatment Plan for Secondary Diagnosis: Principal Problem:   Bipolar disorder (HCC) Active Problems:   Intentional drug overdose (HCC)  Long Term Goal(s): Improvement in symptoms so as ready for discharge Improvement in symptoms so as ready for discharge   Short Term Goals: Ability to identify changes in lifestyle to reduce recurrence of condition will improve Ability to verbalize feelings will improve Ability to disclose and discuss suicidal ideas Ability to demonstrate self-control will improve Ability to identify and develop effective coping behaviors will improve Ability to maintain clinical measurements within normal limits will improve Compliance with prescribed medications will improve     Medication Management: Evaluate patient's response, side effects, and tolerance of medication regimen.  Therapeutic Interventions: 1 to 1 sessions, Unit Group sessions and Medication administration.  Evaluation of Outcomes: Progressing   RN Treatment Plan for Primary Diagnosis: Bipolar disorder (HCC) Long Term Goal(s): Knowledge of disease and therapeutic regimen to maintain health will improve  Short Term Goals: Ability to demonstrate self-control, Ability to identify and develop effective coping behaviors will improve and Compliance with prescribed medications will improve  Medication Management: RN will  administer medications as  ordered by provider, will assess and evaluate patient's response and provide education to patient for prescribed medication. RN will report any adverse and/or side effects to prescribing provider.  Therapeutic Interventions: 1 on 1 counseling sessions, Psychoeducation, Medication administration, Evaluate responses to treatment, Monitor vital signs and CBGs as ordered, Perform/monitor CIWA, COWS, AIMS and Fall Risk screenings as ordered, Perform wound care treatments as ordered.  Evaluation of Outcomes: Progressing   LCSW Treatment Plan for Primary Diagnosis: Bipolar disorder (HCC) Long Term Goal(s): Safe transition to appropriate next level of care at discharge, Engage patient in therapeutic group addressing interpersonal concerns.  Short Term Goals: Engage patient in aftercare planning with referrals and resources, Increase social support, Increase emotional regulation, Identify triggers associated with mental health/substance abuse issues and Increase skills for wellness and recovery  Therapeutic Interventions: Assess for all discharge needs, 1 to 1 time with Social worker, Explore available resources and support systems, Assess for adequacy in community support network, Educate family and significant other(s) on suicide prevention, Complete Psychosocial Assessment, Interpersonal group therapy.  Evaluation of Outcomes: Progressing   Progress in Treatment: Attending groups: Yes. Participating in groups: Yes. Taking medication as prescribed: Yes. Toleration medication: Yes. Family/Significant other contact made: No, will contact:  attempted to reach fiance one time, will make a second attempt Patient understands diagnosis: Yes. Discussing patient identified problems/goals with staff: Yes. Medical problems stabilized or resolved: Yes. Denies suicidal/homicidal ideation: Yes. Issues/concerns per patient self-inventory: No.  New problem(s) identified: No, Describe:  none  New Short  Term/Long Term Goal(s): medication management for mood stabilization; elimination of SI thoughts; development of comprehensive mental wellness/sobriety plan.  Patient Goals: Increased mood regulation and better control of anger.  Discharge Plan or Barriers: Patient reports she will relocate to [facility] at discharge to live with her fiance. CSW will research outpatient providers in the area.  Reason for Continuation of Hospitalization: Aggression Anxiety Depression  Estimated Length of Stay: tomorrow, 05/14  Attendees: Patient: Jenna ManisLaray Henry 10/12/2018 10:43 AM  Physician:  10/12/2018 10:43 AM  Nursing:  10/12/2018 10:43 AM  RN Care Manager: 10/12/2018 10:43 AM  Social Worker: Enid Cutterharlotte Atha Muradyan, LCSWA 10/12/2018 10:43 AM  Recreational Therapist:  10/12/2018 10:43 AM  Other:  10/12/2018 10:43 AM  Other:  10/12/2018 10:43 AM  Other: 10/12/2018 10:43 AM    Scribe for Treatment Team: Darreld Mcleanharlotte C Galia Rahm, LCSWA 10/12/2018 10:43 AM

## 2018-10-12 NOTE — [facility] Group Notes (Signed)
Occupational Therapy Group Note  Date:  10/12/2018 Time:  10:48 AM  Group Topic/Focus:  Self Esteem Action Plan:   The focus of this group is to help patients create a plan to continue to build self-esteem after discharge.  Participation Level:  Active  Participation Quality:  Appropriate  Affect:  Blunted  Cognitive:  Appropriate  Insight: Improving  Engagement in Group:  Engaged  Modes of Intervention:  Activity, Discussion, Education and Socialization  Additional Comments:    S: "Not accomplishing goals can decrease my self esteem"  O: OT tx with focus on self esteem building this date. Education given on definition of self esteem, with both causes of low and high self esteem identified. Activity given for pt to identify a positive/aspiring trait for each letter of the alphabet. Pt to work with peers to help complete activity and build positive thinking.   A: Pt presents with blunted affect, engaged and participatory throughout session. Pt shares that not accomplishing goals decreases her self esteem, while positive hobbies and healthy relationships increase her self esteem. Pt completed A-Z activity with min VC's, notable positive improvement in affect by end of session.  P: OT group will be x1 per week while pt inpatient.  Dalphine Handing, MSOT, OTR/L Behavioral Health OT/ Acute Relief OT PHP Office: [phone]  Dalphine Handing 10/12/2018, 10:48 AM

## 2018-10-12 NOTE — Progress Notes (Signed)
D:  Patient's self inventory sheet, patient sleeps good, no sleep medication.  Good appetite, high energy level, good concentration. Patient denied depression, anxiety, hopelessness.  Denied withdrawals.   Denied SI.  Denied physical problems.  Denied physical pain.  Goal is discharge and maintain good appetite.  Plans to talk to MD.  Does have discharge plans.  A:  Medications administered per MD orders.  Emotional support and encouragement given patient. R:  Denied SI and HI, contracts for safety.  Denied A/V hallucinations.  Safety maintained with 15 minute checks.

## 2018-10-13 DIAGNOSIS — T50902A Poisoning by unspecified drugs, medicaments and biological substances, intentional self-harm, initial encounter: Secondary | ICD-10-CM

## 2018-10-13 MED ORDER — VENLAFAXINE HCL ER 75 MG PO CP24
75.0000 mg | ORAL_CAPSULE | Freq: Every day | ORAL | Status: DC
  Filled 2018-10-13 (×2): qty 14

## 2018-10-13 MED ORDER — VENLAFAXINE HCL ER 75 MG PO CP24: 75.0000 mg | ORAL_CAPSULE | Freq: Every day | ORAL | 0 refills | Status: DC

## 2018-10-13 MED ORDER — ALBUTEROL SULFATE HFA 108 (90 BASE) MCG/ACT IN AERS: 1.0000 | INHALATION_SPRAY | Freq: Four times a day (QID) | RESPIRATORY_TRACT | 0 refills | Status: DC | PRN
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
## 2018-10-13 NOTE — Plan of Care (Signed)
  Problem: Education: Goal: Knowledge of [facility] Education information/materials will improve Outcome: Adequate for Discharge   Problem: Education: Goal: Emotional status will improve Outcome: Adequate for Discharge   Problem: Education: Goal: Mental status will improve Outcome: Adequate for Discharge   Problem: Education: Goal: Verbalization of understanding the information provided will improve Outcome: Adequate for Discharge   

## 2018-10-13 NOTE — Progress Notes (Signed)
DAR NOTE: Pt present with bright affect and jovial mood in the unit. . Pt denies physical pain, took  her meds as scheduled.  Pt's safety ensured with 15 minute and environmental checks. Pt currently denies SI/HI and A/V hallucinations. Pt verbally agrees to seek staff if SI/HI or A/VH occurs and to consult with staff before acting on these thoughts. Will continue POC.

## 2018-10-13 NOTE — Progress Notes (Signed)
  [facility] Adult Case Management Discharge Plan :  Will you be returning to the same living situation after discharge:  No. Moving in with fiance in [place], [place]. At discharge, do you have transportation home?: Yes,  mother is picking up  Do you have the ability to pay for your medications: No. Referred to community based mental health.  Release of information consent forms completed and in the chart. School letters and outpatient mental health information are on patient's chart.   Patient to Follow up at: Follow-up Information    [facility] Follow up.   Why:  Please call to schedule follow up services. Appointments will be conducted by phone. Contact information: Address: [address] [place], [place] [address]  Phone: [phone]            Next level of care provider has access to [facility] Link:no  Safety Planning and Suicide Prevention discussed: Yes,  with patient, attempted to reach fiance twice  Have you used any form of tobacco in the last 30 days? (Cigarettes, Smokeless Tobacco, Cigars, and/or Pipes): Yes  Has patient been referred to the Quitline?: Patient refused referral  Patient has been referred for addiction treatment: Yes  Darreld Mclean, LCSWA 10/13/2018, 9:10 AM

## 2018-10-13 NOTE — Progress Notes (Signed)
Discharge note: Patient reviewed discharge paperwork with RN including prescriptions, follow up appointments, and lab work. Patient given the opportunity to ask questions. All concerns were addressed. All belongings were returned to patient. Denied SI/HI/AVH. Patient thanked staff for their care while at the hospital.  Patient was discharged to the lobby where her mother was waiting to pick her up. 

## 2018-10-13 NOTE — Progress Notes (Signed)
Pt said her day was a 10. The one positive thing that happen to her she goes home tomorrow.

## 2018-10-13 NOTE — [facility] Suicide Risk Assessment (Signed)
[facility] Discharge Suicide Risk Assessment   Principal Problem: Bipolar disorder [facility]) Discharge Diagnoses: Principal Problem:   Bipolar disorder (HCC) Active Problems:   Intentional drug overdose (HCC)   Total Time spent with patient: 30 minutes  Musculoskeletal: Strength & Muscle Tone: within normal limits Gait & Station: normal Patient leans: N/A  Psychiatric Specialty Exam: ROS no headache, no chest pain, no shortness of breath, no cough, no fever, no chills   Blood pressure 110/80, pulse (!) 59, temperature 97.9 F (36.6 C), temperature source Oral, resp. rate 16, height 5\' 5"  (1.651 m), weight 79.8 kg.Body mass index is 29.29 kg/m.  General Appearance: improved grooming  Eye Contact::  Good  Speech:  Normal Rate409  Volume:  Normal  Mood:  reports mood is " a lot better" and presents euthymic at this time  Affect:  Appropriate and Full Range  Thought Process:  Linear and Descriptions of Associations: Intact  Orientation:  Full (Time, Place, and Person)  Thought Content:  no hallucinations, no delusions  Suicidal Thoughts:  No denies suicidal or self injurious ideations, no homicidal or violent ideations  Homicidal Thoughts:  No  Memory:  recent and remote grossly intact   Judgement:  Other:  improving  Insight:  improving  Psychomotor Activity:  Normal  Concentration:  Good  Recall:  Good  Fund of Knowledge:Good  Language: Good  Akathisia:  Negative  Handed:  Right  AIMS (if indicated):     Assets:  Communication Skills Desire for Improvement Resilience  Sleep:  Number of Hours: 6.75  Cognition: WNL  ADL's:  Intact   Mental Status Per Nursing Assessment::   On Admission:  Suicidal ideation indicated by patient, Self-harm behaviors  Demographic Factors:  22, single, no children, plans to return to [facility], to live with her fiance   Loss Factors: Had recently come to [place] to spend time with mother-strained relationship, unstable housing   Historical  Factors: History of prior psychiatric admissions, most recently 2018. History of prior suicide attempt by overdose  Risk Reduction Factors:   Sense of responsibility to family, Living with another person, especially a relative and Positive coping skills or problem solving skills  Continued Clinical Symptoms:  Currently reports feeling much better than on admission and presents euthymic, with full range of affect. Denies SI or HI, presents future oriented .No psychotic symptoms. Behavior on unit in good control, interactive with peers, pleasant on approach Denies medication side effects. Side effects discussed including risk of suicidal ideations early in treatment with antidepressants in young adults   Cognitive Features That Contribute To Risk:  No gross cognitive deficits noted upon discharge. Is alert , attentive, and oriented x 3   Suicide Risk:  Mild:  Suicidal ideation of limited frequency, intensity, duration, and specificity.  There are no identifiable plans, no associated intent, mild dysphoria and related symptoms, good self-control (both objective and subjective assessment), few other risk factors, and identifiable protective factors, including available and accessible social support.  Follow-up Information    [facility] Follow up.   Why:  Please call to schedule follow up services. Appointments will be conducted by phone. Contact information: Address: [address] [place], [place] [address]  Phone: [phone]            Plan Of Care/Follow-up recommendations:  Activity:  as tolerated  Diet:  regular Tests:  NA Other:  See below  She is expressing readiness for discharge and leaving in good spirits  Patient  plans to return to [place], [place], to live with her fiance. States she plans to get psychiatric services locally in the [place] area.   Craige CottaFernando A , MD 10/13/2018, 9:54 AM

## 2018-10-13 NOTE — [facility] Suicide Risk Assessment (Signed)
[facility] INPATIENT:  Family/Significant Other Suicide Prevention Education  Suicide Prevention Education:  Contact Attempts: Jamesetta So [phone]  has been identified by the patient as the family member/significant other with whom the patient will be residing, and identified as the person(s) who will aid the patient in the event of a mental health crisis.  With written consent from the patient, two attempts were made to provide suicide prevention education, prior to and/or following the patient's discharge.  We were unsuccessful in providing suicide prevention education.  A suicide education pamphlet was given to the patient to share with family/significant other.  Date and time of first attempt: 10/12/2018 at 10:08am. Left a HIPAA compliant VM Date and time of second attempt: 10/13/2018 at 9:07am.  Darreld Mclean 10/13/2018, 9:09 AM

## 2018-11-02 ENCOUNTER — Encounter (HOSPITAL_COMMUNITY): Payer: Self-pay | Admitting: *Deleted

## 2018-11-02 ENCOUNTER — Other Ambulatory Visit: Payer: Self-pay

## 2018-11-02 ENCOUNTER — Emergency Department (HOSPITAL_COMMUNITY)
Admission: EM | Admit: 2018-11-02 | Discharge: 2018-11-02 | Disposition: A | Discharge status: Home / Self Care | Payer: Self-pay | Attending: Emergency Medicine | Admitting: Emergency Medicine

## 2018-11-02 ENCOUNTER — Emergency Department (HOSPITAL_COMMUNITY): Payer: Self-pay

## 2018-11-02 DIAGNOSIS — R109 Unspecified abdominal pain: Secondary | ICD-10-CM

## 2018-11-02 DIAGNOSIS — M545 Low back pain: Secondary | ICD-10-CM | POA: Insufficient documentation

## 2018-11-02 DIAGNOSIS — R112 Nausea with vomiting, unspecified: Secondary | ICD-10-CM | POA: Insufficient documentation

## 2018-11-02 DIAGNOSIS — R110 Nausea: Secondary | ICD-10-CM

## 2018-11-02 DIAGNOSIS — R079 Chest pain, unspecified: Secondary | ICD-10-CM | POA: Insufficient documentation

## 2018-11-02 DIAGNOSIS — R1084 Generalized abdominal pain: Secondary | ICD-10-CM | POA: Insufficient documentation

## 2018-11-02 DIAGNOSIS — F17210 Nicotine dependence, cigarettes, uncomplicated: Secondary | ICD-10-CM | POA: Insufficient documentation

## 2018-11-02 DIAGNOSIS — Z79899 Other long term (current) drug therapy: Secondary | ICD-10-CM | POA: Insufficient documentation

## 2018-11-02 DIAGNOSIS — J45909 Unspecified asthma, uncomplicated: Secondary | ICD-10-CM | POA: Insufficient documentation

## 2018-11-02 LAB — CBC
HCT: 41.5 % (ref 36.0–46.0)
Hemoglobin: 13.7 g/dL (ref 12.0–15.0)
MCH: 31.6 pg (ref 26.0–34.0)
MCHC: 33 g/dL (ref 30.0–36.0)
MCV: 95.8 fL (ref 80.0–100.0)
Platelets: 220 10*3/uL (ref 150–400)
RBC: 4.33 MIL/uL (ref 3.87–5.11)
RDW: 13.2 % (ref 11.5–15.5)
WBC: 5.8 10*3/uL (ref 4.0–10.5)
nRBC: 0 % (ref 0.0–0.2)

## 2018-11-02 LAB — URINALYSIS, ROUTINE W REFLEX MICROSCOPIC
Bilirubin Urine: NEGATIVE
Glucose, UA: NEGATIVE mg/dL
Hgb urine dipstick: NEGATIVE
Ketones, ur: NEGATIVE mg/dL
Leukocytes,Ua: NEGATIVE
Nitrite: NEGATIVE
Protein, ur: NEGATIVE mg/dL
Specific Gravity, Urine: 1.015 (ref 1.005–1.030)
pH: 6 (ref 5.0–8.0)

## 2018-11-02 LAB — COMPREHENSIVE METABOLIC PANEL
ALT: 21 U/L (ref 0–44)
AST: 16 U/L (ref 15–41)
Albumin: 4 g/dL (ref 3.5–5.0)
Alkaline Phosphatase: 52 U/L (ref 38–126)
Anion gap: 11 (ref 5–15)
BUN: 8 mg/dL (ref 6–20)
CO2: 25 mmol/L (ref 22–32)
Calcium: 10.8 mg/dL — ABNORMAL HIGH (ref 8.9–10.3)
Chloride: 103 mmol/L (ref 98–111)
Creatinine, Ser: 0.73 mg/dL (ref 0.44–1.00)
GFR calc Af Amer: >60 mL/min (ref >60)
GFR calc non Af Amer: >60 mL/min (ref >60)
Glucose, Bld: 103 mg/dL — ABNORMAL HIGH (ref 70–99)
Potassium: 3.8 mmol/L (ref 3.5–5.1)
Sodium: 139 mmol/L (ref 135–145)
Total Bilirubin: 1.1 mg/dL (ref 0.3–1.2)
Total Protein: 7.2 g/dL (ref 6.5–8.1)

## 2018-11-02 LAB — I-STAT BETA HCG BLOOD, ED (MC, WL, AP ONLY): I-stat hCG, quantitative: <5 m[IU]/mL (ref <5)

## 2018-11-02 LAB — LIPASE, BLOOD: Lipase: 26 U/L (ref 11–51)

## 2018-11-02 MED ORDER — SODIUM CHLORIDE 0.9% FLUSH: 3.0000 mL | Freq: Once | INTRAVENOUS | Status: DC

## 2018-11-02 MED ORDER — ONDANSETRON HCL 4 MG PO TABS: 4.0000 mg | ORAL_TABLET | Freq: Four times a day (QID) | ORAL | 0 refills | Status: DC

## 2018-11-02 MED ORDER — ONDANSETRON 4 MG PO TBDP
4.0000 mg | ORAL_TABLET | Freq: Once | ORAL | Status: AC
  Administered 2018-11-02: 4 mg via ORAL
  Filled 2018-11-02: qty 1

## 2018-11-02 NOTE — Discharge Instructions (Signed)
Thank you for allowing me to care for you today. Please return to the emergency department if you have new or worsening symptoms. Take your medications as instructed.  ° °

## 2018-11-02 NOTE — ED Notes (Signed)
[phone] [place] Smoker, mother, please call with updates

## 2018-11-02 NOTE — ED Provider Notes (Signed)
[facility] EMERGENCY DEPARTMENT Provider Note   CSN: [id number] Arrival date & time: 11/02/18  1607    History   Chief Complaint Chief Complaint  Patient presents with  . Nausea  . Back Pain    HPI Jenna Henry is a 23 y.o. female.     Patient is a 23 year old female with past medical history of anxiety, asthma presenting to the emergency department for abdominal pain, chest pain, nausea.  This is been going on for about 3 or 4 days.  Reports that she has had a couple episodes of nonbloody vomiting.  Had a normal bowel movement yesterday.  No dysuria, hematuria, vaginal discharge.  Reports that the chest pain feels tight and is constant and occasionally squeezes tighter without any specific exacerbating or relieving factors.  Her abdominal pain is generalized and nonspecific.  Denies any coughing, sick contacts.  She is still able to eat and drink.     Past Medical History:  Diagnosis Date  . Anxiety   . Asthma   . Depression   . Herpes   . Polycystic ovaries     Patient Active Problem List   Diagnosis Date Noted  . Bipolar disorder (HCC) 10/11/2018  . MDD (major depressive disorder) 10/10/2018  . Intentional drug overdose (HCC) 10/09/2018  . Hypokalemia 10/09/2018  . 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 Palpitations 11/09/2017  . Chest pain 11/09/2017  . Cigarette smoker 11/09/2017  . Severe major depression without psychotic features (HCC) 02/28/2017  . 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 Suicide attempt (HCC) 02/28/2017  . Anxiety state 02/28/2017  . Insomnia 02/28/2017  . Herpes 11/12/2016  . Irregular menstruation 07/06/2016    Past Surgical History:  Procedure Laterality Date  . CYSTECTOMY       OB History   No obstetric history on file.      Home Medications    Prior to Admission medications   Medication Sig Start Date End Date Taking? Authorizing Provider  albuterol  (VENTOLIN HFA) 108 (90 Base) MCG/ACT inhaler Inhale 1-2 puffs into the lungs every 6 (six) hours as needed for wheezing or shortness of breath. 10/13/18   Money, Gerlene Burdock, FNP  ondansetron (ZOFRAN) 4 MG tablet Take 1 tablet (4 mg total) by mouth every 6 (six) hours. 11/02/18   Arlyn Dunning, PA-C  venlafaxine XR (EFFEXOR-XR) 75 MG 24 hr capsule Take 1 capsule (75 mg total) by mouth daily with breakfast. 10/14/18   Money, Gerlene Burdock, FNP    Family History Family History  Problem Relation Age of Onset  . Hypertension Mother   . Heart murmur Father   . Heart disease Maternal Grandfather   . Diabetes Paternal Grandmother   . Cancer Paternal Grandmother     Social History Social History   Tobacco Use  . Smoking status: Current Some Day Smoker    Packs/day: 1.00  . Smokeless tobacco: Never Used  . Tobacco comment: pt reports smokeing 1 Black and mild daily  Substance Use Topics  . Alcohol use: No  . Drug use: Yes    Types: Marijuana     Allergies   Ortho tri-cyclen [norgestimate-eth estradiol]   Review of Systems Review of Systems  Constitutional: Negative for activity change, appetite change and fever.  HENT: Negative for congestion and sinus pain.   Respiratory: Positive for chest tightness. Negative for cough and shortness of breath.   Cardiovascular: Positive for chest pain. Negative for palpitations and leg swelling.  Gastrointestinal: Positive for abdominal pain, nausea and vomiting. Negative for anal bleeding, blood in stool and constipation.  Genitourinary: Negative for dysuria, menstrual problem, pelvic pain, urgency, vaginal bleeding and vaginal discharge.  Musculoskeletal: Positive for back pain.  Skin: Negative for rash and wound.  Allergic/Immunologic: Negative for immunocompromised state.  Neurological: Negative for dizziness, light-headedness and headaches.     Physical Exam Updated Vital Signs BP 114/77 (BP Location: Right Arm)   Pulse (!) 57   Temp 98.6 F  (37 C) (Oral)   Resp 12   LMP 10/09/2018   SpO2 94%   Physical Exam Vitals signs and nursing note reviewed.  Constitutional:      Appearance: Normal appearance.  HENT:     Head: Normocephalic.  Eyes:     Conjunctiva/sclera: Conjunctivae normal.  Cardiovascular:     Rate and Rhythm: Normal rate and regular rhythm.  Pulmonary:     Effort: Pulmonary effort is normal.     Breath sounds: Normal breath sounds.  Abdominal:     General: Abdomen is flat. Bowel sounds are normal. There is no distension.     Palpations: There is no mass.     Tenderness: There is abdominal tenderness (Generalized). There is no guarding or rebound.     Hernia: No hernia is present.  Skin:    General: Skin is dry.  Neurological:     Mental Status: She is alert.  Psychiatric:        Mood and Affect: Mood normal.      ED Treatments / Results  Labs (all labs ordered are listed, but only abnormal results are displayed) Labs Reviewed  COMPREHENSIVE METABOLIC PANEL - Abnormal; Notable for the following components:      Result Value   Glucose, Bld 103 (*)    Calcium 10.8 (*)    All other components within normal limits  URINALYSIS, ROUTINE W REFLEX MICROSCOPIC - Abnormal; Notable for the following components:   APPearance HAZY (*)    All other components within normal limits  LIPASE, BLOOD  CBC  I-STAT BETA HCG BLOOD, ED ([facility], [facility], [facility] ONLY)    EKG None  Radiology Dg Abdomen Acute W/chest  Result Date: 11/02/2018 CLINICAL DATA:  Acute chest and abdominal pain. EXAM: DG ABDOMEN ACUTE W/ 1V CHEST COMPARISON:  Radiographs of January 11, 2018. FINDINGS: There is no evidence of dilated bowel loops or free intraperitoneal air. No radiopaque calculi or other significant radiographic abnormality is seen. Heart size and mediastinal contours are within normal limits. Both lungs are clear. IMPRESSION: No evidence of bowel obstruction or ileus. No acute cardiopulmonary disease. Electronically Signed   By: Lupita Raider M.D.   On: 11/02/2018 11:31    Procedures Procedures (including critical care time)  Medications Ordered in ED Medications  sodium chloride flush (NS) 0.9 % injection 3 mL (has no administration in time range)  ondansetron (ZOFRAN-ODT) disintegrating tablet 4 mg (4 mg Oral Given 11/02/18 1101)     Initial Impression / Assessment and Plan / ED Course  I have reviewed the triage vital signs and the nursing notes.  Pertinent labs & imaging results that were available during my care of the patient were reviewed by me and considered in my medical decision making (see chart for details).  Clinical Course as of Nov 01 1136  Wed Nov 02, 2018  1134 Likely viral etiology given her symptoms.  Labs and acute abdominal x-ray series are reassuring.  Patient given Zofran with relief.  Follow-up  with primary care doctor.   [KM]    Clinical Course User Index [KM] Arlyn DunningMcLean, Bradly Sangiovanni A, PA-C       Based on review of vitals, medical screening exam, lab work and/or imaging, there does not appear to be an acute, emergent etiology for the patient's symptoms. Counseled pt on good return precautions and encouraged both PCP and ED follow-up as needed.  Prior to discharge, I also discussed incidental imaging findings with patient in detail and advised appropriate, recommended follow-up in detail.  Clinical Impression: 1. Nausea   2. Abdominal pain, unspecified abdominal location     Disposition: Discharge  Prior to providing a prescription for a controlled substance, I independently reviewed the patient's recent prescription history on the [place] Controlled Substance Reporting System. The patient had no recent or regular prescriptions and was deemed appropriate for a brief, less than 3 day prescription of narcotic for acute analgesia.  This note was prepared with assistance of Conservation officer, historic buildingsDragon voice recognition software. Occasional wrong-word or sound-a-like substitutions may have occurred due to the  inherent limitations of voice recognition software.   Final Clinical Impressions(s) / ED Diagnoses   Final diagnoses:  Nausea  Abdominal pain, unspecified abdominal location    ED Discharge Orders         Ordered    ondansetron (ZOFRAN) 4 MG tablet  Every 6 hours     11/02/18 1138           Jeral PinchMcLean, Mycah Mcdougall A, PA-C 11/02/18 1138    Terrilee FilesButler, Michael C, MD 11/02/18 [id number]

## 2018-11-02 NOTE — ED Triage Notes (Signed)
To ED for eval of lower abd pain and lower back pain for the past few days. No fevers. No diarrhea. States she is urinating more than normal but no pain. No vaginal discharge.

## 2018-11-11 ENCOUNTER — Ambulatory Visit (HOSPITAL_COMMUNITY)
Admission: EM | Admit: 2018-11-11 | Discharge: 2018-11-11 | Disposition: A | Discharge status: Home / Self Care | Payer: Medicaid Other | Attending: Family Medicine | Admitting: Family Medicine

## 2018-11-11 ENCOUNTER — Other Ambulatory Visit: Payer: Self-pay

## 2018-11-11 ENCOUNTER — Encounter (HOSPITAL_COMMUNITY): Payer: Self-pay | Admitting: Emergency Medicine

## 2018-11-11 DIAGNOSIS — R1084 Generalized abdominal pain: Secondary | ICD-10-CM

## 2018-11-11 DIAGNOSIS — G8929 Other chronic pain: Secondary | ICD-10-CM

## 2018-11-11 LAB — POCT URINALYSIS DIP (DEVICE)
Bilirubin Urine: NEGATIVE
Glucose, UA: NEGATIVE mg/dL
Ketones, ur: NEGATIVE mg/dL
Leukocytes,Ua: NEGATIVE
Nitrite: NEGATIVE
Protein, ur: NEGATIVE mg/dL
Specific Gravity, Urine: >=1.03 (ref 1.005–1.030)
Urobilinogen, UA: 0.2 mg/dL (ref 0.0–1.0)
pH: 5.5 (ref 5.0–8.0)

## 2018-11-11 MED ORDER — OMEPRAZOLE 20 MG PO CPDR: 20.0000 mg | DELAYED_RELEASE_CAPSULE | Freq: Every day | ORAL | 0 refills | Status: DC

## 2018-11-11 MED ORDER — NAPROXEN 500 MG PO TABS: 500.0000 mg | ORAL_TABLET | Freq: Two times a day (BID) | ORAL | 0 refills | Status: DC

## 2018-11-11 NOTE — ED Triage Notes (Signed)
Pt c/o lower abdominal pain for three months, was seen at the ER last week for same, unable to find the cause for pain. Pt states her periods are irregular and also feels like its pregnancy symptoms but has had negative tests.

## 2018-11-11 NOTE — ED Provider Notes (Addendum)
[facility]    CSN: [id number] Arrival date & time: 11/11/18  1703     History   Chief Complaint Chief Complaint  Patient presents with  . Abdominal Pain    HPI Jenna Henry is a 23 y.o. female.   HPI  23 year old patient who is been to the ER or urgent care center 20 times in the last 3 years with complaints of abdominal pain.  She has been told that she has acid reflux, chronic constipation, polycystic ovarian syndrome, chronic pelvic pain, IBS.  She is been treated with a multiple of medications.  It is been recommended to her multiple times that she needs to see GYN or gastroenterology.  She does not have insurance.  She does have a PCP.  She does not really have an avenue for getting in with a specialist. Today she has lower abdominal cramping.  Some spotting and bleeding.  Going on for 2 weeks.  No nausea vomiting.  No fever.  No diarrhea.  She has not taken anything but a few Advil.  This is not helped her.  Was just evaluated in the emergency room a couple days ago with blood work x-rays and testing.  Everything was negative.  Pregnancy test negative.  Past Medical History:  Diagnosis Date  . Anxiety   . Asthma   . Depression   . Herpes   . Polycystic ovaries     Patient Active Problem List   Diagnosis Date Noted  . Bipolar disorder (HCC) 10/11/2018  . MDD (major depressive disorder) 10/10/2018  . Intentional drug overdose (HCC) 10/09/2018  . Hypokalemia 10/09/2018  . 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 Palpitations 11/09/2017  . Chest pain 11/09/2017  . Cigarette smoker 11/09/2017  . Severe major depression without psychotic features (HCC) 02/28/2017  . 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 Suicide attempt (HCC) 02/28/2017  . Anxiety state 02/28/2017  . Insomnia 02/28/2017  . Herpes 11/12/2016  . Irregular menstruation 07/06/2016    Past Surgical History:  Procedure Laterality Date  .  CYSTECTOMY      OB History   No obstetric history on file.      Home Medications    Prior to Admission medications   Medication Sig Start Date End Date Taking? Authorizing Provider  albuterol (VENTOLIN HFA) 108 (90 Base) MCG/ACT inhaler Inhale 1-2 puffs into the lungs every 6 (six) hours as needed for wheezing or shortness of breath. 10/13/18   Money, Gerlene Burdockravis B, FNP  naproxen (NAPROSYN) 500 MG tablet Take 1 tablet (500 mg total) by mouth 2 (two) times daily. Take with food for abdominal pain and cramping 11/11/18   Eustace MooreNelson, Alfonsa Vaile Sue, MD  omeprazole (PRILOSEC) 20 MG capsule Take 1 capsule (20 mg total) by mouth daily. 11/11/18   Eustace MooreNelson, Camia Dipinto Sue, MD  ondansetron (ZOFRAN) 4 MG tablet Take 1 tablet (4 mg total) by mouth every 6 (six) hours. 11/02/18   Arlyn DunningMcLean, Kelly A, PA-C  venlafaxine XR (EFFEXOR-XR) 75 MG 24 hr capsule Take 1 capsule (75 mg total) by mouth daily with breakfast. 10/14/18   Money, Gerlene Burdockravis B, FNP    Family History Family History  Problem Relation Age of Onset  . Hypertension Mother   . Heart murmur Father   . Heart disease Maternal Grandfather   . Diabetes Paternal Grandmother   . Cancer Paternal Grandmother     Social History Social History   Tobacco Use  . Smoking status: Current Some Day Smoker  Packs/day: 1.00  . Smokeless tobacco: Never Used  . Tobacco comment: pt reports smokeing 1 Black and mild daily  Substance Use Topics  . Alcohol use: No  . Drug use: Yes    Types: Marijuana     Allergies   Ortho tri-cyclen [norgestimate-eth estradiol]   Review of Systems Review of Systems  Constitutional: Negative for chills and fever.  HENT: Negative for ear pain and sore throat.   Eyes: Negative for pain and visual disturbance.  Respiratory: Negative for cough and shortness of breath.   Cardiovascular: Negative for chest pain and palpitations.  Gastrointestinal: Positive for abdominal pain. Negative for vomiting.  Genitourinary: Positive for pelvic pain  and vaginal bleeding. Negative for dysuria and hematuria.  Musculoskeletal: Negative for arthralgias and back pain.  Skin: Negative for color change and rash.  Neurological: Negative for seizures and syncope.  All other systems reviewed and are negative.    Physical Exam Triage Vital Signs ED Triage Vitals  Enc Vitals Group     BP 11/11/18 1722 124/70     Pulse Rate 11/11/18 1722 82     Resp 11/11/18 1722 18     Temp 11/11/18 1722 99 F (37.2 C)     Temp src --      SpO2 11/11/18 1722 100 %     Weight --      Height --      Head Circumference --      Peak Flow --      Pain Score 11/11/18 1723 9     Pain Loc --      Pain Edu? --      Excl. in [place]? --    No data found.  Updated Vital Signs BP 124/70   Pulse 82   Temp 99 F (37.2 C)   Resp 18   SpO2 100%      Physical Exam Constitutional:      General: She is not in acute distress.    Appearance: She is well-developed.     Comments: Overweight  HENT:     Head: Normocephalic and atraumatic.     Mouth/Throat:     Mouth: Mucous membranes are moist.  Eyes:     Conjunctiva/sclera: Conjunctivae normal.     Pupils: Pupils are equal, round, and reactive to light.  Neck:     Musculoskeletal: Normal range of motion.  Cardiovascular:     Rate and Rhythm: Normal rate and regular rhythm.     Heart sounds: Normal heart sounds.  Pulmonary:     Effort: Pulmonary effort is normal. No respiratory distress.     Breath sounds: Normal breath sounds.  Abdominal:     General: Abdomen is protuberant. Bowel sounds are normal. There is no distension.     Palpations: Abdomen is soft. There is no hepatomegaly, splenomegaly or mass.     Tenderness: There is generalized abdominal tenderness.     Hernia: No hernia is present.  Musculoskeletal: Normal range of motion.  Skin:    General: Skin is warm and dry.  Neurological:     General: No focal deficit present.     Mental Status: She is alert.      UC Treatments / Results  Labs  (all labs ordered are listed, but only abnormal results are displayed) Labs Reviewed  POCT URINALYSIS DIP (DEVICE) - Abnormal; Notable for the following components:      Result Value   Hgb urine dipstick TRACE (*)    All other components  within normal limits    EKG None  Radiology No results found.  Procedures Procedures (including critical care time)  Medications Ordered in UC Medications - No data to display  Initial Impression / Assessment and Plan / UC Course  I have reviewed the triage vital signs and the nursing notes.  Pertinent labs & imaging results that were available during my care of the patient were reviewed by me and considered in my medical decision making (see chart for details).     We discussed that all of the diagnoses she has received her chronic medical problem.  We discussed the problem of trying to solve chronic problems in an emergency room or urgent care center where the doctor only has a snapshot of what is going on with her and avoid ER visits to go on.  She really needs to see a primary care doctor on a regular basis who can coordinate her care and refer her to appropriate specialists. Today she does not have an acute abdomen.  I am giving her Naprosyn for her cramping.  Omeprazole to Protect her stomach.  And the recommendation to follow-up with her primary care doctor. Final Clinical Impressions(s) / UC Diagnoses   Final diagnoses:  Chronic generalized abdominal pain     Discharge Instructions     Naprosyn 2 times a day as needed for abdominal pain and cramping Take omeprazole once a day to help protect your stomach, reduce heartburn Follow-up with a primary care doctor    ED Prescriptions    Medication Sig Dispense Auth. Provider   naproxen (NAPROSYN) 500 MG tablet Take 1 tablet (500 mg total) by mouth 2 (two) times daily. Take with food for abdominal pain and cramping 30 tablet Raylene Everts, MD   omeprazole (PRILOSEC) 20 MG capsule  Take 1 capsule (20 mg total) by mouth daily. 30 capsule Raylene Everts, MD     Controlled Substance Prescriptions [place] Controlled Substance Registry consulted? Not Applicable   Raylene Everts, MD 11/11/18 1751    Raylene Everts, MD 11/11/18 Carollee Massed

## 2018-11-11 NOTE — Discharge Instructions (Addendum)
Naprosyn 2 times a day as needed for abdominal pain and cramping Take omeprazole once a day to help protect your stomach, reduce heartburn Follow-up with a primary care doctor

## 2019-02-15 ENCOUNTER — Other Ambulatory Visit: Payer: Self-pay

## 2019-02-15 ENCOUNTER — Ambulatory Visit (HOSPITAL_COMMUNITY)
Admission: EM | Admit: 2019-02-15 | Discharge: 2019-02-15 | Disposition: A | Discharge status: Home / Self Care | Payer: Self-pay | Attending: Emergency Medicine | Admitting: Emergency Medicine

## 2019-02-15 ENCOUNTER — Encounter (HOSPITAL_COMMUNITY): Payer: Self-pay

## 2019-02-15 DIAGNOSIS — L0291 Cutaneous abscess, unspecified: Secondary | ICD-10-CM

## 2019-02-15 MED ORDER — SULFAMETHOXAZOLE-TRIMETHOPRIM 800-160 MG PO TABS: 1.0000 | ORAL_TABLET | Freq: Two times a day (BID) | ORAL | 0 refills | Status: AC

## 2019-02-15 NOTE — ED Triage Notes (Signed)
Patient presents to Urgent Care with complaints of draining abscess since 2 weeks ago. Patient reports it is under her left axilla, states this is the worst one she has had.

## 2019-02-15 NOTE — Discharge Instructions (Addendum)
Take the antibiotic as prescribed.    Keep the area clean and dry.  Wash it gently twice a day with soap and water.   Allow and encourage the drainage from the wound.   Return here if you see signs of infection, such as increased pain, redness, warmth, fever, chills, or other concerning symptoms.

## 2019-02-15 NOTE — ED Provider Notes (Signed)
[facility]    CSN: [id number] Arrival date & time: 02/15/19  1726      History   Chief Complaint Chief Complaint  Patient presents with  . Abscess    HPI Jenna Henry is a 23 y.o. female.   Patient presents with an abscess in her left axilla x2 weeks.  She reports small amount of purulent drainage in the last few days.  She denies fever, chills, erythema, streaks, or other symptoms.  LMP: 01/10/2019, patient has irregular cycles, she had a negative pregnancy test one week ago and has not had sex since then.  The history is provided by the patient.    Past Medical History:  Diagnosis Date  . Anxiety   . Asthma   . Depression   . Herpes   . Polycystic ovaries     Patient Active Problem List   Diagnosis Date Noted  . Bipolar disorder (HCC) 10/11/2018  . MDD (major depressive disorder) 10/10/2018  . Intentional drug overdose (HCC) 10/09/2018  . Hypokalemia 10/09/2018  . 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 Palpitations 11/09/2017  . Chest pain 11/09/2017  . Cigarette smoker 11/09/2017  . Severe major depression without psychotic features (HCC) 02/28/2017  . 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 Suicide attempt (HCC) 02/28/2017  . Anxiety state 02/28/2017  . Insomnia 02/28/2017  . Herpes 11/12/2016  . Irregular menstruation 07/06/2016    Past Surgical History:  Procedure Laterality Date  . CYSTECTOMY      OB History   No obstetric history on file.      Home Medications    Prior to Admission medications   Medication Sig Start Date End Date Taking? Authorizing Provider  albuterol (VENTOLIN HFA) 108 (90 Base) MCG/ACT inhaler Inhale 1-2 puffs into the lungs every 6 (six) hours as needed for wheezing or shortness of breath. 10/13/18   Money, Gerlene Burdockravis B, FNP  naproxen (NAPROSYN) 500 MG tablet Take 1 tablet (500 mg total) by mouth 2 (two) times daily. Take with food for abdominal pain and cramping  11/11/18   Eustace MooreNelson, Yvonne Sue, MD  omeprazole (PRILOSEC) 20 MG capsule Take 1 capsule (20 mg total) by mouth daily. 11/11/18   Eustace MooreNelson, Yvonne Sue, MD  ondansetron (ZOFRAN) 4 MG tablet Take 1 tablet (4 mg total) by mouth every 6 (six) hours. 11/02/18   Ronnie DossMcLean, Mikaiah Stoffer A, PA-C  sulfamethoxazole-trimethoprim (BACTRIM DS) 800-160 MG tablet Take 1 tablet by mouth 2 (two) times daily for 7 days. 02/15/19 02/22/19  Mickie Bailate, Cherene Dobbins H, NP  venlafaxine XR (EFFEXOR-XR) 75 MG 24 hr capsule Take 1 capsule (75 mg total) by mouth daily with breakfast. 10/14/18   Money, Gerlene Burdockravis B, FNP    Family History Family History  Problem Relation Age of Onset  . Hypertension Mother   . Anxiety disorder Mother   . Heart murmur Father   . Heart disease Maternal Grandfather   . Diabetes Paternal Grandmother   . Cancer Paternal Grandmother     Social History Social History   Tobacco Use  . Smoking status: Current Some Day Smoker    Packs/day: 0.50  . Smokeless tobacco: Never Used  . Tobacco comment: 4 cigarettes per day  Substance Use Topics  . Alcohol use: No  . Drug use: Yes    Types: Marijuana     Allergies   Ortho tri-cyclen [norgestimate-eth estradiol]   Review of Systems Review of Systems  Constitutional: Negative for chills and fever.  HENT: Negative for ear  pain and sore throat.   Eyes: Negative for pain and visual disturbance.  Respiratory: Negative for cough and shortness of breath.   Cardiovascular: Negative for chest pain and palpitations.  Gastrointestinal: Negative for abdominal pain and vomiting.  Genitourinary: Negative for dysuria and hematuria.  Musculoskeletal: Negative for arthralgias and back pain.  Skin: Positive for wound. Negative for color change and rash.  Neurological: Negative for seizures and syncope.  All other systems reviewed and are negative.    Physical Exam Triage Vital Signs ED Triage Vitals  Enc Vitals Group     BP      Pulse      Resp      Temp      Temp src       SpO2      Weight      Height      Head Circumference      Peak Flow      Pain Score      Pain Loc      Pain Edu?      Excl. in [place]?    No data found.  Updated Vital Signs BP (!) 100/56 (BP Location: Right Arm)   Pulse 70   Temp 98.5 F (36.9 C) (Oral)   Resp 16   SpO2 99%   Visual Acuity Right Eye Distance:   Left Eye Distance:   Bilateral Distance:    Right Eye Near:   Left Eye Near:    Bilateral Near:     Physical Exam Vitals signs and nursing note reviewed.  Constitutional:      General: She is not in acute distress.    Appearance: She is well-developed.  HENT:     Head: Normocephalic and atraumatic.  Eyes:     Conjunctiva/sclera: Conjunctivae normal.  Neck:     Musculoskeletal: Neck supple.  Cardiovascular:     Rate and Rhythm: Normal rate and regular rhythm.     Heart sounds: No murmur.  Pulmonary:     Effort: Pulmonary effort is normal. No respiratory distress.     Breath sounds: Normal breath sounds.  Abdominal:     Palpations: Abdomen is soft.     Tenderness: There is no abdominal tenderness.  Skin:    General: Skin is warm and dry.     Findings: Lesion present.     Comments: 3 cm area of induration in left axilla with central open lesion with small amount of purulent drainage.  No erythema or streaks.    Neurological:     Mental Status: She is alert.      UC Treatments / Results  Labs (all labs ordered are listed, but only abnormal results are displayed) Labs Reviewed - No data to display  EKG   Radiology No results found.  Procedures Procedures (including critical care time)  Medications Ordered in UC Medications - No data to display  Initial Impression / Assessment and Plan / UC Course  I have reviewed the triage vital signs and the nursing notes.  Pertinent labs & imaging results that were available during my care of the patient were reviewed by me and considered in my medical decision making (see chart for details).     Abscess on left axilla.  Wound is already open and draining.  Treating with Septra DS.  Wound care instructions and signs of infection discussed at length with patient.  Instructed patient to return here if she notes signs of infection.  Patient agrees with plan of care.  Final Clinical Impressions(s) / UC Diagnoses   Final diagnoses:  Abscess     Discharge Instructions     Take the antibiotic as prescribed.    Keep the area clean and dry.  Wash it gently twice a day with soap and water.   Allow and encourage the drainage from the wound.   Return here if you see signs of infection, such as increased pain, redness, warmth, fever, chills, or other concerning symptoms.         ED Prescriptions    Medication Sig Dispense Auth. Provider   sulfamethoxazole-trimethoprim (BACTRIM DS) 800-160 MG tablet Take 1 tablet by mouth 2 (two) times daily for 7 days. 14 tablet Mickie Bail, NP     Controlled Substance Prescriptions [place] Controlled Substance Registry consulted? Not Applicable   Mickie Bail, NP 02/15/19 1755

## 2019-02-22 ENCOUNTER — Encounter: Payer: Self-pay | Admitting: Medical

## 2019-02-22 ENCOUNTER — Other Ambulatory Visit (HOSPITAL_COMMUNITY)
Admission: RE | Admit: 2019-02-22 | Discharge: 2019-02-22 | Disposition: A | Discharge status: Home / Self Care | Payer: Medicaid Other | Source: Ambulatory Visit | Attending: Medical | Admitting: Medical

## 2019-02-22 ENCOUNTER — Ambulatory Visit (INDEPENDENT_AMBULATORY_CARE_PROVIDER_SITE_OTHER): Payer: Self-pay | Admitting: Medical

## 2019-02-22 ENCOUNTER — Other Ambulatory Visit: Payer: Self-pay

## 2019-02-22 VITALS — BP 118/71 | HR 72 | Temp 98.7°F | Ht 65.0 in | Wt 183.4 lb

## 2019-02-22 DIAGNOSIS — B9689 Other specified bacterial agents as the cause of diseases classified elsewhere: Secondary | ICD-10-CM

## 2019-02-22 DIAGNOSIS — N939 Abnormal uterine and vaginal bleeding, unspecified: Secondary | ICD-10-CM | POA: Insufficient documentation

## 2019-02-22 DIAGNOSIS — N760 Acute vaginitis: Secondary | ICD-10-CM

## 2019-02-22 NOTE — Patient Instructions (Signed)
Polycystic Ovarian Syndrome  Polycystic ovarian syndrome (PCOS) is a common hormonal disorder among women of reproductive age. In most women with PCOS, many small fluid-filled sacs (cysts) grow on the ovaries, and the cysts are not part of a normal menstrual cycle. PCOS can cause problems with your menstrual periods and make it difficult to get pregnant. It can also cause an increased risk of miscarriage with pregnancy. If it is not treated, PCOS can lead to serious health problems, such as diabetes and heart disease. What are the causes? The cause of PCOS is not known, but it may be the result of a combination of certain factors, such as:  Irregular menstrual cycle.  High levels of certain hormones (androgens).  Problems with the hormone that helps to control blood sugar (insulin resistance).  Certain genes. What increases the risk? This condition is more likely to develop in women who have a family history of PCOS. What are the signs or symptoms? Symptoms of PCOS may include:  Multiple ovarian cysts.  Infrequent periods or no periods.  Periods that are too frequent or too heavy.  Unpredictable periods.  Inability to get pregnant (infertility) because of not ovulating.  Increased growth of hair on the face, chest, stomach, back, thumbs, thighs, or toes.  Acne or oily skin. Acne may develop during adulthood, and it may not respond to treatment.  Pelvic pain.  Weight gain or obesity.  Patches of thickened and dark brown or black skin on the neck, arms, breasts, or thighs (acanthosis nigricans).  Excess hair growth on the face, chest, abdomen, or upper thighs (hirsutism). How is this diagnosed? This condition is diagnosed based on:  Your medical history.  A physical exam, including a pelvic exam. Your health care provider may look for areas of increased hair growth on your skin.  Tests, such as: ? Ultrasound. This may be used to examine the ovaries and the lining of the  uterus (endometrium) for cysts. ? Blood tests. These may be used to check levels of sugar (glucose), female hormone (testosterone), and female hormones (estrogen and progesterone) in your blood. How is this treated? There is no cure for PCOS, but treatment can help to manage symptoms and prevent more health problems from developing. Treatment varies depending on:  Your symptoms.  Whether you want to have a baby or whether you need birth control (contraception). Treatment may include nutrition and lifestyle changes along with:  Progesterone hormone to start a menstrual period.  Birth control pills to help you have regular menstrual periods.  Medicines to make you ovulate, if you want to get pregnant.  Medicine to reduce excessive hair growth.  Surgery, in severe cases. This may involve making small holes in one or both of your ovaries. This decreases the amount of testosterone that your body produces. Follow these instructions at home:  Take over-the-counter and prescription medicines only as told by your health care provider.  Follow a healthy meal plan. This can help you reduce the effects of PCOS. ? Eat a healthy diet that includes lean proteins, complex carbohydrates, fresh fruits and vegetables, low-fat dairy products, and healthy fats. Make sure to eat enough fiber.  If you are overweight, lose weight as told by your health care provider. ? Losing 10% of your body weight may improve symptoms. ? Your health care provider can determine how much weight loss is best for you and can help you lose weight safely.  Keep all follow-up visits as told by your health care provider.   This is important. Contact a health care provider if:  Your symptoms do not get better with medicine.  You develop new symptoms. This information is not intended to replace advice given to you by your health care provider. Make sure you discuss any questions you have with your health care provider. Document  Released: 09/11/2004 Document Revised: 04/30/2017 Document Reviewed: 11/03/2015 Elsevier Patient Education  2020 Elsevier Inc.  

## 2019-02-22 NOTE — Progress Notes (Signed)
  History:  [name] is a 23 y.o. G0P0000 who presents to clinic today for evaluation of abdominal pain and irregular bleeding. The patient states diffuse lower abdominal pain since May. She has also not had a regular period since May. She was previously diagnosed with PCOS and had declined birth control. She had a short 2 day period in May and then had a heavy period in August and is currently having some off and on bleeding since Sunday evening. She has also noted increased urinary frequency, and increased frequency of BM from 2/day to 4-5/day for the last 2-3 weeks. She denies any recent dietary changes. She has noted increase in her pain.   The following portions of the patient's history were reviewed and updated as appropriate: allergies, current medications, family history, past medical history, social history, past surgical history and problem list.  Review of Systems:  Review of Systems  Constitutional: Negative for fever and malaise/fatigue.  Gastrointestinal: Positive for abdominal pain. Negative for constipation, diarrhea, nausea and vomiting.  Genitourinary: Negative for dysuria, frequency and urgency.       + vaginal bleeding Neg - vaginal discharge      Objective:  Physical Exam BP 118/71   Pulse 72   Temp 98.7 F (37.1 C)   Ht 5\' 5"  (1.651 m)   Wt 183 lb 6.4 oz (83.2 kg)   LMP 10/03/2018 (Approximate)   BMI 30.52 kg/m  Physical Exam  Nursing note and vitals reviewed. Constitutional: She is oriented to person, place, and time. She appears well-developed and well-nourished. No distress.  HENT:  Head: Normocephalic and atraumatic.  Cardiovascular: Normal rate.  Respiratory: Effort normal.  GI: Soft. She exhibits no distension and no mass. There is abdominal tenderness (mild diffuse lower abdominal tenderness to palpation). There is no rebound and no guarding.  Genitourinary: Uterus is not enlarged and not tender. Cervix exhibits no motion tenderness, no discharge  and no friability. Right adnexum displays tenderness. Right adnexum displays no mass. Left adnexum displays tenderness. Left adnexum displays no mass.    Vaginal bleeding (scant) present.     No vaginal discharge.  There is bleeding (scant) in the vagina.  Neurological: She is alert and oriented to person, place, and time.  Skin: Skin is warm and dry. No erythema.  Psychiatric: She has a normal mood and affect.    Assessment & Plan:  1. Abnormal uterine bleeding (AUB) - Cytology - PAP( [place]) - Cervicovaginal ancillary only( [place]) - HIV antibody (with reflex) - CBC - US PELVIC COMPLETE WITH TRANSVAGINAL; Future - Will follow-up with patient based on results. Patient would like to wait until lab results and [place] are final to discuss starting OCPs - She states her allergy to OCPs may have been more related to her laundry detergent so she would like to try them again if needed  - If labs and [place] are normal, consider GI referral for additional work-up for abdominal pain  - Patient may return to [place] as needed    Danielle Rankin 02/22/2019 3:29 PM

## 2019-02-23 LAB — CERVICOVAGINAL ANCILLARY ONLY
Bacterial Vaginitis (gardnerella): POSITIVE — AB
Candida Glabrata: NEGATIVE
Candida Vaginitis: NEGATIVE
Molecular Disclaimer: NEGATIVE
Molecular Disclaimer: NEGATIVE
Molecular Disclaimer: NEGATIVE
Molecular Disclaimer: NORMAL
Trichomonas: NEGATIVE

## 2019-02-23 LAB — HIV ANTIBODY (ROUTINE TESTING W REFLEX): HIV Screen 4th Generation wRfx: NONREACTIVE

## 2019-02-23 LAB — CBC
Hematocrit: 45.1 % (ref 34.0–46.6)
Hemoglobin: 15.2 g/dL (ref 11.1–15.9)
MCH: 31.3 pg (ref 26.6–33.0)
MCHC: 33.7 g/dL (ref 31.5–35.7)
MCV: 93 fL (ref 79–97)
Platelets: 260 10*3/uL (ref 150–450)
RBC: 4.86 x10E6/uL (ref 3.77–5.28)
RDW: 13.4 % (ref 11.7–15.4)
WBC: 5.7 10*3/uL (ref 3.4–10.8)

## 2019-02-24 LAB — CYTOLOGY - PAP
Chlamydia: NEGATIVE
Diagnosis: NEGATIVE
Molecular Disclaimer: NEGATIVE
Molecular Disclaimer: NORMAL
Neisseria Gonorrhea: NEGATIVE

## 2019-02-24 MED ORDER — METRONIDAZOLE 500 MG PO TABS: 500.0000 mg | ORAL_TABLET | Freq: Two times a day (BID) | ORAL | 0 refills | Status: DC

## 2019-02-24 NOTE — Addendum Note (Signed)
Addended by: Luvenia Redden on: 02/24/2019 08:26 AM   Modules accepted: Orders

## 2019-03-02 ENCOUNTER — Ambulatory Visit (HOSPITAL_COMMUNITY)
Admission: RE | Admit: 2019-03-02 | Discharge: 2019-03-02 | Disposition: A | Discharge status: Home / Self Care | Payer: Self-pay | Source: Ambulatory Visit | Attending: Medical | Admitting: Medical

## 2019-03-02 ENCOUNTER — Other Ambulatory Visit: Payer: Self-pay

## 2019-03-02 DIAGNOSIS — N939 Abnormal uterine and vaginal bleeding, unspecified: Secondary | ICD-10-CM | POA: Insufficient documentation

## 2019-03-07 ENCOUNTER — Telehealth: Payer: Self-pay | Admitting: Family Medicine

## 2019-03-07 ENCOUNTER — Ambulatory Visit (HOSPITAL_COMMUNITY): Admit: 2019-03-07 | Discharge: 2019-03-07 | Disposition: A | Discharge status: Home / Self Care | Payer: MEDICAID

## 2019-03-07 NOTE — Telephone Encounter (Signed)
This patient's mother called to say she needs to speak to the nurse about the pain her daughter is having. She is requesting someone to call her daughter to discuss what they can do for her pain.

## 2019-03-08 ENCOUNTER — Ambulatory Visit (HOSPITAL_COMMUNITY)
Admission: EM | Admit: 2019-03-08 | Discharge: 2019-03-08 | Disposition: A | Discharge status: Home / Self Care | Payer: Self-pay | Attending: Urgent Care | Admitting: Urgent Care

## 2019-03-08 ENCOUNTER — Other Ambulatory Visit: Payer: Self-pay | Admitting: Medical

## 2019-03-08 ENCOUNTER — Other Ambulatory Visit: Payer: Self-pay

## 2019-03-08 ENCOUNTER — Emergency Department (HOSPITAL_COMMUNITY)
Admission: EM | Admit: 2019-03-08 | Discharge: 2019-03-08 | Discharge status: Against Medical Advice | Payer: Medicaid Other | Attending: Emergency Medicine | Admitting: Emergency Medicine

## 2019-03-08 ENCOUNTER — Encounter (HOSPITAL_COMMUNITY): Payer: Self-pay | Admitting: Urgent Care

## 2019-03-08 ENCOUNTER — Telehealth: Payer: Self-pay | Admitting: Obstetrics and Gynecology

## 2019-03-08 DIAGNOSIS — E282 Polycystic ovarian syndrome: Secondary | ICD-10-CM

## 2019-03-08 DIAGNOSIS — R102 Pelvic and perineal pain: Secondary | ICD-10-CM

## 2019-03-08 DIAGNOSIS — Z5321 Procedure and treatment not carried out due to patient leaving prior to being seen by health care provider: Secondary | ICD-10-CM | POA: Insufficient documentation

## 2019-03-08 DIAGNOSIS — L732 Hidradenitis suppurativa: Secondary | ICD-10-CM

## 2019-03-08 MED ORDER — NORGESTIMATE-ETH ESTRADIOL 0.25-35 MG-MCG PO TABS: 1.0000 | ORAL_TABLET | Freq: Every day | ORAL | 11 refills | Status: DC

## 2019-03-08 MED ORDER — NAPROXEN 500 MG PO TABS: 500.0000 mg | ORAL_TABLET | Freq: Two times a day (BID) | ORAL | 1 refills | Status: DC

## 2019-03-08 NOTE — ED Notes (Signed)
Pt did not answer multiple times when called for ready room

## 2019-03-08 NOTE — Telephone Encounter (Signed)
Per front office, patient called in requesting pills as suggested by Almyra Free for her pain. Spoke with Kerry Hough & she states she will send in Rx for patient. Patient informed by front office team member.

## 2019-03-08 NOTE — ED Triage Notes (Signed)
Patient presents to Urgent Care with complaints of needing a work note and pain mediations since she has abscesses under her axilla (left and right) and in her ovaries. Patient reports she went to the ED this morning but LWBS due to wait time, pt still has not seen her PCP as recommended.

## 2019-03-08 NOTE — ED Notes (Signed)
Patient has not answered when called x 2

## 2019-03-08 NOTE — ED Triage Notes (Signed)
Patient complains of ongoing pain related to ovarian cyst recently discovered on u/s. Patient has not seen her MD since diagnosis

## 2019-03-08 NOTE — ED Provider Notes (Signed)
MRN: [id number] DOB: Oct 22, 1995  Subjective:   Jenna Henry is a 23 y.o. female presenting for consult on axillary pain.  Patient states that she has been dealing with recurrent cysts in both of her armpits.  Was told by an ER provider a few years ago that she would have recurrent cyst that would need to be cut out.  She has since dealt with these on her own.  Reports that the left side is currently bothering her but has had issues with this for the past 3 years including the right armpit.  Currently she says it straining on the left side.  She is also had pelvic pains associated with her PCOS.  Of note, patient went to the emergency room this morning, left prior to being seen.  She subsequently contacted her gynecologist and they would like to start a trial of OCPs.  PA Wenzel sent a prescription for Ortho-Cyclen to her pharmacy today.  No current facility-administered medications for this encounter.   Current Outpatient Medications:  .  albuterol (VENTOLIN HFA) 108 (90 Base) MCG/ACT inhaler, Inhale 1-2 puffs into the lungs every 6 (six) hours as needed for wheezing or shortness of breath., Disp: 1 Inhaler, Rfl: 0 .  metroNIDAZOLE (FLAGYL) 500 MG tablet, Take 1 tablet (500 mg total) by mouth 2 (two) times daily., Disp: 14 tablet, Rfl: 0 .  naproxen (NAPROSYN) 500 MG tablet, Take 1 tablet (500 mg total) by mouth 2 (two) times daily. Take with food for abdominal pain and cramping (Patient not taking: Reported on 02/22/2019), Disp: 30 tablet, Rfl: 0 .  norgestimate-ethinyl estradiol (ORTHO-CYCLEN) 0.25-35 MG-MCG tablet, Take 1 tablet by mouth daily., Disp: 1 Package, Rfl: 11 .  omeprazole (PRILOSEC) 20 MG capsule, Take 1 capsule (20 mg total) by mouth daily. (Patient not taking: Reported on 02/22/2019), Disp: 30 capsule, Rfl: 0 .  ondansetron (ZOFRAN) 4 MG tablet, Take 1 tablet (4 mg total) by mouth every 6 (six) hours. (Patient not taking: Reported on 02/22/2019), Disp: 12 tablet, Rfl: 0 .  venlafaxine XR  (EFFEXOR-XR) 75 MG 24 hr capsule, Take 1 capsule (75 mg total) by mouth daily with breakfast. (Patient not taking: Reported on 02/22/2019), Disp: 30 capsule, Rfl: 0   Allergies  Allergen Reactions  . Ortho Tri-Cyclen [Norgestimate-Eth Estradiol] Hives, Itching, Rash and Other (See Comments)    "Burning all over"    Past Medical History:  Diagnosis Date  . Anxiety   . Asthma   . Depression   . Herpes   . Polycystic ovaries      Past Surgical History:  Procedure Laterality Date  . CYSTECTOMY      ROS Denies fever, nausea, vomiting, chest pain, abdominal pain.  Objective:   Vitals: BP 107/74 (BP Location: Right Arm)   Pulse 65   Temp 98.7 F (37.1 C) (Oral)   Resp 16   SpO2 98%   Physical Exam Constitutional:      General: She is not in acute distress.    Appearance: Normal appearance. She is well-developed. She is not ill-appearing.  HENT:     Head: Normocephalic and atraumatic.     Nose: Nose normal.     Mouth/Throat:     Mouth: Mucous membranes are moist.     Pharynx: Oropharynx is clear.  Eyes:     General: No scleral icterus.    Extraocular Movements: Extraocular movements intact.     Pupils: Pupils are equal, round, and reactive to light.  Cardiovascular:     Rate  and Rhythm: Normal rate.  Pulmonary:     Effort: Pulmonary effort is normal.  Skin:    General: Skin is warm and dry.       Neurological:     General: No focal deficit present.     Mental Status: She is alert and oriented to person, place, and time.  Psychiatric:        Mood and Affect: Mood normal.        Behavior: Behavior normal.      Assessment and Plan :   1. PCOS (polycystic ovarian syndrome)   2. Pelvic pain   3. Hidradenitis suppurativa     Informed patient that she has a prescription waiting for her for OCP at her pharmacy.  We will have her use naproxen for pain and inflammation.  Discussed wound care for what I suspect is hidradenitis.  I do not suspect infection  currently.  [facility] Counseled patient on potential for adverse effects with medications prescribed/recommended today, ER and return-to-clinic precautions discussed, patient verbalized understanding.    Jaynee Eagles, [place] 03/08/19 9924

## 2019-03-08 NOTE — Progress Notes (Signed)
Patient called back to the office requesting to start OCPs for PCOS. This option was discussed with the patient at her last visit and she was awaiting test results before starting. All tests were essentially normal, so patient would like to trial OCPs. She stated in her last visit that her allergic reaction to OCPs in her chart was more likely an issue with her laundry detergent and she would like to try them again. Rx for OCPs sent to patient pharmacy of choice.   Luvenia Redden, PA-C 03/08/19 9:24 AM

## 2019-03-08 NOTE — Telephone Encounter (Signed)
The patient called in stating she would like the birth control pills as she was told in her ultrasound message they would help with the pain. She stated she came to our office yesterday and she was told we would send a message to the nurse however no one has called her back. After speaking with the nurse and reviewing the chart. Nurse Morey Hummingbird stated she will consult with Almyra Free fill the request. The patient verified she visits the [facility] on [address].

## 2019-04-12 ENCOUNTER — Encounter (HOSPITAL_COMMUNITY): Payer: Self-pay

## 2019-04-12 ENCOUNTER — Other Ambulatory Visit: Payer: Self-pay

## 2019-04-12 ENCOUNTER — Ambulatory Visit (HOSPITAL_COMMUNITY)
Admission: EM | Admit: 2019-04-12 | Discharge: 2019-04-12 | Disposition: A | Discharge status: Home / Self Care | Payer: Self-pay | Attending: Family Medicine | Admitting: Family Medicine

## 2019-04-12 DIAGNOSIS — R5383 Other fatigue: Secondary | ICD-10-CM

## 2019-04-12 DIAGNOSIS — J4520 Mild intermittent asthma, uncomplicated: Secondary | ICD-10-CM

## 2019-04-12 DIAGNOSIS — Z3202 Encounter for pregnancy test, result negative: Secondary | ICD-10-CM

## 2019-04-12 LAB — POCT PREGNANCY, URINE: Preg Test, Ur: NEGATIVE

## 2019-04-12 LAB — GLUCOSE, CAPILLARY: Glucose-Capillary: 76 mg/dL (ref 70–99)

## 2019-04-12 MED ORDER — PREDNISONE 10 MG (21) PO TBPK: ORAL_TABLET | Freq: Every day | ORAL | 0 refills | Status: DC

## 2019-04-12 NOTE — ED Triage Notes (Addendum)
Pt presents to UC w/ c/o weakness, fatigue, abdominal pain, asthma exacerbations, hot flashes x1 week. Pt states she has finished her first month of birth control this past weekend.

## 2019-04-13 NOTE — ED Provider Notes (Signed)
[facility]   [id number] 04/12/19 Arrival Time: 1506  ASSESSMENT & PLAN:  1. Fatigue, unspecified type   2. Mild intermittent chronic asthma without complication     Begin: Meds ordered this encounter  Medications  . predniSONE (STERAPRED UNI-PAK 21 TAB) 10 MG (21) TBPK tablet    Sig: Take by mouth daily. Take as directed.    Dispense:  21 tablet    Refill:  0   Asthma precautions given. OTC symptom care as needed.  Recommend: Follow-up Information    Schedule an appointment as soon as possible for a visit  with Primary Care at [facility].   Specialty: Family Medicine Contact information: [address] [place] [address] [phone]        Declines lab work for fatigue. Prefers to f/u with PCP.  Reviewed expectations re: course of current medical issues. Questions answered. Outlined signs and symptoms indicating need for more acute intervention. Patient verbalized understanding. After Visit Summary given.  SUBJECTIVE: History from: patient.  Jenna Henry is a 23 y.o. female who presents with complaint of intermittent chest tightness and wheezing. Onset gradual, over the past week or two. Triggers: none identified. Describes wheezing as mild to moderate when present. Fever: no. Overall normal PO intake without n/v. Sick contacts: no. Ambulatory without difficulty. No LE edema. Typically her asthma is well controlled. Inhaler use: more frequent than usual. OTC treatment: none reported; no new medications. No current CP or SOB.  Also reports generalized fatigue over the past week. Unable to elaborate. "Just feel tired." No depression reported. Received flu shot this year: no.  Social History   Tobacco Use  Smoking Status Current Some Day Smoker  . Packs/day: 0.50  Smokeless Tobacco Never Used  Tobacco Comment   4 cigarettes per day    ROS: As per HPI. All other systems negative.    OBJECTIVE:  Vitals:   04/12/19 1551  BP: 101/70  Pulse: 66  Resp: 16  Temp: (!) 97.5 F (36.4 C)  TempSrc: Tympanic  SpO2: 98%     General appearance: alert; NAD HEENT: [place]; AT; without nasal congestion Neck: supple without LAD CV: RRR without murmer Lungs: unlabored respirations, mild bilateral expiratory wheezing; cough: absent; no significant respiratory distress Abd: soft Skin: warm and dry Neuro: normal gait Psychological: alert and cooperative; normal mood and affect    Allergies  Allergen Reactions  . Ortho Tri-Cyclen [Norgestimate-Eth Estradiol] Hives, Itching, Rash and Other (See Comments)    "Burning all over"    Past Medical History:  Diagnosis Date  . Anxiety   . Asthma   . Depression   . Herpes   . Polycystic ovaries    Family History  Problem Relation Age of Onset  . Hypertension Mother   . Anxiety disorder Mother   . Heart murmur Father   . Heart disease Maternal Grandfather   . Diabetes Paternal Grandmother   . Cancer Paternal Grandmother    Social History   Socioeconomic History  . Marital status: Significant Other    Spouse name: Not on file  . Number of children: Not on file  . Years of education: Not on file  . Highest education level: Not on file  Occupational History  . Not on file  Social Needs  . Financial resource strain: Not on file  . Food insecurity    Worry: Often true    Inability: Often true  . Transportation needs    Medical: Yes  Non-medical: Yes  Tobacco Use  . Smoking status: Current Some Day Smoker    Packs/day: 0.50  . Smokeless tobacco: Never Used  . Tobacco comment: 4 cigarettes per day  Substance and Sexual Activity  . Alcohol use: Yes    Comment: occassional  . Drug use: Yes    Types: Marijuana  . Sexual activity: Yes  Lifestyle  . Physical activity    Days per week: Not on file    Minutes per session: Not on file  . Stress: Not on file  Relationships  . Social Herbalist on phone: Not on file    Gets  together: Not on file    Attends religious service: Not on file    Active member of club or organization: Not on file    Attends meetings of clubs or organizations: Not on file    Relationship status: Not on file  . Intimate partner violence    Fear of current or ex partner: Not on file    Emotionally abused: Not on file    Physically abused: Not on file    Forced sexual activity: Not on file  Other Topics Concern  . Not on file  Social History Narrative  . Not on file            Vanessa Kick, MD 04/13/19 [phone]

## 2019-04-24 ENCOUNTER — Encounter (HOSPITAL_COMMUNITY): Payer: Self-pay

## 2019-04-24 ENCOUNTER — Other Ambulatory Visit: Payer: Self-pay

## 2019-04-24 ENCOUNTER — Ambulatory Visit (HOSPITAL_COMMUNITY)
Admission: EM | Admit: 2019-04-24 | Discharge: 2019-04-24 | Disposition: A | Discharge status: Home / Self Care | Payer: Medicaid Other | Attending: Family Medicine | Admitting: Family Medicine

## 2019-04-24 DIAGNOSIS — L02412 Cutaneous abscess of left axilla: Secondary | ICD-10-CM

## 2019-04-24 MED ORDER — DOXYCYCLINE HYCLATE 100 MG PO CAPS: 100.0000 mg | ORAL_CAPSULE | Freq: Two times a day (BID) | ORAL | 0 refills | Status: DC

## 2019-04-24 NOTE — ED Triage Notes (Signed)
Pt states she has a abscess that burst 3 days ago. Pt states her left armpit is in pain from the abscess. X 3 days.

## 2019-04-26 NOTE — ED Provider Notes (Signed)
[facility]   [id number] 04/24/19 Arrival Time: 1140  ASSESSMENT & PLAN:  1. Abscess of left axilla     Area is still draining. No areas of significant fluctuance. No I&D indicated at this time..  Begin: Meds ordered this encounter  Medications   doxycycline (VIBRAMYCIN) 100 MG capsule    Sig: Take 1 capsule (100 mg total) by mouth 2 (two) times daily.    Dispense:  20 capsule    Refill:  0    Follow-up Information    [facility].   Specialty: Urgent Care Why: As needed. Contact information: [address] [place] [address] [phone]          Finish all antibiotics. OTC analgesics as needed.  Reviewed expectations re: course of current medical issues. Questions answered. Outlined signs and symptoms indicating need for more acute intervention. Patient verbalized understanding. After Visit Summary given.   SUBJECTIVE:  Jenna Henry is a 23 y.o. female who presents with a possible infection of her L axilla. H/O similar requiring I&D. Onset gradual, approximately 2-3 days ago with active drainage and without active bleeding; "pus stuff". Symptoms have gradually improved since beginning. Fever: absent. OTC/home treatment: soaking in tub; thinks this helps. No specific aggravating or alleviating factors reported.  ROS: As per HPI. All other systems negative.  OBJECTIVE:  Vitals:   04/24/19 1221 04/24/19 1222  BP:  126/76  Pulse:  81  Resp:  18  Temp:  98.3 F (36.8 C)  TempSrc:  Oral  SpO2:  99%  Weight: 79.8 kg      General appearance: alert; no distress HEENT: [place]; AT Neck: supple with FROM; no LAD CV: RRR Lungs: unlabored respirations Abd: soft Ext: no edema Skin: L axilla with approx 0.5 cm area of thickened skin with central opening; slightly purulent drainage; mildly tender to touch; no active bleeding LUE: normal ROM Neuro: normal sensation of LUE Psychological: alert and  cooperative; normal mood and affect  Allergies  Allergen Reactions   Ortho Tri-Cyclen [Norgestimate-Eth Estradiol] Hives, Itching, Rash and Other (See Comments)    "Burning all over"    Past Medical History:  Diagnosis Date   Anxiety    Asthma    Depression    Herpes    Polycystic ovaries    Social History   Socioeconomic History   Marital status: Significant Other    Spouse name: Not on file   Number of children: Not on file   Years of education: Not on file   Highest education level: Not on file  Occupational History   Not on file  Social Needs   Financial resource strain: Not on file   Food insecurity    Worry: Often true    Inability: Often true   Transportation needs    Medical: Yes    Non-medical: Yes  Tobacco Use   Smoking status: Current Some Day Smoker    Packs/day: 0.50   Smokeless tobacco: Never Used   Tobacco comment: 4 cigarettes per day  Substance and Sexual Activity   Alcohol use: Yes    Comment: occassional   Drug use: Yes    Types: Marijuana   Sexual activity: Yes  Lifestyle   Physical activity    Days per week: Not on file    Minutes per session: Not on file   Stress: Not on file  Relationships   Social connections    Talks on phone: Not on file  Gets together: Not on file    Attends religious service: Not on file    Active member of club or organization: Not on file    Attends meetings of clubs or organizations: Not on file    Relationship status: Not on file  Other Topics Concern   Not on file  Social History Narrative   Not on file   Family History  Problem Relation Age of Onset   Hypertension Mother    Anxiety disorder Mother    Heart murmur Father    Heart disease Maternal Grandfather    Diabetes Paternal Grandmother    Cancer Paternal Grandmother    Past Surgical History:  Procedure Laterality Date   Concepcion Elk, MD 04/26/19 [phone]

## 2019-05-21 ENCOUNTER — Other Ambulatory Visit: Payer: Self-pay

## 2019-05-21 ENCOUNTER — Emergency Department (HOSPITAL_BASED_OUTPATIENT_CLINIC_OR_DEPARTMENT_OTHER)
Admission: EM | Admit: 2019-05-21 | Discharge: 2019-05-21 | Disposition: A | Discharge status: Home / Self Care | Payer: Self-pay | Attending: Emergency Medicine | Admitting: Emergency Medicine

## 2019-05-21 ENCOUNTER — Encounter (HOSPITAL_BASED_OUTPATIENT_CLINIC_OR_DEPARTMENT_OTHER): Payer: Self-pay | Admitting: Emergency Medicine

## 2019-05-21 ENCOUNTER — Emergency Department (HOSPITAL_BASED_OUTPATIENT_CLINIC_OR_DEPARTMENT_OTHER): Payer: Self-pay

## 2019-05-21 DIAGNOSIS — R0789 Other chest pain: Secondary | ICD-10-CM | POA: Insufficient documentation

## 2019-05-21 DIAGNOSIS — Z79899 Other long term (current) drug therapy: Secondary | ICD-10-CM | POA: Insufficient documentation

## 2019-05-21 DIAGNOSIS — Z20828 Contact with and (suspected) exposure to other viral communicable diseases: Secondary | ICD-10-CM | POA: Insufficient documentation

## 2019-05-21 DIAGNOSIS — F1210 Cannabis abuse, uncomplicated: Secondary | ICD-10-CM | POA: Insufficient documentation

## 2019-05-21 DIAGNOSIS — J45909 Unspecified asthma, uncomplicated: Secondary | ICD-10-CM | POA: Insufficient documentation

## 2019-05-21 DIAGNOSIS — F17210 Nicotine dependence, cigarettes, uncomplicated: Secondary | ICD-10-CM | POA: Insufficient documentation

## 2019-05-21 DIAGNOSIS — R0602 Shortness of breath: Secondary | ICD-10-CM

## 2019-05-21 MED ORDER — IBUPROFEN 400 MG PO TABS
600.0000 mg | ORAL_TABLET | Freq: Once | ORAL | Status: AC
  Administered 2019-05-21: 11:00:00 600 mg via ORAL
  Filled 2019-05-21: qty 1

## 2019-05-21 MED ORDER — ALBUTEROL SULFATE HFA 108 (90 BASE) MCG/ACT IN AERS
4.0000 | INHALATION_SPRAY | Freq: Once | RESPIRATORY_TRACT | Status: AC
  Administered 2019-05-21: 4 via RESPIRATORY_TRACT
  Filled 2019-05-21: qty 6.7

## 2019-05-21 NOTE — ED Notes (Signed)
XR at bedside

## 2019-05-21 NOTE — ED Provider Notes (Addendum)
[facility] DEPARTMENT Provider Note   CSN: [id number] Arrival date & time: 05/21/19  1007     History Chief Complaint  Patient presents with  . Shortness of Breath    Jenna Henry is a 23 y.o. female.  HPI   23 year old female with a history of anxiety/depression, asthma, herpes, PCOS, presenting for evaluation of possible asthma attack.  Patient states she was at work and she went outside to get some fresh air.  Upon coming back inside she states she had some sharp pains in her chest and had some shortness of breath as well.  She became concerned so she called EMS.  She took her inhaler prior to arrival which improved symptoms somewhat.  States that asthma is usually worse in the colder months and she has to use her inhaler about twice per week.  She denies any recent fevers, cough, hemoptysis, leg pain/swelling, estrogen use, recent periods of immobility, history of DVT/PE.  No known Covid exposures.  She does report that her chest wall is somewhat tender to palpation.  She reports starting a new job 2 days ago and doing some heavy lifting after starting the job.  She is not sure if this contributed to her symptoms today.  Past Medical History:  Diagnosis Date  . Anxiety   . Asthma   . Depression   . Herpes   . Polycystic ovaries     Patient Active Problem List   Diagnosis Date Noted  . Bipolar disorder ([place]) 10/11/2018  . MDD (major depressive disorder) 10/10/2018  . Intentional drug overdose ([place]) 10/09/2018  . Hypokalemia 10/09/2018  . 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 Palpitations 11/09/2017  . Chest pain 11/09/2017  . Cigarette smoker 11/09/2017  . Severe major depression without psychotic features ([place]) 02/28/2017  . 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 Suicide attempt ([place]) 02/28/2017  . Anxiety state 02/28/2017  . Insomnia 02/28/2017  . Herpes 11/12/2016  . Irregular menstruation  07/06/2016    Past Surgical History:  Procedure Laterality Date  . CYSTECTOMY       OB History    Gravida  0   Para  0   Term  0   Preterm  0   AB  0   Living  0     SAB  0   TAB  0   Ectopic  0   Multiple  0   Live Births  0           Family History  Problem Relation Age of Onset  . Hypertension Mother   . Anxiety disorder Mother   . Heart murmur Father   . Heart disease Maternal Grandfather   . Diabetes Paternal Grandmother   . Cancer Paternal Grandmother     Social History   Tobacco Use  . Smoking status: Current Some Day Smoker    Packs/day: 0.50  . Smokeless tobacco: Never Used  . Tobacco comment: 4 cigarettes per day  Substance Use Topics  . Alcohol use: Yes    Comment: occassional  . Drug use: Yes    Types: Marijuana    Home Medications Prior to Admission medications   Medication Sig Start Date End Date Taking? Authorizing Provider  albuterol (VENTOLIN HFA) 108 (90 Base) MCG/ACT inhaler Inhale 1-2 puffs into the lungs every 6 (six) hours as needed for wheezing or shortness of breath. 10/13/18   Money, Lowry Ram, FNP  doxycycline (VIBRAMYCIN) 100 MG capsule Take 1  capsule (100 mg total) by mouth 2 (two) times daily. 04/24/19   Mardella LaymanHagler, Brian, MD  naproxen (NAPROSYN) 500 MG tablet Take 1 tablet (500 mg total) by mouth 2 (two) times daily with a meal. Take with food for abdominal pain and cramping 03/08/19   Wallis BambergMani, Mario, PA-C  norgestimate-ethinyl estradiol (ORTHO-CYCLEN) 0.25-35 MG-MCG tablet Take 1 tablet by mouth daily. 03/08/19   Marny LowensteinWenzel, Julie N, PA-C  omeprazole (PRILOSEC) 20 MG capsule Take 1 capsule (20 mg total) by mouth daily. Patient not taking: Reported on 02/22/2019 11/11/18   Eustace MooreNelson, Yvonne Sue, MD  predniSONE (STERAPRED UNI-PAK 21 TAB) 10 MG (21) TBPK tablet Take by mouth daily. Take as directed. 04/12/19   Mardella LaymanHagler, Brian, MD  venlafaxine XR (EFFEXOR-XR) 75 MG 24 hr capsule Take 1 capsule (75 mg total) by mouth daily with  breakfast. Patient not taking: Reported on 02/22/2019 10/14/18 04/12/19  Money, Gerlene Burdockravis B, FNP    Allergies    Ortho tri-cyclen [norgestimate-eth estradiol]  Review of Systems   Review of Systems  Constitutional: Negative for fever.  HENT: Negative for congestion, rhinorrhea and sore throat.   Eyes: Negative for visual disturbance.  Respiratory: Positive for shortness of breath. Negative for cough.   Cardiovascular: Positive for chest pain (tightness). Negative for leg swelling.  Gastrointestinal: Negative for abdominal pain, constipation, diarrhea, nausea and vomiting.  Genitourinary: Negative for pelvic pain.  Musculoskeletal: Negative for back pain.  Skin: Negative for color change.  Neurological: Negative for headaches.    Physical Exam Updated Vital Signs BP 127/75 (BP Location: Right Arm)   Pulse 70   Temp 98.1 F (36.7 C) (Oral)   Resp 16   Ht 5\' 6"  (1.676 m)   Wt 79.8 kg   LMP 05/09/2019   SpO2 100%   BMI 28.41 kg/m   Physical Exam Vitals and nursing note reviewed.  Constitutional:      General: She is not in acute distress.    Appearance: She is well-developed.  HENT:     Head: Normocephalic and atraumatic.  Eyes:     Conjunctiva/sclera: Conjunctivae normal.  Cardiovascular:     Rate and Rhythm: Normal rate and regular rhythm.     Heart sounds: Normal heart sounds. No murmur.  Pulmonary:     Effort: Pulmonary effort is normal. No respiratory distress.     Breath sounds: Normal breath sounds. No decreased breath sounds, wheezing, rhonchi or rales.  Chest:     Chest wall: Tenderness (midline chest TTP that reproduces pain) present.  Abdominal:     Palpations: Abdomen is soft.     Tenderness: There is no abdominal tenderness.  Musculoskeletal:     Cervical back: Neck supple.     Right lower leg: No tenderness. No edema.     Left lower leg: No tenderness. No edema.  Skin:    General: Skin is warm and dry.  Neurological:     Mental Status: She is alert.      ED Results / Procedures / Treatments   Labs (all labs ordered are listed, but only abnormal results are displayed) Labs Reviewed  NOVEL CORONAVIRUS, NAA (HOSP ORDER, SEND-OUT TO REF LAB; TAT 18-24 HRS)    EKG EKG Interpretation  Date/Time:  Sunday May 21 2019 10:45:03 EST Ventricular Rate:  89 PR Interval:    QRS Duration: 85 QT Interval:  352 QTC Calculation: 429 R Axis:   50 Text Interpretation: Sinus rhythm Borderline prolonged PR interval RSR' in V1 or V2, probably normal variant No  significant change since 11/02/2018 Confirmed by Geoffery Lyons ([id number]) on 05/21/2019 10:47:57 AM   Radiology DG Chest Portable 1 View  Result Date: 05/21/2019 CLINICAL DATA:  Shortness of breath EXAM: PORTABLE CHEST 1 VIEW COMPARISON:  01/11/2018 FINDINGS: Normal heart size and mediastinal contours. No acute infiltrate or edema. No effusion or pneumothorax. No acute osseous findings. IMPRESSION: Negative chest. Electronically Signed   By: Marnee Spring M.D.   On: 05/21/2019 11:27    Procedures Procedures (including critical care time)  Medications Ordered in ED Medications  albuterol (VENTOLIN HFA) 108 (90 Base) MCG/ACT inhaler 4 puff (4 puffs Inhalation Given 05/21/19 1045)  ibuprofen (ADVIL) tablet 600 mg (600 mg Oral Given 05/21/19 1043)    ED Course  I have reviewed the triage vital signs and the nursing notes.  Pertinent labs & imaging results that were available during my care of the patient were reviewed by me and considered in my medical decision making (see chart for details).    MDM Rules/Calculators/A&P                     23 y/o f with h/o asthma presenting with concern for possible asthma exacerbation. States she experienced sob, chest tightness/stabbing pain pta after being outside in the cold at work. Was given albuterol en route with EMS and sxs have improved.   Normal VS on initial eval and reassuring exam.   EKG unchanged from prior. No tachycardia, or  ischemic changes.  CXR neg for acute abnormality  Pt given albuterol and ibuprofen in the ED. On reassessment, pt states she is feeling much improved. States she only has pain in her chest when she pushes on in. No pain with inspiration or continued sob. Pt low risk wells. PERC negative. Doubt PE. Doubt other emergent cause of sxs. Advised her to use nsaids at home for chest wall pain. Advised continuation of her inhaler. She was tested for covid. She was advised on quarantine measures. Advised close pcp f/u and gave strict return precautions. She voices understanding of the plan and reasons to return. All questions answered, pt stable for d/c.  -----  [name] was evaluated in Emergency Department on 05/21/2019 for the symptoms described in the history of present illness. She was evaluated in the context of the global COVID-19 pandemic, which necessitated consideration that the patient might be at risk for infection with the SARS-CoV-2 virus that causes COVID-19. Institutional protocols and algorithms that pertain to the evaluation of patients at risk for COVID-19 are in a state of rapid change based on information released by regulatory bodies including the CDC and federal and state organizations. These policies and algorithms were followed during the patient's care in the ED.   Final Clinical Impression(s) / ED Diagnoses Final diagnoses:  Chest wall pain  SOB (shortness of breath)    Rx / DC Orders ED Discharge Orders    None       Karrie Meres, PA-C 05/21/19 [address] Kelilah Hebard S, PA-C 05/21/19 1751    Geoffery Lyons, MD 05/24/19 1503

## 2019-05-21 NOTE — ED Triage Notes (Signed)
Pt arrives via EMS and states she had an asthma attack at work. She used her inhaler and feels some better. She still has tightness in her chest when she takes a deep breath.

## 2019-05-21 NOTE — ED Notes (Signed)
ED Provider at bedside. 

## 2019-05-21 NOTE — Discharge Instructions (Signed)
You may alternate taking Tylenol and Ibuprofen as needed for pain control. You may take 400-600 mg of ibuprofen every 6 hours and [id number] mg of Tylenol every 6 hours. Do not exceed 4000 mg of Tylenol daily as this can lead to liver damage. Also, make sure to take Ibuprofen with meals as it can cause an upset stomach. Do not take other NSAIDs while taking Ibuprofen such as (Aleve, Naprosyn, Aspirin, Celebrex, etc) and do not take more than the prescribed dose as this can lead to ulcers and bleeding in your GI tract. You may use warm and cold compresses to help with your symptoms.   Use two puffs of the albuterol inhaler as needed for shortness of breath.   Today requested for the coronavirus.  The results will not be available for the next several days.  If the results are positive the hospital contact you and let you know.  If the results are negative you will not be contacted. Please check for your results on mychart.   You should be isolated for at least 7 days since the onset of your symptoms AND >72 hours after symptoms resolution (absence of fever without the use of fever reducing medication and improvement in respiratory symptoms), whichever is longer   Please follow up with your primary doctor within the next 7-10 days for re-evaluation and further treatment of your symptoms.   Please return to the ER sooner if you have any new or worsening symptoms.

## 2019-05-22 LAB — NOVEL CORONAVIRUS, NAA (HOSP ORDER, SEND-OUT TO REF LAB; TAT 18-24 HRS): SARS-CoV-2, NAA: NOT DETECTED

## 2019-06-28 ENCOUNTER — Emergency Department (HOSPITAL_COMMUNITY)
Admission: EM | Admit: 2019-06-28 | Discharge: 2019-06-28 | Disposition: A | Discharge status: Home / Self Care | Payer: Self-pay | Attending: Emergency Medicine | Admitting: Emergency Medicine

## 2019-06-28 ENCOUNTER — Emergency Department (HOSPITAL_COMMUNITY): Payer: Self-pay

## 2019-06-28 ENCOUNTER — Other Ambulatory Visit: Payer: Self-pay

## 2019-06-28 ENCOUNTER — Encounter (HOSPITAL_COMMUNITY): Payer: Self-pay

## 2019-06-28 DIAGNOSIS — F17210 Nicotine dependence, cigarettes, uncomplicated: Secondary | ICD-10-CM | POA: Insufficient documentation

## 2019-06-28 DIAGNOSIS — F319 Bipolar disorder, unspecified: Secondary | ICD-10-CM | POA: Insufficient documentation

## 2019-06-28 DIAGNOSIS — Z79899 Other long term (current) drug therapy: Secondary | ICD-10-CM | POA: Insufficient documentation

## 2019-06-28 DIAGNOSIS — R42 Dizziness and giddiness: Secondary | ICD-10-CM | POA: Insufficient documentation

## 2019-06-28 DIAGNOSIS — R55 Syncope and collapse: Secondary | ICD-10-CM | POA: Insufficient documentation

## 2019-06-28 DIAGNOSIS — R519 Headache, unspecified: Secondary | ICD-10-CM | POA: Insufficient documentation

## 2019-06-28 DIAGNOSIS — Z793 Long term (current) use of hormonal contraceptives: Secondary | ICD-10-CM | POA: Insufficient documentation

## 2019-06-28 LAB — URINALYSIS, ROUTINE W REFLEX MICROSCOPIC
Bacteria, UA: NONE SEEN
Bilirubin Urine: NEGATIVE
Glucose, UA: NEGATIVE mg/dL
Hgb urine dipstick: NEGATIVE
Ketones, ur: NEGATIVE mg/dL
Leukocytes,Ua: NEGATIVE
Nitrite: NEGATIVE
Protein, ur: NEGATIVE mg/dL
Specific Gravity, Urine: 1.006 (ref 1.005–1.030)
pH: 8 (ref 5.0–8.0)

## 2019-06-28 LAB — COMPREHENSIVE METABOLIC PANEL
ALT: 18 U/L (ref 0–44)
AST: 27 U/L (ref 15–41)
Albumin: 4.2 g/dL (ref 3.5–5.0)
Alkaline Phosphatase: 53 U/L (ref 38–126)
Anion gap: 6 (ref 5–15)
BUN: 4 mg/dL — ABNORMAL LOW (ref 6–20)
CO2: 25 mmol/L (ref 22–32)
Calcium: 10.2 mg/dL (ref 8.9–10.3)
Chloride: 106 mmol/L (ref 98–111)
Creatinine, Ser: 0.74 mg/dL (ref 0.44–1.00)
GFR calc Af Amer: >60 mL/min (ref >60)
GFR calc non Af Amer: >60 mL/min (ref >60)
Glucose, Bld: 76 mg/dL (ref 70–99)
Potassium: 3.8 mmol/L (ref 3.5–5.1)
Sodium: 137 mmol/L (ref 135–145)
Total Bilirubin: 1.1 mg/dL (ref 0.3–1.2)
Total Protein: 7.3 g/dL (ref 6.5–8.1)

## 2019-06-28 LAB — CBC WITH DIFFERENTIAL/PLATELET
Abs Immature Granulocytes: 0.01 10*3/uL (ref 0.00–0.07)
Basophils Absolute: 0 10*3/uL (ref 0.0–0.1)
Basophils Relative: 0 %
Eosinophils Absolute: 0.1 10*3/uL (ref 0.0–0.5)
Eosinophils Relative: 2 %
HCT: 44.6 % (ref 36.0–46.0)
Hemoglobin: 14.6 g/dL (ref 12.0–15.0)
Immature Granulocytes: 0 %
Lymphocytes Relative: 41 %
Lymphs Abs: 2.2 10*3/uL (ref 0.7–4.0)
MCH: 32.2 pg (ref 26.0–34.0)
MCHC: 32.7 g/dL (ref 30.0–36.0)
MCV: 98.5 fL (ref 80.0–100.0)
Monocytes Absolute: 0.3 10*3/uL (ref 0.1–1.0)
Monocytes Relative: 5 %
Neutro Abs: 2.7 10*3/uL (ref 1.7–7.7)
Neutrophils Relative %: 52 %
Platelets: 270 10*3/uL (ref 150–400)
RBC: 4.53 MIL/uL (ref 3.87–5.11)
RDW: 13.3 % (ref 11.5–15.5)
WBC: 5.2 10*3/uL (ref 4.0–10.5)
nRBC: 0 % (ref 0.0–0.2)

## 2019-06-28 LAB — POC URINE PREG, ED: Preg Test, Ur: NEGATIVE

## 2019-06-28 LAB — CBG MONITORING, ED: Glucose-Capillary: 125 mg/dL — ABNORMAL HIGH (ref 70–99)

## 2019-06-28 MED ORDER — MECLIZINE HCL 25 MG PO TABS
25.0000 mg | ORAL_TABLET | Freq: Once | ORAL | Status: AC
  Administered 2019-06-28: 25 mg via ORAL
  Filled 2019-06-28: qty 1

## 2019-06-28 MED ORDER — SODIUM CHLORIDE 0.9 % IV BOLUS
1000.0000 mL | Freq: Once | INTRAVENOUS | Status: AC
  Administered 2019-06-28: 1000 mL via INTRAVENOUS

## 2019-06-28 NOTE — ED Triage Notes (Signed)
[place] like she got to cold. Pt states she was then sitting inside and began to feel short of breath pt then decided to go and get an inhaler from her car and only remembers opening the door and then woke up on the ground. From then had a left sided headache, left sided back pain. Pt reports she has passed out before and was told it was due to a low heart rate. Pt states since this event she has had intermittent blurry vision from her left eye.

## 2019-06-28 NOTE — ED Provider Notes (Signed)
[facility] EMERGENCY DEPARTMENT Provider Note   CSN: [id number] Arrival date & time: 06/28/19  0818     History Chief Complaint  Patient presents with  . Loss of Consciousness    Jenna Henry is a 24 y.o. female.  The history is provided by the patient and medical records. No language interpreter was used.  Loss of Consciousness    24 year old female with history of depression, prior suicide attempt, bipolar, irregular menstruation presenting for evaluation of loss of consciousness.  Patient [place] and after staying outside for approximately 2 hours, she felt cold and tired.  She then developed an acute episode of shortness of breath and was planning to go to her car to retrieve her inhaler when she apparently had a syncopal episode, fell down to the ground.  When she came to, she noticed people were around her.  And they retrieved the inhaler, after using inhalers, she she report feeling better.  Since then, she has been experiencing intermittent lightheadedness and dizziness.  Described as feeling like she is going to pass out with occasional room spinning sensation worse with positional change.  She endorsed throbbing occipital headache, mild to moderate in severity, and due to her dizziness, she does not feel comfortable going back to work.  She did complain of pain to the left side of her body from the fall but denies any focal numbness or focal weakness.  She does not complaining of nausea vomiting diarrhea active chest pain or shortness of breath no cough no congestion no Covid symptoms.  She denies any significant abdominal pain, dysuria or hematuria.  Last menstrual period was 2 weeks ago.  She is currently not on any blood thinner medications.  She is not on any oral birth control pill and no prior history of PE or DVT.  She admits she has history of bradycardia, denies any heart palpitation.  At home she did  try taking Advil on occasion for headache with some improvement.  Past Medical History:  Diagnosis Date  . Anxiety   . Asthma   . Depression   . Herpes   . Polycystic ovaries     Patient Active Problem List   Diagnosis Date Noted  . Bipolar disorder (HCC) 10/11/2018  . MDD (major depressive disorder) 10/10/2018  . Intentional drug overdose (HCC) 10/09/2018  . Hypokalemia 10/09/2018  . 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 Palpitations 11/09/2017  . Chest pain 11/09/2017  . Cigarette smoker 11/09/2017  . Severe major depression without psychotic features (HCC) 02/28/2017  . 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 Suicide attempt (HCC) 02/28/2017  . Anxiety state 02/28/2017  . Insomnia 02/28/2017  . Herpes 11/12/2016  . Irregular menstruation 07/06/2016    Past Surgical History:  Procedure Laterality Date  . CYSTECTOMY       OB History    Gravida  0   Para  0   Term  0   Preterm  0   AB  0   Living  0     SAB  0   TAB  0   Ectopic  0   Multiple  0   Live Births  0           Family History  Problem Relation Age of Onset  . Hypertension Mother   . Anxiety disorder Mother   . Heart murmur Father   . Heart disease  Maternal Grandfather   . Diabetes Paternal Grandmother   . Cancer Paternal Grandmother     Social History   Tobacco Use  . Smoking status: Current Some Day Smoker    Packs/day: 0.50  . Smokeless tobacco: Never Used  . Tobacco comment: 4 cigarettes per day  Substance Use Topics  . Alcohol use: Yes    Comment: occassional  . Drug use: Yes    Types: Marijuana    Home Medications Prior to Admission medications   Medication Sig Start Date End Date Taking? Authorizing Provider  albuterol (VENTOLIN HFA) 108 (90 Base) MCG/ACT inhaler Inhale 1-2 puffs into the lungs every 6 (six) hours as needed for wheezing or shortness of breath. 10/13/18   Money, Lowry Ram, FNP  doxycycline  (VIBRAMYCIN) 100 MG capsule Take 1 capsule (100 mg total) by mouth 2 (two) times daily. 04/24/19   Vanessa Kick, MD  naproxen (NAPROSYN) 500 MG tablet Take 1 tablet (500 mg total) by mouth 2 (two) times daily with a meal. Take with food for abdominal pain and cramping 03/08/19   Jaynee Eagles, PA-C  norgestimate-ethinyl estradiol (ORTHO-CYCLEN) 0.25-35 MG-MCG tablet Take 1 tablet by mouth daily. 03/08/19   Luvenia Redden, PA-C  omeprazole (PRILOSEC) 20 MG capsule Take 1 capsule (20 mg total) by mouth daily. Patient not taking: Reported on 02/22/2019 11/11/18   Raylene Everts, MD  predniSONE (STERAPRED UNI-PAK 21 TAB) 10 MG (21) TBPK tablet Take by mouth daily. Take as directed. 04/12/19   Vanessa Kick, MD  venlafaxine XR (EFFEXOR-XR) 75 MG 24 hr capsule Take 1 capsule (75 mg total) by mouth daily with breakfast. Patient not taking: Reported on 02/22/2019 10/14/18 04/12/19  Money, Lowry Ram, FNP    Allergies    Ortho tri-cyclen [norgestimate-eth estradiol]  Review of Systems   Review of Systems  Cardiovascular: Positive for syncope.  All other systems reviewed and are negative.   Physical Exam Updated Vital Signs BP 127/87 (BP Location: Left Arm)   Pulse 65   Temp 98.2 F (36.8 C) (Oral)   Resp 14   SpO2 100%   Physical Exam Vitals and nursing note reviewed.  Constitutional:      General: She is not in acute distress.    Appearance: She is well-developed.  HENT:     Head: Normocephalic and atraumatic.     Comments: No signs of scalp injury no midface tenderness Eyes:     Extraocular Movements: Extraocular movements intact.     Conjunctiva/sclera: Conjunctivae normal.     Pupils: Pupils are equal, round, and reactive to light.  Cardiovascular:     Rate and Rhythm: Normal rate.     Pulses: Normal pulses.  Pulmonary:     Effort: Pulmonary effort is normal.     Breath sounds: Normal breath sounds.  Abdominal:     Palpations: Abdomen is soft.     Tenderness: There is no  abdominal tenderness.  Musculoskeletal:        General: No tenderness.     Cervical back: Normal range of motion and neck supple.  Skin:    Findings: No rash.  Neurological:     Mental Status: She is alert and oriented to person, place, and time.     Comments: Neurologic exam:  Speech clear, pupils equal round reactive to light, extraocular movements intact  Normal peripheral visual fields Cranial nerves III through XII normal including no facial droop Follows commands, moves all extremities x4, normal strength to bilateral upper and lower  extremities at all major muscle groups including grip Sensation normal to light touch Coordination intact, no limb ataxia, finger-nose-finger normal Rapid alternating movements normal No pronator drift Gait normal   Psychiatric:        Mood and Affect: Mood normal.     ED Results / Procedures / Treatments   Labs (all labs ordered are listed, but only abnormal results are displayed) Labs Reviewed  COMPREHENSIVE METABOLIC PANEL - Abnormal; Notable for the following components:      Result Value   BUN 4 (*)    All other components within normal limits  CBG MONITORING, ED - Abnormal; Notable for the following components:   Glucose-Capillary 125 (*)    All other components within normal limits  CBC WITH DIFFERENTIAL/PLATELET  URINALYSIS, ROUTINE W REFLEX MICROSCOPIC  POC URINE PREG, ED    EKG EKG Interpretation  Date/Time:  Wednesday June 28 2019 08:30:02 EST Ventricular Rate:  74 PR Interval:    QRS Duration: 83 QT Interval:  362 QTC Calculation: 402 R Axis:   69 Text Interpretation: Sinus rhythm No significant change was found Confirmed by Glynn Octave [phone]) on 06/28/2019 8:33:30 AM   Radiology CT Head Wo Contrast  Result Date: 06/28/2019 CLINICAL DATA:  Left side headache since an episode of shortness of breath and syncope 06/24/2019. EXAM: CT HEAD WITHOUT CONTRAST TECHNIQUE: Contiguous axial images were obtained from  the base of the skull through the vertex without intravenous contrast. COMPARISON:  Head CT scan 05/28/2017. FINDINGS: Brain: No evidence of acute infarction, hemorrhage, hydrocephalus, extra-axial collection or mass lesion/mass effect. Vascular: No hyperdense vessel or unexpected calcification. Skull: Normal. Negative for fracture or focal lesion. Sinuses/Orbits: Negative. Other: None. IMPRESSION: Negative head CT. Electronically Signed   By: Drusilla Kanner M.D.   On: 06/28/2019 10:25    Procedures Procedures (including critical care time)  Medications Ordered in ED Medications  meclizine (ANTIVERT) tablet 25 mg (25 mg Oral Given 06/28/19 0913)  sodium chloride 0.9 % bolus 1,000 mL (1,000 mLs Intravenous New Bag/Given 06/28/19 1009)    ED Course  I have reviewed the triage vital signs and the nursing notes.  Pertinent labs & imaging results that were available during my care of the patient were reviewed by me and considered in my medical decision making (see chart for details).    MDM Rules/Calculators/A&P                      BP 108/74 (BP Location: Right Arm)   Pulse 60   Temp 98.3 F (36.8 C) (Oral)   Resp 16   SpO2 100%   Final Clinical Impression(s) / ED Diagnoses Final diagnoses:  Syncope and collapse    Rx / DC Orders ED Discharge Orders    None     8:49 AM Patient with a syncopal episode 3 days prior here with intermittent lightheadedness and dizziness.  History of bradycardia and history of asthma.  She did endorse transient shortness of breath initially which is since resolved after using her inhaler.  No wheezing on exam.  Patient is PERC negative, low suspicion for PE.  She does not have any focal neuro deficit on exam to suggest acute stroke.  Work-up initiated.  1:43 PM Normal head CT scan, labs are reassuring, pregnancy test is normal, UA without signs of urinary tract infection, EKG without concerning ischemic changes or abnormal cardiac arrhythmia.  Patient  is resting comfortably.  Patient received meclizine with some improvement.  At this  time she have received IV fluid and meclizine and is stable for discharge.  Encourage patient to follow-up outpatient for further evaluation of her condition.  Return precaution discussed.   Fayrene Helper, PA-C 06/28/19 1344    Glynn Octave, MD 06/28/19 [phone]

## 2019-06-28 NOTE — ED Notes (Signed)
Patient transported to Ct. At this time. 

## 2019-06-28 NOTE — ED Notes (Signed)
Pt unable to provide urine specimen at this time

## 2019-06-28 NOTE — ED Notes (Signed)
Patient returned back from Ct. 

## 2019-06-28 NOTE — Discharge Instructions (Signed)
Your work up today is reassuring.  No concerning finding.  Please follow up with a primary care provider for further management of your health.

## 2019-08-25 ENCOUNTER — Ambulatory Visit (INDEPENDENT_AMBULATORY_CARE_PROVIDER_SITE_OTHER): Payer: Self-pay

## 2019-08-25 ENCOUNTER — Encounter (HOSPITAL_COMMUNITY): Payer: Self-pay | Admitting: Emergency Medicine

## 2019-08-25 ENCOUNTER — Other Ambulatory Visit: Payer: Self-pay

## 2019-08-25 ENCOUNTER — Ambulatory Visit (HOSPITAL_COMMUNITY)
Admission: EM | Admit: 2019-08-25 | Discharge: 2019-08-25 | Disposition: A | Discharge status: Home / Self Care | Payer: Self-pay | Attending: Emergency Medicine | Admitting: Emergency Medicine

## 2019-08-25 DIAGNOSIS — M79675 Pain in left toe(s): Secondary | ICD-10-CM

## 2019-08-25 DIAGNOSIS — S92522A Displaced fracture of medial phalanx of left lesser toe(s), initial encounter for closed fracture: Secondary | ICD-10-CM

## 2019-08-25 MED ORDER — NAPROXEN 500 MG PO TABS: 500.0000 mg | ORAL_TABLET | Freq: Two times a day (BID) | ORAL | 0 refills | Status: DC

## 2019-08-25 NOTE — ED Triage Notes (Signed)
Pt sts left foot pain after twisting ankle while getting out of shower last night

## 2019-08-25 NOTE — Discharge Instructions (Signed)
You do have a fracture of your 4th toe.  This is treated with immobilization with the shoe we have provided you.  Limited on foot activity for the next week to help with pain. Ice, elevation, naproxen twice a day (take with food) to help with pain.  Follow up with orthopedics in the next few weeks for recheck or with your primary care provider.

## 2019-08-26 NOTE — ED Provider Notes (Signed)
[place]    CSN: [id number] Arrival date & time: 08/25/19  1059      History   Chief Complaint Chief Complaint  Patient presents with  . Foot Pain    HPI Jenna Henry is a 24 y.o. female.   Jenna Henry presents with complaints of left toe pain after a fall and injury yesterday. She was getting out of the shower, slipped, causing her foot to kick out and strike her toilet. Pain and swelling to her left foot 3-5th toes since. Pain with weight bearing. No numbness or tingling. Hasn't taken any medications for pain. Denies any previous injury to her foot.    ROS per HPI, negative if not otherwise mentioned.      Past Medical History:  Diagnosis Date  . Anxiety   . Asthma   . Depression   . Herpes   . Polycystic ovaries     Patient Active Problem List   Diagnosis Date Noted  . Bipolar disorder ([place]) 10/11/2018  . MDD (major depressive disorder) 10/10/2018  . Intentional drug overdose ([place]) 10/09/2018  . Hypokalemia 10/09/2018  . 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 Palpitations 11/09/2017  . Chest pain 11/09/2017  . Cigarette smoker 11/09/2017  . Severe major depression without psychotic features ([place]) 02/28/2017  . MDD (major depressive disorder), recurrent, severe, with psychosis ([place]) 02/28/2017  . Suicide attempt ([place]) 02/28/2017  . Anxiety state 02/28/2017  . Insomnia 02/28/2017  . Herpes 11/12/2016  . Irregular menstruation 07/06/2016    Past Surgical History:  Procedure Laterality Date  . CYSTECTOMY      OB History    Gravida  0   Para  0   Term  0   Preterm  0   AB  0   Living  0     SAB  0   TAB  0   Ectopic  0   Multiple  0   Live Births  0            Home Medications    Prior to Admission medications   Medication Sig Start Date End Date Taking? Authorizing Provider  albuterol (VENTOLIN HFA) 108 (90 Base) MCG/ACT inhaler Inhale 1-2 puffs into the lungs every 6 (six) hours as needed for wheezing or  shortness of breath. 10/13/18   Money, Lowry Ram, FNP  naproxen (NAPROSYN) 500 MG tablet Take 1 tablet (500 mg total) by mouth 2 (two) times daily. 08/25/19   Zigmund Gottron, NP  venlafaxine XR (EFFEXOR-XR) 75 MG 24 hr capsule Take 1 capsule (75 mg total) by mouth daily with breakfast. Patient not taking: Reported on 02/22/2019 10/14/18 04/12/19  Money, Lowry Ram, FNP    Family History Family History  Problem Relation Age of Onset  . Hypertension Mother   . Anxiety disorder Mother   . Heart murmur Father   . Heart disease Maternal Grandfather   . Diabetes Paternal Grandmother   . Cancer Paternal Grandmother     Social History Social History   Tobacco Use  . Smoking status: Current Some Day Smoker    Packs/day: 0.50  . Smokeless tobacco: Never Used  . Tobacco comment: 4 cigarettes per day  Substance Use Topics  . Alcohol use: Yes    Comment: occassional  . Drug use: Yes    Types: Marijuana     Allergies   Ortho tri-cyclen [norgestimate-eth estradiol]   Review of Systems Review of Systems   Physical Exam Triage Vital Signs ED Triage Vitals  Enc Vitals Group     BP 08/25/19 1149 (!) 130/99     Pulse Rate 08/25/19 1149 83     Resp 08/25/19 1149 18     Temp 08/25/19 1149 98.6 F (37 C)     Temp Source 08/25/19 1149 Oral     SpO2 08/25/19 1149 99 %     Weight --      Height --      Head Circumference --      Peak Flow --      Pain Score 08/25/19 1150 6     Pain Loc --      Pain Edu? --      Excl. in GC? --    No data found.  Updated Vital Signs BP (!) 130/99 (BP Location: Right Arm)   Pulse 83   Temp 98.6 F (37 C) (Oral)   Resp 18   LMP 08/23/2019 (Exact Date)   SpO2 99%   Visual Acuity Right Eye Distance:   Left Eye Distance:   Bilateral Distance:    Right Eye Near:   Left Eye Near:    Bilateral Near:     Physical Exam Constitutional:      General: She is not in acute distress.    Appearance: She is well-developed.  Cardiovascular:      Rate and Rhythm: Normal rate.  Pulmonary:     Effort: Pulmonary effort is normal.  Musculoskeletal:     Right ankle: Normal.     Left ankle: Normal.     Left foot: Normal range of motion. Swelling, tenderness and bony tenderness present.     Comments: Tenderness to distal and middle phalanges of 3rd-5th toes of left foot; minimal MTP joint pain or swelling; cap refill < 2 seconds    Skin:    General: Skin is warm and dry.  Neurological:     Mental Status: She is alert and oriented to person, place, and time.      UC Treatments / Results  Labs (all labs ordered are listed, but only abnormal results are displayed) Labs Reviewed - No data to display  EKG   Radiology DG Foot Complete Left  Result Date: 08/25/2019 CLINICAL DATA:  Status post trauma to the third through fifth toe with pain. EXAM: LEFT FOOT - COMPLETE 3+ VIEW COMPARISON:  None. FINDINGS: There is displaced intra-articular fracture at the base of the fourth middle phalanx. No other acute fracture or dislocations identified. IMPRESSION: Displaced intra-articular fracture at the base of the fourth middle phalanx. Electronically Signed   By: Sherian Rein M.D.   On: 08/25/2019 12:55    Procedures Procedures (including critical care time)  Medications Ordered in UC Medications - No data to display  Initial Impression / Assessment and Plan / UC Course  I have reviewed the triage vital signs and the nursing notes.  Pertinent labs & imaging results that were available during my care of the patient were reviewed by me and considered in my medical decision making (see chart for details).     4th middle phalanx displaced intra-articular fracture seen on xray. Post op shoe provided, pain management discussed. Follow up with orthopedics. Work note provided. Patient verbalized understanding and agreeable to plan.   Final Clinical Impressions(s) / UC Diagnoses   Final diagnoses:  Closed displaced fracture of middle phalanx  of lesser toe of left foot, initial encounter     Discharge Instructions     You do have a fracture of your  4th toe.  This is treated with immobilization with the shoe we have provided you.  Limited on foot activity for the next week to help with pain. Ice, elevation, naproxen twice a day (take with food) to help with pain.  Follow up with orthopedics in the next few weeks for recheck or with your primary care provider.    ED Prescriptions    Medication Sig Dispense Auth. Provider   naproxen (NAPROSYN) 500 MG tablet Take 1 tablet (500 mg total) by mouth 2 (two) times daily. 30 tablet Georgetta Haber, NP     PDMP not reviewed this encounter.   Georgetta Haber, NP 08/26/19 1212

## 2019-09-15 ENCOUNTER — Encounter (HOSPITAL_COMMUNITY): Payer: Self-pay

## 2019-09-15 ENCOUNTER — Ambulatory Visit (HOSPITAL_COMMUNITY)
Admission: EM | Admit: 2019-09-15 | Discharge: 2019-09-15 | Disposition: A | Discharge status: Home / Self Care | Payer: Medicaid Other | Attending: Internal Medicine | Admitting: Internal Medicine

## 2019-09-15 ENCOUNTER — Other Ambulatory Visit: Payer: Self-pay

## 2019-09-15 DIAGNOSIS — M79672 Pain in left foot: Secondary | ICD-10-CM

## 2019-09-15 MED ORDER — ACETAMINOPHEN 500 MG PO TABS: 500.0000 mg | ORAL_TABLET | Freq: Four times a day (QID) | ORAL | 0 refills | Status: DC | PRN

## 2019-09-15 NOTE — ED Triage Notes (Addendum)
Pt presents to UC with pain x 10 days approx. Pt reports she was seen here 10 days ago and she was diagnose with a fracture in the 4th toe of left foot,. Pt reports her 4th toe of the left foot it was purple when she woke up this morning. Pt states the Post Op shoe she is wearing is not helping with the pain. Pt have an appointment  with Orthopedics in 2 weeks.

## 2019-09-16 NOTE — ED Provider Notes (Signed)
[facility]    CSN: [id number] Arrival date & time: 09/15/19  1215      History   Chief Complaint Chief Complaint  Patient presents with  . Toe Injury    HPI Jenna Henry is a 24 y.o. female comes to the urgent care with complaints of left foot pain for few days duration.  Patient sustained a intra-articular fracture of the fourth toe patient has been wearing the postop boot for living.  Patient complains of pain after she stands for prolonged well.   No swelling of the leg.  She has not tried any over-the-counter medications.  No numbness or tingling.  Patient is wearing postop boot.   No weakness in the lower extremities. HPI  Past Medical History:  Diagnosis Date  . Anxiety   . Asthma   . Depression   . Herpes   . Polycystic ovaries     Patient Active Problem List   Diagnosis Date Noted  . Bipolar disorder (HCC) 10/11/2018  . MDD (major depressive disorder) 10/10/2018  . Intentional drug overdose (HCC) 10/09/2018  . Hypokalemia 10/09/2018  . 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 Palpitations 11/09/2017  . Chest pain 11/09/2017  . Cigarette smoker 11/09/2017  . Severe major depression without psychotic features (HCC) 02/28/2017  . 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 Suicide attempt (HCC) 02/28/2017  . Anxiety state 02/28/2017  . Insomnia 02/28/2017  . Herpes 11/12/2016  . Irregular menstruation 07/06/2016    Past Surgical History:  Procedure Laterality Date  . CYSTECTOMY      OB History    Gravida  0   Para  0   Term  0   Preterm  0   AB  0   Living  0     SAB  0   TAB  0   Ectopic  0   Multiple  0   Live Births  0            Home Medications    Prior to Admission medications   Medication Sig Start Date End Date Taking? Authorizing Provider  acetaminophen (TYLENOL) 500 MG tablet Take 1 tablet (500 mg total) by mouth every 6 (six) hours as needed. 09/15/19   Sama Arauz,  Britta Mccreedy, MD  albuterol (VENTOLIN HFA) 108 (90 Base) MCG/ACT inhaler Inhale 1-2 puffs into the lungs every 6 (six) hours as needed for wheezing or shortness of breath. 10/13/18   Money, Gerlene Burdock, FNP  naproxen (NAPROSYN) 500 MG tablet Take 1 tablet (500 mg total) by mouth 2 (two) times daily. 08/25/19   Georgetta Haber, NP  venlafaxine XR (EFFEXOR-XR) 75 MG 24 hr capsule Take 1 capsule (75 mg total) by mouth daily with breakfast. Patient not taking: Reported on 02/22/2019 10/14/18 04/12/19  Money, Gerlene Burdock, FNP    Family History Family History  Problem Relation Age of Onset  . Hypertension Mother   . Anxiety disorder Mother   . Heart murmur Father   . Heart disease Maternal Grandfather   . Diabetes Paternal Grandmother   . Cancer Paternal Grandmother     Social History Social History   Tobacco Use  . Smoking status: Current Some Day Smoker    Packs/day: 0.50  . Smokeless tobacco: Never Used  . Tobacco comment: 4 cigarettes per day  Substance Use Topics  . Alcohol use: Yes    Comment: occassional  . Drug use: Yes    Types: Marijuana  Allergies   Ortho tri-cyclen [norgestimate-eth estradiol]   Review of Systems Review of Systems  Constitutional: Negative.   Respiratory: Negative.   Cardiovascular: Negative.   Gastrointestinal: Negative.   Genitourinary: Negative.   Musculoskeletal: Positive for arthralgias, joint swelling and myalgias.  Skin: Negative.   Neurological: Negative.   Psychiatric/Behavioral: Negative.      Physical Exam Triage Vital Signs ED Triage Vitals  Enc Vitals Group     BP 09/15/19 1251 101/64     Pulse Rate 09/15/19 1251 63     Resp 09/15/19 1251 16     Temp 09/15/19 1251 98.3 F (36.8 C)     Temp Source 09/15/19 1251 Oral     SpO2 09/15/19 1251 100 %     Weight --      Height --      Head Circumference --      Peak Flow --      Pain Score 09/15/19 1250 10     Pain Loc --      Pain Edu? --      Excl. in [place]? --    No data  found.  Updated Vital Signs BP 101/64 (BP Location: Right Arm)   Pulse 63   Temp 98.3 F (36.8 C) (Oral)   Resp 16   LMP 08/23/2019 (Exact Date)   SpO2 100%   Visual Acuity Right Eye Distance:   Left Eye Distance:   Bilateral Distance:    Right Eye Near:   Left Eye Near:    Bilateral Near:     Physical Exam Constitutional:      Appearance: Normal appearance.  Cardiovascular:     Rate and Rhythm: Normal rate and regular rhythm.     Pulses: Normal pulses.     Heart sounds: Normal heart sounds.  Musculoskeletal:        General: Tenderness present. No swelling, deformity or signs of injury. Normal range of motion.     Right lower leg: No edema.     Left lower leg: No edema.  Skin:    General: Skin is warm.     Capillary Refill: Capillary refill takes less than 2 seconds.     Findings: No bruising or erythema.  Neurological:     General: No focal deficit present.     Mental Status: She is alert and oriented to person, place, and time.      UC Treatments / Results  Labs (all labs ordered are listed, but only abnormal results are displayed) Labs Reviewed - No data to display  EKG   Radiology No results found.  Procedures Procedures (including critical care time)  Medications Ordered in UC Medications - No data to display  Initial Impression / Assessment and Plan / UC Course  I have reviewed the triage vital signs and the nursing notes.  Pertinent labs & imaging results that were available during my care of the patient were reviewed by me and considered in my medical decision making (see chart for details).     1.  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  She is advised to take breaks between long hours Gentle range of motion exercises Return precautions given. Final Clinical Impressions(s) / UC Diagnoses   Final diagnoses:  Mechanical pain of left foot   Discharge Instructions   None    ED  Prescriptions    Medication Sig Dispense Auth. Provider   acetaminophen (TYLENOL) 500 MG tablet Take  1 tablet (500 mg total) by mouth every 6 (six) hours as needed. 30 tablet Markan Cazarez, Britta Mccreedy, MD     PDMP not reviewed this encounter.   Merrilee Jansky, MD 09/16/19 2256

## 2019-09-19 ENCOUNTER — Ambulatory Visit (HOSPITAL_COMMUNITY)
Admission: EM | Admit: 2019-09-19 | Discharge: 2019-09-19 | Disposition: A | Discharge status: Home / Self Care | Payer: Medicaid Other | Attending: Urgent Care | Admitting: Urgent Care

## 2019-09-19 ENCOUNTER — Encounter (HOSPITAL_COMMUNITY): Payer: Self-pay

## 2019-09-19 ENCOUNTER — Ambulatory Visit (INDEPENDENT_AMBULATORY_CARE_PROVIDER_SITE_OTHER): Payer: Medicaid Other

## 2019-09-19 ENCOUNTER — Other Ambulatory Visit: Payer: Self-pay

## 2019-09-19 DIAGNOSIS — Z8249 Family history of ischemic heart disease and other diseases of the circulatory system: Secondary | ICD-10-CM | POA: Diagnosis not present

## 2019-09-19 DIAGNOSIS — F17210 Nicotine dependence, cigarettes, uncomplicated: Secondary | ICD-10-CM | POA: Insufficient documentation

## 2019-09-19 DIAGNOSIS — R0789 Other chest pain: Secondary | ICD-10-CM | POA: Diagnosis not present

## 2019-09-19 DIAGNOSIS — Z888 Allergy status to other drugs, medicaments and biological substances status: Secondary | ICD-10-CM | POA: Insufficient documentation

## 2019-09-19 DIAGNOSIS — Z79899 Other long term (current) drug therapy: Secondary | ICD-10-CM | POA: Diagnosis not present

## 2019-09-19 DIAGNOSIS — J4530 Mild persistent asthma, uncomplicated: Secondary | ICD-10-CM | POA: Diagnosis not present

## 2019-09-19 DIAGNOSIS — R079 Chest pain, unspecified: Secondary | ICD-10-CM | POA: Diagnosis present

## 2019-09-19 DIAGNOSIS — R0602 Shortness of breath: Secondary | ICD-10-CM

## 2019-09-19 DIAGNOSIS — Z20822 Contact with and (suspected) exposure to covid-19: Secondary | ICD-10-CM | POA: Diagnosis not present

## 2019-09-19 MED ORDER — NAPROXEN 500 MG PO TABS: 500.0000 mg | ORAL_TABLET | Freq: Two times a day (BID) | ORAL | 0 refills | Status: DC

## 2019-09-19 MED ORDER — TIZANIDINE HCL 4 MG PO TABS: 4.0000 mg | ORAL_TABLET | Freq: Three times a day (TID) | ORAL | 0 refills | Status: DC | PRN

## 2019-09-19 MED ORDER — ALBUTEROL SULFATE HFA 108 (90 BASE) MCG/ACT IN AERS: 1.0000 | INHALATION_SPRAY | Freq: Four times a day (QID) | RESPIRATORY_TRACT | 0 refills | Status: DC | PRN

## 2019-09-19 NOTE — ED Triage Notes (Signed)
Pt c/o 10/10 sharp non radiating chest pain on right and left chestx1 wk. Pt states its tender to the touch. Pt states sometimes when she's just sitting there she gets a sharp pain in right and left chest. Pt states she gets SOB when having pain in the chest. Pt has non labored breathing. Pt skin is dry and skin color WNL.

## 2019-09-19 NOTE — ED Notes (Signed)
Accessed patient's chart to obtain the Covid Test orders

## 2019-09-19 NOTE — ED Provider Notes (Signed)
[facility]   MRN: [id number] DOB: Feb 20, 1996  Subjective:   Jenna Henry is a 24 y.o. female presenting for 1 week history acute onset severe chest wall pain that elicited shortness of breath.  Patient has also had a mild intermittent cough which is normal for given that she is a smoker.  Smokes about 1/4 pack/day.  Patient has a history of asthma and ran out of her albuterol inhaler.  She has used some ibuprofen without any relief of her chest pain.  Denies trauma, fever, sore throat, sinus congestion, wheezing, nausea, vomiting, belly pain, urinary symptoms, body aches.  Patient has had general exposure to COVID-19, states that she works at [employer].  Denies any known direct COVID-19 contacts.  No current facility-administered medications for this encounter.  Current Outpatient Medications:  .  acetaminophen (TYLENOL) 500 MG tablet, Take 1 tablet (500 mg total) by mouth every 6 (six) hours as needed., Disp: 30 tablet, Rfl: 0 .  albuterol (VENTOLIN HFA) 108 (90 Base) MCG/ACT inhaler, Inhale 1-2 puffs into the lungs every 6 (six) hours as needed for wheezing or shortness of breath., Disp: 1 Inhaler, Rfl: 0 .  naproxen (NAPROSYN) 500 MG tablet, Take 1 tablet (500 mg total) by mouth 2 (two) times daily., Disp: 30 tablet, Rfl: 0   Allergies  Allergen Reactions  . Ortho Tri-Cyclen [Norgestimate-Eth Estradiol] Hives, Itching, Rash and Other (See Comments)    "Burning all over"    Past Medical History:  Diagnosis Date  . Anxiety   . Asthma   . Depression   . Herpes   . Polycystic ovaries      Past Surgical History:  Procedure Laterality Date  . CYSTECTOMY      Family History  Problem Relation Age of Onset  . Hypertension Mother   . Anxiety disorder Mother   . Heart murmur Father   . Heart disease Maternal Grandfather   . Diabetes Paternal Grandmother   . Cancer Paternal Grandmother     Social History   Tobacco Use  . Smoking status: Current Some Day Smoker   Packs/day: 0.50  . Smokeless tobacco: Never Used  . Tobacco comment: 4 cigarettes per day  Substance Use Topics  . Alcohol use: Yes    Comment: occassional  . Drug use: Yes    Types: Marijuana    ROS   Objective:   Vitals: BP 109/73   Pulse 72   Temp 98.4 F (36.9 C) (Oral)   Ht 5\' 5"  (1.651 m)   Wt 193 lb (87.5 kg)   LMP 08/23/2019 (Exact Date) Comment: negative pregnancy test prior to CXR  SpO2 100%   BMI 32.12 kg/m   Physical Exam Constitutional:      General: She is not in acute distress.    Appearance: Normal appearance. She is well-developed. She is not ill-appearing, toxic-appearing or diaphoretic.  HENT:     Head: Normocephalic and atraumatic.     Nose: Nose normal. No congestion or rhinorrhea.     Mouth/Throat:     Mouth: Mucous membranes are moist.  Eyes:     General: No scleral icterus.       Right eye: No discharge.        Left eye: No discharge.     Extraocular Movements: Extraocular movements intact.     Conjunctiva/sclera: Conjunctivae normal.     Pupils: Pupils are equal, round, and reactive to light.  Cardiovascular:     Rate and Rhythm: Normal rate and regular rhythm.  Pulses: Normal pulses.     Heart sounds: Normal heart sounds. No murmur. No friction rub. No gallop.   Pulmonary:     Effort: Pulmonary effort is normal. No respiratory distress.     Breath sounds: Normal breath sounds. No stridor. No wheezing, rhonchi or rales.  Skin:    General: Skin is warm and dry.     Findings: No rash.  Neurological:     Mental Status: She is alert and oriented to person, place, and time.  Psychiatric:        Mood and Affect: Mood normal.        Behavior: Behavior normal.        Thought Content: Thought content normal.        Judgment: Judgment normal.    DG Chest 2 View  Result Date: 09/19/2019 CLINICAL DATA:  Chest pain. EXAM: CHEST - 2 VIEW COMPARISON:  05/21/2019 FINDINGS: The cardiac silhouette is upper limits of normal in size. The lungs  are well inflated. No airspace consolidation, edema, pleural effusion, pneumothorax is identified. No acute osseous abnormality is seen. IMPRESSION: No active cardiopulmonary disease. Electronically Signed   By: Logan Bores M.D.   On: 09/19/2019 09:16   Urine pregnancy test completed prior to the x-ray.  ED ECG REPORT   Date: 09/19/2019  Rate: 58bpm  Rhythm: sinus bradycardia  QRS Axis: normal  Intervals: normal  ST/T Wave abnormalities: normal  Conduction Disutrbances:none  Narrative Interpretation: Sinus bradycardia at 58 bpm.  Largely unchanged from previous EKG apart from bradycardia.  Old EKG Reviewed: unchanged  I have personally reviewed the EKG tracing and agree with the computerized printout as noted.   Assessment and Plan :   PDMP not reviewed this encounter.  1. Atypical chest pain   2. Chest wall pain   3. Mild persistent asthma without complication   4. Shortness of breath     COVID-19 testing pending.  Patient is exquisitely tender to superficial touch on exam.  Recommended using naproxen, muscle relaxant and refilled her albuterol inhaler.  EKG and chest x-ray very reassuring, vital signs very stable for discharge.  Note for work provided to the patient. Counseled patient on potential for adverse effects with medications prescribed/recommended today, ER and return-to-clinic precautions discussed, patient verbalized understanding.    Jaynee Eagles, [place] 09/19/19 [phone]

## 2019-09-20 LAB — SARS CORONAVIRUS 2 (TAT 6-24 HRS): SARS Coronavirus 2: NEGATIVE

## 2019-11-11 IMAGING — CT CT HEAD W/O CM
4 series · 16 of 47 positions shown, 18 images · non-contrast
Comparison: None available

CLINICAL DATA: Acute headache, trauma

EXAM:
CT HEAD WITHOUT CONTRAST
TECHNIQUE: Contiguous axial images were obtained from the base of the skull
through the vertex without intravenous contrast.

[Series 3: head wo · axial · 0.42mm/px · z∈[-98,+12]mm · 7 of 30 slices shown, 9 images]
[im 4/30  brain]
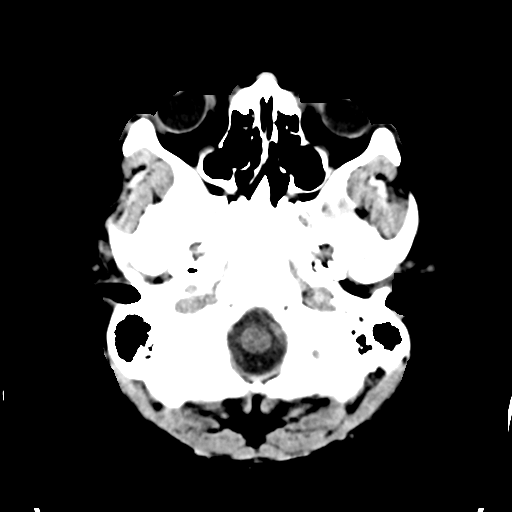
[im 4/30  bone]
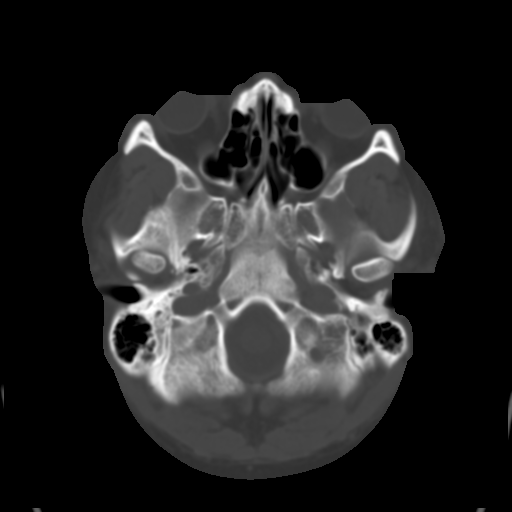
[im 8/30  brain]
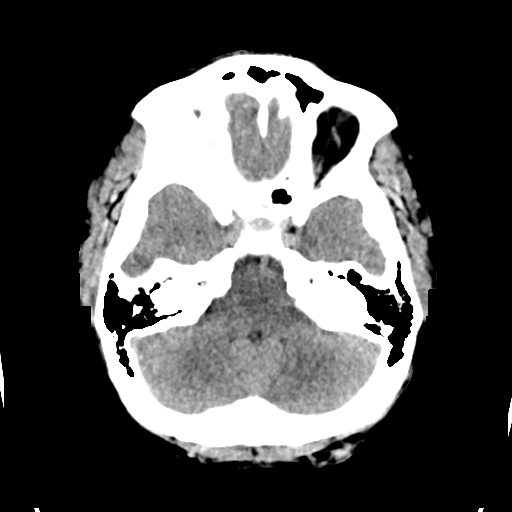
[im 11/30  brain]
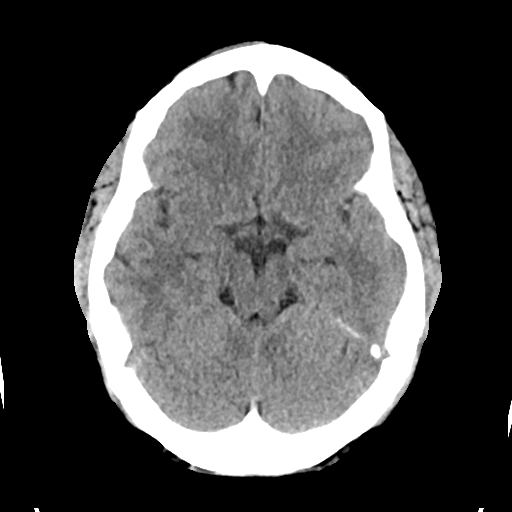
[im 15/30  brain]
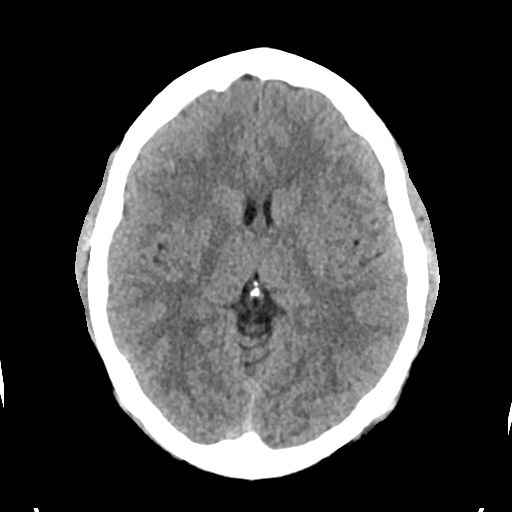
[im 19/30  brain]
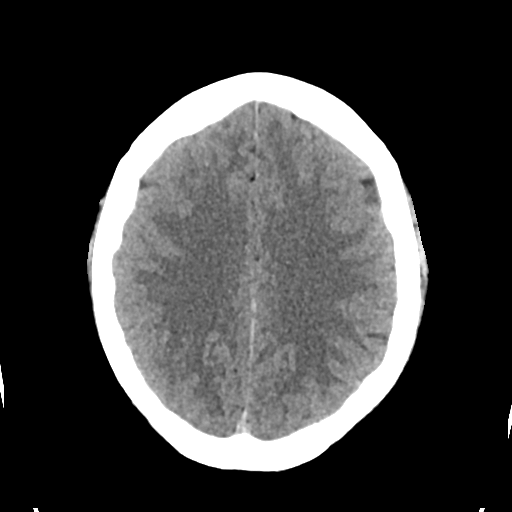
[im 19/30  bone]
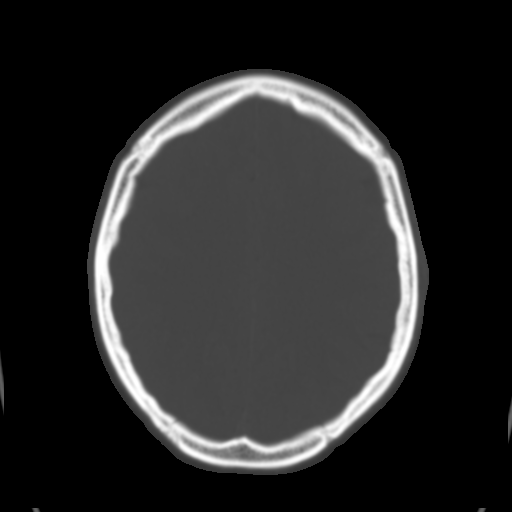
[im 22/30  brain]
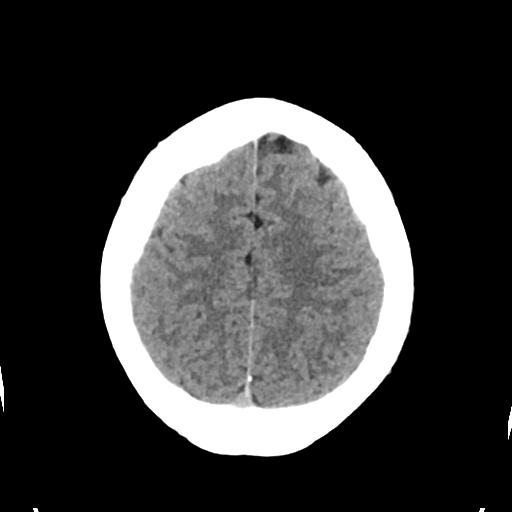
[im 26/30  brain]
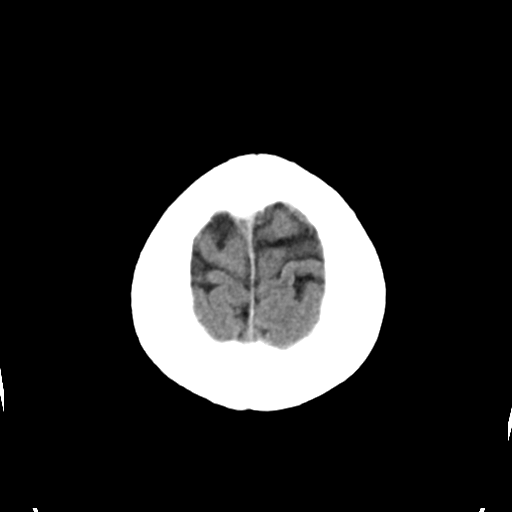

[Series 4: head bone · axial · 0.42mm/px · z∈[-98,-70]mm · 3 of 73 slices shown]
[im 8/73  bone]
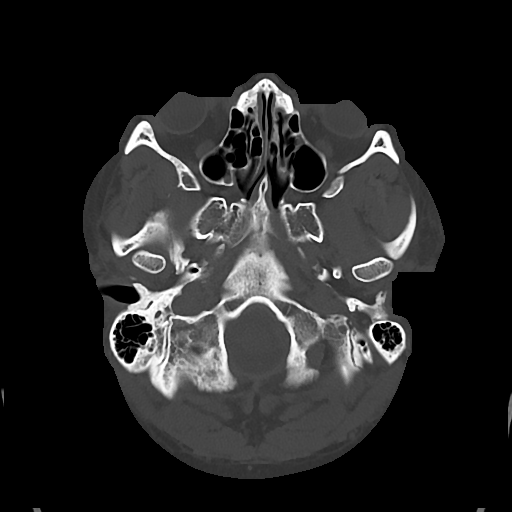
[im 15/73  bone]
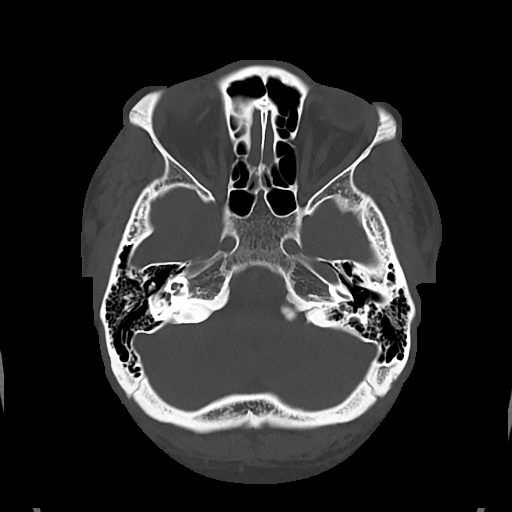
[im 22/73  bone]
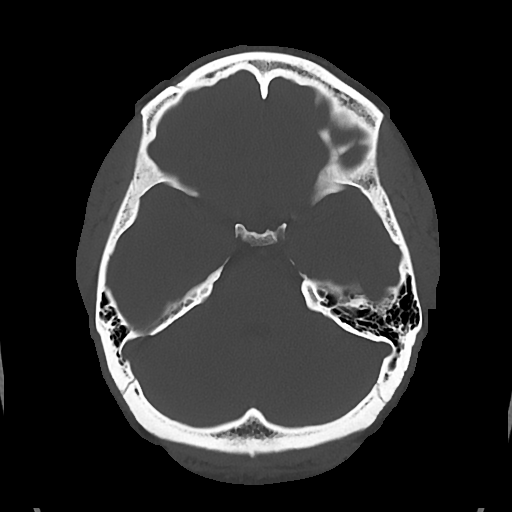

[Series 5: cor soft · coronal · 0.31mm/px · 3 of 67 slices shown]
[im 23/67  brain]
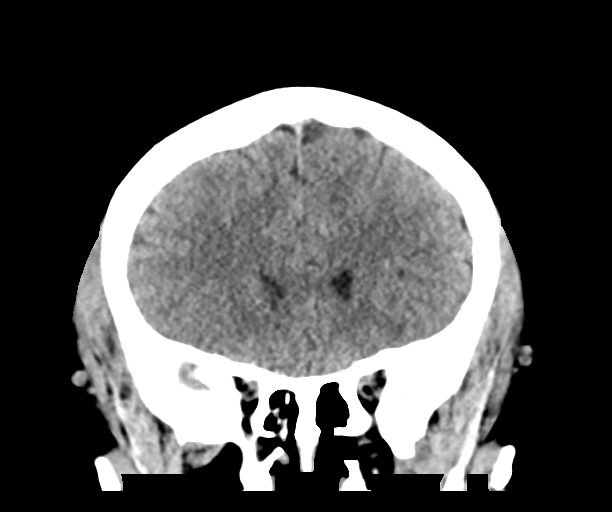
[im 30/67  brain]
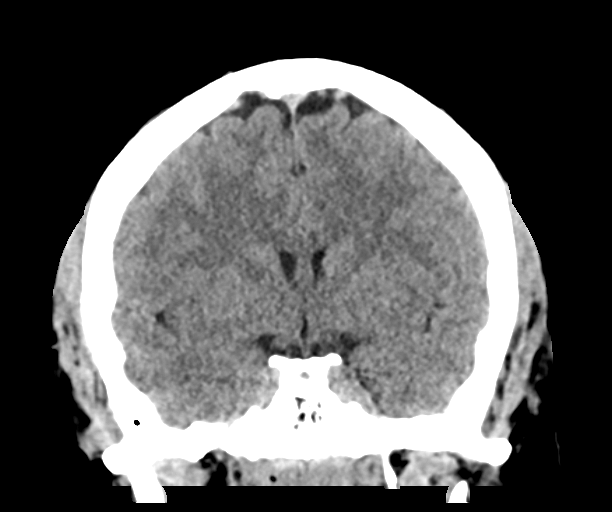
[im 37/67  brain]
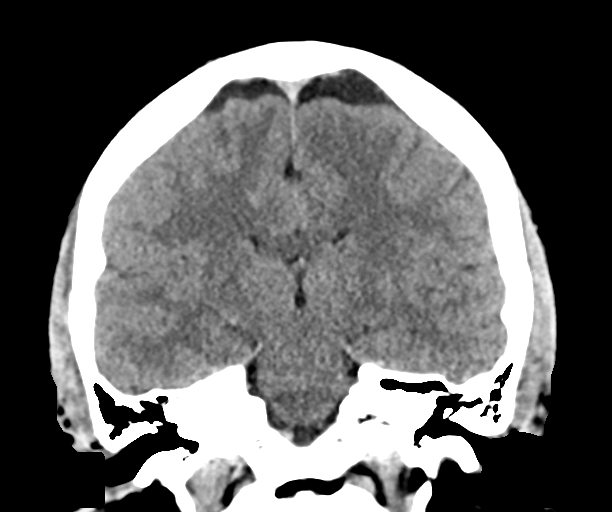

[Series 6: sag soft · sagittal · 0.32mm/px · 3 of 60 slices shown]
[im 20/60  brain]
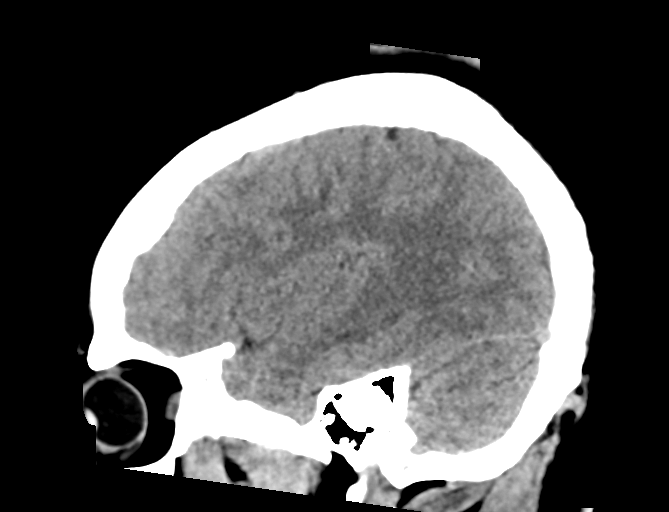
[im 30/60  brain]
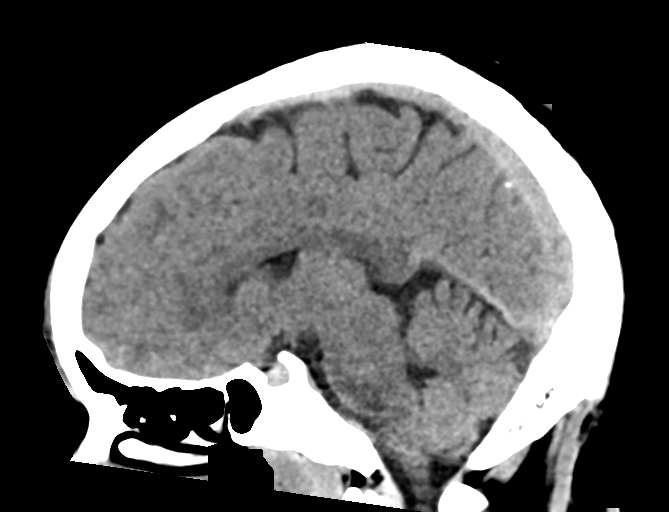
[im 40/60  brain]
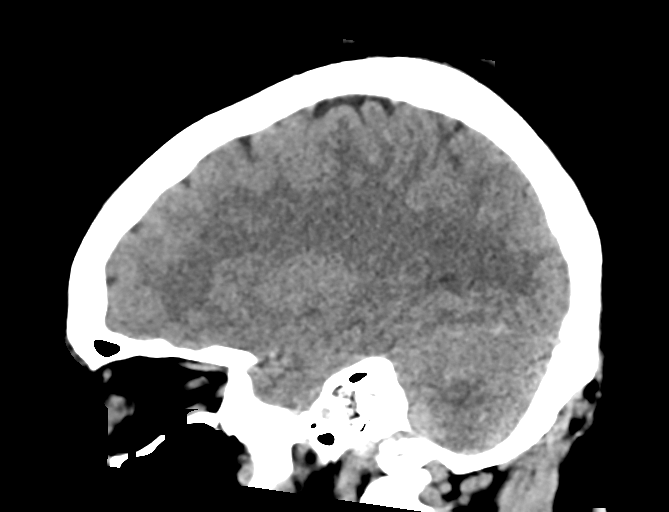

[16 of 47 positions shown; findings below may reference images not displayed]

FINDINGS: Brain: No evidence of acute infarction, hemorrhage, hydrocephalus,
extra-axial collection or mass lesion/mass effect.

Vascular: No hyperdense vessel or unexpected calcification.

Skull: Normal. Negative for fracture or focal lesion.

Sinuses/Orbits: No acute finding.

Other: None.
IMPRESSION: Normal head CT without contrast

## 2019-12-03 ENCOUNTER — Emergency Department (HOSPITAL_COMMUNITY): Payer: Self-pay

## 2019-12-03 ENCOUNTER — Encounter (HOSPITAL_COMMUNITY): Payer: Self-pay | Admitting: Emergency Medicine

## 2019-12-03 ENCOUNTER — Other Ambulatory Visit: Payer: Self-pay

## 2019-12-03 ENCOUNTER — Emergency Department (HOSPITAL_COMMUNITY)
Admission: EM | Admit: 2019-12-03 | Discharge: 2019-12-03 | Disposition: A | Discharge status: Home / Self Care | Payer: Self-pay | Attending: Emergency Medicine | Admitting: Emergency Medicine

## 2019-12-03 DIAGNOSIS — F17210 Nicotine dependence, cigarettes, uncomplicated: Secondary | ICD-10-CM | POA: Insufficient documentation

## 2019-12-03 DIAGNOSIS — R1030 Lower abdominal pain, unspecified: Secondary | ICD-10-CM | POA: Insufficient documentation

## 2019-12-03 DIAGNOSIS — J45909 Unspecified asthma, uncomplicated: Secondary | ICD-10-CM | POA: Insufficient documentation

## 2019-12-03 DIAGNOSIS — R0789 Other chest pain: Secondary | ICD-10-CM | POA: Insufficient documentation

## 2019-12-03 DIAGNOSIS — R519 Headache, unspecified: Secondary | ICD-10-CM | POA: Insufficient documentation

## 2019-12-03 LAB — COMPREHENSIVE METABOLIC PANEL
ALT: 15 U/L (ref 0–44)
AST: 22 U/L (ref 15–41)
Albumin: 4.2 g/dL (ref 3.5–5.0)
Alkaline Phosphatase: 61 U/L (ref 38–126)
Anion gap: 11 (ref 5–15)
BUN: 6 mg/dL (ref 6–20)
CO2: 19 mmol/L — ABNORMAL LOW (ref 22–32)
Calcium: 10.4 mg/dL — ABNORMAL HIGH (ref 8.9–10.3)
Chloride: 108 mmol/L (ref 98–111)
Creatinine, Ser: 0.76 mg/dL (ref 0.44–1.00)
GFR calc Af Amer: >60 mL/min (ref >60)
GFR calc non Af Amer: >60 mL/min (ref >60)
Glucose, Bld: 69 mg/dL — ABNORMAL LOW (ref 70–99)
Potassium: 3.7 mmol/L (ref 3.5–5.1)
Sodium: 138 mmol/L (ref 135–145)
Total Bilirubin: 0.7 mg/dL (ref 0.3–1.2)
Total Protein: 7.3 g/dL (ref 6.5–8.1)

## 2019-12-03 LAB — URINALYSIS, ROUTINE W REFLEX MICROSCOPIC
Bilirubin Urine: NEGATIVE
Glucose, UA: NEGATIVE mg/dL
Ketones, ur: NEGATIVE mg/dL
Leukocytes,Ua: NEGATIVE
Nitrite: NEGATIVE
Protein, ur: 30 mg/dL — AB
RBC / HPF: >50 RBC/hpf — ABNORMAL HIGH (ref 0–5)
Specific Gravity, Urine: 1.019 (ref 1.005–1.030)
pH: 6 (ref 5.0–8.0)

## 2019-12-03 LAB — CBC
HCT: 43.4 % (ref 36.0–46.0)
Hemoglobin: 14.2 g/dL (ref 12.0–15.0)
MCH: 32.5 pg (ref 26.0–34.0)
MCHC: 32.7 g/dL (ref 30.0–36.0)
MCV: 99.3 fL (ref 80.0–100.0)
Platelets: 240 10*3/uL (ref 150–400)
RBC: 4.37 MIL/uL (ref 3.87–5.11)
RDW: 12.8 % (ref 11.5–15.5)
WBC: 5.6 10*3/uL (ref 4.0–10.5)
nRBC: 0 % (ref 0.0–0.2)

## 2019-12-03 LAB — I-STAT BETA HCG BLOOD, ED (MC, WL, AP ONLY): I-stat hCG, quantitative: <5 m[IU]/mL (ref <5)

## 2019-12-03 LAB — LIPASE, BLOOD: Lipase: 28 U/L (ref 11–51)

## 2019-12-03 MED ORDER — METOCLOPRAMIDE HCL 5 MG/ML IJ SOLN
10.0000 mg | Freq: Once | INTRAMUSCULAR | Status: AC
  Administered 2019-12-03: 10 mg via INTRAVENOUS
  Filled 2019-12-03: qty 2

## 2019-12-03 MED ORDER — SODIUM CHLORIDE 0.9% FLUSH
3.0000 mL | Freq: Once | INTRAVENOUS | Status: AC
  Administered 2019-12-03: 3 mL via INTRAVENOUS

## 2019-12-03 MED ORDER — NAPROXEN 500 MG PO TABS
500.0000 mg | ORAL_TABLET | Freq: Two times a day (BID) | ORAL | 0 refills | Status: DC
Start: 2019-12-03 — End: 2019-12-11

## 2019-12-03 MED ORDER — DIPHENHYDRAMINE HCL 50 MG/ML IJ SOLN
25.0000 mg | Freq: Once | INTRAMUSCULAR | Status: AC
  Administered 2019-12-03: 25 mg via INTRAVENOUS
  Filled 2019-12-03: qty 1

## 2019-12-03 MED ORDER — IOHEXOL 300 MG/ML  SOLN
100.0000 mL | Freq: Once | INTRAMUSCULAR | Status: AC | PRN
  Administered 2019-12-03: 100 mL via INTRAVENOUS

## 2019-12-03 NOTE — Discharge Instructions (Signed)
Follow-up with your primary care provider. Return to the ER for worsening chest pain, abdominal pain, shortness of breath, leg swelling.

## 2019-12-03 NOTE — ED Triage Notes (Signed)
C/o lower abd pain since this morning.  Since then reports SOB, chest pain, and numbness/tingling all over body.  States her throat feels tight.  Pt appears anxious.  Encouraged pt to concentrate on taking slow deep breaths.

## 2019-12-03 NOTE — ED Provider Notes (Signed)
[facility]ENC[facility] Provider Note   CSN: [id number] Arrival date & time: 12/03/19  1140     History Chief Complaint  Patient presents with   Abdominal Pain   Chest Pain    Jenna Henry is a 24 y.o. female with a past medical history of PCOS, anxiety, asthma presenting to the ED with a chief complaint of abdominal pain and chest pain.  States that she woke up this morning with lower abdominal pain.  She went to take a shower to get ready for work when all of a sudden she began having worsening abdominal pain.  She also started experiencing "numbness throughout my entire body" as well as chest pain, shortness of breath and generalized weakness.  She called her mother who brought her to the ER.  States that she continues to have abdominal pain and generalized weakness.  Also reports headache.  Denies any nausea, vomiting, dysuria, vaginal discharge, abnormal vaginal bleeding or concern for STDs.  She does admit to daily marijuana use but denies any alcohol use.  She has not taken any medications to help with her symptoms.  Denies any prior abdominal surgeries.  No sick contacts with similar symptoms. Denies injuries, falls. Reports frequent bowel movements lately but denies any vomiting. 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   Suicide attempt (HCC) 02/28/2017   Anxiety state  02/28/2017   Insomnia 02/28/2017   Herpes 11/12/2016   Irregular menstruation 07/06/2016    Past Surgical History:  Procedure Laterality Date   CYSTECTOMY       OB History    Gravida  0   Para  0   Term  0   Preterm  0   AB  0   Living  0     SAB  0   TAB  0   Ectopic  0   Multiple  0   Live Births  0           Family History  Problem Relation Age of Onset   Hypertension Mother    Anxiety disorder Mother    Heart murmur Father    Heart disease Maternal Grandfather    Diabetes Paternal Grandmother    Cancer Paternal Grandmother     Social History   Tobacco Use   Smoking status: Current Some Day Smoker    Packs/day: 0.50   Smokeless tobacco: Never Used   Tobacco comment: 4 cigarettes per day  Vaping Use   Vaping Use: Never used  Substance Use Topics   Alcohol use: Yes    Comment: occassional   Drug use: Yes    Types: Marijuana    Home Medications Prior to Admission medications   Medication Sig Start Date End Date Taking? Authorizing Provider  acetaminophen (TYLENOL) 500 MG tablet Take 1 tablet (500 mg total) by mouth every 6 (six) hours as needed. 09/15/19   Lamptey,  Britta Mccreedy, MD  albuterol (VENTOLIN HFA) 108 (90 Base) MCG/ACT inhaler Inhale 1-2 puffs into the lungs every 6 (six) hours as needed for wheezing or shortness of breath. 09/19/19   Wallis Bamberg, PA-C  naproxen (NAPROSYN) 500 MG tablet Take 1 tablet (500 mg total) by mouth 2 (two) times daily. 12/03/19   Danzig Macgregor, PA-C  tiZANidine (ZANAFLEX) 4 MG tablet Take 1 tablet (4 mg total) by mouth every 8 (eight) hours as needed. 09/19/19   Wallis Bamberg, PA-C  venlafaxine XR (EFFEXOR-XR) 75 MG 24 hr capsule Take 1 capsule (75 mg total) by mouth daily with breakfast. Patient not taking: Reported on 02/22/2019 10/14/18 04/12/19  Money, Gerlene Burdock, FNP    Allergies    Ortho tri-cyclen [norgestimate-eth estradiol]  Review of Systems   Review of Systems  Constitutional: Positive for  fatigue. Negative for appetite change, chills and fever.  HENT: Negative for ear pain, rhinorrhea, sneezing and sore throat.   Eyes: Negative for photophobia and visual disturbance.  Respiratory: Negative for cough, chest tightness, shortness of breath and wheezing.   Cardiovascular: Negative for chest pain and palpitations.  Gastrointestinal: Positive for abdominal pain. Negative for blood in stool, constipation, diarrhea, nausea and vomiting.  Genitourinary: Negative for dysuria, hematuria and urgency.  Musculoskeletal: Negative for myalgias.  Skin: Negative for rash.  Neurological: Positive for headaches. Negative for dizziness, weakness and light-headedness.    Physical Exam Updated Vital Signs BP 113/67 (BP Location: Right Arm)    Pulse 66    Temp 97.9 F (36.6 C) (Axillary)    Resp (!) 24    Ht 5\' 5"  (1.651 m)    Wt 86.2 kg    LMP 12/02/2019    SpO2 100%    BMI 31.62 kg/m   Physical Exam Vitals and nursing note reviewed.  Constitutional:      General: She is not in acute distress.    Appearance: She is well-developed.  HENT:     Head: Normocephalic and atraumatic.     Nose: Nose normal.  Eyes:     General: No scleral icterus.       Left eye: No discharge.     Conjunctiva/sclera: Conjunctivae normal.  Cardiovascular:     Rate and Rhythm: Normal rate and regular rhythm.     Heart sounds: Normal heart sounds. No murmur heard.  No friction rub. No gallop.   Pulmonary:     Effort: Pulmonary effort is normal. No respiratory distress.     Breath sounds: Normal breath sounds.  Abdominal:     General: Bowel sounds are normal. There is no distension.     Palpations: Abdomen is soft.     Tenderness: There is abdominal tenderness in the right lower quadrant, suprapubic area and left lower quadrant. There is no guarding.  Musculoskeletal:        General: Normal range of motion.     Cervical back: Normal range of motion and neck supple.     Right lower leg: No edema.     Left  lower leg: No edema.  Skin:    General: Skin is warm and dry.     Findings: No rash.  Neurological:     Mental Status: She is alert and oriented to person, place, and time.     Cranial Nerves: No cranial nerve deficit.     Sensory: No sensory deficit.     Motor: No weakness or abnormal muscle tone.     Coordination: Coordination normal.  ED Results / Procedures / Treatments   Labs (all labs ordered are listed, but only abnormal results are displayed) Labs Reviewed  COMPREHENSIVE METABOLIC PANEL - Abnormal; Notable for the following components:      Result Value   CO2 19 (*)    Glucose, Bld 69 (*)    Calcium 10.4 (*)    All other components within normal limits  URINALYSIS, ROUTINE W REFLEX MICROSCOPIC - Abnormal; Notable for the following components:   APPearance HAZY (*)    Hgb urine dipstick LARGE (*)    Protein, ur 30 (*)    RBC / HPF >50 (*)    Bacteria, UA RARE (*)    All other components within normal limits  LIPASE, BLOOD  CBC  I-STAT BETA HCG BLOOD, ED ([facility], [facility], [facility] ONLY)    EKG None  Radiology DG Chest 2 View  Result Date: 12/03/2019 CLINICAL DATA:  Shortness of breath and chest pain EXAM: CHEST - 2 VIEW COMPARISON:  09/19/2019 FINDINGS: The heart size and mediastinal contours are within normal limits. Both lungs are clear. The visualized skeletal structures are unremarkable. IMPRESSION: No active cardiopulmonary disease. Electronically Signed   By: Judie Petit.  Shick M.D.   On: 12/03/2019 14:30   CT ABDOMEN PELVIS W CONTRAST  Result Date: 12/03/2019 CLINICAL DATA:  Right lower quadrant abdominal pain. EXAM: CT ABDOMEN AND PELVIS WITH CONTRAST TECHNIQUE: Multidetector CT imaging of the abdomen and pelvis was performed using the standard protocol following bolus administration of intravenous contrast. CONTRAST:  OMNIPAQUE IOHEXOL 300 MG/ML  SOLN COMPARISON:  CT abdomen pelvis dated 01/26/2018. FINDINGS: Lower chest: No acute abnormality. Hepatobiliary: No focal liver  abnormality is seen. No gallstones, gallbladder wall thickening, or biliary dilatation. Pancreas: Unremarkable. No pancreatic ductal dilatation or surrounding inflammatory changes. Spleen: Normal in size without focal abnormality. Adrenals/Urinary Tract: Adrenal glands are unremarkable. Kidneys are normal, without renal calculi, focal lesion, or hydronephrosis. Bladder is nondistended. Stomach/Bowel: Stomach is within normal limits. Appendix appears normal. No evidence of bowel wall thickening, distention, or inflammatory changes. Vascular/Lymphatic: No significant vascular findings are present. No enlarged abdominal or pelvic lymph nodes. Reproductive: Uterus and bilateral adnexa are unremarkable. Other: No abdominal wall hernia or abnormality. Trace fluid in the pelvis is likely physiologic. Musculoskeletal: No acute or significant osseous findings. IMPRESSION: No acute process in the abdomen or pelvis. Electronically Signed   By: Romona Curls M.D.   On: 12/03/2019 15:27    Procedures Procedures (including critical care time)  Medications Ordered in ED Medications  sodium chloride flush (NS) 0.9 % injection 3 mL (3 mLs Intravenous Given 12/03/19 1430)  metoCLOPramide (REGLAN) injection 10 mg (10 mg Intravenous Given 12/03/19 1430)  diphenhydrAMINE (BENADRYL) injection 25 mg (25 mg Intravenous Given 12/03/19 1429)  iohexol (OMNIPAQUE) 300 MG/ML solution 100 mL (100 mLs Intravenous Contrast Given 12/03/19 1514)    ED Course  I have reviewed the triage vital signs and the nursing notes.  Pertinent labs & imaging results that were available during my care of the patient were reviewed by me and considered in my medical decision making (see chart for details).    MDM Rules/Calculators/A&P                          24 year old female with a past medical history of PCOS, anxiety, asthma to the ED with a chief complaint of abdominal pain and chest pain.  Woke up this morning with lower abdominal pain.  After  she went to take a shower and started getting ready for work started having worsening pain as well as numbness throughout her entire body, chest pain, shortness of breath and generalized weakness.  Upon arrival to the ED continues to have generalized weakness and abdominal pain.  Also reports headache.  No neurological deficits noted on exam.  No neck stiffness or meningeal signs noted.  Abdomen is tender in the lower area without rebound or guarding.  Denies vaginal complaints, urinary symptoms or concern for STDs.  Reports daily marijuana use but denies alcohol use.  Work appears significant for urinalysis without signs of infection, and does show hematuria which I believe is due to her menstrual cycle.  CBC, lipase and CMP unremarkable.  hCG is negative.  EKG shows normal sinus rhythm, no ischemic changes.  Chest x-ray is unremarkable.  CT of the abdomen pelvis without any acute findings. Low suspicion for this being a pelvic cause as repeat abdominal exams are benign, she is resting comfortably with migraine cocktail. Declined pelvic. She remains hemodynamically stable.  She is afebrile without recent use of antipyretics.  She is PERC negative, low risk for ACS no evidence of pneumonia or pneumothorax or other emergent cause on work-up.  Question whether this could be due to anxiety versus viral illness.  Will prescribe NSAIDs as needed and follow-up with PCP.  All imaging, if done today, including plain films, CT scans, and ultrasounds, independently reviewed by me, and interpretations confirmed via formal radiology reads.  Patient is hemodynamically stable, in NAD, and able to ambulate in the ED. Evaluation does not show pathology that would require ongoing emergent intervention or inpatient treatment. I explained the diagnosis to the patient. Pain has been managed and has no complaints prior to discharge. Patient is comfortable with above plan and is stable for discharge at this time. All questions were  answered prior to disposition. Strict return precautions for returning to the ED were discussed. Encouraged follow up with PCP.   An After Visit Summary was printed and given to the patient.   Portions of this note were generated with Scientist, clinical (histocompatibility and immunogenetics). Dictation errors may occur despite best attempts at proofreading.  Final Clinical Impression(s) / ED Diagnoses Final diagnoses:  Chest wall pain    Rx / DC Orders ED Discharge Orders         Ordered    naproxen (NAPROSYN) 500 MG tablet  2 times daily     Discontinue  Reprint     12/03/19 1538           Dietrich Pates, PA-C 12/03/19 1538    Pollyann Savoy, MD 12/04/19 1510

## 2019-12-03 NOTE — ED Notes (Signed)
Patient transported to CT 

## 2019-12-11 ENCOUNTER — Encounter (HOSPITAL_COMMUNITY): Payer: Self-pay

## 2019-12-11 ENCOUNTER — Ambulatory Visit (HOSPITAL_COMMUNITY)
Admission: EM | Admit: 2019-12-11 | Discharge: 2019-12-11 | Disposition: A | Discharge status: Home / Self Care | Payer: Medicaid Other | Attending: Family Medicine | Admitting: Family Medicine

## 2019-12-11 ENCOUNTER — Other Ambulatory Visit: Payer: Self-pay

## 2019-12-11 DIAGNOSIS — G8929 Other chronic pain: Secondary | ICD-10-CM | POA: Insufficient documentation

## 2019-12-11 DIAGNOSIS — R1084 Generalized abdominal pain: Secondary | ICD-10-CM

## 2019-12-11 DIAGNOSIS — M549 Dorsalgia, unspecified: Secondary | ICD-10-CM

## 2019-12-11 DIAGNOSIS — Z3202 Encounter for pregnancy test, result negative: Secondary | ICD-10-CM

## 2019-12-11 DIAGNOSIS — R109 Unspecified abdominal pain: Secondary | ICD-10-CM

## 2019-12-11 LAB — POCT URINALYSIS DIP (DEVICE)
Bilirubin Urine: NEGATIVE
Glucose, UA: NEGATIVE mg/dL
Hgb urine dipstick: NEGATIVE
Ketones, ur: NEGATIVE mg/dL
Leukocytes,Ua: NEGATIVE
Nitrite: NEGATIVE
Protein, ur: NEGATIVE mg/dL
Specific Gravity, Urine: 1.025 (ref 1.005–1.030)
Urobilinogen, UA: 0.2 mg/dL (ref 0.0–1.0)
pH: 6 (ref 5.0–8.0)

## 2019-12-11 LAB — POC URINE PREG, ED: Preg Test, Ur: NEGATIVE

## 2019-12-11 MED ORDER — NAPROXEN 500 MG PO TABS
500.0000 mg | ORAL_TABLET | Freq: Two times a day (BID) | ORAL | 0 refills | Status: DC
Start: 2019-12-11 — End: 2020-03-28

## 2019-12-11 MED ORDER — ACETAMINOPHEN 500 MG PO TABS
500.0000 mg | ORAL_TABLET | Freq: Four times a day (QID) | ORAL | 0 refills | Status: DC | PRN
Start: 2019-12-11 — End: 2020-03-28

## 2019-12-11 NOTE — ED Provider Notes (Signed)
[facility]    CSN: [id number] Arrival date & time: 12/11/19  1012      History   Chief Complaint Chief Complaint  Patient presents with  . Abdominal Pain  . Back Pain    HPI Jenna Henry is a 24 y.o. female.   Patient history of chronic abdominal pains presents for evaluation of abdominal pain as well as low back pain.  She reports this has been going on for the last few days.  She reports symptoms started in her low back but seem to of migrated around to her belly.  She reports pain is sharp at times.  She did take Tylenol and this seemed to help.  Today she did feel like she had some pressure in her lower abdomen with urination.  However does not endorse pain or frequency or urgency.  She reports pain is primarily been in her lower part of her belly.  No change in bowel, no constipation or diarrhea.  No nausea or vomiting.  Denies any vaginal discharge.  Denies any fevers or chills.  She reports she did recently finish her menstrual cycle as it started on 7 /3 but she just came off of this.  She does report she gets a lot of cramping with this.  She reports she has been worked up numerous times for abdominal discomfort and has been told its polycystic ovaries or other causes.  Patient does not have a primary care provider.     Past Medical History:  Diagnosis Date  . Anxiety   . Asthma   . Depression   . Herpes   . Polycystic ovaries     Patient Active Problem List   Diagnosis Date Noted  . Bipolar disorder (HCC) 10/11/2018  . MDD (major depressive disorder) 10/10/2018  . Intentional drug overdose (HCC) 10/09/2018  . Hypokalemia 10/09/2018  . 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 Palpitations 11/09/2017  . Chest pain 11/09/2017  . Cigarette smoker 11/09/2017  . Severe major depression without psychotic features (HCC) 02/28/2017  . 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 Suicide attempt (HCC) 02/28/2017  .  Anxiety state 02/28/2017  . Insomnia 02/28/2017  . Herpes 11/12/2016  . Irregular menstruation 07/06/2016    Past Surgical History:  Procedure Laterality Date  . CYSTECTOMY      OB History    Gravida  0   Para  0   Term  0   Preterm  0   AB  0   Living  0     SAB  0   TAB  0   Ectopic  0   Multiple  0   Live Births  0            Home Medications    Prior to Admission medications   Medication Sig Start Date End Date Taking? Authorizing Provider  acetaminophen (TYLENOL) 500 MG tablet Take 1 tablet (500 mg total) by mouth every 6 (six) hours as needed. 12/11/19   Mikesha Migliaccio, Veryl Speak, PA-C  albuterol (VENTOLIN HFA) 108 (90 Base) MCG/ACT inhaler Inhale 1-2 puffs into the lungs every 6 (six) hours as needed for wheezing or shortness of breath. 09/19/19   Wallis Bamberg, PA-C  naproxen (NAPROSYN) 500 MG tablet Take 1 tablet (500 mg total) by mouth 2 (two) times daily. 12/11/19   Terrez Ander, Veryl Speak, PA-C  tiZANidine (ZANAFLEX) 4 MG tablet Take 1 tablet (4 mg total) by mouth every 8 (eight) hours as needed. 09/19/19  Wallis Bamberg, PA-C  venlafaxine XR (EFFEXOR-XR) 75 MG 24 hr capsule Take 1 capsule (75 mg total) by mouth daily with breakfast. Patient not taking: Reported on 02/22/2019 10/14/18 04/12/19  Money, Gerlene Burdock, FNP    Family History Family History  Problem Relation Age of Onset  . Hypertension Mother   . Anxiety disorder Mother   . Heart murmur Father   . Heart disease Maternal Grandfather   . Diabetes Paternal Grandmother   . Cancer Paternal Grandmother     Social History Social History   Tobacco Use  . Smoking status: Current Some Day Smoker    Packs/day: 0.50  . Smokeless tobacco: Never Used  . Tobacco comment: 4 cigarettes per day  Vaping Use  . Vaping Use: Never used  Substance Use Topics  . Alcohol use: Yes    Comment: occassional  . Drug use: Yes    Types: Marijuana     Allergies   Ortho tri-cyclen [norgestimate-eth estradiol]   Review of  Systems Review of Systems   Physical Exam Triage Vital Signs ED Triage Vitals  Enc Vitals Group     BP 12/11/19 1027 (!) 101/58     Pulse Rate 12/11/19 1027 78     Resp 12/11/19 1027 18     Temp 12/11/19 1027 97.9 F (36.6 C)     Temp Source 12/11/19 1027 Oral     SpO2 12/11/19 1027 100 %     Weight --      Height --      Head Circumference --      Peak Flow --      Pain Score 12/11/19 1024 8     Pain Loc --      Pain Edu? --      Excl. in GC? --    No data found.  Updated Vital Signs BP (!) 101/58 (BP Location: Right Arm)   Pulse 78   Temp 97.9 F (36.6 C) (Oral)   Resp 18   LMP 12/02/2019   SpO2 100%   Visual Acuity Right Eye Distance:   Left Eye Distance:   Bilateral Distance:    Right Eye Near:   Left Eye Near:    Bilateral Near:     Physical Exam Vitals and nursing note reviewed.  Constitutional:      General: She is not in acute distress.    Appearance: She is well-developed. She is not ill-appearing.  HENT:     Head: Normocephalic and atraumatic.  Eyes:     Conjunctiva/sclera: Conjunctivae normal.  Cardiovascular:     Rate and Rhythm: Normal rate and regular rhythm.     Heart sounds: No murmur heard.   Pulmonary:     Effort: Pulmonary effort is normal. No respiratory distress.     Breath sounds: Normal breath sounds.  Abdominal:     Palpations: Abdomen is soft.     Tenderness: There is generalized abdominal tenderness (There is some generalized tenderness throughout, though some emphasis on suprapubic.) and tenderness in the suprapubic area. There is no right CVA tenderness, left CVA tenderness, guarding or rebound. Negative signs include McBurney's sign.  Musculoskeletal:     Cervical back: Neck supple.     Comments: There is no significant tenderness in the low back.  Patient is ambulatory.  Skin:    General: Skin is warm and dry.  Neurological:     Mental Status: She is alert.      UC Treatments / Results  Labs (all labs  ordered  are listed, but only abnormal results are displayed) Labs Reviewed  URINE CULTURE  POCT URINALYSIS DIP (DEVICE)  POC URINE PREG, ED    EKG   Radiology No results found.  Procedures Procedures (including critical care time)  Medications Ordered in UC Medications - No data to display  Initial Impression / Assessment and Plan / UC Course  I have reviewed the triage vital signs and the nursing notes.  Pertinent labs & imaging results that were available during my care of the patient were reviewed by me and considered in my medical decision making (see chart for details).     #Chronic generalized abdominal pain Patient is a 24 year old presenting with abdominal discomfort.  UA without evidence of infection.  Will send culture.  Urine pregnancy negative.  Given this appears to be a chronic issue, do not feel there is any life-threatening causes today.  Discussed use of naproxen and Tylenol.  Encouraged her to follow-up with a primary care provider.  Also encouraged her to follow-up with OB/GYN.  Strict emergency department precautions were discussed.  Patient verbalized understanding agreement with plan. Final Clinical Impressions(s) / UC Diagnoses   Final diagnoses:  Chronic generalized abdominal pain     Discharge Instructions     We sent a urine culture and will let you know if results require treatment, otherwise this will be in your mychart  You need to establish with a primary care to discuss your chronic belly pains  Take the tylenol and naproxen as prescribed  If severe pain, fever, vomiting go to the Emergency Department      ED Prescriptions    Medication Sig Dispense Auth. Provider   naproxen (NAPROSYN) 500 MG tablet Take 1 tablet (500 mg total) by mouth 2 (two) times daily. 30 tablet Damonie Ellenwood, Veryl Speak, PA-C   acetaminophen (TYLENOL) 500 MG tablet Take 1 tablet (500 mg total) by mouth every 6 (six) hours as needed. 30 tablet Almena Hokenson, Veryl Speak, PA-C     PDMP not  reviewed this encounter.   Hermelinda Medicus, PA-C 12/11/19 2210

## 2019-12-11 NOTE — ED Triage Notes (Signed)
Pt is here with abdominal/back pain that started yesterday, pt has taken Tylenol to relieve discomfort.

## 2019-12-11 NOTE — Discharge Instructions (Signed)
We sent a urine culture and will let you know if results require treatment, otherwise this will be in your mychart  You need to establish with a primary care to discuss your chronic belly pains  Take the tylenol and naproxen as prescribed  If severe pain, fever, vomiting go to the Emergency Department

## 2019-12-12 LAB — URINE CULTURE: Culture: <10000 — AB

## 2019-12-14 ENCOUNTER — Other Ambulatory Visit: Payer: Self-pay

## 2019-12-14 ENCOUNTER — Encounter (HOSPITAL_COMMUNITY): Payer: Self-pay | Admitting: Pediatrics

## 2019-12-14 ENCOUNTER — Emergency Department (HOSPITAL_COMMUNITY)
Admission: EM | Admit: 2019-12-14 | Discharge: 2019-12-14 | Disposition: A | Discharge status: Home / Self Care | Payer: Medicaid Other | Attending: Emergency Medicine | Admitting: Emergency Medicine

## 2019-12-14 DIAGNOSIS — M545 Low back pain: Secondary | ICD-10-CM | POA: Insufficient documentation

## 2019-12-14 DIAGNOSIS — Z5321 Procedure and treatment not carried out due to patient leaving prior to being seen by health care provider: Secondary | ICD-10-CM | POA: Insufficient documentation

## 2019-12-14 DIAGNOSIS — R102 Pelvic and perineal pain: Secondary | ICD-10-CM | POA: Insufficient documentation

## 2019-12-14 LAB — COMPREHENSIVE METABOLIC PANEL
ALT: 20 U/L (ref 0–44)
AST: 19 U/L (ref 15–41)
Albumin: 3.9 g/dL (ref 3.5–5.0)
Alkaline Phosphatase: 48 U/L (ref 38–126)
Anion gap: 7 (ref 5–15)
BUN: 7 mg/dL (ref 6–20)
CO2: 22 mmol/L (ref 22–32)
Calcium: 10.5 mg/dL — ABNORMAL HIGH (ref 8.9–10.3)
Chloride: 110 mmol/L (ref 98–111)
Creatinine, Ser: 0.69 mg/dL (ref 0.44–1.00)
GFR calc Af Amer: >60 mL/min (ref >60)
GFR calc non Af Amer: >60 mL/min (ref >60)
Glucose, Bld: 111 mg/dL — ABNORMAL HIGH (ref 70–99)
Potassium: 3.6 mmol/L (ref 3.5–5.1)
Sodium: 139 mmol/L (ref 135–145)
Total Bilirubin: 0.7 mg/dL (ref 0.3–1.2)
Total Protein: 7 g/dL (ref 6.5–8.1)

## 2019-12-14 LAB — CBC
HCT: 40.3 % (ref 36.0–46.0)
Hemoglobin: 13.3 g/dL (ref 12.0–15.0)
MCH: 32.1 pg (ref 26.0–34.0)
MCHC: 33 g/dL (ref 30.0–36.0)
MCV: 97.3 fL (ref 80.0–100.0)
Platelets: 240 10*3/uL (ref 150–400)
RBC: 4.14 MIL/uL (ref 3.87–5.11)
RDW: 13.1 % (ref 11.5–15.5)
WBC: 4.7 10*3/uL (ref 4.0–10.5)
nRBC: 0 % (ref 0.0–0.2)

## 2019-12-14 LAB — URINALYSIS, ROUTINE W REFLEX MICROSCOPIC
Bacteria, UA: NONE SEEN
Bilirubin Urine: NEGATIVE
Glucose, UA: NEGATIVE mg/dL
Ketones, ur: NEGATIVE mg/dL
Leukocytes,Ua: NEGATIVE
Nitrite: NEGATIVE
Protein, ur: NEGATIVE mg/dL
Specific Gravity, Urine: 1.018 (ref 1.005–1.030)
pH: 5 (ref 5.0–8.0)

## 2019-12-14 LAB — LIPASE, BLOOD: Lipase: 23 U/L (ref 11–51)

## 2019-12-14 LAB — I-STAT BETA HCG BLOOD, ED (MC, WL, AP ONLY): I-stat hCG, quantitative: <5 m[IU]/mL (ref <5)

## 2019-12-14 MED ORDER — SODIUM CHLORIDE 0.9% FLUSH: 3.0000 mL | Freq: Once | INTRAVENOUS | Status: DC

## 2019-12-14 NOTE — ED Notes (Signed)
Pt states she is just going to go home and return in the morning. Tech informed her its not recommended and to be aware she just starts over in the morning

## 2019-12-14 NOTE — ED Triage Notes (Signed)
C/O low back pain and some swelling; stated was seen at [facility] and was instructed to come and be seen for further eval. Pt c/o lower abdominal pain as well. Denies problem w/ urination

## 2020-01-26 ENCOUNTER — Encounter (HOSPITAL_COMMUNITY): Payer: Self-pay

## 2020-01-26 ENCOUNTER — Other Ambulatory Visit: Payer: Self-pay

## 2020-01-26 ENCOUNTER — Ambulatory Visit (HOSPITAL_COMMUNITY)
Admission: EM | Admit: 2020-01-26 | Discharge: 2020-01-26 | Disposition: A | Discharge status: Home / Self Care | Payer: Self-pay | Attending: Physician Assistant | Admitting: Physician Assistant

## 2020-01-26 DIAGNOSIS — Z3202 Encounter for pregnancy test, result negative: Secondary | ICD-10-CM

## 2020-01-26 DIAGNOSIS — L732 Hidradenitis suppurativa: Secondary | ICD-10-CM

## 2020-01-26 LAB — POC URINE PREG, ED: Preg Test, Ur: NEGATIVE

## 2020-01-26 MED ORDER — DOXYCYCLINE HYCLATE 100 MG PO CAPS: 100.0000 mg | ORAL_CAPSULE | Freq: Two times a day (BID) | ORAL | 0 refills | Status: AC

## 2020-01-26 NOTE — ED Triage Notes (Signed)
Pt present abscess underneath her left arm pit, pt states this has been going on for over 3 yrs. The abscess will drain and close up but reopen  With more drain.

## 2020-01-26 NOTE — Discharge Instructions (Signed)
Take the medication as prescribed  Establish with a primary care  Return if not improving over the next 3-4 days

## 2020-01-26 NOTE — ED Provider Notes (Signed)
[facility]    CSN: [id number] Arrival date & time: 01/26/20  1759      History   Chief Complaint Chief Complaint  Patient presents with  . Abscess    left arm     HPI Jenna Henry is a 24 y.o. female.   Patient presents for evaluation of possible left axillary abscess.  She reports she has had issues with this on and off over the years.  She reports it will swell and then drain.  She reports he had similar issues in the right axilla.  She reports this most recent abscess was swollen for about a week and has been draining persistently.  She reports purulent discharge.  She reports this relieved most of the pain.  She has not had fevers or chills.     Past Medical History:  Diagnosis Date  . Anxiety   . Asthma   . Depression   . Herpes   . Polycystic ovaries     Patient Active Problem List   Diagnosis Date Noted  . Bipolar disorder (HCC) 10/11/2018  . MDD (major depressive disorder) 10/10/2018  . Intentional drug overdose (HCC) 10/09/2018  . Hypokalemia 10/09/2018  . 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 Palpitations 11/09/2017  . Chest pain 11/09/2017  . Cigarette smoker 11/09/2017  . Severe major depression without psychotic features (HCC) 02/28/2017  . 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 Suicide attempt (HCC) 02/28/2017  . Anxiety state 02/28/2017  . Insomnia 02/28/2017  . Herpes 11/12/2016  . Irregular menstruation 07/06/2016    Past Surgical History:  Procedure Laterality Date  . CYSTECTOMY      OB History    Gravida  0   Para  0   Term  0   Preterm  0   AB  0   Living  0     SAB  0   TAB  0   Ectopic  0   Multiple  0   Live Births  0            Home Medications    Prior to Admission medications   Medication Sig Start Date End Date Taking? Authorizing Provider  acetaminophen (TYLENOL) 500 MG tablet Take 1 tablet (500 mg total) by mouth every 6 (six) hours as  needed. 12/11/19   Devarion Mcclanahan, Veryl Speak, PA-C  albuterol (VENTOLIN HFA) 108 (90 Base) MCG/ACT inhaler Inhale 1-2 puffs into the lungs every 6 (six) hours as needed for wheezing or shortness of breath. 09/19/19   Wallis Bamberg, PA-C  doxycycline (VIBRAMYCIN) 100 MG capsule Take 1 capsule (100 mg total) by mouth 2 (two) times daily for 7 days. 01/26/20 02/02/20  Aysiah Jurado, Veryl Speak, PA-C  naproxen (NAPROSYN) 500 MG tablet Take 1 tablet (500 mg total) by mouth 2 (two) times daily. 12/11/19   Valari Taylor, Veryl Speak, PA-C  tiZANidine (ZANAFLEX) 4 MG tablet Take 1 tablet (4 mg total) by mouth every 8 (eight) hours as needed. 09/19/19   Wallis Bamberg, PA-C  venlafaxine XR (EFFEXOR-XR) 75 MG 24 hr capsule Take 1 capsule (75 mg total) by mouth daily with breakfast. Patient not taking: Reported on 02/22/2019 10/14/18 04/12/19  Money, Gerlene Burdock, FNP    Family History Family History  Problem Relation Age of Onset  . Hypertension Mother   . Anxiety disorder Mother   . Heart murmur Father   . Heart disease Maternal Grandfather   . Diabetes Paternal Grandmother   . Cancer Paternal Grandmother  Social History Social History   Tobacco Use  . Smoking status: Current Some Day Smoker    Packs/day: 0.50  . Smokeless tobacco: Never Used  . Tobacco comment: 4 cigarettes per day  Vaping Use  . Vaping Use: Never used  Substance Use Topics  . Alcohol use: Yes    Comment: occassional  . Drug use: Yes    Types: Marijuana     Allergies   Ortho tri-cyclen [norgestimate-eth estradiol]   Review of Systems Review of Systems   Physical Exam Triage Vital Signs ED Triage Vitals  Enc Vitals Group     BP 01/26/20 1942 111/62     Pulse Rate 01/26/20 1942 71     Resp 01/26/20 1942 16     Temp 01/26/20 1942 98.7 F (37.1 C)     Temp Source 01/26/20 1942 Oral     SpO2 01/26/20 1942 100 %     Weight --      Height --      Head Circumference --      Peak Flow --      Pain Score 01/26/20 1943 6     Pain Loc --      Pain Edu? --        Excl. in GC? --    No data found.  Updated Vital Signs BP 111/62 (BP Location: Right Arm)   Pulse 71   Temp 98.7 F (37.1 C) (Oral)   Resp 16   SpO2 100%   Visual Acuity Right Eye Distance:   Left Eye Distance:   Bilateral Distance:    Right Eye Near:   Left Eye Near:    Bilateral Near:     Physical Exam Vitals and nursing note reviewed.  Constitutional:      Appearance: Normal appearance.  Skin:    General: Skin is warm and dry.     Comments: Left axilla with open draining circular opening. Likely previous abscess.  No area of fluctuance or induration.  Tender to palpation however.  Neurological:     General: No focal deficit present.     Mental Status: She is alert and oriented to person, place, and time.      UC Treatments / Results  Labs (all labs ordered are listed, but only abnormal results are displayed) Labs Reviewed  POC URINE PREG, ED    EKG   Radiology No results found.  Procedures Procedures (including critical care time)  Medications Ordered in UC Medications - No data to display  Initial Impression / Assessment and Plan / UC Course  I have reviewed the triage vital signs and the nursing notes.  Pertinent labs & imaging results that were available during my care of the patient were reviewed by me and considered in my medical decision making (see chart for details).     #hidradenitis Patient is a 24 year old presenting with what appears to be hidradenitis disease of the axillas.  No drainable abscess today, appears to be nearly resolved.  Will place on doxycycline.  Will encourage her to establish with a primary care.  Return and follow precautions discussed.  Patient verbalized understanding plan of care Final Clinical Impressions(s) / UC Diagnoses   Final diagnoses:  Hidradenitis     Discharge Instructions     Take the medication as prescribed  Establish with a primary care  Return if not improving over the next 3-4  days    ED Prescriptions    Medication Sig Dispense Auth. Provider  doxycycline (VIBRAMYCIN) 100 MG capsule Take 1 capsule (100 mg total) by mouth 2 (two) times daily for 7 days. 14 capsule Laymond Postle, Veryl Speak, PA-C     PDMP not reviewed this encounter.   Hermelinda Medicus, PA-C 01/26/20 2115

## 2020-03-28 ENCOUNTER — Encounter (HOSPITAL_COMMUNITY): Payer: Self-pay | Admitting: Emergency Medicine

## 2020-03-28 ENCOUNTER — Other Ambulatory Visit: Payer: Self-pay

## 2020-03-28 ENCOUNTER — Ambulatory Visit (HOSPITAL_COMMUNITY)
Admission: EM | Admit: 2020-03-28 | Discharge: 2020-03-28 | Disposition: A | Discharge status: Home / Self Care | Payer: HRSA Program | Attending: Family Medicine | Admitting: Family Medicine

## 2020-03-28 DIAGNOSIS — F17210 Nicotine dependence, cigarettes, uncomplicated: Secondary | ICD-10-CM | POA: Diagnosis not present

## 2020-03-28 DIAGNOSIS — J069 Acute upper respiratory infection, unspecified: Secondary | ICD-10-CM | POA: Insufficient documentation

## 2020-03-28 DIAGNOSIS — Z20822 Contact with and (suspected) exposure to covid-19: Secondary | ICD-10-CM | POA: Insufficient documentation

## 2020-03-28 NOTE — Discharge Instructions (Addendum)
You have been tested for COVID-19 today. °If your test returns positive, you will receive a phone call from [facility] regarding your results. °Negative test results are not called. °Both positive and negative results area always visible on MyChart. °If you do not have a MyChart account, sign up instructions are provided in your discharge papers. °Please do not hesitate to contact us should you have questions or concerns. ° °

## 2020-03-28 NOTE — ED Triage Notes (Signed)
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  Patient saw her Saturday.  Started having a stuffy nose and sneezing on Saturday.  Thursday, everything hit her all at once: hot and cold spells, headache, body aches, lungs hurt with breathing and stuffy nose

## 2020-03-28 NOTE — ED Notes (Signed)
covid swab at bedside 

## 2020-03-29 LAB — SARS CORONAVIRUS 2 (TAT 6-24 HRS): SARS Coronavirus 2: NEGATIVE

## 2020-03-31 ENCOUNTER — Other Ambulatory Visit: Payer: Self-pay

## 2020-03-31 ENCOUNTER — Encounter (HOSPITAL_COMMUNITY): Payer: Self-pay | Admitting: Emergency Medicine

## 2020-03-31 ENCOUNTER — Ambulatory Visit (HOSPITAL_COMMUNITY)
Admission: EM | Admit: 2020-03-31 | Discharge: 2020-03-31 | Disposition: A | Discharge status: Home / Self Care | Payer: Medicaid Other | Attending: Family Medicine | Admitting: Family Medicine

## 2020-03-31 DIAGNOSIS — B349 Viral infection, unspecified: Secondary | ICD-10-CM

## 2020-03-31 DIAGNOSIS — F17210 Nicotine dependence, cigarettes, uncomplicated: Secondary | ICD-10-CM | POA: Insufficient documentation

## 2020-03-31 DIAGNOSIS — R0981 Nasal congestion: Secondary | ICD-10-CM | POA: Insufficient documentation

## 2020-03-31 DIAGNOSIS — Z20822 Contact with and (suspected) exposure to covid-19: Secondary | ICD-10-CM | POA: Insufficient documentation

## 2020-03-31 DIAGNOSIS — J4541 Moderate persistent asthma with (acute) exacerbation: Secondary | ICD-10-CM

## 2020-03-31 DIAGNOSIS — R5383 Other fatigue: Secondary | ICD-10-CM | POA: Insufficient documentation

## 2020-03-31 LAB — RESP PANEL BY RT PCR (RSV, FLU A&B, COVID)
Influenza A by PCR: NEGATIVE
Influenza B by PCR: NEGATIVE
Respiratory Syncytial Virus by PCR: NEGATIVE
SARS Coronavirus 2 by RT PCR: NEGATIVE

## 2020-03-31 MED ORDER — MONTELUKAST SODIUM 10 MG PO TABS: 10.0000 mg | ORAL_TABLET | Freq: Every day | ORAL | 2 refills | Status: DC

## 2020-03-31 MED ORDER — PREDNISONE 10 MG (21) PO TBPK: ORAL_TABLET | Freq: Every day | ORAL | 0 refills | Status: AC

## 2020-03-31 MED ORDER — ALBUTEROL SULFATE HFA 108 (90 BASE) MCG/ACT IN AERS: 2.0000 | INHALATION_SPRAY | RESPIRATORY_TRACT | 0 refills | Status: DC | PRN

## 2020-03-31 NOTE — ED Provider Notes (Signed)
[facility]   [id number] 03/31/20 Arrival Time: 1004   CC: COVID symptoms  SUBJECTIVE: History from: patient.  Jatoya Armbrister is a 24 y.o. female who presents with abrupt onset of nasal congestion, fatigue, PND, and persistent dry cough for 5 days. Denies sick exposure to COVID, flu or strep. Denies recent travel. Has negative history of Covid. Has not completed Covid vaccines. Has been taking Theraflu with little relief. Has hx asthma. Symptoms are worsened with activity. Denies previous symptoms in the past. Denies fever, chills, sinus pain, rhinorrhea, sore throat, nausea, changes in bowel or bladder habits. Had negative Covid test in this office x 3 days ago. Did not test for flu.  ROS: As per HPI.  All other pertinent ROS negative.     Past Medical History:  Diagnosis Date  . Anxiety   . Asthma   . Depression   . Herpes   . Polycystic ovaries    Past Surgical History:  Procedure Laterality Date  . CYSTECTOMY     Allergies  Allergen Reactions  . Ortho Tri-Cyclen [Norgestimate-Eth Estradiol] Hives, Itching, Rash and Other (See Comments)    "Burning all over"   No current facility-administered medications on file prior to encounter.   Current Outpatient Medications on File Prior to Encounter  Medication Sig Dispense Refill  . guaiFENesin (ROBITUSSIN) 100 MG/5ML SOLN Take 5 mLs by mouth every 4 (four) hours as needed for cough or to loosen phlegm.    . [DISCONTINUED] venlafaxine XR (EFFEXOR-XR) 75 MG 24 hr capsule Take 1 capsule (75 mg total) by mouth daily with breakfast. (Patient not taking: Reported on 02/22/2019) 30 capsule 0   Social History   Socioeconomic History  . Marital status: Significant Other    Spouse name: Not on file  . Number of children: Not on file  . Years of education: Not on file  . Highest education level: Not on file  Occupational History  . Not on file  Tobacco Use  . Smoking status: Current Some Day Smoker    Packs/day: 0.50  .  Smokeless tobacco: Never Used  . Tobacco comment: 4 cigarettes per day  Vaping Use  . Vaping Use: Never used  Substance and Sexual Activity  . Alcohol use: Yes    Comment: occassional  . Drug use: Yes    Types: Marijuana  . Sexual activity: Yes    Birth control/protection: None  Other Topics Concern  . Not on file  Social History Narrative  . Not on file   Social Determinants of Health   Financial Resource Strain:   . Difficulty of Paying Living Expenses: Not on file  Food Insecurity:   . Worried About Programme researcher, broadcasting/film/video in the Last Year: Not on file  . Ran Out of Food in the Last Year: Not on file  Transportation Needs:   . Lack of Transportation (Medical): Not on file  . Lack of Transportation (Non-Medical): Not on file  Physical Activity:   . Days of Exercise per Week: Not on file  . Minutes of Exercise per Session: Not on file  Stress:   . Feeling of Stress : Not on file  Social Connections:   . Frequency of Communication with Friends and Family: Not on file  . Frequency of Social Gatherings with Friends and Family: Not on file  . Attends Religious Services: Not on file  . Active Member of Clubs or Organizations: Not on file  . Attends Banker Meetings: Not on file  .  Marital Status: Not on file  Intimate Partner Violence:   . Fear of Current or Ex-Partner: Not on file  . Emotionally Abused: Not on file  . Physically Abused: Not on file  . Sexually Abused: Not on file   Family History  Problem Relation Age of Onset  . Hypertension Mother   . Anxiety disorder Mother   . Heart murmur Father   . Heart disease Maternal Grandfather   . Diabetes Paternal Grandmother   . Cancer Paternal Grandmother     OBJECTIVE:  Vitals:   03/31/20 1031  BP: (!) 100/53  Pulse: 98  Resp: 17  Temp: 98.7 F (37.1 C)  TempSrc: Oral  SpO2: 100%     General appearance: alert; appears fatigued, but nontoxic; speaking in full sentences and tolerating own  secretions HEENT: NCAT; Ears: EACs clear, TMs pearly gray; Eyes: PERRL.  EOM grossly intact. Sinuses: nontender; Nose: nares patent without rhinorrhea, Throat: oropharynx erythematous, cobblestoning present, tonsils non erythematous or enlarged, uvula midline  Neck: supple with LAD Lungs: unlabored respirations, symmetrical air entry; cough: mild; no respiratory distress; wheezing throughout bilateral lung fields Heart: regular rate and rhythm.  Radial pulses 2+ symmetrical bilaterally Skin: warm and dry Psychological: alert and cooperative; normal mood and affect  LABS:  No results found for this or any previous visit (from the past 24 hour(s)).   ASSESSMENT & PLAN:  1. Viral illness   2. Moderate persistent asthma with acute exacerbation     Meds ordered this encounter  Medications  . montelukast (SINGULAIR) 10 MG tablet    Sig: Take 1 tablet (10 mg total) by mouth at bedtime.    Dispense:  30 tablet    Refill:  2    Order Specific Question:   Supervising Provider    Answer:   Merrilee Jansky [id number]  . albuterol (VENTOLIN HFA) 108 (90 Base) MCG/ACT inhaler    Sig: Inhale 2 puffs into the lungs every 4 (four) hours as needed for wheezing or shortness of breath.    Dispense:  18 g    Refill:  0    Order Specific Question:   Supervising Provider    Answer:   Merrilee Jansky [id number]  . predniSONE (STERAPRED UNI-PAK 21 TAB) 10 MG (21) TBPK tablet    Sig: Take by mouth daily for 6 days. Take 6 tablets on day 1, 5 tablets on day 2, 4 tablets on day 3, 3 tablets on day 4, 2 tablets on day 5, 1 tablet on day 6    Dispense:  21 tablet    Refill:  0    Order Specific Question:   Supervising Provider    Answer:   Merrilee Jansky [[id number]]    Will treat for asthma exacerbation Prescribed singulair Prescribed albuterol inhaler Prescribed steroid taper COVID/FLU/RSV testing ordered.  It will take between 1-2 days for test results.  Someone will contact you regarding abnormal  results.    Patient should remain in quarantine until they have received Covid results.  If negative you may resume normal activities (go back to work/school) while practicing hand hygiene, social distance, and mask wearing.  If positive, patient should remain in quarantine for 10 days from symptom onset AND greater than 72 hours after symptoms resolution (absence of fever without the use of fever-reducing medication and improvement in respiratory symptoms), whichever is longer Get plenty of rest and push fluids Use OTC zyrtec for nasal congestion, runny nose, and/or sore throat Use OTC flonase  for nasal congestion and runny nose Use medications daily for symptom relief Use OTC medications like ibuprofen or tylenol as needed fever or pain Call or go to the ED if you have any new or worsening symptoms such as fever, worsening cough, shortness of breath, chest tightness, chest pain, turning blue, changes in mental status.  Reviewed expectations re: course of current medical issues. Questions answered. Outlined signs and symptoms indicating need for more acute intervention. Patient verbalized understanding. After Visit Summary given.         Moshe Cipro, NP 03/31/20 1306

## 2020-03-31 NOTE — ED Triage Notes (Signed)
Pt presents for follow-up after been seen 3 days ago. States still having headaches, chest discomfort when moving certain ways. States have chills and sweats, and nausea.

## 2020-03-31 NOTE — Discharge Instructions (Signed)
I have sent in a prednisone taper for you to take for 6 days. 6 tablets on day one, 5 tablets on day two, 4 tablets on day three, 3 tablets on day four, 2 tablets on day five, and 1 tablet on day six.  I have sent in albuterol for you to use 2 puffs every 4-6 hours as needed for cough, wheezing, shortness of breath  I have sent in singulair for you to take daily   Follow up with this office or with primary care if symptoms are persisting.  Follow up in the ER for high fever, trouble swallowing, trouble breathing, other concerning symptoms.

## 2020-04-02 NOTE — ED Provider Notes (Signed)
[facility]   [id number] 03/28/20 Arrival Time: 1552  ASSESSMENT & PLAN:  1. Viral URI      COVID-19 testing sent. See letter/work note on file for self-isolation guidelines. OTC symptom care as needed.    Follow-up Information    [facility].   Specialty: Urgent Care Why: As needed. Contact information: [address] [place] [address] [phone]              Reviewed expectations re: course of current medical issues. Questions answered. Outlined signs and symptoms indicating need for more acute intervention. Understanding verbalized. After Visit Summary given.   SUBJECTIVE: History from: patient. Jenna Henry is a 24 y.o. female who presents with worries regarding COVID-19. Known COVID-19 contact: question work contact. Recent travel: none. Reports: stuffy nose and sneezing; few days. Denies: fever, cough and headache. Normal PO intake without n/v/d.    OBJECTIVE:  Vitals:   03/28/20 1732  BP: 113/63  Pulse: 65  Resp: 20  Temp: 99.8 F (37.7 C)  TempSrc: Oral  SpO2: 100%    General appearance: alert; no distress Eyes: PERRLA; EOMI; conjunctiva normal HENT: [place]; AT; with nasal congestion Neck: supple  Lungs: speaks full sentences without difficulty; unlabored Extremities: no edema Skin: warm and dry Neurologic: normal gait Psychological: alert and cooperative; normal mood and affect  Labs:  Labs Reviewed  SARS CORONAVIRUS 2 (TAT 6-24 HRS)     Allergies  Allergen Reactions  . Ortho Tri-Cyclen [Norgestimate-Eth Estradiol] Hives, Itching, Rash and Other (See Comments)    "Burning all over"    Past Medical History:  Diagnosis Date  . Anxiety   . Asthma   . Depression   . Herpes   . Polycystic ovaries    Social History   Socioeconomic History  . Marital status: Significant Other    Spouse name: Not on file  . Number of children: Not on file  . Years of education: Not on file    . Highest education level: Not on file  Occupational History  . Not on file  Tobacco Use  . Smoking status: Current Some Day Smoker    Packs/day: 0.50  . Smokeless tobacco: Never Used  . Tobacco comment: 4 cigarettes per day  Vaping Use  . Vaping Use: Never used  Substance and Sexual Activity  . Alcohol use: Yes    Comment: occassional  . Drug use: Yes    Types: Marijuana  . Sexual activity: Yes    Birth control/protection: None  Other Topics Concern  . Not on file  Social History Narrative  . Not on file   Social Determinants of Health   Financial Resource Strain:   . Difficulty of Paying Living Expenses: Not on file  Food Insecurity:   . Worried About Programme researcher, broadcasting/film/video in the Last Year: Not on file  . Ran Out of Food in the Last Year: Not on file  Transportation Needs:   . Lack of Transportation (Medical): Not on file  . Lack of Transportation (Non-Medical): Not on file  Physical Activity:   . Days of Exercise per Week: Not on file  . Minutes of Exercise per Session: Not on file  Stress:   . Feeling of Stress : Not on file  Social Connections:   . Frequency of Communication with Friends and Family: Not on file  . Frequency of Social Gatherings with Friends and Family: Not on file  . Attends Religious Services: Not on  file  . Active Member of Clubs or Organizations: Not on file  . Attends Banker Meetings: Not on file  . Marital Status: Not on file  Intimate Partner Violence:   . Fear of Current or Ex-Partner: Not on file  . Emotionally Abused: Not on file  . Physically Abused: Not on file  . Sexually Abused: Not on file   Family History  Problem Relation Age of Onset  . Hypertension Mother   . Anxiety disorder Mother   . Heart murmur Father   . Heart disease Maternal Grandfather   . Diabetes Paternal Grandmother   . Cancer Paternal Grandmother    Past Surgical History:  Procedure Laterality Date  . Debbra Riding,  MD 04/02/20 1110

## 2020-05-31 ENCOUNTER — Ambulatory Visit (HOSPITAL_COMMUNITY)
Admission: EM | Admit: 2020-05-31 | Discharge: 2020-05-31 | Disposition: A | Discharge status: Home / Self Care | Payer: Medicaid Other | Attending: Emergency Medicine | Admitting: Emergency Medicine

## 2020-05-31 ENCOUNTER — Encounter (HOSPITAL_COMMUNITY): Payer: Self-pay | Admitting: Emergency Medicine

## 2020-05-31 DIAGNOSIS — S0990XA Unspecified injury of head, initial encounter: Secondary | ICD-10-CM

## 2020-05-31 DIAGNOSIS — S060X0A Concussion without loss of consciousness, initial encounter: Secondary | ICD-10-CM

## 2020-05-31 MED ORDER — IBUPROFEN 600 MG PO TABS: 600.0000 mg | ORAL_TABLET | Freq: Four times a day (QID) | ORAL | 0 refills | Status: DC | PRN
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
## 2020-05-31 NOTE — Discharge Instructions (Addendum)
Cognitive rest for the next several days.  May take ibuprofen 600 mg combined with 1000 mg of Tylenol 3-4 times a day as needed for headache.  You may do this starting today and continue for the next few days.  Do not take it for more than 5 to 7 days.  Ice to your right forehead/temple.  Follow-up with Dr. [facility] soon as you can.

## 2020-05-31 NOTE — ED Triage Notes (Signed)
PT: reports head inj yesterday... sts when she awoke from a nap she got up and lost her balance after being dizzy... sts she hit her bedroom wall that is made out of sheetrock  Sx also include blurry vision and headache  Reports she lost her breath but did not loose conscious    TAKING MEDS: none  A&O x4... NAD... Ambulatory

## 2020-08-02 ENCOUNTER — Other Ambulatory Visit: Payer: Self-pay

## 2020-08-02 ENCOUNTER — Encounter (HOSPITAL_COMMUNITY): Payer: Self-pay

## 2020-08-02 ENCOUNTER — Ambulatory Visit (HOSPITAL_COMMUNITY)
Admission: EM | Admit: 2020-08-02 | Discharge: 2020-08-02 | Disposition: A | Discharge status: Home / Self Care | Payer: Medicaid Other | Attending: Medical Oncology | Admitting: Medical Oncology

## 2020-08-02 DIAGNOSIS — B009 Herpesviral infection, unspecified: Secondary | ICD-10-CM

## 2020-08-02 DIAGNOSIS — J4541 Moderate persistent asthma with (acute) exacerbation: Secondary | ICD-10-CM

## 2020-08-02 MED ORDER — VALACYCLOVIR HCL 1 G PO TABS: 1000.0000 mg | ORAL_TABLET | Freq: Two times a day (BID) | ORAL | 0 refills | Status: AC

## 2020-08-02 MED ORDER — ALBUTEROL SULFATE HFA 108 (90 BASE) MCG/ACT IN AERS: 2.0000 | INHALATION_SPRAY | RESPIRATORY_TRACT | 0 refills | Status: DC | PRN

## 2020-08-02 NOTE — ED Triage Notes (Signed)
Pt presents with herpes outbreak X 1 week. Pt states she is out of anti viral medication.

## 2020-08-02 NOTE — ED Provider Notes (Signed)
[facility]    CSN: [id number] Arrival date & time: 08/02/20  1837      History   Chief Complaint Chief Complaint  Patient presents with  . Vaginal Outbreak    HPI Jenna Henry is a 25 y.o. female.   HPI   Herpes Simplex: Pt has a history of herpes simplex. She states that she has had a current outbreak for the past week. She is out of antiviral medication. She asks for a refill of this medication as well as a refill of her albuterol inhaler for her asthma. Asthma is well controlled and she has not had any signs or symptoms of an flare however the spring season tends to worsen her asthma so she wants to be prepared.   Past Medical History:  Diagnosis Date  . Anxiety   . Asthma   . Depression   . Herpes   . Polycystic ovaries     Patient Active Problem List   Diagnosis Date Noted  . Bipolar disorder (HCC) 10/11/2018  . MDD (major depressive disorder) 10/10/2018  . Intentional drug overdose (HCC) 10/09/2018  . Hypokalemia 10/09/2018  . 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 Palpitations 11/09/2017  . Chest pain 11/09/2017  . Cigarette smoker 11/09/2017  . Severe major depression without psychotic features (HCC) 02/28/2017  . 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 Suicide attempt (HCC) 02/28/2017  . Anxiety state 02/28/2017  . Insomnia 02/28/2017  . Herpes 11/12/2016  . Irregular menstruation 07/06/2016    Past Surgical History:  Procedure Laterality Date  . CYSTECTOMY      OB History    Gravida  0   Para  0   Term  0   Preterm  0   AB  0   Living  0     SAB  0   IAB  0   Ectopic  0   Multiple  0   Live Births  0            Home Medications    Prior to Admission medications   Medication Sig Start Date End Date Taking? Authorizing Provider  valACYclovir (VALTREX) 1000 MG tablet Take 1 tablet (1,000 mg total) by mouth 2 (two) times daily for 5 days. Start within 1 day of  outbreak symptoms 08/02/20 08/07/20 Yes Clent Jacks M, PA-C  albuterol (VENTOLIN HFA) 108 (90 Base) MCG/ACT inhaler Inhale 2 puffs into the lungs every 4 (four) hours as needed for wheezing or shortness of breath. 08/02/20   Rushie Chestnut, PA-C  guaiFENesin (ROBITUSSIN) 100 MG/5ML SOLN Take 5 mLs by mouth every 4 (four) hours as needed for cough or to loosen phlegm.    [provider]  ibuprofen (ADVIL) 600 MG tablet Take 1 tablet (600 mg total) by mouth every 6 (six) hours as needed. 05/31/20   Domenick Gong, MD  montelukast (SINGULAIR) 10 MG tablet Take 1 tablet (10 mg total) by mouth at bedtime. 03/31/20   Moshe Cipro, NP  venlafaxine XR (EFFEXOR-XR) 75 MG 24 hr capsule Take 1 capsule (75 mg total) by mouth daily with breakfast. Patient not taking: Reported on 02/22/2019 10/14/18 04/12/19  Money, Gerlene Burdock, FNP    Family History Family History  Problem Relation Age of Onset  . Hypertension Mother   . Anxiety disorder Mother   . Heart murmur Father   . Heart disease Maternal Grandfather   . Diabetes Paternal Grandmother   . Cancer Paternal Grandmother  Social History Social History   Tobacco Use  . Smoking status: Current Some Day Smoker    Packs/day: 0.50  . Smokeless tobacco: Never Used  . Tobacco comment: 4 cigarettes per day  Vaping Use  . Vaping Use: Never used  Substance Use Topics  . Alcohol use: Yes    Comment: occassional  . Drug use: Yes    Types: Marijuana     Allergies   Ortho tri-cyclen [norgestimate-eth estradiol]   Review of Systems Review of Systems  As stated above in HPI Physical Exam Triage Vital Signs ED Triage Vitals  Enc Vitals Group     BP 08/02/20 1902 121/74     Pulse Rate 08/02/20 1902 74     Resp 08/02/20 1902 18     Temp 08/02/20 1902 98.7 F (37.1 C)     Temp Source 08/02/20 1902 Oral     SpO2 08/02/20 1902 98 %     Weight --      Height --      Head Circumference --      Peak Flow --      Pain Score  08/02/20 1901 5     Pain Loc --      Pain Edu? --      Excl. in GC? --    No data found.  Updated Vital Signs BP 121/74 (BP Location: Left Arm)   Pulse 74   Temp 98.7 F (37.1 C) (Oral)   Resp 18   LMP 07/06/2020   SpO2 98%   Physical Exam Vitals and nursing note reviewed.  Constitutional:      General: She is not in acute distress.    Appearance: Normal appearance. She is not ill-appearing, toxic-appearing or diaphoretic.  Cardiovascular:     Rate and Rhythm: Normal rate and regular rhythm.     Heart sounds: Normal heart sounds.  Pulmonary:     Effort: Pulmonary effort is normal.     Breath sounds: Normal breath sounds.  Genitourinary:    Comments: Pt defers examination Neurological:     Mental Status: She is alert.      UC Treatments / Results  Labs (all labs ordered are listed, but only abnormal results are displayed) Labs Reviewed - No data to display  EKG   Radiology No results found.  Procedures Procedures (including critical care time)  Medications Ordered in UC Medications - No data to display  Initial Impression / Assessment and Plan / UC Course  I have reviewed the triage vital signs and the nursing notes.  Pertinent labs & imaging results that were available during my care of the patient were reviewed by me and considered in my medical decision making (see chart for details).     New. Sending in a refill of both medications. We discussed that ideally her Valtrex medication needs to be initiated within 1 day of outbreak so this medication would not likely help current symptoms.  Final Clinical Impressions(s) / UC Diagnoses   Final diagnoses:  Herpes simplex   Discharge Instructions   None    ED Prescriptions    Medication Sig Dispense Auth. Provider   valACYclovir (VALTREX) 1000 MG tablet Take 1 tablet (1,000 mg total) by mouth 2 (two) times daily for 5 days. Start within 1 day of outbreak symptoms 30 tablet Clent Jacks M, [place]    albuterol (VENTOLIN HFA) 108 (90 Base) MCG/ACT inhaler Inhale 2 puffs into the lungs every 4 (four) hours as needed for wheezing or  shortness of breath. 18 g Kanika Bungert M, [place]     PDMP not reviewed this encounter.   Rushie Chestnut, [place] 08/02/20 1934

## 2020-09-03 ENCOUNTER — Other Ambulatory Visit: Payer: Self-pay

## 2020-09-03 ENCOUNTER — Ambulatory Visit (HOSPITAL_COMMUNITY)
Admission: EM | Admit: 2020-09-03 | Discharge: 2020-09-03 | Disposition: A | Discharge status: Home / Self Care | Payer: Self-pay | Attending: Emergency Medicine | Admitting: Emergency Medicine

## 2020-09-03 ENCOUNTER — Encounter (HOSPITAL_COMMUNITY): Payer: Self-pay

## 2020-09-03 DIAGNOSIS — H1032 Unspecified acute conjunctivitis, left eye: Secondary | ICD-10-CM

## 2020-09-03 MED ORDER — POLYMYXIN B-TRIMETHOPRIM 10000-0.1 UNIT/ML-% OP SOLN: 1.0000 [drp] | Freq: Four times a day (QID) | OPHTHALMIC | 0 refills | Status: AC

## 2020-09-03 NOTE — ED Provider Notes (Signed)
[facility]    CSN: [id number] Arrival date & time: 09/03/20  0854      History   Chief Complaint Chief Complaint  Patient presents with  . Eye Drainage    HPI Jenna Henry is a 25 y.o. female.   Patient presents with 3-day history of left eye redness, itching, crusting, drainage.  No known injury.  She denies acute eye pain or changes in vision.  She reports having difficulty opening her eye in the morning due to the crusting.  She describes the drainage as yellow-green.  She denies fever, chills, or other symptoms.  Her medical history includes asthma, depression, anxiety, bipolar disorder, intentional drug overdose, suicide attempt, insomnia.  The history is provided by the patient and medical records.    Past Medical History:  Diagnosis Date  . Anxiety   . Asthma   . Depression   . Herpes   . Polycystic ovaries     Patient Active Problem List   Diagnosis Date Noted  . Bipolar disorder (HCC) 10/11/2018  . MDD (major depressive disorder) 10/10/2018  . Intentional drug overdose (HCC) 10/09/2018  . Hypokalemia 10/09/2018  . 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 Palpitations 11/09/2017  . Chest pain 11/09/2017  . Cigarette smoker 11/09/2017  . Severe major depression without psychotic features (HCC) 02/28/2017  . 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 Suicide attempt (HCC) 02/28/2017  . Anxiety state 02/28/2017  . Insomnia 02/28/2017  . Herpes 11/12/2016  . Irregular menstruation 07/06/2016    Past Surgical History:  Procedure Laterality Date  . CYSTECTOMY      OB History    Gravida  0   Para  0   Term  0   Preterm  0   AB  0   Living  0     SAB  0   IAB  0   Ectopic  0   Multiple  0   Live Births  0            Home Medications    Prior to Admission medications   Medication Sig Start Date End Date Taking? Authorizing Provider  trimethoprim-polymyxin b (POLYTRIM)  ophthalmic solution Place 1 drop into both eyes 4 (four) times daily for 7 days. 09/03/20 09/10/20 Yes Mickie Bail, NP  albuterol (VENTOLIN HFA) 108 (90 Base) MCG/ACT inhaler Inhale 2 puffs into the lungs every 4 (four) hours as needed for wheezing or shortness of breath. 08/02/20   Rushie Chestnut, PA-C  guaiFENesin (ROBITUSSIN) 100 MG/5ML SOLN Take 5 mLs by mouth every 4 (four) hours as needed for cough or to loosen phlegm.    [provider]  ibuprofen (ADVIL) 600 MG tablet Take 1 tablet (600 mg total) by mouth every 6 (six) hours as needed. 05/31/20   Domenick Gong, MD  montelukast (SINGULAIR) 10 MG tablet Take 1 tablet (10 mg total) by mouth at bedtime. 03/31/20   Moshe Cipro, NP  venlafaxine XR (EFFEXOR-XR) 75 MG 24 hr capsule Take 1 capsule (75 mg total) by mouth daily with breakfast. Patient not taking: Reported on 02/22/2019 10/14/18 04/12/19  Money, Gerlene Burdock, FNP    Family History Family History  Problem Relation Age of Onset  . Hypertension Mother   . Anxiety disorder Mother   . Heart murmur Father   . Heart disease Maternal Grandfather   . Diabetes Paternal Grandmother   . Cancer Paternal Grandmother     Social History Social History  Tobacco Use  . Smoking status: Current Some Day Smoker    Packs/day: 0.50  . Smokeless tobacco: Never Used  . Tobacco comment: 4 cigarettes per day  Vaping Use  . Vaping Use: Never used  Substance Use Topics  . Alcohol use: Yes    Comment: occassional  . Drug use: Yes    Types: Marijuana     Allergies   Ortho tri-cyclen [norgestimate-eth estradiol]   Review of Systems Review of Systems  Constitutional: Negative for chills and fever.  HENT: Negative for ear pain and sore throat.   Eyes: Positive for discharge, redness and itching. Negative for pain and visual disturbance.  Respiratory: Negative for cough and shortness of breath.   Cardiovascular: Negative for chest pain and palpitations.  Gastrointestinal:  Negative for abdominal pain and vomiting.  Genitourinary: Negative for dysuria and hematuria.  Musculoskeletal: Negative for arthralgias and back pain.  Skin: Negative for color change and rash.  Neurological: Negative for seizures and syncope.  All other systems reviewed and are negative.    Physical Exam Triage Vital Signs ED Triage Vitals  Enc Vitals Group     BP      Pulse      Resp      Temp      Temp src      SpO2      Weight      Height      Head Circumference      Peak Flow      Pain Score      Pain Loc      Pain Edu?      Excl. in GC?    No data found.  Updated Vital Signs BP 107/77 (BP Location: Right Arm)   Pulse 73   Temp 98.3 F (36.8 C) (Oral)   Resp 18   LMP 08/11/2020 (Exact Date)   SpO2 98%   Visual Acuity Right Eye Distance:   Left Eye Distance:   Bilateral Distance:    Right Eye Near:   Left Eye Near:    Bilateral Near:     Physical Exam Vitals and nursing note reviewed.  Constitutional:      General: She is not in acute distress.    Appearance: She is well-developed.  HENT:     Head: Normocephalic and atraumatic.     Mouth/Throat:     Mouth: Mucous membranes are moist.  Eyes:     General: Lids are normal. Vision grossly intact.     Extraocular Movements: Extraocular movements intact.     Conjunctiva/sclera:     Left eye: Left conjunctiva is injected.     Pupils: Pupils are equal, round, and reactive to light.     Comments: Green-yellow crusting in lashes of left eye.  Cardiovascular:     Rate and Rhythm: Normal rate and regular rhythm.     Heart sounds: Normal heart sounds.  Pulmonary:     Effort: Pulmonary effort is normal. No respiratory distress.     Breath sounds: Normal breath sounds.  Abdominal:     Palpations: Abdomen is soft.     Tenderness: There is no abdominal tenderness.  Musculoskeletal:     Cervical back: Neck supple.  Skin:    General: Skin is warm and dry.  Neurological:     General: No focal deficit  present.     Mental Status: She is alert and oriented to person, place, and time.     Gait: Gait normal.  Psychiatric:  Mood and Affect: Mood normal.        Behavior: Behavior normal.      UC Treatments / Results  Labs (all labs ordered are listed, but only abnormal results are displayed) Labs Reviewed - No data to display  EKG   Radiology No results found.  Procedures Procedures (including critical care time)  Medications Ordered in UC Medications - No data to display  Initial Impression / Assessment and Plan / UC Course  I have reviewed the triage vital signs and the nursing notes.  Pertinent labs & imaging results that were available during my care of the patient were reviewed by me and considered in my medical decision making (see chart for details).   Acute bacterial conjunctivitis of left eye.  No known injury.  No eye pain or changes in vision.  Treating with Polytrim eyedrops.  Instructed patient to follow-up with her eye care provider for recheck in 1 to 2 days if her symptoms are not improving.  ED precautions discussed.  Patient agrees to plan of care.   Final Clinical Impressions(s) / UC Diagnoses   Final diagnoses:  Acute bacterial conjunctivitis of left eye     Discharge Instructions     Use the antibiotic eyedrops as prescribed.    Follow-up with your eye doctor for a recheck in 1 to 2 days if your symptoms are not improving.    Go to the emergency department if you have acute eye pain, changes in your vision, or other concerning symptoms.       ED Prescriptions    Medication Sig Dispense Auth. Provider   trimethoprim-polymyxin b (POLYTRIM) ophthalmic solution Place 1 drop into both eyes 4 (four) times daily for 7 days. 10 mL Mickie Bail, NP     PDMP not reviewed this encounter.   Mickie Bail, NP 09/03/20 [phone]

## 2020-09-03 NOTE — ED Triage Notes (Signed)
Pt presents with a itchy, swollen and irritated left eye X 3 days.  Pt states she wakes up with crust on her eye.

## 2020-09-03 NOTE — Discharge Instructions (Signed)
Use the antibiotic eyedrops as prescribed.    Follow-up with your eye doctor for a recheck in 1 to 2 days if your symptoms are not improving.    Go to the emergency department if you have acute eye pain, changes in your vision, or other concerning symptoms.    

## 2020-10-14 ENCOUNTER — Ambulatory Visit: Payer: Self-pay | Admitting: *Deleted

## 2020-10-14 NOTE — Telephone Encounter (Signed)
Patient calls in with chronic abdominal pain, lower left side that she sometimes feels in the mid back. Constant dull ache that sometimes worsens. Nausea intermittently very little vomiting. Occasional diarrhea after some meals. No difficulty voiding, LMP 09/12/20, and no unusual vaginal discharge, does not "think" she's pregnant. Only medication tried, ibuprofen-made her sleepy. Per chart she has been to the ED multiple times for this. Per chart she was diagnosed with PCOS 03/08/2019 but no follow-up on file. Encouraged to cut out greasy/fatty/spicy foods, increase water intake, no caffeine products, keep a journal of when and what you eat and try OTC Pepto or Mylanta and journal if it helps or not. Patient will seek treatment at the UC/ED if no improvement.Provided mobile address if needed. LOV 09/24/17 with [facility]. Mom requesting new patient appointment with her pcp-Jenna Meredeth Ide, NP. No appointments found. Routing to clinic to reach out with appointment if available to use TOC on 11/25/20, please Reason for Disposition . Abdominal pain is a chronic symptom (recurrent or ongoing AND present > 4 weeks)  Answer Assessment - Initial Assessment Questions 1. LOCATION: "Where does it hurt?"      Lower abdomen on the left 2. RADIATION: "Does the pain shoot anywhere else?" (e.g., chest, back)     Feels it in the lower back 3. ONSET: "When did the pain begin?" (e.g., minutes, hours or days ago)      2 weeks 4. SUDDEN: "Gradual or sudden onset?"     Constant ache 5. PATTERN "Does the pain come and go, or is it constant?"    - If constant: "Is it getting better, staying the same, or worsening?"      (Note: Constant means the pain never goes away completely; most serious pain is constant and it progresses)     - If intermittent: "How long does it last?" "Do you have pain now?"     (Note: Intermittent means the pain goes away completely between bouts)     9 6. SEVERITY: "How bad is the pain?"  (e.g., Scale 1-10;  mild, moderate, or severe)   - MILD (1-3): doesn't interfere with normal activities, abdomen soft and not tender to touch    - MODERATE (4-7): interferes with normal activities or awakens from sleep, abdomen tender to touch    - SEVERE (8-10): excruciating pain, doubled over, unable to do any normal activities      9 7. RECURRENT SYMPTOM: "Have you ever had this type of stomach pain before?" If Yes, ask: "When was the last time?" and "What happened that time?"      no 8. CAUSE: "What do you think is causing the stomach pain?"     no 9. RELIEVING/AGGRAVATING FACTORS: "What makes it better or worse?" (e.g., movement, antacids, bowel movement)     ibuprofen 10. OTHER SYMPTOMS: "Do you have any other symptoms?" (e.g., back pain, diarrhea, fever, urination pain, vomiting)      Lower back pain intermittent 11. PREGNANCY: "Is there any chance you are pregnant?" "When was your last menstrual period?"       No that she is aware of.  Protocols used: ABDOMINAL PAIN [facility]

## 2020-10-16 NOTE — Telephone Encounter (Signed)
Called patient, no answer, and no VM set up. Was calling to offer patient a new patient appointment with Zelda at her first available or advise patient she can be seen on the mobile medicine unit much sooner and then follow up with a visit with Zelda after being seen on mobile.   Mobile medicine unit calendar can be found at [email or web address]

## 2020-11-08 ENCOUNTER — Emergency Department (HOSPITAL_COMMUNITY)
Admission: EM | Admit: 2020-11-08 | Discharge: 2020-11-08 | Disposition: A | Discharge status: Home / Self Care | Payer: Self-pay | Attending: Emergency Medicine | Admitting: Emergency Medicine

## 2020-11-08 ENCOUNTER — Emergency Department (HOSPITAL_COMMUNITY): Payer: Self-pay

## 2020-11-08 ENCOUNTER — Other Ambulatory Visit: Payer: Self-pay

## 2020-11-08 DIAGNOSIS — R1030 Lower abdominal pain, unspecified: Secondary | ICD-10-CM | POA: Insufficient documentation

## 2020-11-08 DIAGNOSIS — J45909 Unspecified asthma, uncomplicated: Secondary | ICD-10-CM | POA: Insufficient documentation

## 2020-11-08 DIAGNOSIS — F17210 Nicotine dependence, cigarettes, uncomplicated: Secondary | ICD-10-CM | POA: Insufficient documentation

## 2020-11-08 DIAGNOSIS — R110 Nausea: Secondary | ICD-10-CM | POA: Insufficient documentation

## 2020-11-08 LAB — URINALYSIS, ROUTINE W REFLEX MICROSCOPIC
Bilirubin Urine: NEGATIVE
Glucose, UA: NEGATIVE mg/dL
Hgb urine dipstick: NEGATIVE
Ketones, ur: NEGATIVE mg/dL
Leukocytes,Ua: NEGATIVE
Nitrite: NEGATIVE
Protein, ur: NEGATIVE mg/dL
Specific Gravity, Urine: 1.014 (ref 1.005–1.030)
pH: 6 (ref 5.0–8.0)

## 2020-11-08 LAB — COMPREHENSIVE METABOLIC PANEL
ALT: 22 U/L (ref 0–44)
AST: 22 U/L (ref 15–41)
Albumin: 4 g/dL (ref 3.5–5.0)
Alkaline Phosphatase: 51 U/L (ref 38–126)
Anion gap: 8 (ref 5–15)
BUN: <5 mg/dL — ABNORMAL LOW (ref 6–20)
CO2: 22 mmol/L (ref 22–32)
Calcium: 10.5 mg/dL — ABNORMAL HIGH (ref 8.9–10.3)
Chloride: 108 mmol/L (ref 98–111)
Creatinine, Ser: 0.68 mg/dL (ref 0.44–1.00)
GFR, Estimated: >60 mL/min (ref >60)
Glucose, Bld: 94 mg/dL (ref 70–99)
Potassium: 3.8 mmol/L (ref 3.5–5.1)
Sodium: 138 mmol/L (ref 135–145)
Total Bilirubin: 0.4 mg/dL (ref 0.3–1.2)
Total Protein: 7.5 g/dL (ref 6.5–8.1)

## 2020-11-08 LAB — CBC
HCT: 44.7 % (ref 36.0–46.0)
Hemoglobin: 14.7 g/dL (ref 12.0–15.0)
MCH: 32 pg (ref 26.0–34.0)
MCHC: 32.9 g/dL (ref 30.0–36.0)
MCV: 97.4 fL (ref 80.0–100.0)
Platelets: 244 10*3/uL (ref 150–400)
RBC: 4.59 MIL/uL (ref 3.87–5.11)
RDW: 13.3 % (ref 11.5–15.5)
WBC: 6.7 10*3/uL (ref 4.0–10.5)
nRBC: 0 % (ref 0.0–0.2)

## 2020-11-08 LAB — LIPASE, BLOOD: Lipase: 28 U/L (ref 11–51)

## 2020-11-08 LAB — I-STAT BETA HCG BLOOD, ED (MC, WL, AP ONLY): I-stat hCG, quantitative: <5 m[IU]/mL (ref <5)

## 2020-11-08 MED ORDER — TRAMADOL HCL 50 MG PO TABS
50.0000 mg | ORAL_TABLET | Freq: Four times a day (QID) | ORAL | 0 refills | Status: DC | PRN
Start: 2020-11-08 — End: 2021-02-11

## 2020-11-08 MED ORDER — ONDANSETRON 4 MG PO TBDP
4.0000 mg | ORAL_TABLET | Freq: Once | ORAL | Status: DC
  Filled 2020-11-08: qty 1

## 2020-11-08 NOTE — ED Notes (Signed)
Walked patient to the bathroom patient did well 

## 2020-11-08 NOTE — ED Provider Notes (Signed)
[facility] EMERGENCY DEPARTMENT Provider Note   CSN: [id number] Arrival date & time: 11/08/20  1116     History Chief Complaint  Patient presents with   Abdominal Pain    Jenna Henry is a 25 y.o. female.  Patient with a complaint of bilateral lower quadrant abdominal pain for 2 weeks.  Associated with nausea no vomiting no diarrhea but has been having more frequent bowel movements 3-4 times a day.  No blood in them.  And urinary frequency.      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   Suicide attempt (HCC) 02/28/2017   Anxiety state 02/28/2017   Insomnia 02/28/2017   Herpes 11/12/2016   Irregular menstruation 07/06/2016    Past Surgical History:  Procedure Laterality Date   CYSTECTOMY       OB History     Gravida  0   Para  0   Term  0   Preterm  0   AB  0   Living  0      SAB  0   IAB  0   Ectopic  0   Multiple  0   Live Births  0           Family History  Problem Relation Age of Onset   Hypertension Mother    Anxiety disorder Mother    Heart murmur Father    Heart disease Maternal Grandfather    Diabetes Paternal Grandmother    Cancer Paternal Grandmother     Social History   Tobacco Use   Smoking status: Some Days    Packs/day: 0.50    Pack years: 0.00    Types: Cigarettes   Smokeless tobacco: Never   Tobacco comments:    4 cigarettes per day  Vaping Use    Vaping Use: Never used  Substance Use Topics   Alcohol use: Yes    Comment: occassional   Drug use: Yes    Types: Marijuana    Home Medications Prior to Admission medications   Medication Sig Start Date End Date Taking? Authorizing Provider  traMADol (ULTRAM) 50 MG tablet Take 1 tablet (50 mg total) by mouth every 6 (six) hours as needed. 11/08/20  Yes Vanetta Mulders, MD  albuterol (VENTOLIN HFA) 108 (90 Base) MCG/ACT inhaler Inhale 2 puffs into the lungs every 4 (four) hours as needed for wheezing or shortness of breath. 08/02/20   Rushie Chestnut, PA-C  guaiFENesin (ROBITUSSIN) 100 MG/5ML SOLN Take 5 mLs by mouth every 4 (four) hours as needed for cough or to loosen phlegm.    [provider]  ibuprofen (ADVIL) 600 MG tablet Take 1 tablet (600 mg total) by mouth every 6 (six) hours as needed. 05/31/20   Domenick Gong, MD  montelukast (SINGULAIR) 10 MG tablet Take 1 tablet (10 mg total) by mouth at bedtime. 03/31/20   Moshe Cipro, NP  venlafaxine XR (EFFEXOR-XR) 75 MG  24 hr capsule Take 1 capsule (75 mg total) by mouth daily with breakfast. Patient not taking: Reported on 02/22/2019 10/14/18 04/12/19  Money, Gerlene Burdock, FNP    Allergies    Ortho tri-cyclen [norgestimate-eth estradiol]  Review of Systems   Review of Systems  Constitutional:  Negative for chills and fever.  HENT:  Negative for ear pain and sore throat.   Eyes:  Negative for pain and visual disturbance.  Respiratory:  Negative for cough and shortness of breath.   Cardiovascular:  Negative for chest pain and palpitations.  Gastrointestinal:  Positive for abdominal pain and nausea. Negative for diarrhea and vomiting.  Genitourinary:  Positive for frequency. Negative for dysuria, hematuria and vaginal discharge.  Musculoskeletal:  Negative for arthralgias and back pain.  Skin:  Negative for color change and rash.  Neurological:  Negative for seizures and syncope.  All other systems reviewed and are  negative.  Physical Exam Updated Vital Signs BP 119/83   Pulse (!) 57   Temp 98.5 F (36.9 C) (Oral)   Resp 18   SpO2 100%   Physical Exam Vitals and nursing note reviewed.  Constitutional:      General: She is not in acute distress.    Appearance: She is well-developed.  HENT:     Head: Normocephalic and atraumatic.  Eyes:     Extraocular Movements: Extraocular movements intact.     Conjunctiva/sclera: Conjunctivae normal.     Pupils: Pupils are equal, round, and reactive to light.  Cardiovascular:     Rate and Rhythm: Normal rate and regular rhythm.     Heart sounds: No murmur heard. Pulmonary:     Effort: Pulmonary effort is normal. No respiratory distress.     Breath sounds: Normal breath sounds.  Abdominal:     General: There is no distension.     Palpations: Abdomen is soft.     Tenderness: There is no abdominal tenderness. There is no guarding.  Musculoskeletal:        General: Normal range of motion.     Cervical back: Normal range of motion and neck supple.  Skin:    General: Skin is warm and dry.     Capillary Refill: Capillary refill takes 2 to 3 seconds.  Neurological:     General: No focal deficit present.     Mental Status: She is alert and oriented to person, place, and time.     Cranial Nerves: No cranial nerve deficit.     Sensory: No sensory deficit.     Motor: No weakness.    ED Results / Procedures / Treatments   Labs (all labs ordered are listed, but only abnormal results are displayed) Labs Reviewed  COMPREHENSIVE METABOLIC PANEL - Abnormal; Notable for the following components:      Result Value   BUN <5 (*)    Calcium 10.5 (*)    All other components within normal limits  URINALYSIS, ROUTINE W REFLEX MICROSCOPIC - Abnormal; Notable for the following components:   APPearance HAZY (*)    All other components within normal limits  LIPASE, BLOOD  CBC  I-STAT BETA HCG BLOOD, ED ([facility], [facility], [facility] ONLY)    EKG None  Radiology CT Abdomen  Pelvis Wo Contrast  Result Date: 11/08/2020 CLINICAL DATA:  Lower abdominal pain EXAM: CT ABDOMEN AND PELVIS WITHOUT CONTRAST TECHNIQUE: Multidetector CT imaging of the abdomen and pelvis was performed following the standard protocol without IV contrast. COMPARISON:  12/03/2019 FINDINGS: Lower chest: Included lung bases  are clear.  Heart size is normal. Hepatobiliary: Unremarkable unenhanced appearance of the liver. No focal liver lesion identified. Gallbladder within normal limits. No hyperdense gallstone. No biliary dilatation. Pancreas: Unremarkable. No pancreatic ductal dilatation or surrounding inflammatory changes. Spleen: Normal in size without focal abnormality. Adrenals/Urinary Tract: Adrenal glands are unremarkable. Kidneys are normal, without renal calculi, focal lesion, or hydronephrosis. Bladder is unremarkable. Stomach/Bowel: Stomach is within normal limits. Appendix appears normal (series 3, image 50). No evidence of bowel wall thickening, distention, or inflammatory changes. Vascular/Lymphatic: No significant vascular findings are present. No enlarged abdominal or pelvic lymph nodes. Reproductive: Uterus and bilateral adnexa are unremarkable. Other: Small volume free fluid within the cul-de-sac, likely physiologic. No organized abdominopelvic fluid collections. No pneumoperitoneum. No abdominal wall hernia. Musculoskeletal: No acute or significant osseous findings. IMPRESSION: 1. No acute abdominopelvic findings. Normal appendix. 2. Small volume free fluid within the cul-de-sac, likely physiologic. Electronically Signed   By: Duanne Guess D.O.   On: 11/08/2020 14:42    Procedures Procedures   Medications Ordered in ED Medications  ondansetron (ZOFRAN-ODT) disintegrating tablet 4 mg (4 mg Oral Not Given 11/08/20 1400)    ED Course  I have reviewed the triage vital signs and the nursing notes.  Pertinent labs & imaging results that were available during my care of the patient were  reviewed by me and considered in my medical decision making (see chart for details).    MDM Rules/Calculators/A&P                          Clinically was suspicious may be for urinary tract infection.  The fact that the lower quadrant pain had been going on for 2 weeks made appendicitis unlikely.  CT scan shows no acute findings.  Labs without any significant abnormality.  Urine was hazy but otherwise negative.  Pregnancy test negative.  Renal function normal.  No leukocytosis no anemia.  Treat symptomatically and have patient follow-up with wellness clinic.  Patient will give prescription for some tramadol.  Patient states she does not need work note.   Final Clinical Impression(s) / ED Diagnoses Final diagnoses:  Lower abdominal pain    Rx / DC Orders ED Discharge Orders          Ordered    traMADol (ULTRAM) 50 MG tablet  Every 6 hours PRN        11/08/20 1611             Vanetta Mulders, MD 11/08/20 1615

## 2020-11-08 NOTE — ED Triage Notes (Signed)
Pt reports lower abdominal and nausea x 2 weeks. Denies diarrhea. Endorses dizziness when standing up.

## 2020-11-08 NOTE — ED Notes (Signed)
Patient is now back in bed with family at bedside

## 2020-11-08 NOTE — Discharge Instructions (Addendum)
Work-up for the lower abdominal pain without any acute findings.  Pregnancy test negative urinalysis negative CT scan without any abnormalities.  Take the tramadol as needed.  Follow-up with the wellness clinic.  Return for any new or worse symptoms.

## 2020-11-18 ENCOUNTER — Emergency Department (HOSPITAL_COMMUNITY)
Admission: EM | Admit: 2020-11-18 | Discharge: 2020-11-18 | Discharge status: Against Medical Advice | Payer: Medicaid Other

## 2020-11-19 ENCOUNTER — Emergency Department (HOSPITAL_BASED_OUTPATIENT_CLINIC_OR_DEPARTMENT_OTHER)
Admission: EM | Admit: 2020-11-19 | Discharge: 2020-11-19 | Disposition: A | Discharge status: Home / Self Care | Payer: Medicaid Other | Attending: Emergency Medicine | Admitting: Emergency Medicine

## 2020-11-19 ENCOUNTER — Other Ambulatory Visit: Payer: Self-pay

## 2020-11-19 ENCOUNTER — Encounter (HOSPITAL_BASED_OUTPATIENT_CLINIC_OR_DEPARTMENT_OTHER): Payer: Self-pay

## 2020-11-19 DIAGNOSIS — R1030 Lower abdominal pain, unspecified: Secondary | ICD-10-CM | POA: Insufficient documentation

## 2020-11-19 DIAGNOSIS — Z87891 Personal history of nicotine dependence: Secondary | ICD-10-CM | POA: Insufficient documentation

## 2020-11-19 DIAGNOSIS — J45909 Unspecified asthma, uncomplicated: Secondary | ICD-10-CM | POA: Insufficient documentation

## 2020-11-19 DIAGNOSIS — R102 Pelvic and perineal pain: Secondary | ICD-10-CM | POA: Insufficient documentation

## 2020-11-19 DIAGNOSIS — N938 Other specified abnormal uterine and vaginal bleeding: Secondary | ICD-10-CM

## 2020-11-19 LAB — URINALYSIS, ROUTINE W REFLEX MICROSCOPIC
Bilirubin Urine: NEGATIVE
Glucose, UA: NEGATIVE mg/dL
Ketones, ur: NEGATIVE mg/dL
Nitrite: NEGATIVE
Protein, ur: 30 mg/dL — AB
RBC / HPF: >50 RBC/hpf — ABNORMAL HIGH (ref 0–5)
Specific Gravity, Urine: 1.022 (ref 1.005–1.030)
WBC, UA: >50 WBC/hpf — ABNORMAL HIGH (ref 0–5)
pH: 6 (ref 5.0–8.0)

## 2020-11-19 LAB — WET PREP, GENITAL
Clue Cells Wet Prep HPF POC: NONE SEEN
Sperm: NONE SEEN
Trich, Wet Prep: NONE SEEN
Yeast Wet Prep HPF POC: NONE SEEN

## 2020-11-19 LAB — CBC
HCT: 42.2 % (ref 36.0–46.0)
Hemoglobin: 14 g/dL (ref 12.0–15.0)
MCH: 32 pg (ref 26.0–34.0)
MCHC: 33.2 g/dL (ref 30.0–36.0)
MCV: 96.6 fL (ref 80.0–100.0)
Platelets: 229 10*3/uL (ref 150–400)
RBC: 4.37 MIL/uL (ref 3.87–5.11)
RDW: 13.3 % (ref 11.5–15.5)
WBC: 8 10*3/uL (ref 4.0–10.5)
nRBC: 0 % (ref 0.0–0.2)

## 2020-11-19 LAB — PREGNANCY, URINE: Preg Test, Ur: NEGATIVE

## 2020-11-19 MED ORDER — KETOROLAC TROMETHAMINE 15 MG/ML IJ SOLN
15.0000 mg | Freq: Once | INTRAMUSCULAR | Status: AC
  Administered 2020-11-19: 15 mg via INTRAVENOUS
  Filled 2020-11-19: qty 1

## 2020-11-19 MED ORDER — KETOROLAC TROMETHAMINE 15 MG/ML IJ SOLN
15.0000 mg | Freq: Once | INTRAMUSCULAR | Status: DC
  Filled 2020-11-19: qty 1

## 2020-11-19 NOTE — ED Triage Notes (Signed)
"  Lower abdominal pain and vaginal bleeding x 1 month. Was seen at [facility] twice for same problem and they told me everything was fine" per pt

## 2020-11-19 NOTE — ED Provider Notes (Signed)
[facility] EMERGENCY DEPT Provider Note   CSN: [id number] Arrival date & time: 11/19/20  0913     History Chief Complaint  Patient presents with   Vaginal Bleeding    Started nearly a month ago, got more heavy recently    Jenna Henry is a 25 y.o. female.  25 yo F with a chief complaints of vaginal bleeding.  This started this morning.  She thinks it is a bit more than her normal menstrual cycle.  She is about a week late from starting her normal menses.  States she has been having some brownish discharge over the course of the past month and has had episodic lower abdominal pain with this.  Sharp and comes and goes throughout the day.  Nothing seems to make it better or worse.  Has had some nausea but denies vomiting.  Has had some increased stool output.  Had a couple days it was dark in the past week but that is resolved.  Not grossly bloody.  The history is provided by the patient.  Vaginal Bleeding Quality:  Bright red Severity:  Moderate Onset quality:  Sudden Duration:  1 day Timing:  Constant Progression:  Unchanged Chronicity:  New Possible pregnancy: no   Relieved by:  Nothing Worsened by:  Nothing Ineffective treatments:  None tried Associated symptoms: vaginal discharge   Associated symptoms: no dizziness, no dysuria, no fever and no nausea       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   Suicide  attempt (HCC) 02/28/2017   Anxiety state 02/28/2017   Insomnia 02/28/2017   Herpes 11/12/2016   Irregular menstruation 07/06/2016    Past Surgical History:  Procedure Laterality Date   CYSTECTOMY       OB History     Gravida  0   Para  0   Term  0   Preterm  0   AB  0   Living  0      SAB  0   IAB  0   Ectopic  0   Multiple  0   Live Births  0           Family History  Problem Relation Age of Onset   Hypertension Mother    Anxiety disorder Mother    Heart murmur Father    Heart disease Maternal Grandfather    Diabetes Paternal Grandmother    Cancer Paternal Grandmother     Social History   Tobacco Use   Smoking status: Former    Packs/day: 0.50    Pack years: 0.00    Types: Cigarettes   Smokeless tobacco: Never   Tobacco comments:    4 cigarettes per day  Vaping Use   Vaping Use: Every day   Substances: Nicotine  Substance Use Topics   Alcohol use: Yes    Alcohol/week: 2.0 standard drinks    Types: 2 Cans of beer per week  Comment: occassional   Drug use: Yes    Types: Marijuana    Home Medications Prior to Admission medications   Medication Sig Start Date End Date Taking? Authorizing Provider  albuterol (VENTOLIN HFA) 108 (90 Base) MCG/ACT inhaler Inhale 2 puffs into the lungs every 4 (four) hours as needed for wheezing or shortness of breath. 08/02/20   Rushie Chestnut, PA-C  guaiFENesin (ROBITUSSIN) 100 MG/5ML SOLN Take 5 mLs by mouth every 4 (four) hours as needed for cough or to loosen phlegm.    [provider]  ibuprofen (ADVIL) 600 MG tablet Take 1 tablet (600 mg total) by mouth every 6 (six) hours as needed. 05/31/20   Domenick Gong, MD  montelukast (SINGULAIR) 10 MG tablet Take 1 tablet (10 mg total) by mouth at bedtime. 03/31/20   Moshe Cipro, NP  traMADol (ULTRAM) 50 MG tablet Take 1 tablet (50 mg total) by mouth every 6 (six) hours as needed. 11/08/20   Vanetta Mulders, MD  venlafaxine XR  (EFFEXOR-XR) 75 MG 24 hr capsule Take 1 capsule (75 mg total) by mouth daily with breakfast. Patient not taking: Reported on 02/22/2019 10/14/18 04/12/19  Money, Gerlene Burdock, FNP    Allergies    Ortho tri-cyclen [norgestimate-eth estradiol]  Review of Systems   Review of Systems  Constitutional:  Negative for chills and fever.  HENT:  Negative for congestion and rhinorrhea.   Eyes:  Negative for redness and visual disturbance.  Respiratory:  Negative for shortness of breath and wheezing.   Cardiovascular:  Negative for chest pain and palpitations.  Gastrointestinal:  Negative for nausea and vomiting.  Genitourinary:  Positive for pelvic pain, vaginal bleeding and vaginal discharge. Negative for dysuria and urgency.  Musculoskeletal:  Negative for arthralgias and myalgias.  Skin:  Negative for pallor and wound.  Neurological:  Negative for dizziness and headaches.   Physical Exam Updated Vital Signs BP 113/66   Pulse (!) 51   Temp 98 F (36.7 C) (Oral)   Resp 14   Ht 5\' 5"  (1.651 m)   Wt 88.9 kg   LMP 11/19/2020   SpO2 100%   BMI 32.62 kg/m   Physical Exam Vitals and nursing note reviewed.  Constitutional:      General: She is not in acute distress.    Appearance: She is well-developed. She is not diaphoretic.  HENT:     Head: Normocephalic and atraumatic.  Eyes:     Pupils: Pupils are equal, round, and reactive to light.  Cardiovascular:     Rate and Rhythm: Normal rate and regular rhythm.     Heart sounds: No murmur heard.   No friction rub. No gallop.  Pulmonary:     Effort: Pulmonary effort is normal.     Breath sounds: No wheezing or rales.  Abdominal:     General: There is no distension.     Palpations: Abdomen is soft.     Tenderness: There is no abdominal tenderness.     Comments: Very mild lower abdominal pain worse to the suprapubic region.  Genitourinary:    Comments: Trace blood in the vault, trace blood at the cervix.  Mild tenderness diffusely about the  uterus.  No unilateral adnexal tenderness. Musculoskeletal:        General: No tenderness.     Cervical back: Normal range of motion and neck supple.  Skin:    General: Skin is warm and dry.  Neurological:     Mental Status: She is alert and oriented  to person, place, and time.  Psychiatric:        Behavior: Behavior normal.    ED Results / Procedures / Treatments   Labs (all labs ordered are listed, but only abnormal results are displayed) Labs Reviewed  WET PREP, GENITAL - Abnormal; Notable for the following components:      Result Value   WBC, Wet Prep HPF POC FEW (*)    All other components within normal limits  URINALYSIS, ROUTINE W REFLEX MICROSCOPIC - Abnormal; Notable for the following components:   Color, Urine BROWN (*)    APPearance CLOUDY (*)    Hgb urine dipstick LARGE (*)    Protein, ur 30 (*)    Leukocytes,Ua MODERATE (*)    RBC / HPF >50 (*)    WBC, UA >50 (*)    Bacteria, UA MANY (*)    Crystals PRESENT (*)    All other components within normal limits  PREGNANCY, URINE  CBC  GC/CHLAMYDIA PROBE AMP ([place]) NOT AT [facility]    EKG None  Radiology No results found.  Procedures Procedures   Medications Ordered in ED Medications  ketorolac (TORADOL) 15 MG/ML injection 15 mg (15 mg Intravenous Given 11/19/20 1120)    ED Course  I have reviewed the triage vital signs and the nursing notes.  Pertinent labs & imaging results that were available during my care of the patient were reviewed by me and considered in my medical decision making (see chart for details).    MDM Rules/Calculators/A&P                          25 yo F with a chief complaints of pelvic pain vaginal discharge and no vaginal bleeding.  She is well-appearing and nontoxic.  Will obtain a pregnancy test UA pelvic exam.  Patient is not pregnant.  We will give a dose of Toradol.  Wet prep without significant finding.  UA is likely contaminated and she has no urinary symptoms.  CBC  without anemia.  12:48 PM:  I have discussed the diagnosis/risks/treatment options with the patient and family and believe the pt to be eligible for discharge home to follow-up with GYN. We also discussed returning to the ED immediately if new or worsening sx occur. We discussed the sx which are most concerning (e.g., sudden worsening pain, fever, inability to tolerate by mouth, syncope, worsening bleeding) that necessitate immediate return. Medications administered to the patient during their visit and any new prescriptions provided to the patient are listed below.  Medications given during this visit Medications  ketorolac (TORADOL) 15 MG/ML injection 15 mg (15 mg Intravenous Given 11/19/20 1120)     The patient appears reasonably screen and/or stabilized for discharge and I doubt any other medical condition or other [facility] requiring further screening, evaluation, or treatment in the ED at this time prior to discharge.   Final Clinical Impression(s) / ED Diagnoses Final diagnoses:  DUB (dysfunctional uterine bleeding)    Rx / DC Orders ED Discharge Orders     None        Melene Plan, DO 11/19/20 1248

## 2020-11-19 NOTE — Discharge Instructions (Addendum)
Take 4 over the counter ibuprofen tablets 3 times a day or 2 over-the-counter naproxen tablets twice a day for pain. Also take tylenol 1000mg (2 extra strength) four times a day.   Your lab work shows that you have not lost any significant amount of blood.  Your pregnancy test was negative.  Please follow-up with your OB/GYN in the office.    Return for worsening pain, if you pass out.

## 2020-11-20 LAB — GC/CHLAMYDIA PROBE AMP (~~LOC~~) NOT AT ARMC
Chlamydia: NEGATIVE
Comment: NEGATIVE
Comment: NORMAL
Neisseria Gonorrhea: NEGATIVE

## 2020-12-04 ENCOUNTER — Other Ambulatory Visit: Payer: Self-pay

## 2020-12-04 ENCOUNTER — Ambulatory Visit (INDEPENDENT_AMBULATORY_CARE_PROVIDER_SITE_OTHER): Payer: Medicaid Other | Admitting: Certified Nurse Midwife

## 2020-12-04 VITALS — BP 106/80 | HR 93 | Ht 66.8 in | Wt 176.2 lb

## 2020-12-04 DIAGNOSIS — G8929 Other chronic pain: Secondary | ICD-10-CM

## 2020-12-04 DIAGNOSIS — M545 Low back pain, unspecified: Secondary | ICD-10-CM

## 2020-12-04 DIAGNOSIS — N926 Irregular menstruation, unspecified: Secondary | ICD-10-CM

## 2020-12-06 NOTE — Progress Notes (Signed)
History:  [name] is a 25 y.o. G0P0000 who presents to clinic today for evaluation of regular cycles with irregular bleeding and concern regarding chronic right-sided back/abdominal pain that is aggravated by movement. She has had this evaluated multiple times including a recent CT that have not shown any emergent issues.   Her periods have come every month but for two months came at the end of the month and then began coming at the beginning of the month. Some will be light spotting for a few days, sometimes she will have a day of heavy bleeding followed by two days of lighter bleeding, this past one was 24hrs of very heavy bleeding and then it got lighter lasting only 3 days total.  The following portions of the patient's history were reviewed and updated as appropriate: allergies, current medications, family history, past medical history, social history, past surgical history and problem list.  Review of Systems:  Pertinent items noted in HPI and remainder of comprehensive ROS otherwise negative.   Objective:  Physical Exam BP 106/80   Pulse 93   Ht 5' 6.8" (1.697 m)   Wt 176 lb 3.2 oz (79.9 kg)   LMP 11/19/2020   BMI 27.76 kg/m  Physical Exam Vitals and nursing note reviewed.  Constitutional:      Appearance: Normal appearance.  Eyes:     Pupils: Pupils are equal, round, and reactive to light.  Cardiovascular:     Rate and Rhythm: Normal rate.  Pulmonary:     Effort: Pulmonary effort is normal.  Abdominal:     General: There is no distension.     Palpations: Abdomen is soft. There is no mass.     Tenderness: There is no abdominal tenderness.  Musculoskeletal:        General: Normal range of motion.     Cervical back: Normal range of motion.  Skin:    Capillary Refill: Capillary refill takes less than 2 seconds.  Neurological:     Mental Status: She is alert and oriented to person, place, and time.  Psychiatric:        Mood and Affect: Mood normal.         Behavior: Behavior normal.        Thought Content: Thought content normal.        Judgment: Judgment normal.     Labs and Imaging CLINICAL DATA:  Lower abdominal pain EXAM (from 11/08/20): CT ABDOMEN AND PELVIS WITHOUT CONTRAST TECHNIQUE: Multidetector CT imaging of the abdomen and pelvis was performed following the standard protocol without IV contrast. COMPARISON:  12/03/2019 FINDINGS: Lower chest: Included lung bases are clear.  Heart size is normal. Hepatobiliary: Unremarkable unenhanced appearance of the liver. No focal liver lesion identified. Gallbladder within normal limits. No hyperdense gallstone. No biliary dilatation. Pancreas: Unremarkable. No pancreatic ductal dilatation or surrounding inflammatory changes. Spleen: Normal in size without focal abnormality. Adrenals/Urinary Tract: Adrenal glands are unremarkable. Kidneys are normal, without renal calculi, focal lesion, or hydronephrosis. Bladder is unremarkable. Stomach/Bowel: Stomach is within normal limits. Appendix appears normal (series 3, image 50). No evidence of bowel wall thickening, distention, or inflammatory changes. Vascular/Lymphatic: No significant vascular findings are present. No enlarged abdominal or pelvic lymph nodes. Reproductive: Uterus and bilateral adnexa are unremarkable. Other: Small volume free fluid within the cul-de-sac, likely physiologic. No organized abdominopelvic fluid collections. No pneumoperitoneum. No abdominal wall hernia. Musculoskeletal: No acute or significant osseous findings. IMPRESSION: 1. No acute abdominopelvic findings. Normal appendix. 2. Small volume free fluid  within the cul-de-sac, likely physiologic.   Electronically Signed   By: Duanne Guess D.O.   On: 11/08/2020 14:42   Assessment & Plan:  1. Chronic right-sided low back pain without sciatica - Encouraged her to keep a food diary and document when she has pain to try and link if this is food related.  Also suggested physical therapy since the pain seems musculoskeletal  - Ambulatory referral to Physical Therapy  2. Irregular menstrual bleeding - Clarified that she gets a period regularly q26-34 days, it just varies in heaviness. Reassured pt that this is a good sign but irregularity of bleeding could be due to stress, anovulatory cycles, etc. Encouraged her to address the back pain, exercise, eat a diet rich in vegetables, fruits, lean proteins and quality fats, work on stress modulation and see if that helps regulate the bleeding she has. Pt amenable to plan.  Follow up PRN or for annual exam.  Bernerd Limbo, CNM 12/06/2020 4:07 PM

## 2020-12-17 ENCOUNTER — Ambulatory Visit: Payer: Self-pay | Attending: Certified Nurse Midwife | Admitting: Physical Therapy

## 2020-12-17 ENCOUNTER — Other Ambulatory Visit: Payer: Self-pay

## 2020-12-17 DIAGNOSIS — G8929 Other chronic pain: Secondary | ICD-10-CM | POA: Insufficient documentation

## 2020-12-17 DIAGNOSIS — R1031 Right lower quadrant pain: Secondary | ICD-10-CM | POA: Insufficient documentation

## 2020-12-17 DIAGNOSIS — M545 Low back pain, unspecified: Secondary | ICD-10-CM | POA: Insufficient documentation

## 2020-12-17 NOTE — Therapy (Signed)
[facility] Outpatient Rehabilitation [facility] [address] [place], [place], [address] Phone: [phone]   Fax:  [phone]  Physical Therapy Evaluation  Patient Details  Name: Jenna Henry MRN: [id number] Date of Birth: Mar 30, 1996 Referring Provider (PT): Roma Schanz, CNM   Encounter Date: 12/17/2020   PT End of Session - 12/17/20 1420     Visit Number 1    Number of Visits 6    Date for PT Re-Evaluation 02/11/21    Authorization Type self pay, will have coverage 01/05/21    PT Start Time 1146    PT Stop Time 1230    PT Time Calculation (min) 44 min    Activity Tolerance Patient tolerated treatment well    Behavior During Therapy [facility] for tasks assessed/performed             Past Medical History:  Diagnosis Date   Anxiety    Asthma    Depression    Herpes    Polycystic ovaries     Past Surgical History:  Procedure Laterality Date   CYSTECTOMY      There were no vitals filed for this visit.    Subjective Assessment - 12/17/20 1151     Subjective Patient began to have Rt sided abdominal pain in May 2022.   She is dealing with abnormal periods and sought help from [facility].  The pain is now on the Rt side of belly and goes through to her Rt low back.  The pain is stronger in my back. No one has been able to tell me what my stomach pain is.  She had some back pain when she was a kid with mild scoliosis.  She did PT for 2 mos. as a high schooler in [place].    Pertinent History PCOS    Limitations Sitting;House hold activities;Lifting;Standing;Walking    Diagnostic tests CT normal    Patient Stated Goals Find what this is, ease the pain .  Always uncomfortable.    Currently in Pain? Yes    Pain Score 10-Worst pain ever    Pain Location Abdomen    Pain Orientation Right;Anterior;Posterior   front to back   Pain Descriptors / Indicators Sharp;Cramping    Pain Type Chronic pain    Pain Onset More than a month ago    Pain Frequency Intermittent     Aggravating Factors  constant    Pain Relieving Factors pillows, nothing really    Multiple Pain Sites No                [facility] PT Assessment - 12/17/20 0001       Assessment   Medical Diagnosis low back pain without sciatica    Referring Provider (PT) Roma Schanz, CNM    Onset Date/Surgical Date --   May 2022   Next MD Visit primary care in Sept.    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    Leisure likes to be in nature, walk, used to go the gym pre [employer]  Overall Cognitive Status Within Functional Limits for tasks assessed      Observation/Other Assessments   Focus on Therapeutic Outcomes (FOTO)  NT due to condition      Sensation   Light Touch Appears Intact      Coordination   Gross Motor Movements are Fluid and Coordinated Not tested      Squat   Comments good form      Posture/Postural Control   Posture/Postural Control Postural limitations    Postural Limitations Rounded Shoulders;Forward head    Posture Comments pelvis level, symmetrical , ant tilt      AROM   Overall AROM Comments Trunk ROM WNL, pain with Rt and LE sidebending on Rt side      Strength   Overall Strength Comments hips    Right Hip Flexion 4+/5    Right Hip Extension 5/5    Right Hip ABduction 5/5    Left Hip Flexion 5/5    Left Hip Extension 5/5      Palpation   Spinal mobility WNL    SI assessment  level, symmetrical    Palpation comment pain with psoas palpation, ASIS and Rt obliques, quadratus lumborum                        Objective measurements completed on examination: See above findings.                PT Education - 12/17/20 1420     Education Details PT/POC, HEP , musculoskeletal vs abdominal or joint pain    Person(s) Educated Patient    Methods Explanation;Demonstration;Handout    Comprehension Verbalized understanding;Returned demonstration                 PT Long Term Goals - 12/17/20 1423       PT LONG TERM GOAL #1   Title Pt will be I with HEP for lumbar and trunk strength and flexibility    Time 6    Period Weeks    Status New    Target Date 01/28/21      PT LONG TERM GOAL #2   Title Pt will be able to report 25% less pain in back due to reduced muscle tension and pelvic pain.    Time 6    Period Weeks    Status New    Target Date 01/28/21      PT LONG TERM GOAL #3   Title Pt wil be able to transfer, roll, sit up without increasing back pain    Time 6    Period Weeks    Status New    Target Date 02/11/21      PT LONG TERM GOAL #4   Title Pt will be able to stand for work with pain no more than 5/10 in low back    Time 6    Period Weeks    Status New    Target Date 02/11/21                    Plan - 12/17/20 1434     Clinical Impression Statement Patient presents for mod complexity eval of Rt sided back and pelvic pain. The pain she is experiencing is secondary to her abnormal period, pelvic discomfort. The pain did not start in her back, it has progressed to her back from her lower abdominals.   Her pain is musculoskeletal but it is either due to a progressive weakness due  to inactivity or a progression in her scoliosis which she was tlod she had years ago. She appeared to sit comfortably at a 10/10 level pain.  Palpation did reveal significant pain localized in Rt psoas, Rt QL and even in Rt obliques.  Her pain is constant and not dependent on activity. She did not have a pelvic asymmetry.  I think she needs an ultrasound or further testing to rule out kidney infection or other pathology.  She was given stretches and will see her after her  insurance takes effect in Aug. if she is feeling less pain overall.    Personal Factors and Comorbidities Comorbidity 1    Comorbidities PCOS    Examination-Activity Limitations Stand;Locomotion Level;Bend;Carry;Sit;Sleep;Transfers;Toileting;Squat;Lift;Bed Mobility    Examination-Participation Restrictions Cleaning;Shop;Community Activity;Interpersonal Relationship    Stability/Clinical Decision Making Evolving/Moderate complexity    Clinical Decision Making Moderate    Rehab Potential Excellent    PT Frequency 1x / week    PT Duration 8 weeks   6 visits   PT Treatment/Interventions ADLs/Self Care Home Management;Therapeutic exercise;Patient/family education;Manual techniques;Functional mobility training;Moist Heat    PT Next Visit Plan any changes with HEP? manual? core    PT Home Exercise Plan QL, hip flexor stretch, pelvic tilt    Recommended Other Services further diagnostics             Patient will benefit from skilled therapeutic intervention in order to improve the following deficits and impairments:  Decreased mobility, Pain, Impaired flexibility, Increased fascial restricitons  Visit Diagnosis: Right lower quadrant abdominal pain  Chronic right-sided low back pain without sciatica     Problem List Patient Active Problem List   Diagnosis Date Noted   Bipolar disorder (HCC) 10/11/2018   MDD (major depressive disorder) 10/10/2018   Intentional drug overdose (HCC) 10/09/2018   Hypokalemia 10/09/2018   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 [place], [place], [address] Phone: [phone]   Fax:  [phone]  Name: Maryjane Benedict MRN: [id number] Date of Birth: 1995-09-25  Karie Mainland, PT 12/17/20 2:43 PM Phone: [phone] Fax: [phone]

## 2020-12-17 NOTE — Patient Instructions (Signed)
Access Code: [email or web address] URL: [email or web address] Date: 12/17/2020 Prepared by: Karie Mainland  Exercises Supine Posterior Pelvic Tilt - 2 x daily - 7 x weekly - 2 sets - 10 reps - 5 hold Sidelying Quadratus Lumborum Stretch on Table - 2 x daily - 7 x weekly - 1 sets - 2-3 reps - 3-5 min hold Modified Thomas Stretch - 2 x daily - 7 x weekly - 1 sets - 5 reps - 30 hold

## 2021-01-06 ENCOUNTER — Other Ambulatory Visit: Payer: Self-pay

## 2021-01-06 ENCOUNTER — Ambulatory Visit: Payer: Self-pay | Attending: Certified Nurse Midwife | Admitting: Physical Therapy

## 2021-01-06 ENCOUNTER — Encounter: Payer: Self-pay | Admitting: Physical Therapy

## 2021-01-06 DIAGNOSIS — M545 Low back pain, unspecified: Secondary | ICD-10-CM | POA: Insufficient documentation

## 2021-01-06 DIAGNOSIS — G8929 Other chronic pain: Secondary | ICD-10-CM | POA: Insufficient documentation

## 2021-01-06 DIAGNOSIS — R1031 Right lower quadrant pain: Secondary | ICD-10-CM | POA: Insufficient documentation

## 2021-01-06 NOTE — Therapy (Signed)
[facility] Outpatient Rehabilitation [facility] [address] [place], [place], [address] Phone: [phone]   Fax:  [phone]  Physical Therapy Treatment  Patient Details  Name: Jenna Henry MRN: [id number] Date of Birth: July 19, 1995 Referring Provider (PT): Roma Schanz, CNM   Encounter Date: 01/06/2021   PT End of Session - 01/06/21 1421     Visit Number 2    Number of Visits 6    Date for PT Re-Evaluation 02/11/21    Authorization Type self pay, will have coverage 01/05/21    PT Start Time 1420    PT Stop Time 1504    PT Time Calculation (min) 44 min    Activity Tolerance Patient tolerated treatment well             Past Medical History:  Diagnosis Date   Anxiety    Asthma    Depression    Herpes    Polycystic ovaries     Past Surgical History:  Procedure Laterality Date   CYSTECTOMY      There were no vitals filed for this visit.   Subjective Assessment - 01/06/21 1420     Subjective The pain is now mostly in my Rt back, less abdominal pain . Has not seen any other health care providers for this .    Currently in Pain? Yes    Pain Score 6                  [facility] Adult PT Treatment/Exercise - 01/06/21 0001       Self-Care   Self-Care Other Self-Care Comments;Posture    Posture abdominals weak    Other Self-Care Comments  HEP, core, Quadratus lumborum, tennis ball for self care      Lumbar Exercises: Stretches   Double Knee to Chest Stretch 60 seconds    Lower Trunk Rotation 10 seconds    Lower Trunk Rotation Limitations x 10    Other Lumbar Stretch Exercise sidelying quadratus for Rt side x 3 min      Lumbar Exercises: Aerobic   Nustep 5 min L5      Lumbar Exercises: Supine   Pelvic Tilt 10 reps    Bridge with Ball Squeeze 10 reps    Straight Leg Raise 10 reps    Straight Leg Raises Limitations ball under pelvis , ab set      Lumbar Exercises: Quadruped   Madcat/Old Horse 10 reps    Other Quadruped Lumbar Exercises  childs pose, forward and lateral, thread the needle x30 sec      Manual Therapy   Manual therapy comments Rt Quadratus in sidelying, brief< 5 min during stretching                         PT Long Term Goals - 12/17/20 1423       PT LONG TERM GOAL #1   Title Pt will be I with HEP for lumbar and trunk strength and flexibility    Time 6    Period Weeks    Status New    Target Date 01/28/21      PT LONG TERM GOAL #2   Title Pt will be able to report 25% less pain in back due to reduced muscle tension and pelvic pain.    Time 6    Period Weeks    Status New    Target Date 01/28/21      PT LONG TERM GOAL #3   Title  Pt wil be able to transfer, roll, sit up without increasing back pain    Time 6    Period Weeks    Status New    Target Date 02/11/21      PT LONG TERM GOAL #4   Title Pt will be able to stand for work with pain no more than 5/10 in low back    Time 6    Period Weeks    Status New    Target Date 02/11/21                   Plan - 01/06/21 1421     Clinical Impression Statement Patient with reportedly less pain in Rt abdominal region.  Discussed muscle attachments and possibility of referred pain .  Quadratus lumborum in spasm with poor tolerance to manual, self release more beneifical to patient than manual. She shows hyperlordosis and stiffness throughout thoracolumbar spine.  Weakness in abdominal wall.  Added to HEP as it did feel good and reduce tension.  She has new insurance and will schedule once she confirms coverage.    PT Treatment/Interventions ADLs/Self Care Home Management;Therapeutic exercise;Patient/family education;Manual techniques;Functional mobility training;Moist Heat    PT Next Visit Plan Cont core, posture, spine mobility, manual if tolerated.    PT Home Exercise Plan [email or web address]:  QL, hip flexor stretch, pelvic tilt    Consulted and Agree with Plan of Care Patient             Patient will benefit from skilled  therapeutic intervention in order to improve the following deficits and impairments:  Decreased mobility, Pain, Impaired flexibility, Increased fascial restricitons  Visit Diagnosis: Right lower quadrant abdominal pain  Chronic right-sided low back pain without sciatica     Problem List Patient Active Problem List   Diagnosis Date Noted   Bipolar disorder (HCC) 10/11/2018   MDD (major depressive disorder) 10/10/2018   Intentional drug overdose (HCC) 10/09/2018   Hypokalemia 10/09/2018   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   Palpitations 11/09/2017   Chest pain 11/09/2017   Cigarette smoker 11/09/2017   Severe major depression without psychotic features (HCC) 02/28/2017   MDD (major depressive disorder), recurrent, severe, 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   Suicide attempt (HCC) 02/28/2017   Anxiety state 02/28/2017   Insomnia 02/28/2017   Herpes 11/12/2016   Irregular menstruation 07/06/2016    Jenna Henry 01/06/2021, 3:12 PM  [facility] Outpatient Rehabilitation [facility] [address] [place], [place], [address] Phone: [phone]   Fax:  [phone]  Name: Jenna Henry MRN: [id number] Date of Birth: 24-Feb-1996  Karie Mainland, PT 01/06/21 3:12 PM Phone: [phone] Fax: [phone]

## 2021-01-07 ENCOUNTER — Encounter (HOSPITAL_BASED_OUTPATIENT_CLINIC_OR_DEPARTMENT_OTHER): Payer: Self-pay | Admitting: Obstetrics and Gynecology

## 2021-01-07 ENCOUNTER — Emergency Department (HOSPITAL_BASED_OUTPATIENT_CLINIC_OR_DEPARTMENT_OTHER)
Admission: EM | Admit: 2021-01-07 | Discharge: 2021-01-07 | Disposition: A | Discharge status: Home / Self Care | Payer: Medicaid Other | Attending: Emergency Medicine | Admitting: Emergency Medicine

## 2021-01-07 DIAGNOSIS — L02411 Cutaneous abscess of right axilla: Secondary | ICD-10-CM | POA: Insufficient documentation

## 2021-01-07 DIAGNOSIS — Z5321 Procedure and treatment not carried out due to patient leaving prior to being seen by health care provider: Secondary | ICD-10-CM | POA: Insufficient documentation

## 2021-01-07 NOTE — ED Triage Notes (Signed)
Patient reports to the ER for right axilla abscess/cyst. Patient reports she has had them removed surgically before but they keep coming back. Reports no formal diagnosis of hydradenitis

## 2021-01-14 ENCOUNTER — Other Ambulatory Visit: Payer: Self-pay

## 2021-01-14 ENCOUNTER — Encounter: Payer: Self-pay | Admitting: Physical Therapy

## 2021-01-14 ENCOUNTER — Ambulatory Visit: Payer: Self-pay | Admitting: Physical Therapy

## 2021-01-14 DIAGNOSIS — M545 Low back pain, unspecified: Secondary | ICD-10-CM

## 2021-01-14 DIAGNOSIS — R1031 Right lower quadrant pain: Secondary | ICD-10-CM

## 2021-01-14 DIAGNOSIS — G8929 Other chronic pain: Secondary | ICD-10-CM

## 2021-01-14 NOTE — Patient Instructions (Signed)

## 2021-01-14 NOTE — Therapy (Signed)
[facility] Outpatient Rehabilitation [facility] [address] [place], [place], [address] Phone: [phone]   Fax:  [phone]  Physical Therapy Treatment  Patient Details  Name: Jenna Henry MRN: [id number] Date of Birth: [birth year] Referring Provider (PT): Roma Schanz, CNM   Encounter Date: 01/14/2021   PT End of Session - 01/14/21 1228     Visit Number 3    Number of Visits 6    Date for PT Re-Evaluation 02/11/21    Authorization Type self pay, will have coverage 01/05/21    PT Start Time 1152    PT Stop Time 1235    PT Time Calculation (min) 43 min    Activity Tolerance Patient tolerated treatment well    Behavior During Therapy [facility] for tasks assessed/performed             Past Medical History:  Diagnosis Date   Anxiety    Asthma    Depression    Herpes    Polycystic ovaries     Past Surgical History:  Procedure Laterality Date   CYSTECTOMY      There were no vitals filed for this visit.   Subjective Assessment - 01/14/21 1156     Subjective Has been working [employer], came from work today.  I can tolerate it more but it goes up to 7/10 at times.    Currently in Pain? Yes    Pain Score 5     Pain Location Back    Pain Orientation Right    Pain Descriptors / Indicators Aching;Sharp;Sore;Spasm    Pain Type Chronic pain    Pain Onset More than a month ago    Pain Frequency Intermittent    Aggravating Factors  moving wrong    Pain Relieving Factors rest , HEP stretches and tennis ball    Multiple Pain Sites No               [facility] Adult PT Treatment/Exercise:  Therapeutic Exercise: - supine LTR x 10 knees bent, then crossed Rt LE over left with rotation of LTR Upper trunk rotation x 5 with deep breathing Supine bridging with articulation x 10  Knees to chest  (double ) x 1 , 30 sec   Quadruped cat and camel x 10  Lateral bias with slight flexion x 3 to the Lt  Sidelying prolonged QL stretch  for manual with pilowunder hips    Manual Therapy: - Tr P therapy to Rt QL and lumbar paraspinals , multifidi in sidelying   10 min MHP Rt QL     PT Long Term Goals - 12/17/20 1423       PT LONG TERM GOAL #1   Title Pt will be I with HEP for lumbar and trunk strength and flexibility    Time 6    Period Weeks    Status New    Target Date 01/28/21      PT LONG TERM GOAL #2   Title Pt will be able to report 25% less pain in back due to reduced muscle tension and pelvic pain.    Time 6    Period Weeks    Status New    Target Date 01/28/21      PT LONG TERM GOAL #3   Title Pt wil be able to transfer, roll, sit up without increasing back pain    Time 6    Period Weeks    Status New    Target Date 02/11/21  PT LONG TERM GOAL #4   Title Pt will be able to stand for work with pain no more than 5/10 in low back    Time 6    Period Weeks    Status New    Target Date 02/11/21                   Plan - 01/14/21 1159     Clinical Impression Statement Patient's pain has become more intermittent, spasm in Rt QL with manual therapy. May benefit from dry needling, given info today to consider.    PT Treatment/Interventions ADLs/Self Care Home Management;Therapeutic exercise;Patient/family education;Manual techniques;Functional mobility training;Moist Heat    PT Next Visit Plan Cont core, posture, spine mobility, manual if tolerated.    PT Home Exercise Plan [email or web address]:  QL, hip flexor stretch, pelvic tilt    Consulted and Agree with Plan of Care Patient             Patient will benefit from skilled therapeutic intervention in order to improve the following deficits and impairments:  Decreased mobility, Pain, Impaired flexibility, Increased fascial restricitons  Visit Diagnosis: Chronic right-sided low back pain without sciatica  Right lower quadrant abdominal pain     Problem List Patient Active Problem List   Diagnosis Date Noted   Bipolar disorder (HCC) 10/11/2018   MDD (major  depressive disorder) 10/10/2018   Intentional drug overdose (HCC) 10/09/2018   Hypokalemia 10/09/2018   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 [place], [place], [address] Phone: [phone]   Fax:  [phone]  Name: Jenna Henry MRN: [id number] Date of Birth: Nov 11, 1995  Karie Mainland, PT 01/14/21 12:31 PM Phone: [phone] Fax: [phone]

## 2021-01-18 ENCOUNTER — Other Ambulatory Visit: Payer: Self-pay

## 2021-01-18 ENCOUNTER — Ambulatory Visit (HOSPITAL_COMMUNITY)
Admission: EM | Admit: 2021-01-18 | Discharge: 2021-01-18 | Disposition: A | Discharge status: Home / Self Care | Payer: PRIVATE HEALTH INSURANCE | Attending: Emergency Medicine | Admitting: Emergency Medicine

## 2021-01-18 ENCOUNTER — Encounter (HOSPITAL_COMMUNITY): Payer: Self-pay | Admitting: *Deleted

## 2021-01-18 DIAGNOSIS — M7918 Myalgia, other site: Secondary | ICD-10-CM

## 2021-01-18 MED ORDER — CYCLOBENZAPRINE HCL 10 MG PO TABS: 10.0000 mg | ORAL_TABLET | Freq: Every day | ORAL | 0 refills | Status: DC

## 2021-01-18 MED ORDER — IBUPROFEN 800 MG PO TABS
800.0000 mg | ORAL_TABLET | Freq: Three times a day (TID) | ORAL | 0 refills | Status: DC
Start: 2021-01-18 — End: 2021-02-11

## 2021-01-18 MED ORDER — KETOROLAC TROMETHAMINE 30 MG/ML IJ SOLN: 30.0000 mg | Freq: Once | INTRAMUSCULAR | Status: DC

## 2021-01-18 MED ORDER — IBUPROFEN 800 MG PO TABS: 800.0000 mg | ORAL_TABLET | Freq: Four times a day (QID) | ORAL | 0 refills | Status: DC | PRN

## 2021-01-18 NOTE — Discharge Instructions (Addendum)
Your pain today is most likely musculoskeletal, there was no direct pain over your hip bone or your tailbone  You can take ibuprofen 3 times a day with food for the next 5 days then as needed, you can take Flexeril at bedtime for additional comfort, be mindful this medication may make you drowsy  Can continue use of heating pad over area in 15-minute intervals   As pain lessens continue to try to walk and put pressure onto the area  If pain persist for 2 weeks you may follow-up at urgent care or with orthopedic specialist for further evaluation

## 2021-01-18 NOTE — ED Triage Notes (Signed)
Pt fell down steps yesterday . Pt reports slidding down steps on buttocks and now has tail bone pain.

## 2021-01-18 NOTE — ED Provider Notes (Signed)
[facility]    CSN: [id number] Arrival date & time: 01/18/21  1026      History   Chief Complaint Chief Complaint  Patient presents with   Tailbone Pain    HPI Janyra Barillas is a 25 y.o. female.   Patient presents with pain to the left buttock and left posterior thigh beginning yesterday evening after falling and sliding down steps.  Painful to lie on side and to apply weight to the leg.  Range of motion intact.  Denies any numbness or tingling.  Endorses mild swelling to the area but no bruising.  Took 2 Aleve's and using heating pad which did provide some relief but pain came back. 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 Authorizing Provider  cyclobenzaprine (FLEXERIL) 10 MG tablet Take 1 tablet (10 mg total) by mouth at bedtime. 01/18/21  Yes Evamae Rowen R, NP  albuterol (VENTOLIN HFA) 108 (90 Base) MCG/ACT inhaler Inhale 2  puffs into the lungs every 4 (four) hours as needed for wheezing or shortness of breath. 08/02/20   Rushie Chestnut, PA-C  guaiFENesin (ROBITUSSIN) 100 MG/5ML SOLN Take 5 mLs by mouth every 4 (four) hours as needed for cough or to loosen phlegm. Patient not taking: Reported on 12/17/2020    [provider]  ibuprofen (ADVIL) 800 MG tablet Take 1 tablet (800 mg total) by mouth 3 (three) times daily. 01/18/21   Grove Defina, Elita Boone, NP  montelukast (SINGULAIR) 10 MG tablet Take 1 tablet (10 mg total) by mouth at bedtime. Patient not taking: Reported on 12/17/2020 03/31/20   Moshe Cipro, NP  traMADol (ULTRAM) 50 MG tablet Take 1 tablet (50 mg total) by mouth every 6 (six) hours as needed. Patient not taking: No sig reported 11/08/20   Vanetta Mulders, MD  venlafaxine XR (EFFEXOR-XR) 75 MG 24 hr capsule Take 1 capsule (75 mg total) by mouth daily with breakfast. Patient not taking: Reported on 02/22/2019 10/14/18 04/12/19  Money, Gerlene Burdock, FNP    Family History Family History  Problem Relation Age of Onset   Hypertension Mother    Anxiety disorder Mother    Heart murmur Father  Heart disease Maternal Grandfather    Diabetes Paternal Grandmother    Cancer Paternal Grandmother     Social History Social History   Tobacco Use   Smoking status: Former    Packs/day: 0.50    Types: Cigarettes   Smokeless tobacco: Never   Tobacco comments:    4 cigarettes per day  Vaping Use   Vaping Use: Every day   Substances: Nicotine, Flavoring  Substance Use Topics   Alcohol use: Yes    Alcohol/week: 2.0 standard drinks    Types: 2 Cans of beer per week    Comment: occassional   Drug use: Yes    Types: Marijuana     Allergies   Ortho tri-cyclen [norgestimate-eth estradiol]   Review of Systems Review of Systems  Constitutional: Negative.   Respiratory: Negative.    Cardiovascular: Negative.   Skin: Negative.   Neurological: Negative.     Physical Exam Triage Vital  Signs ED Triage Vitals  Enc Vitals Group     BP 01/18/21 1123 96/70     Pulse Rate 01/18/21 1123 84     Resp 01/18/21 1123 16     Temp 01/18/21 1123 98.1 F (36.7 C)     Temp src --      SpO2 01/18/21 1123 100 %     Weight --      Height --      Head Circumference --      Peak Flow --      Pain Score 01/18/21 1125 9     Pain Loc --      Pain Edu? --      Excl. in GC? --    No data found.  Updated Vital Signs BP 96/70   Pulse 84   Temp 98.1 F (36.7 C)   Resp 16   LMP 01/15/2021   SpO2 100%   Visual Acuity Right Eye Distance:   Left Eye Distance:   Bilateral Distance:    Right Eye Near:   Left Eye Near:    Bilateral Near:     Physical Exam Constitutional:      Appearance: Normal appearance. She is normal weight.  Eyes:     Extraocular Movements: Extraocular movements intact.  Pulmonary:     Effort: Pulmonary effort is normal.  Musculoskeletal:       Legs:  Neurological:     Mental Status: She is alert and oriented to person, place, and time. Mental status is at baseline.  Psychiatric:        Mood and Affect: Mood normal.        Behavior: Behavior normal.     UC Treatments / Results  Labs (all labs ordered are listed, but only abnormal results are displayed) Labs Reviewed - No data to display  EKG   Radiology No results found.  Procedures Procedures (including critical care time)  Medications Ordered in UC Medications - No data to display  Initial Impression / Assessment and Plan / UC Course  I have reviewed the triage vital signs and the nursing notes.  Pertinent labs & imaging results that were available during my care of the patient were reviewed by me and considered in my medical decision making (see chart for details).  Acute buttock pain  Etiology most likely muscular, will move forward treating conservatively and defer x-rays at this time  1.  Ibuprofen 800 mg 3 times daily for 5 days then as needed 2.  Flexeril 10 mg at  bedtime. 3.  Heating pad in 15-minute intervals for additional comfort 4.  Follow-up in urgent care or with orthopedic specialist for persistent pain in 2 weeks Final Clinical Impressions(s) / UC Diagnoses   Final diagnoses:  Acute buttock pain     Discharge Instructions      Your pain today is most likely musculoskeletal, there was no direct pain over your hip bone or your tailbone  You can take ibuprofen 3 times a day with food for the next 5 days then as needed, you can take Flexeril at bedtime for additional comfort, be mindful this medication may make you drowsy  Can continue use of heating pad over area in 15-minute intervals   As pain lessens continue to try to walk and put pressure onto the area  If pain persist for 2 weeks you may follow-up at urgent care or with orthopedic specialist for further evaluation     ED Prescriptions     Medication Sig Dispense Auth. Provider   ibuprofen (ADVIL) 800 MG tablet  (Status: Discontinued) Take 1 tablet (800 mg total) by mouth every 6 (six) hours as needed. 50 tablet Tearah Saulsbury R, NP   cyclobenzaprine (FLEXERIL) 10 MG tablet Take 1 tablet (10 mg total) by mouth at bedtime. 10 tablet Kavon Valenza R, NP   ibuprofen (ADVIL) 800 MG tablet Take 1 tablet (800 mg total) by mouth 3 (three) times daily. 50 tablet Valinda Hoar, NP      PDMP not reviewed this encounter.   Valinda Hoar, NP 01/18/21 [phone]

## 2021-01-27 ENCOUNTER — Ambulatory Visit: Payer: Self-pay | Admitting: Physical Therapy

## 2021-01-27 ENCOUNTER — Other Ambulatory Visit: Payer: Self-pay

## 2021-01-27 ENCOUNTER — Encounter: Payer: Self-pay | Admitting: Physical Therapy

## 2021-01-27 DIAGNOSIS — R1031 Right lower quadrant pain: Secondary | ICD-10-CM

## 2021-01-27 DIAGNOSIS — G8929 Other chronic pain: Secondary | ICD-10-CM

## 2021-01-27 DIAGNOSIS — M545 Low back pain, unspecified: Secondary | ICD-10-CM

## 2021-01-27 NOTE — Therapy (Addendum)
[facility] [place], [place], [address] Phone: [phone]   Fax:  [phone]  Physical Therapy Treatment / Discharge  Patient Details  Name: Jenna Henry MRN: [id number] Date of Birth: Jul 18, 1995 Referring Provider (PT): Jasper Loser, CNM   Encounter Date: 01/27/2021   PT End of Session - 01/27/21 1418     Visit Number 3    Number of Visits 6    Date for PT Re-Evaluation 02/11/21    Authorization Type self pay, will have coverage 01/05/21    PT Start Time 1417             Past Medical History:  Diagnosis Date   Anxiety    Asthma    Depression    Herpes    Polycystic ovaries     Past Surgical History:  Procedure Laterality Date   CYSTECTOMY      There were no vitals filed for this visit.   Subjective Assessment - 01/27/21 1421     Subjective "I recently had an incident and fell down about 10 steps that occurred on 8/19 and went to the ED on 8/20. I am more sore in the middle of my low back. They said I bruised my hip but noted I had no fx or anything."    Patient Stated Goals Find what this is, ease the pain .  Always uncomfortable.                                            PT Long Term Goals - 12/17/20 1423       PT LONG TERM GOAL #1   Title Pt will be I with HEP for lumbar and trunk strength and flexibility    Time 6    Period Weeks    Status New    Target Date 01/28/21      PT LONG TERM GOAL #2   Title Pt will be able to report 25% less pain in back due to reduced muscle tension and pelvic pain.    Time 6    Period Weeks    Status New    Target Date 01/28/21      PT LONG TERM GOAL #3   Title Pt wil be able to transfer, roll, sit up without increasing back pain    Time 6    Period Weeks    Status New    Target Date 02/11/21      PT LONG TERM GOAL #4   Title Pt will be able to stand for work with pain no more than 5/10 in low back    Time 6     Period Weeks    Status New    Target Date 02/11/21                   Plan - 01/27/21 1431     Clinical Impression Statement pt arrives to session noting she had fallen down the stairs since her last session and was having increased back / hip pain. She plans to get set up with a PCP on 9/13 and anticipates she will get referred to a GI or Kidney specialist for further assessment. She requested that since she is having more pain and is planning to see a MD to hold off on today's session and reschedule until after she has been seen.  PT Treatment/Interventions ADLs/Self Care Home Management;Therapeutic exercise;Patient/family education;Manual techniques;Functional mobility training;Moist Heat    PT Next Visit Plan Cont core, posture, spine mobility, manual if tolerated.    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   Palpitations 11/09/2017   Chest pain 11/09/2017   Cigarette smoker 11/09/2017   Severe major depression without psychotic features ([place]) 02/28/2017   MDD (major depressive disorder), recurrent, severe, with psychosis ([place]) 02/28/2017   Suicide attempt ([place]) 02/28/2017   Anxiety state 02/28/2017   Insomnia 02/28/2017   Herpes 11/12/2016   Irregular menstruation 07/06/2016   Starr Lake PT, DPT, LAT, ATC  01/27/21  2:35 PM     [facility] [address] [place],  [place], [address] Phone: [phone]   Fax:  [phone]  Name: Jenna Henry MRN: [id number] Date of Birth: Sep 15, 1995       PHYSICAL THERAPY DISCHARGE SUMMARY  Visits from Start of Care: 3  Current functional level related to goals / functional outcomes: See goals   Remaining deficits: Current status unknown   Education / Equipment: HEP, theraband, posture education   Patient agrees to discharge. Patient goals were not met. Patient is being discharged due to not returning since the last visit.  Kanai Hilger PT, DPT, LAT, ATC  03/23/21  8:13 PM

## 2021-02-05 ENCOUNTER — Encounter: Payer: Medicaid Other | Admitting: Physical Therapy

## 2021-02-11 ENCOUNTER — Other Ambulatory Visit: Payer: Self-pay

## 2021-02-11 ENCOUNTER — Encounter: Payer: Self-pay | Admitting: Nurse Practitioner

## 2021-02-11 ENCOUNTER — Ambulatory Visit: Payer: PRIVATE HEALTH INSURANCE | Attending: Nurse Practitioner | Admitting: Nurse Practitioner

## 2021-02-11 VITALS — BP 114/77 | HR 72 | Resp 16 | Wt 175.8 lb

## 2021-02-11 DIAGNOSIS — E282 Polycystic ovarian syndrome: Secondary | ICD-10-CM

## 2021-02-11 DIAGNOSIS — R109 Unspecified abdominal pain: Secondary | ICD-10-CM

## 2021-02-11 DIAGNOSIS — Z1159 Encounter for screening for other viral diseases: Secondary | ICD-10-CM

## 2021-02-11 MED ORDER — IBUPROFEN 800 MG PO TABS
800.0000 mg | ORAL_TABLET | Freq: Three times a day (TID) | ORAL | 1 refills | Status: DC
  Filled 2021-02-11: qty 60, 20d supply, fill #0

## 2021-02-11 NOTE — Progress Notes (Signed)
Assessment & Plan:  Jenna Henry was seen today for new patient (initial visit).  Diagnoses and all orders for this visit:  PCOS (polycystic ovarian syndrome) -     ibuprofen (ADVIL) 800 MG tablet; Take 1 tablet (800 mg total) by mouth 3 (three) times daily. (Patient not taking: Reported on 02/11/2021) -     US PELVIC COMPLETE WITH TRANSVAGINAL; Future -     Urinalysis, Complete  Right flank pain -     ibuprofen (ADVIL) 800 MG tablet; Take 1 tablet (800 mg total) by mouth 3 (three) times daily. (Patient not taking: Reported on 02/11/2021) -     US PELVIC COMPLETE WITH TRANSVAGINAL; Future -     Urinalysis, Complete  Need for hepatitis C screening test -     HCV Ab w Reflex to Quant PCR   Patient has been counseled on age-appropriate routine health concerns for screening and prevention. These are reviewed and up-to-date. Referrals have been placed accordingly. Immunizations are up-to-date or declined.    Subjective:   Chief Complaint  Patient presents with   New Patient (Initial Visit)   HPI Jenna Henry 25 y.o. female presents to office today to establish care.  She has a past medical history of Anxiety, Asthma, Depression, Herpes, and Polycystic ovaries.   Abdominal Pain: Patient complains of abdominal pain. The pain is described as aching, sharp, and shooting, and is 9/10 in intensity when the pain is at its worst. Pain is located in the RLQ with radiation to right back. Onset was several years ago. Symptoms have been unchanged since. Aggravating factors: none.  Alleviating factors: NSAIDs. Associated symptoms: nausea. The patient denies anorexia, constipation, diarrhea, dysuria, hematochezia, hematuria, melena, and vomiting. She has normal bowel movements today.  She has a history of PCOS. Tried oral contraceptives in the past to help regulate her menstrual cycles. Pain is unrelated to dietary intake, menstrual cycles or activity.  CT ABDOMEN 11-08-20 1. No acute abdominopelvic  findings. Normal appendix. 2. Small volume free fluid within the cul-de-sac, likely physiologic.  Pelvic US 03-02-2019 Numerous follicles in the periphery of the RIGHT ovary, nonspecific but can be seen with polycystic ovarian syndrome, recommend clinical and laboratory correlation.   Review of Systems  Constitutional:  Negative for fever, malaise/fatigue and weight loss.  HENT: Negative.  Negative for nosebleeds.   Eyes: Negative.  Negative for blurred vision, double vision and photophobia.  Respiratory: Negative.  Negative for cough and shortness of breath.   Cardiovascular: Negative.  Negative for chest pain, palpitations and leg swelling.  Gastrointestinal:  Positive for abdominal pain. Negative for blood in stool, constipation, diarrhea, heartburn, melena, nausea and vomiting.  Genitourinary: Negative.   Musculoskeletal: Negative.  Negative for myalgias.  Neurological: Negative.  Negative for dizziness, focal weakness, seizures and headaches.  Psychiatric/Behavioral: Negative.  Negative for suicidal ideas.    Past Medical History:  Diagnosis Date   Anxiety    Asthma    Depression    Herpes    Polycystic ovaries     Past Surgical History:  Procedure Laterality Date   CYSTECTOMY      Family History  Problem Relation Age of Onset   Hypertension Mother    Anxiety disorder Mother    Heart murmur Father    Heart disease Maternal Grandfather    Diabetes Paternal Grandmother    Cancer Paternal Grandmother     Social History Reviewed with no changes to be made today.   Outpatient Medications Prior to Visit  Medication Sig Dispense Refill   albuterol (VENTOLIN HFA) 108 (90 Base) MCG/ACT inhaler Inhale 2 puffs into the lungs every 4 (four) hours as needed for wheezing or shortness of breath. 18 g 0   cyclobenzaprine (FLEXERIL) 10 MG tablet Take 1 tablet (10 mg total) by mouth at bedtime. (Patient not taking: Reported on 02/11/2021) 10 tablet 0   guaiFENesin (ROBITUSSIN)  100 MG/5ML SOLN Take 5 mLs by mouth every 4 (four) hours as needed for cough or to loosen phlegm. (Patient not taking: Reported on 12/17/2020)     ibuprofen (ADVIL) 800 MG tablet Take 1 tablet (800 mg total) by mouth 3 (three) times daily. 50 tablet 0   montelukast (SINGULAIR) 10 MG tablet Take 1 tablet (10 mg total) by mouth at bedtime. (Patient not taking: Reported on 12/17/2020) 30 tablet 2   traMADol (ULTRAM) 50 MG tablet Take 1 tablet (50 mg total) by mouth every 6 (six) hours as needed. (Patient not taking: No sig reported) 15 tablet 0   No facility-administered medications prior to visit.    Allergies  Allergen Reactions   Ortho Tri-Cyclen [Norgestimate-Eth Estradiol] Hives, Itching, Rash and Other (See Comments)    "Burning all over"       Objective:    BP 114/77   Pulse 72   Resp 16   Wt 175 lb 12.8 oz (79.7 kg)   LMP 01/15/2021   SpO2 98%   BMI 28.37 kg/m  Wt Readings from Last 3 Encounters:  02/11/21 175 lb 12.8 oz (79.7 kg)  01/07/21 178 lb (80.7 kg)  12/04/20 176 lb 3.2 oz (79.9 kg)    Physical Exam Vitals and nursing note reviewed.  Constitutional:      Appearance: She is well-developed.  HENT:     Head: Normocephalic and atraumatic.  Cardiovascular:     Rate and Rhythm: Normal rate and regular rhythm.     Heart sounds: Normal heart sounds. No murmur heard.   No friction rub. No gallop.  Pulmonary:     Effort: Pulmonary effort is normal. No tachypnea or respiratory distress.     Breath sounds: Normal breath sounds. No decreased breath sounds, wheezing, rhonchi or rales.  Chest:     Chest wall: No tenderness.  Abdominal:     Palpations: Abdomen is soft.     Tenderness: There is no guarding.  Musculoskeletal:        General: Normal range of motion.     Cervical back: Normal range of motion.  Skin:    General: Skin is warm and dry.  Neurological:     Mental Status: She is alert and oriented to person, place, and time.     Coordination: Coordination  normal.  Psychiatric:        Behavior: Behavior normal. Behavior is cooperative.        Thought Content: Thought content normal.        Judgment: Judgment normal.         Patient has been counseled extensively about nutrition and exercise as well as the importance of adherence with medications and regular follow-up. The patient was given clear instructions to go to ER or return to medical center if symptoms don't improve, worsen or new problems develop. The patient verbalized understanding.   Follow-up: Return in about 3 months (around 05/13/2021) for physical .   Claiborne Rigg, FNP-BC [facility] [place], [place] [phone]   02/11/2021, 1:14 PM

## 2021-02-12 ENCOUNTER — Ambulatory Visit: Payer: PRIVATE HEALTH INSURANCE | Attending: Certified Nurse Midwife | Admitting: Physical Therapy

## 2021-02-12 ENCOUNTER — Encounter: Payer: Self-pay | Admitting: Physical Therapy

## 2021-02-12 ENCOUNTER — Telehealth: Payer: Self-pay | Admitting: Physical Therapy

## 2021-02-12 LAB — URINALYSIS, COMPLETE
Bilirubin, UA: NEGATIVE
Glucose, UA: NEGATIVE
Nitrite, UA: NEGATIVE
Specific Gravity, UA: 1.025 (ref 1.005–1.030)
Urobilinogen, Ur: 1 mg/dL (ref 0.2–1.0)
pH, UA: 6 (ref 5.0–7.5)

## 2021-02-12 LAB — MICROSCOPIC EXAMINATION
Casts: NONE SEEN /lpf
Epithelial Cells (non renal): >10 /hpf — AB (ref 0–10)

## 2021-02-12 NOTE — Telephone Encounter (Signed)
Attempted to call pt regarding missed appointment today. Unable to leave message VM not set up.  Brendaliz Kuk PT, DPT, LAT, ATC  02/12/21  2:53 PM

## 2021-02-13 ENCOUNTER — Other Ambulatory Visit: Payer: Self-pay | Admitting: Nurse Practitioner

## 2021-02-17 ENCOUNTER — Other Ambulatory Visit: Payer: Self-pay

## 2021-02-17 ENCOUNTER — Ambulatory Visit (HOSPITAL_COMMUNITY)
Admission: RE | Admit: 2021-02-17 | Discharge: 2021-02-17 | Disposition: A | Discharge status: Home / Self Care | Payer: PRIVATE HEALTH INSURANCE | Source: Ambulatory Visit | Attending: Nurse Practitioner | Admitting: Nurse Practitioner

## 2021-02-17 ENCOUNTER — Other Ambulatory Visit: Payer: Self-pay | Admitting: Nurse Practitioner

## 2021-02-17 DIAGNOSIS — R109 Unspecified abdominal pain: Secondary | ICD-10-CM | POA: Insufficient documentation

## 2021-02-17 DIAGNOSIS — E282 Polycystic ovarian syndrome: Secondary | ICD-10-CM | POA: Diagnosis not present

## 2021-02-17 DIAGNOSIS — J4541 Moderate persistent asthma with (acute) exacerbation: Secondary | ICD-10-CM

## 2021-02-17 NOTE — Telephone Encounter (Signed)
Requested medications are due for refill today.  yes  Requested medications are on the active medications list.  yes  Last refill. 08/02/2020  Future visit scheduled.   yes  Notes to clinic.  Rx signed by Clent Jacks.

## 2021-02-18 ENCOUNTER — Other Ambulatory Visit: Payer: Self-pay

## 2021-02-19 ENCOUNTER — Ambulatory Visit: Payer: PRIVATE HEALTH INSURANCE | Admitting: Physical Therapy

## 2021-02-19 ENCOUNTER — Other Ambulatory Visit: Payer: Self-pay

## 2021-02-19 MED ORDER — ALBUTEROL SULFATE HFA 108 (90 BASE) MCG/ACT IN AERS
2.0000 | INHALATION_SPRAY | RESPIRATORY_TRACT | 0 refills | Status: DC | PRN
  Filled 2021-02-19: qty 18, 16d supply, fill #0

## 2021-02-20 ENCOUNTER — Encounter: Payer: Self-pay | Admitting: Nurse Practitioner

## 2021-02-21 ENCOUNTER — Other Ambulatory Visit: Payer: Self-pay | Admitting: Nurse Practitioner

## 2021-02-21 ENCOUNTER — Other Ambulatory Visit: Payer: Self-pay

## 2021-02-21 DIAGNOSIS — R1031 Right lower quadrant pain: Secondary | ICD-10-CM

## 2021-02-24 ENCOUNTER — Other Ambulatory Visit: Payer: Self-pay

## 2021-02-24 ENCOUNTER — Other Ambulatory Visit: Payer: Self-pay | Admitting: Nurse Practitioner

## 2021-02-24 ENCOUNTER — Encounter (HOSPITAL_BASED_OUTPATIENT_CLINIC_OR_DEPARTMENT_OTHER): Payer: Self-pay | Admitting: Emergency Medicine

## 2021-02-24 ENCOUNTER — Emergency Department (HOSPITAL_BASED_OUTPATIENT_CLINIC_OR_DEPARTMENT_OTHER)
Admission: EM | Admit: 2021-02-24 | Discharge: 2021-02-24 | Disposition: A | Discharge status: Home / Self Care | Payer: PRIVATE HEALTH INSURANCE | Attending: Emergency Medicine | Admitting: Emergency Medicine

## 2021-02-24 DIAGNOSIS — H0289 Other specified disorders of eyelid: Secondary | ICD-10-CM | POA: Diagnosis not present

## 2021-02-24 DIAGNOSIS — Z5321 Procedure and treatment not carried out due to patient leaving prior to being seen by health care provider: Secondary | ICD-10-CM | POA: Insufficient documentation

## 2021-02-24 DIAGNOSIS — L728 Other follicular cysts of the skin and subcutaneous tissue: Secondary | ICD-10-CM | POA: Insufficient documentation

## 2021-02-24 MED ORDER — HIBICLENS 4 % EX LIQD
Freq: Every day | CUTANEOUS | 3 refills | Status: DC | PRN
  Filled 2021-02-24: qty 946, fill #0

## 2021-02-24 MED ORDER — HIBICLENS 4 % EX LIQD: Freq: Every day | CUTANEOUS | 3 refills | Status: DC | PRN

## 2021-02-24 MED ORDER — CLINDAMYCIN PHOSPHATE 1 % EX GEL
Freq: Two times a day (BID) | CUTANEOUS | 0 refills | Status: DC | PRN
  Filled 2021-02-24 – 2021-04-10 (×2): qty 60, 30d supply, fill #0

## 2021-02-24 MED ORDER — CLINDAMYCIN PHOSPHATE 1 % EX GEL: Freq: Two times a day (BID) | CUTANEOUS | 0 refills | Status: DC | PRN

## 2021-02-24 NOTE — ED Triage Notes (Signed)
Pt arrives to ED with c/o of pain and swelling to right eyelid that's started today. No vision abnormalities or redness to eye. Pt also is here due to cyst under right arm. The cyst has been inflamed this week with purulent discharge with tenderness to palpation.

## 2021-02-24 NOTE — ED Notes (Signed)
Pt called for x3. No response from [facility].

## 2021-02-25 ENCOUNTER — Encounter: Payer: Self-pay | Admitting: Nurse Practitioner

## 2021-02-26 ENCOUNTER — Encounter: Payer: Medicaid Other | Admitting: Physical Therapy

## 2021-03-03 ENCOUNTER — Other Ambulatory Visit: Payer: Self-pay

## 2021-04-10 ENCOUNTER — Other Ambulatory Visit: Payer: Self-pay

## 2021-04-10 ENCOUNTER — Encounter: Payer: Self-pay | Admitting: Nurse Practitioner

## 2021-04-11 ENCOUNTER — Other Ambulatory Visit: Payer: Self-pay

## 2021-05-14 ENCOUNTER — Other Ambulatory Visit: Payer: Self-pay

## 2021-05-14 ENCOUNTER — Other Ambulatory Visit: Payer: Self-pay | Admitting: Nurse Practitioner

## 2021-05-14 ENCOUNTER — Encounter: Payer: Self-pay | Admitting: Nurse Practitioner

## 2021-05-14 ENCOUNTER — Ambulatory Visit: Payer: PRIVATE HEALTH INSURANCE | Attending: Nurse Practitioner | Admitting: Nurse Practitioner

## 2021-05-14 VITALS — BP 99/69 | HR 74 | Ht 66.0 in | Wt 175.8 lb

## 2021-05-14 DIAGNOSIS — L732 Hidradenitis suppurativa: Secondary | ICD-10-CM

## 2021-05-14 DIAGNOSIS — Z0001 Encounter for general adult medical examination with abnormal findings: Secondary | ICD-10-CM

## 2021-05-14 DIAGNOSIS — Z0000 Encounter for general adult medical examination without abnormal findings: Secondary | ICD-10-CM

## 2021-05-14 DIAGNOSIS — J4541 Moderate persistent asthma with (acute) exacerbation: Secondary | ICD-10-CM

## 2021-05-14 MED ORDER — ALBUTEROL SULFATE HFA 108 (90 BASE) MCG/ACT IN AERS
2.0000 | INHALATION_SPRAY | RESPIRATORY_TRACT | 0 refills | Status: DC | PRN
  Filled 2021-05-14: qty 18, 16d supply, fill #0

## 2021-05-14 NOTE — Progress Notes (Signed)
Assessment & Plan:  Jenna Henry was seen today for annual exam.  Diagnoses and all orders for this visit:  Encounter for annual physical exam  Moderate persistent asthma with acute exacerbation -     albuterol (VENTOLIN HFA) 108 (90 Base) MCG/ACT inhaler; Inhale 2 puffs into the lungs every 4 (four) hours as needed for wheezing or shortness of breath.  Hidradenitis suppurativa -     Ambulatory referral to Dermatology   Patient has been counseled on age-appropriate routine health concerns for screening and prevention. These are reviewed and up-to-date. Referrals have been placed accordingly. Immunizations are up-to-date or declined.    Subjective:   Chief Complaint  Patient presents with   Annual Exam   HPI Jenna Henry 25 y.o. female presents to office today for annual physical exam. States she was getting her tongue pierced on Saturday and passed out and hit the back of her head.  When she awoke she could not remember where she was or what it happened.  Today she reports a knot on the back of her head that she is concerned about.  Denies any visual disturbances, dizziness, excessive drowsiness, current loss of memory or any changes with speech.    Review of Systems  Constitutional:  Negative for fever, malaise/fatigue and weight loss.  HENT: Negative.  Negative for nosebleeds.   Eyes: Negative.  Negative for blurred vision, double vision and photophobia.  Respiratory: Negative.  Negative for cough and shortness of breath.   Cardiovascular: Negative.  Negative for chest pain, palpitations and leg swelling.  Gastrointestinal: Negative.  Negative for heartburn, nausea and vomiting.  Genitourinary: Negative.   Musculoskeletal: Negative.  Negative for myalgias.  Skin: Negative.   Neurological: Negative.  Negative for dizziness, focal weakness, seizures and headaches.  Endo/Heme/Allergies: Negative.   Psychiatric/Behavioral: Negative.  Negative for suicidal ideas.    Past Medical  History:  Diagnosis Date   Anxiety    Asthma    Depression    Herpes    Polycystic ovaries     Past Surgical History:  Procedure Laterality Date   CYSTECTOMY      Family History  Problem Relation Age of Onset   Hypertension Mother    Anxiety disorder Mother    Heart murmur Father    Heart disease Maternal Grandfather    Diabetes Paternal Grandmother    Cancer Paternal Grandmother     Social History Reviewed with no changes to be made today.   Outpatient Medications Prior to Visit  Medication Sig Dispense Refill   chlorhexidine (HIBICLENS) 4 % external liquid Apply topically daily as needed. 946 mL 3   clindamycin (CLINDAGEL) 1 % gel Apply topically 2 (two) times daily as needed. Use only as needed. 60 g 0   ibuprofen (ADVIL) 800 MG tablet Take 1 tablet (800 mg total) by mouth 3 (three) times daily. 60 tablet 1   albuterol (VENTOLIN HFA) 108 (90 Base) MCG/ACT inhaler Inhale 2 puffs into the lungs every 4 (four) hours as needed for wheezing or shortness of breath. 18 g 0   No facility-administered medications prior to visit.    Allergies  Allergen Reactions   Ortho Tri-Cyclen [Norgestimate-Eth Estradiol] Hives, Itching, Rash and Other (See Comments)    "Burning all over"       Objective:    BP 99/69    Pulse 74    Ht 5\' 6"  (1.676 m)    Wt 175 lb 12.8 oz (79.7 kg)    SpO2 100%  BMI 28.37 kg/m  Wt Readings from Last 3 Encounters:  05/14/21 175 lb 12.8 oz (79.7 kg)  02/24/21 175 lb (79.4 kg)  02/11/21 175 lb 12.8 oz (79.7 kg)    Physical Exam Constitutional:      Appearance: She is well-developed.  HENT:     Head: Normocephalic and atraumatic.     Right Ear: External ear normal.     Left Ear: External ear normal.     Nose: Nose normal.     Mouth/Throat:     Pharynx: No oropharyngeal exudate.  Eyes:     General: No scleral icterus.       Right eye: No discharge.     Conjunctiva/sclera: Conjunctivae normal.     Pupils: Pupils are equal, round, and  reactive to light.  Neck:     Thyroid: No thyromegaly.     Trachea: No tracheal deviation.  Cardiovascular:     Rate and Rhythm: Normal rate and regular rhythm.     Heart sounds: Normal heart sounds. No murmur heard.   No friction rub.  Pulmonary:     Effort: Pulmonary effort is normal. No accessory muscle usage or respiratory distress.     Breath sounds: Normal breath sounds. No decreased breath sounds, wheezing, rhonchi or rales.  Chest:    Abdominal:     General: Bowel sounds are normal. There is no distension.     Palpations: Abdomen is soft. There is no mass.     Tenderness: There is no abdominal tenderness. There is no guarding or rebound.  Musculoskeletal:        General: No tenderness or deformity. Normal range of motion.     Cervical back: Normal range of motion and neck supple.  Lymphadenopathy:     Cervical: No cervical adenopathy.  Skin:    General: Skin is warm and dry.     Findings: No erythema.  Neurological:     Mental Status: She is alert and oriented to person, place, and time.     Cranial Nerves: No cranial nerve deficit.     Coordination: Coordination normal.     Deep Tendon Reflexes: Reflexes are normal and symmetric.  Psychiatric:        Speech: Speech normal.        Behavior: Behavior normal.        Thought Content: Thought content normal.        Judgment: Judgment normal.         Patient has been counseled extensively about nutrition and exercise as well as the importance of adherence with medications and regular follow-up. The patient was given clear instructions to go to ER or return to medical center if symptoms don't improve, worsen or new problems develop. The patient verbalized understanding.   Follow-up: Return if symptoms worsen or fail to improve.   Claiborne Rigg, FNP-BC [facility] [place], [place] [phone]   05/14/2021, 1:11 PM

## 2021-05-14 NOTE — Progress Notes (Signed)
Pt. Still concern about cyst in armpits

## 2021-05-16 ENCOUNTER — Encounter (HOSPITAL_COMMUNITY): Payer: Self-pay | Admitting: Emergency Medicine

## 2021-05-16 ENCOUNTER — Emergency Department (HOSPITAL_COMMUNITY): Payer: PRIVATE HEALTH INSURANCE

## 2021-05-16 ENCOUNTER — Emergency Department (HOSPITAL_COMMUNITY)
Admission: EM | Admit: 2021-05-16 | Discharge: 2021-05-16 | Disposition: A | Discharge status: Home / Self Care | Payer: PRIVATE HEALTH INSURANCE | Attending: Emergency Medicine | Admitting: Emergency Medicine

## 2021-05-16 ENCOUNTER — Other Ambulatory Visit: Payer: Self-pay

## 2021-05-16 DIAGNOSIS — J45909 Unspecified asthma, uncomplicated: Secondary | ICD-10-CM | POA: Insufficient documentation

## 2021-05-16 DIAGNOSIS — Z87891 Personal history of nicotine dependence: Secondary | ICD-10-CM | POA: Insufficient documentation

## 2021-05-16 DIAGNOSIS — R55 Syncope and collapse: Secondary | ICD-10-CM | POA: Insufficient documentation

## 2021-05-16 DIAGNOSIS — R519 Headache, unspecified: Secondary | ICD-10-CM | POA: Insufficient documentation

## 2021-05-16 DIAGNOSIS — N9489 Other specified conditions associated with female genital organs and menstrual cycle: Secondary | ICD-10-CM | POA: Insufficient documentation

## 2021-05-16 LAB — BASIC METABOLIC PANEL
Anion gap: 7 (ref 5–15)
BUN: 6 mg/dL (ref 6–20)
CO2: 22 mmol/L (ref 22–32)
Calcium: 10.5 mg/dL — ABNORMAL HIGH (ref 8.9–10.3)
Chloride: 108 mmol/L (ref 98–111)
Creatinine, Ser: 0.64 mg/dL (ref 0.44–1.00)
GFR, Estimated: >60 mL/min (ref >60)
Glucose, Bld: 80 mg/dL (ref 70–99)
Potassium: 4.1 mmol/L (ref 3.5–5.1)
Sodium: 137 mmol/L (ref 135–145)

## 2021-05-16 LAB — CBC
HCT: 42.4 % (ref 36.0–46.0)
Hemoglobin: 14.1 g/dL (ref 12.0–15.0)
MCH: 33.3 pg (ref 26.0–34.0)
MCHC: 33.3 g/dL (ref 30.0–36.0)
MCV: 100.2 fL — ABNORMAL HIGH (ref 80.0–100.0)
Platelets: 258 10*3/uL (ref 150–400)
RBC: 4.23 MIL/uL (ref 3.87–5.11)
RDW: 12.9 % (ref 11.5–15.5)
WBC: 5.8 10*3/uL (ref 4.0–10.5)
nRBC: 0 % (ref 0.0–0.2)

## 2021-05-16 LAB — I-STAT BETA HCG BLOOD, ED (MC, WL, AP ONLY): I-stat hCG, quantitative: <5 m[IU]/mL (ref <5)

## 2021-05-16 LAB — TROPONIN I (HIGH SENSITIVITY): Troponin I (High Sensitivity): 3 ng/L (ref <18)

## 2021-05-16 MED ORDER — DIPHENHYDRAMINE HCL 25 MG PO CAPS: 25.0000 mg | ORAL_CAPSULE | Freq: Once | ORAL | Status: DC

## 2021-05-16 MED ORDER — PROCHLORPERAZINE MALEATE 5 MG PO TABS: 10.0000 mg | ORAL_TABLET | Freq: Once | ORAL | Status: DC

## 2021-05-16 MED ORDER — IBUPROFEN 400 MG PO TABS: 600.0000 mg | ORAL_TABLET | Freq: Once | ORAL | Status: DC

## 2021-05-16 NOTE — ED Provider Notes (Addendum)
Emergency Medicine Provider Triage Evaluation Note  November Sypher , a 25 y.o. female  was evaluated in triage.  Pt complains of 2 syncopal episodes with the first being last Thursday after she got her tongue pierced and the second being yesterday while she was standing at her kitchen counter. Patient denies history of seizures or past syncopal episodes. Patient denies striking her head or recent trauma, changes to medications, changes to diet or oral intake. Patient reports medical history of herpes and states she takes acyclovir 1x/day  Review of Systems  Positive: Syncopal episodes Negative: Nausea, vomiting, diarrhea, fevers, lightheadedness, dizziness, chest pain, shortness of breath   Physical Exam  BP 101/70 (BP Location: Right Arm)    Pulse 67    Temp 98.3 F (36.8 C) (Oral)    Resp 16    SpO2 98%  Gen:   Awake, no distress   Resp:  Normal effort  MSK:   Moves extremities without difficulty  Other:    Medical Decision Making  Medically screening exam initiated at 12:06 PM.  Appropriate orders placed.  Fynlee Rowlands was informed that the remainder of the evaluation will be completed by another provider, this initial triage assessment does not replace that evaluation, and the importance of remaining in the ED until their evaluation is complete.      Al Decant, PA-C 05/16/21 1216    Horton, Clabe Seal, DO 05/22/21 1147

## 2021-05-16 NOTE — ED Triage Notes (Signed)
Patient BIB [facility] for evaluation of headache that started after hyperventilating at work earlier today. Patient alert, oriented, and in no apparent distress at this time.  EMS Vitals BP 116/72 HR 72 100% on room air CBG 171

## 2021-05-16 NOTE — ED Provider Notes (Signed)
[place] EMERGENCY DEPARTMENT Provider Note   CSN: [id number] Arrival date & time:        History Chief Complaint  Patient presents with   Headache    Jenna Henry is a 25 y.o. female with PMH anxiety, asthma, depression, previous suicide attempt with Tylenol, syncope previously evaluated by cardiology 3 years ago who presents the emergency department for evaluation of syncope and headache.  Patient states that over the last week she has had 2 episodes of syncope.  First episode of syncope last week after receiving a tongue piercing.  Today, she was at work and had a syncopal episode.  EMS found the patient to be bradycardic but heart rate trending upwards and is hovering around 60 here in the emergency department.  Denies chest pain, shortness of breath, diaphoresis, numbness, tingling, weakness or other systemic symptoms currently the emergency department.  She states prior to today's episode of syncope she had a prodrome of lightheadedness but was able to stop her symptoms prior to having full syncope.    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   Palpitations 11/09/2017   Chest pain 11/09/2017   Cigarette smoker 11/09/2017   Severe major depression without psychotic features ([place]) 02/28/2017   MDD (major depressive disorder), recurrent, severe, 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   Suicide attempt ([place]) 02/28/2017   Anxiety state 02/28/2017   Insomnia 02/28/2017   Herpes 11/12/2016    Irregular menstruation 07/06/2016    Past Surgical History:  Procedure Laterality Date   CYSTECTOMY       OB History     Gravida  0   Para  0   Term  0   Preterm  0   AB  0   Living  0      SAB  0   IAB  0   Ectopic  0   Multiple  0   Live Births  0           Family History  Problem Relation Age of Onset   Hypertension Mother    Anxiety disorder Mother    Heart murmur Father    Heart disease Maternal Grandfather    Diabetes Paternal Grandmother    Cancer Paternal Grandmother     Social History   Tobacco Use   Smoking status: Former    Packs/day: 0.50    Types: Cigarettes   Smokeless tobacco: Never   Tobacco comments:    4 cigarettes per day  Vaping Use   Vaping Use: Some days   Substances: Nicotine, Flavoring  Substance Use Topics   Alcohol use: Yes    Alcohol/week: 2.0 standard drinks    Types: 2 Cans of beer per week    Comment: occassional   Drug use: Yes    Types: Marijuana    Home Medications Prior to Admission medications   Medication Sig Start Date End Date Taking? Authorizing Provider  albuterol (VENTOLIN HFA) 108 (  90 Base) MCG/ACT inhaler Inhale 2 puffs into the lungs every 4 (four) hours as needed for wheezing or shortness of breath. 05/14/21  Yes Claiborne Rigg, NP  chlorhexidine (HIBICLENS) 4 % external liquid Apply topically daily as needed. Patient taking differently: Apply 1 application topically See admin instructions. Cleanse affected sites daily as directed 02/24/21  Yes Claiborne Rigg, NP  clindamycin (CLINDAGEL) 1 % gel Apply topically 2 (two) times daily as needed. Use only as needed. Patient taking differently: Apply 1 application topically See admin instructions. Apply daily to affected sites as directed 02/24/21  Yes Claiborne Rigg, NP  valACYclovir (VALTREX) 1000 MG tablet Take 1,000 mg by mouth See admin instructions. Take 1,000 mg by mouth two times a day for 5 days- for flares   Yes [provider]   ibuprofen (ADVIL) 800 MG tablet Take 1 tablet (800 mg total) by mouth 3 (three) times daily. Patient not taking: Reported on 05/16/2021 02/11/21   Claiborne Rigg, NP  venlafaxine XR (EFFEXOR-XR) 75 MG 24 hr capsule Take 1 capsule (75 mg total) by mouth daily with breakfast. Patient not taking: Reported on 02/22/2019 10/14/18 04/12/19  Money, Gerlene Burdock, FNP    Allergies    Ortho tri-cyclen [norgestimate-eth estradiol]  Review of Systems   Review of Systems  Constitutional:  Negative for chills and fever.  HENT:  Negative for ear pain and sore throat.   Eyes:  Negative for pain and visual disturbance.  Respiratory:  Negative for cough and shortness of breath.   Cardiovascular:  Negative for chest pain and palpitations.  Gastrointestinal:  Negative for abdominal pain and vomiting.  Genitourinary:  Negative for dysuria and hematuria.  Musculoskeletal:  Negative for arthralgias and back pain.  Skin:  Negative for color change and rash.  Neurological:  Positive for syncope and headaches. Negative for seizures.  All other systems reviewed and are negative.  Physical Exam Updated Vital Signs BP 112/72    Pulse (!) 53    Temp 98.3 F (36.8 C) (Oral)    Resp 12    LMP 05/08/2021    SpO2 100%   Physical Exam Vitals and nursing note reviewed.  Constitutional:      General: She is not in acute distress.    Appearance: She is well-developed.  HENT:     Head: Normocephalic and atraumatic.  Eyes:     Conjunctiva/sclera: Conjunctivae normal.  Cardiovascular:     Rate and Rhythm: Normal rate and regular rhythm.     Heart sounds: No murmur heard. Pulmonary:     Effort: Pulmonary effort is normal. No respiratory distress.     Breath sounds: Normal breath sounds.  Abdominal:     Palpations: Abdomen is soft.     Tenderness: There is no abdominal tenderness.  Musculoskeletal:        General: No swelling.     Cervical back: Neck supple.  Skin:    General: Skin is warm and dry.      Capillary Refill: Capillary refill takes less than 2 seconds.  Neurological:     Mental Status: She is alert.     Cranial Nerves: No cranial nerve deficit.     Sensory: No sensory deficit.     Motor: No weakness.  Psychiatric:        Mood and Affect: Mood normal.    ED Results / Procedures / Treatments   Labs (all labs ordered are listed, but only abnormal results are displayed) Labs Reviewed  BASIC METABOLIC PANEL - Abnormal; Notable for the following components:      Result Value   Calcium 10.5 (*)    All other components within normal limits  CBC - Abnormal; Notable for the following components:   MCV 100.2 (*)    All other components within normal limits  I-STAT BETA HCG BLOOD, ED ([facility], [facility], [facility] ONLY)  TROPONIN I (HIGH SENSITIVITY)  TROPONIN I (HIGH SENSITIVITY)    EKG None  Radiology DG Chest 2 View  Result Date: 05/16/2021 CLINICAL DATA:  Chest pain, syncope EXAM: CHEST - 2 VIEW COMPARISON:  12/03/2019 FINDINGS: The heart size and mediastinal contours are within normal limits. Both lungs are clear. The visualized skeletal structures are unremarkable. IMPRESSION: No active cardiopulmonary disease. Electronically Signed   By: Elmer Picker M.D.   On: 05/16/2021 12:32    Procedures Procedures   Medications Ordered in ED Medications  ibuprofen (ADVIL) tablet 600 mg (600 mg Oral Not Given 05/16/21 1408)    ED Course  I have reviewed the triage vital signs and the nursing notes.  Pertinent labs & imaging results that were available during my care of the patient were reviewed by me and considered in my medical decision making (see chart for details).    MDM Rules/Calculators/A&P                          Patient seen emergency department for evaluation of syncope and presyncope.  Physical exam is unremarkable today.  Laboratory evaluation is unremarkable.  ECG with normal sinus rhythm with sinus arrhythmia.  Patient was previously evaluated by cardiology with  negative stress test and negative echo.  Suspect likely vasovagal syncope and associated bradycardia in the setting of sinus arrhythmia.  Patient would benefit from Holter monitoring and outpatient cardiology follow-up with possible tilt table test.  Patient has no neurologic deficits and I have very low suspicion for intracranial pathology as a source of her syncope.  She has no chest pain or shortness of breath and I am also not concerned about PE.  Patient is PERC negative.  Patient then discharged with outpatient cardiology follow-up.   Final Clinical Impression(s) / ED Diagnoses Final diagnoses:  Syncope, unspecified syncope type  Nonintractable headache, unspecified chronicity pattern, unspecified headache type    Rx / DC Orders ED Discharge Orders     None        Mahagony Grieb, Debe Coder, MD 05/16/21 [phone]

## 2021-05-23 ENCOUNTER — Other Ambulatory Visit: Payer: Self-pay

## 2021-06-03 ENCOUNTER — Ambulatory Visit: Payer: PRIVATE HEALTH INSURANCE | Admitting: Physician Assistant

## 2021-06-03 ENCOUNTER — Other Ambulatory Visit: Payer: Self-pay

## 2021-06-03 VITALS — BP 98/71 | HR 84 | Temp 98.2°F | Resp 18 | Ht 66.0 in | Wt 180.0 lb

## 2021-06-03 DIAGNOSIS — G4452 New daily persistent headache (NDPH): Secondary | ICD-10-CM | POA: Insufficient documentation

## 2021-06-03 DIAGNOSIS — S0990XS Unspecified injury of head, sequela: Secondary | ICD-10-CM | POA: Diagnosis not present

## 2021-06-03 DIAGNOSIS — R42 Dizziness and giddiness: Secondary | ICD-10-CM | POA: Insufficient documentation

## 2021-06-03 DIAGNOSIS — Z3202 Encounter for pregnancy test, result negative: Secondary | ICD-10-CM

## 2021-06-03 LAB — POCT URINE PREGNANCY: Preg Test, Ur: NEGATIVE

## 2021-06-03 NOTE — Patient Instructions (Signed)
To help get rid of your migraine, I encourage you to take 400 mg of ibuprofen along with drinking some caffeine such as coffee or Coca-Cola.  I encourage you to increase your water intake, you should drink at least 64 ounces of water every day, work on improving your sleep schedule and work on reducing your stress levels.  We will call you with the results of your imaging and labs.  Roney Jaffe, PA-C Physician Assistant [facility] [email or web address]   Migraine Headache A migraine headache is an intense, throbbing pain on one side or both sides of the head. Migraine headaches may also cause other symptoms, such as nausea, vomiting, and sensitivity to light and noise. A migraine headache can last from 4 hours to 3 days. Talk with your doctor about what things may bring on (trigger) your migraine headaches. What are the causes? The exact cause of this condition is not known. However, a migraine may be caused when nerves in the brain become irritated and release chemicals that cause inflammation of blood vessels. This inflammation causes pain. This condition may be triggered or caused by: Drinking alcohol. Smoking. Taking medicines, such as: Medicine used to treat chest pain (nitroglycerin). Birth control pills. Estrogen. Certain blood pressure medicines. Eating or drinking products that contain nitrates, glutamate, aspartame, or tyramine. Aged cheeses, chocolate, or caffeine may also be triggers. Doing physical activity. Other things that may trigger a migraine headache include: Menstruation. Pregnancy. Hunger. Stress. Lack of sleep or too much sleep. Weather changes. Fatigue. What increases the risk? The following factors may make you more likely to experience migraine headaches: Being a certain age. This condition is more common in people who are 43-46 years old. Being female. Having a family history of migraine  headaches. Being Caucasian. Having a mental health condition, such as depression or anxiety. Being obese. What are the signs or symptoms? The main symptom of this condition is pulsating or throbbing pain. This pain may: Happen in any area of the head, such as on one side or both sides. Interfere with daily activities. Get worse with physical activity. Get worse with exposure to bright lights or loud noises. Other symptoms may include: Nausea. Vomiting. Dizziness. General sensitivity to bright lights, loud noises, or smells. Before you get a migraine headache, you may get warning signs (an aura). An aura may include: Seeing flashing lights or having blind spots. Seeing bright spots, halos, or zigzag lines. Having tunnel vision or blurred vision. Having numbness or a tingling feeling. Having trouble talking. Having muscle weakness. Some people have symptoms after a migraine headache (postdromal phase), such as: Feeling tired. Difficulty concentrating. How is this diagnosed? A migraine headache can be diagnosed based on: Your symptoms. A physical exam. Tests, such as: CT scan or an MRI of the head. These imaging tests can help rule out other causes of headaches. Taking fluid from the spine (lumbar puncture) and analyzing it (cerebrospinal fluid analysis, or CSF analysis). How is this treated? This condition may be treated with medicines that: Relieve pain. Relieve nausea. Prevent migraine headaches. Treatment for this condition may also include: Acupuncture. Lifestyle changes like avoiding foods that trigger migraine headaches. Biofeedback. Cognitive behavioral therapy. Follow these instructions at home: Medicines Take over-the-counter and prescription medicines only as told by your health care provider. Ask your health care provider if the medicine prescribed to you: Requires you to avoid driving or using heavy machinery. Can cause constipation. You may need to take these  actions to prevent  or treat constipation: Drink enough fluid to keep your urine pale yellow. Take over-the-counter or prescription medicines. Eat foods that are high in fiber, such as beans, whole grains, and fresh fruits and vegetables. Limit foods that are high in fat and processed sugars, such as fried or sweet foods. Lifestyle Do not drink alcohol. Do not use any products that contain nicotine or tobacco, such as cigarettes, e-cigarettes, and chewing tobacco. If you need help quitting, ask your health care provider. Get at least 8 hours of sleep every night. Find ways to manage stress, such as meditation, deep breathing, or yoga. General instructions   Keep a journal to find out what may trigger your migraine headaches. For example, write down: What you eat and drink. How much sleep you get. Any change to your diet or medicines. If you have a migraine headache: Avoid things that make your symptoms worse, such as bright lights. It may help to lie down in a dark, quiet room. Do not drive or use heavy machinery. Ask your health care provider what activities are safe for you while you are experiencing symptoms. Keep all follow-up visits as told by your health care provider. This is important. Contact a health care provider if: You develop symptoms that are different or more severe than your usual migraine headache symptoms. You have more than 15 headache days in one month. Get help right away if: Your migraine headache becomes severe. Your migraine headache lasts longer than 72 hours. You have a fever. You have a stiff neck. You have vision loss. Your muscles feel weak or like you cannot control them. You start to lose your balance often. You have trouble walking. You faint. You have a seizure. Summary A migraine headache is an intense, throbbing pain on one side or both sides of the head. Migraines may also cause other symptoms, such as nausea, vomiting, and sensitivity to light  and noise. This condition may be treated with medicines and lifestyle changes. You may also need to avoid certain things that trigger a migraine headache. Keep a journal to find out what may trigger your migraine headaches. Contact your health care provider if you have more than 15 headache days in a month or you develop symptoms that are different or more severe than your usual migraine headache symptoms. This information is not intended to replace advice given to you by your health care provider. Make sure you discuss any questions you have with your health care provider. Document Revised: 09/09/2018 Document Reviewed: 06/30/2018 Elsevier Patient Education  2022 [employer].

## 2021-06-03 NOTE — Progress Notes (Signed)
Patient reports HA since christmas being consistent daily. This morning HA was present and right eye was close. Patient has not eaten today and patient has not taken medication .

## 2021-06-03 NOTE — Progress Notes (Signed)
Established Patient Office Visit  Subjective:  Patient ID: Jenna Henry, female    DOB: March 14, 1996  Age: 26 y.o. MRN: [id number]  CC:  Chief Complaint  Patient presents with   Hospitalization Follow-up    migraine    HPI Jenna Henry states that she has been experiencing headaches, dizziness, feelings of passing out, feeling of being off balance and an abnormal sleep pattern since hitting her head after passing out when she got her tongue pierced.  States that she has had a persistent right sided frontal headache since 05/25/21 and noticed she started having some swelling over her right eye in the last day too.  She also is experiencing photophobia.  States that she has not tried anything for relief.  States that she does not drink much water but drinks approx 3 bottles of propel and 3 bottles of zero gatorade a day. Does not drink much caffeine.  States that her stress level is a little elevated as well.   States that she has not had a regular sleep schedule, will sleep more during the day one day and then not sleep as much the next day.  Has not tried anything for relief .   Past Medical History:  Diagnosis Date   Anxiety    Asthma    Depression    Herpes    Polycystic ovaries     Past Surgical History:  Procedure Laterality Date   CYSTECTOMY      Family History  Problem Relation Age of Onset   Hypertension Mother    Anxiety disorder Mother    Heart murmur Father    Heart disease Maternal Grandfather    Diabetes Paternal Grandmother    Cancer Paternal Grandmother     Social History   Socioeconomic History   Marital status: Single    Spouse name: Not on file   Number of children: Not on file   Years of education: Not on file   Highest education level: Not on file  Occupational History   Not on file  Tobacco Use   Smoking status: Former    Packs/day: 0.50    Types: Cigarettes   Smokeless tobacco: Never   Tobacco comments:    4 cigarettes per day  Vaping  Use   Vaping Use: Some days   Substances: Nicotine, Flavoring  Substance and Sexual Activity   Alcohol use: Yes    Alcohol/week: 2.0 standard drinks    Types: 2 Cans of beer per week    Comment: occassional   Drug use: Yes    Types: Marijuana   Sexual activity: Yes    Birth control/protection: None  Other Topics Concern   Not on file  Social History Narrative   Not on file   Social Determinants of Health   Financial Resource Strain: Not on file  Food Insecurity: Not on file  Transportation Needs: Not on file  Physical Activity: Not on file  Stress: Not on file  Social Connections: Not on file  Intimate Partner Violence: Not on file    Outpatient Medications Prior to Visit  Medication Sig Dispense Refill   albuterol (VENTOLIN HFA) 108 (90 Base) MCG/ACT inhaler Inhale 2 puffs into the lungs every 4 (four) hours as needed for wheezing or shortness of breath. 18 g 0   chlorhexidine (HIBICLENS) 4 % external liquid Apply topically daily as needed. (Patient taking differently: Apply 1 application topically See admin instructions. Cleanse affected sites daily as directed) 946 mL 3   ibuprofen (  ADVIL) 800 MG tablet Take 1 tablet (800 mg total) by mouth 3 (three) times daily. 60 tablet 1   valACYclovir (VALTREX) 1000 MG tablet Take 1,000 mg by mouth See admin instructions. Take 1,000 mg by mouth two times a day for 5 days- for flares     clindamycin (CLINDAGEL) 1 % gel Apply topically 2 (two) times daily as needed. Use only as needed. (Patient taking differently: Apply 1 application topically See admin instructions. Apply daily to affected sites as directed) 60 g 0   No facility-administered medications prior to visit.    Allergies  Allergen Reactions   Ortho Tri-Cyclen [Norgestimate-Eth Estradiol] Hives, Itching, Rash and Other (See Comments)    "Caused burning all over"    ROS Review of Systems  Constitutional:  Negative for chills and fever.  HENT: Negative.    Eyes:   Positive for photophobia. Negative for visual disturbance.  Respiratory:  Negative for cough and shortness of breath.   Cardiovascular:  Negative for chest pain.  Gastrointestinal:  Negative for abdominal pain, nausea and vomiting.  Endocrine: Negative.   Genitourinary: Negative.   Musculoskeletal: Negative.   Skin: Negative.   Allergic/Immunologic: Negative.   Neurological:  Positive for dizziness and headaches. Negative for syncope, speech difficulty and weakness.  Hematological: Negative.   Psychiatric/Behavioral:  Positive for sleep disturbance. Negative for self-injury and suicidal ideas.      Objective:    Physical Exam Vitals and nursing note reviewed.  Constitutional:      General: She is not in acute distress.    Appearance: Normal appearance. She is not ill-appearing.  HENT:     Head: Normocephalic and atraumatic.     Nose: Nose normal.     Mouth/Throat:     Mouth: Mucous membranes are moist.     Pharynx: Oropharynx is clear.  Eyes:     Extraocular Movements: Extraocular movements intact.     Conjunctiva/sclera: Conjunctivae normal.     Pupils: Pupils are equal, round, and reactive to light.  Cardiovascular:     Rate and Rhythm: Normal rate and regular rhythm.     Pulses: Normal pulses.     Heart sounds: Normal heart sounds.  Pulmonary:     Effort: Pulmonary effort is normal.     Breath sounds: Normal breath sounds.  Musculoskeletal:        General: Normal range of motion.     Cervical back: Normal range of motion and neck supple.  Skin:    General: Skin is warm and dry.  Neurological:     General: No focal deficit present.     Mental Status: She is alert and oriented to person, place, and time.     Cranial Nerves: No cranial nerve deficit.     Sensory: No sensory deficit.  Psychiatric:        Mood and Affect: Mood normal.        Behavior: Behavior normal.        Thought Content: Thought content normal.        Judgment: Judgment normal.    BP 98/71 (BP  Location: Right Arm, Patient Position: Sitting, Cuff Size: Normal)    Pulse 84    Temp 98.2 F (36.8 C) (Oral)    Resp 18    Ht 5\' 6"  (1.676 m)    Wt 180 lb (81.6 kg)    LMP 05/08/2021    SpO2 98%    BMI 29.05 kg/m  Wt Readings from Last 3 Encounters:  06/03/21  180 lb (81.6 kg)  05/14/21 175 lb 12.8 oz (79.7 kg)  02/24/21 175 lb (79.4 kg)     Health Maintenance Due  Topic Date Due   COVID-19 Vaccine (1) Never done   Pneumococcal Vaccine 50-72 Years old (1 - PCV) Never done   HPV VACCINES (1 - 2-dose series) Never done   Hepatitis C Screening  Never done   TETANUS/TDAP  Never done   INFLUENZA VACCINE  Never done       Topic Date Due   HPV VACCINES (1 - 2-dose series) Never done    Lab Results  Component Value Date   TSH 1.586 10/11/2018   Lab Results  Component Value Date   WBC 5.8 05/16/2021   HGB 14.1 05/16/2021   HCT 42.4 05/16/2021   MCV 100.2 (H) 05/16/2021   PLT 258 05/16/2021   Lab Results  Component Value Date   NA 137 05/16/2021   K 4.1 05/16/2021   CO2 22 05/16/2021   GLUCOSE 80 05/16/2021   BUN 6 05/16/2021   CREATININE 0.64 05/16/2021   BILITOT 0.4 11/08/2020   ALKPHOS 51 11/08/2020   AST 22 11/08/2020   ALT 22 11/08/2020   PROT 7.5 11/08/2020   ALBUMIN 4.0 11/08/2020   CALCIUM 10.5 (H) 05/16/2021   ANIONGAP 7 05/16/2021   Lab Results  Component Value Date   CHOL 147 10/11/2018   Lab Results  Component Value Date   HDL 30 (L) 10/11/2018   Lab Results  Component Value Date   LDLCALC 96 10/11/2018   Lab Results  Component Value Date   TRIG 106 10/11/2018   Lab Results  Component Value Date   CHOLHDL 4.9 10/11/2018   Lab Results  Component Value Date   HGBA1C 5.0 10/11/2018      Assessment & Plan:   Problem List Items Addressed This Visit   None Visit Diagnoses     Head trauma, sequela    -  Primary   Relevant Orders   CT HEAD WO CONTRAST (5MM)   Comp. Metabolic Panel (12)   New daily persistent headache       Relevant  Orders   Vitamin D, 25-hydroxy   TSH   POCT urine pregnancy   Dizziness and giddiness           No orders of the defined types were placed in this encounter. 1. Head trauma, sequela Patient education given on supportive care, red flags for prompt reevaluation  - CT HEAD WO CONTRAST (5MM); Future - Comp. Metabolic Panel (12)  2. New daily persistent headache Trial ibuprofen OTC, patient education given on improving sleep hygiene, increasing water intake  - Vitamin D, 25-hydroxy - TSH - POCT urine pregnancy  3. Dizziness and giddiness   4. Encounter for pregnancy test with result negative  - POCT urine pregnancy    I have reviewed the patient's medical history (PMH, PSH, Social History, Family History, Medications, and allergies) , and have been updated if relevant. I spent 30 minutes reviewing chart and  face to face time with patient.    Follow-up: Return if symptoms worsen or fail to improve.    Loraine Grip Mayers, PA-C

## 2021-06-08 ENCOUNTER — Other Ambulatory Visit: Payer: Self-pay

## 2021-06-08 ENCOUNTER — Encounter (HOSPITAL_BASED_OUTPATIENT_CLINIC_OR_DEPARTMENT_OTHER): Payer: Self-pay | Admitting: Obstetrics and Gynecology

## 2021-06-08 ENCOUNTER — Emergency Department (HOSPITAL_BASED_OUTPATIENT_CLINIC_OR_DEPARTMENT_OTHER): Payer: Worker's Compensation

## 2021-06-08 ENCOUNTER — Emergency Department (HOSPITAL_BASED_OUTPATIENT_CLINIC_OR_DEPARTMENT_OTHER)
Admission: EM | Admit: 2021-06-08 | Discharge: 2021-06-08 | Disposition: A | Discharge status: Home / Self Care | Payer: Worker's Compensation | Attending: Emergency Medicine | Admitting: Emergency Medicine

## 2021-06-08 DIAGNOSIS — W19XXXA Unspecified fall, initial encounter: Secondary | ICD-10-CM

## 2021-06-08 DIAGNOSIS — W010XXA Fall on same level from slipping, tripping and stumbling without subsequent striking against object, initial encounter: Secondary | ICD-10-CM | POA: Diagnosis not present

## 2021-06-08 DIAGNOSIS — S99911A Unspecified injury of right ankle, initial encounter: Secondary | ICD-10-CM | POA: Diagnosis present

## 2021-06-08 DIAGNOSIS — S93401A Sprain of unspecified ligament of right ankle, initial encounter: Secondary | ICD-10-CM | POA: Insufficient documentation

## 2021-06-08 MED ORDER — OXYCODONE-ACETAMINOPHEN 5-325 MG PO TABS
2.0000 | ORAL_TABLET | Freq: Once | ORAL | Status: AC
  Administered 2021-06-08: 2 via ORAL
  Filled 2021-06-08: qty 2

## 2021-06-08 NOTE — ED Provider Notes (Signed)
[facility]   CSN: [id number] Arrival date & time: 06/08/21  2101     History  Chief Complaint  Patient presents with   Joycelyn RuaFall    Hadley Colocho is a 26 y.o. female.  HPI 26 year old female presents today complaining of right ankle swelling and pain.  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  She fell in some water.  She has an injury to the right ankle.  She denies striking her head, neck pain, or trunk injury.  She denies arm or left lower extremity injury.  She continues to make deliveries and came in here secondary to pain and swelling.    Home Medications Prior to Admission medications   Medication Sig Start Date End Date Taking? Authorizing Provider  albuterol (VENTOLIN HFA) 108 (90 Base) MCG/ACT inhaler Inhale 2 puffs into the lungs every 4 (four) hours as needed for wheezing or shortness of breath. 05/14/21   Claiborne RiggFleming, Zelda W, NP  chlorhexidine (HIBICLENS) 4 % external liquid Apply topically daily as needed. Patient taking differently: Apply 1 application topically See admin instructions. Cleanse affected sites daily as directed 02/24/21   Claiborne RiggFleming, Zelda W, NP  clindamycin (CLINDAGEL) 1 % gel Apply topically 2 (two) times daily as needed. Use only as needed. Patient taking differently: Apply 1 application topically See admin instructions. Apply daily to affected sites as directed 02/24/21   Claiborne RiggFleming, Zelda W, NP  ibuprofen (ADVIL) 800 MG tablet Take 1 tablet (800 mg total) by mouth 3 (three) times daily. 02/11/21   Claiborne RiggFleming, Zelda W, NP  valACYclovir (VALTREX) 1000 MG tablet Take 1,000 mg by mouth See admin instructions. Take 1,000 mg by mouth two times a day for 5 days- for flares    [provider]      Allergies    Ortho tri-cyclen [norgestimate-eth estradiol]    Review of Systems   Review of Systems  All other systems reviewed and are negative.  Physical Exam Updated Vital Signs BP  101/68    Pulse 60    Temp 98.3 F (36.8 C) (Oral)    Resp 16    Ht 1.676 m (5\' 6" )    Wt 81.6 kg    LMP 06/08/2021 (Exact Date)    SpO2 99%    BMI 29.05 kg/m  Physical Exam Vitals and nursing note reviewed.  Constitutional:      General: She is not in acute distress.    Appearance: She is well-developed.  HENT:     Head: Normocephalic and atraumatic.     Right Ear: External ear normal.     Left Ear: External ear normal.     Nose: Nose normal.  Eyes:     Conjunctiva/sclera: Conjunctivae normal.     Pupils: Pupils are equal, round, and reactive to light.  Pulmonary:     Effort: Pulmonary effort is normal.  Musculoskeletal:        General: Swelling and tenderness present. Normal range of motion.     Cervical back: Normal range of motion and neck supple.     Comments: Right ankle with lateral swelling and tenderness to palpation Pulses are intact She is neurovascular intact There is mild proximal tenderness over the mid tib-fib area Pelvis is stable and there is no hip pain noted Thoracic, lumbar spine and cervical spine all palpated without injury noted  Skin:    General: Skin is warm and dry.  Neurological:  Mental Status: She is alert and oriented to person, place, and time.     Motor: No abnormal muscle tone.     Coordination: Coordination normal.  Psychiatric:        Behavior: Behavior normal.        Thought Content: Thought content normal.    ED Results / Procedures / Treatments   Labs (all labs ordered are listed, but only abnormal results are displayed) Labs Reviewed - No data to display  EKG None  Radiology DG Tibia/Fibula Right  Result Date: 06/08/2021 CLINICAL DATA:  Fall, pain EXAM: RIGHT TIBIA AND FIBULA - 2 VIEW COMPARISON:  None. FINDINGS: Lateral soft tissue swelling at the ankle. Joint spaces maintained in the knee and ankle. No acute bony abnormality. Specifically, no fracture, subluxation, or dislocation. IMPRESSION: No acute bony abnormality.  Electronically Signed   By: Rolm Baptise M.D.   On: 06/08/2021 22:01   DG Ankle Complete Right  Result Date: 06/08/2021 CLINICAL DATA:  Fall, pain EXAM: RIGHT ANKLE - COMPLETE 3+ VIEW COMPARISON:  06/08/2021 FINDINGS: Lateral soft tissue swelling. No acute bony abnormality. Specifically, no fracture, subluxation, or dislocation. Joint spaces maintained. IMPRESSION: No acute bony abnormality. Electronically Signed   By: Rolm Baptise M.D.   On: 06/08/2021 22:01    Procedures Procedures    Medications Ordered in ED Medications  oxyCODONE-acetaminophen (PERCOCET/ROXICET) 5-325 MG per tablet 2 tablet (2 tablets Oral Given 06/08/21 2202)    ED Course/ Medical Decision Making/ A&P Clinical Course as of 06/08/21 2209  Sun Jun 08, 2021  2159 Right ankle x-Dayshaun Whobrey personally Reviewed and interpreted with no evidence of acute fracture or mortise widening noted [DR]    Clinical Course User Index [DR] Pattricia Boss, MD                           Medical Decision Making  26 year old female presents today complaining of right ankle pain after trip and fall.  She does have swelling noted. No evidence of fracture on x-Teddi Badalamenti. Patient placed in Ace wrap and given crutches.  She is advised regarding ambulating and follow-up.  She voices understanding.        Final Clinical Impression(s) / ED Diagnoses Final diagnoses:  Fall, initial encounter  Sprain of right ankle, unspecified ligament, initial encounter    Rx / DC Orders ED Discharge Orders     None         Pattricia Boss, MD 06/08/21 2209

## 2021-06-08 NOTE — ED Triage Notes (Signed)
Patient reports to the ER for a fall. Patient reports she fell on someone's porch. Patient reports she hurt her right ankle/foot.

## 2021-06-08 NOTE — Discharge Instructions (Addendum)
Please bear weight only as tolerated Use crutches until pain has improved Recheck with orthopedist Use ibuprofen and acetaminophen as needed for pain.

## 2021-06-09 ENCOUNTER — Ambulatory Visit (HOSPITAL_COMMUNITY)
Admission: RE | Admit: 2021-06-09 | Discharge: 2021-06-09 | Disposition: A | Discharge status: Home / Self Care | Payer: Self-pay | Source: Ambulatory Visit | Attending: Physician Assistant | Admitting: Physician Assistant

## 2021-06-09 DIAGNOSIS — S0990XS Unspecified injury of head, sequela: Secondary | ICD-10-CM | POA: Insufficient documentation

## 2021-06-10 ENCOUNTER — Telehealth: Payer: Self-pay | Admitting: *Deleted

## 2021-06-10 ENCOUNTER — Ambulatory Visit (INDEPENDENT_AMBULATORY_CARE_PROVIDER_SITE_OTHER): Payer: Worker's Compensation | Admitting: Surgical

## 2021-06-10 ENCOUNTER — Other Ambulatory Visit: Payer: Self-pay

## 2021-06-10 DIAGNOSIS — S93401A Sprain of unspecified ligament of right ankle, initial encounter: Secondary | ICD-10-CM | POA: Diagnosis not present

## 2021-06-10 MED ORDER — HYDROCODONE-ACETAMINOPHEN 5-325 MG PO TABS
1.0000 | ORAL_TABLET | Freq: Three times a day (TID) | ORAL | 0 refills | Status: DC | PRN
Start: 2021-06-10 — End: 2021-10-22

## 2021-06-10 MED ORDER — METHOCARBAMOL 500 MG PO TABS: 500.0000 mg | ORAL_TABLET | Freq: Three times a day (TID) | ORAL | 0 refills | Status: DC | PRN

## 2021-06-10 MED ORDER — CELECOXIB 100 MG PO CAPS: 100.0000 mg | ORAL_CAPSULE | Freq: Two times a day (BID) | ORAL | 0 refills | Status: DC

## 2021-06-10 NOTE — Telephone Encounter (Signed)
Patient verified DOB Patient viewed results via mychart and had no questions.

## 2021-06-10 NOTE — Telephone Encounter (Signed)
-----   Message from Kennieth Rad, [place] sent at 06/10/2021  8:27 AM EST ----- Please call patient and let her know that the CT of her head did not show any abnormalities, no acute changes.

## 2021-06-10 NOTE — Progress Notes (Signed)
Office Visit Note   Patient: Jenna Henry           Date of Birth: Oct 17, 1995           MRN: [id number] Visit Date: 06/10/2021 Requested by: Gildardo Pounds, NP [place],  [place] [id number] PCP: Gildardo Pounds, NP  Subjective: Chief Complaint  Patient presents with   Right Ankle - Pain    HPI: Jenna Henry is a 26 y.o. female who presents to the office complaining of right ankle pain.  Patient has history of right ankle inversion injury on Sunday.  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  She states she landed with her foot behind her and her foot inverted.  She was able to limp back to her truck and complete a few more deliveries before the pain became so severe that she had to call out.  She has now gotten to the point where she is able to weight-bear only with crutches.  She was seen in the emergency department where she had radiographs that demonstrated no fracture.  She has no history of prior ankle injury or surgery.  No significant medical history according to her; chart review was performed on her problem list.  No family or personal history of DVT/PE.  She also drives for [employer].  Other physical things she does in her free time mostly includes walking.  She does not have any brace currently..                ROS: All systems reviewed are negative as they relate to the chief complaint within the history of present illness.  Patient denies fevers or chills.  Assessment & Plan: Visit Diagnoses:  1. Moderate right ankle sprain, initial encounter     Plan: Patient is a 26 year old female who presents for evaluation of right ankle pain.  She had ankle inversion injury with immediate swelling on Sunday.  Radiographs are negative for fracture.  These were reviewed with the patient today.  No talar dome lesion, lateral process of the talus, anterior process of the calcaneus fracture either.  She is having difficulty  weightbearing so plan to have her wear a walker boot for relief.  She may get around with crutches as she needs to.  Work note provided keeping her out of work for 2 weeks and then we will reassess her in 10 days with likely transition to sports ankle brace at that time.  Reassess return to work at that time as well.  Prescribe a one-time prescription of Norco as well as Celebrex and Robaxin.  Follow-up in 10 days.  Follow-Up Instructions: No follow-ups on file.   Orders:  No orders of the defined types were placed in this encounter.  Meds ordered this encounter  Medications   celecoxib (CELEBREX) 100 MG capsule    Sig: Take 1 capsule (100 mg total) by mouth 2 (two) times daily. Do not take with Ibuprofen    Dispense:  60 capsule    Refill:  0   methocarbamol (ROBAXIN) 500 MG tablet    Sig: Take 1 tablet (500 mg total) by mouth every 8 (eight) hours as needed.    Dispense:  30 tablet    Refill:  0   HYDROcodone-acetaminophen (NORCO/VICODIN) 5-325 MG tablet    Sig: Take 1 tablet by mouth every 8 (eight) hours as needed for moderate pain.    Dispense:  20 tablet  Refill:  0      Procedures: No procedures performed   Clinical Data: No additional findings.  Objective: Vital Signs: LMP 06/08/2021 (Exact Date)   Physical Exam:  Constitutional: Patient appears well-developed HEENT:  Head: Normocephalic Eyes:EOM are normal Neck: Normal range of motion Cardiovascular: Normal rate Pulmonary/chest: Effort normal Neurologic: Patient is alert Skin: Skin is warm Psychiatric: Patient has normal mood and affect  Ortho Exam: Ortho exam demonstrates right ankle with significant soft tissue swelling over the lateral aspect near the lateral malleolus.  No bruising or ecchymosis noted over the ankle or the plantar aspect of the foot.  Intact anterior tibialis tendon and Achilles tendon by palpation.  Intact ankle dorsiflexion and plantarflexion though this is limited due to patient's pain.   Intact ankle inversion/eversion though again this is limited due to her pain.  No tenderness over the medial malleolus or deltoid ligament.  No tenderness over the Lisfranc complex.  No pain with stressing the Lisfranc complex.  No calf tenderness.  Negative Homans' sign.  Mild tenderness over the lateral malleolus with moderate tenderness over the ATFL and CFL.  No tenderness or swelling noted over the Achilles tendon.  Specialty Comments:  No specialty comments available.  Imaging: CT HEAD WO CONTRAST ( )  Result Date: 06/09/2021 CLINICAL DATA:  Headaches EXAM: CT HEAD WITHOUT CONTRAST TECHNIQUE: Contiguous axial images were obtained from the base of the skull through the vertex without intravenous contrast. COMPARISON:  06/28/2019 FINDINGS: Brain: No evidence of acute infarction, hemorrhage, cerebral edema, mass, mass effect, or midline shift. No hydrocephalus or extra-axial fluid collection. Vascular: No hyperdense vessel. Skull: Normal. Negative for fracture or focal lesion. Sinuses/Orbits: No acute finding. Other: The mastoid air cells are well aerated. IMPRESSION: IMPRESSION No acute intracranial process. Electronically Signed   By: Wiliam Ke M.D.   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   Palpitations 11/09/2017   Chest pain 11/09/2017   Cigarette smoker 11/09/2017   Severe major depression without psychotic features (HCC) 02/28/2017   MDD (major depressive disorder), recurrent, severe, 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   Suicide attempt (HCC) 02/28/2017   Anxiety state 02/28/2017   Insomnia 02/28/2017   Herpes 11/12/2016    Irregular menstruation 07/06/2016   Past Medical History:  Diagnosis Date   Anxiety    Asthma    Depression    Herpes    Polycystic ovaries     Family History  Problem Relation Age of Onset   Hypertension Mother    Anxiety disorder Mother    Heart murmur Father    Heart disease Maternal Grandfather    Diabetes Paternal Grandmother    Cancer Paternal Grandmother     Past Surgical History:  Procedure Laterality Date   CYSTECTOMY     Social History   Occupational History   Not on file  Tobacco Use   Smoking status: Former    Packs/day: 0.50    Types: Cigarettes   Smokeless tobacco: Never   Tobacco comments:    4 cigarettes per day  Vaping Use   Vaping Use: Every day   Substances: Nicotine, Flavoring  Substance and Sexual Activity  Alcohol use: Yes    Alcohol/week: 2.0 standard drinks    Types: 2 Cans of beer per week    Comment: occassional   Drug use: Yes    Types: Marijuana   Sexual activity: Yes    Birth control/protection: None

## 2021-06-25 ENCOUNTER — Encounter: Payer: Self-pay | Admitting: Orthopedic Surgery

## 2021-06-25 ENCOUNTER — Ambulatory Visit (INDEPENDENT_AMBULATORY_CARE_PROVIDER_SITE_OTHER): Payer: Worker's Compensation | Admitting: Orthopedic Surgery

## 2021-06-25 ENCOUNTER — Other Ambulatory Visit: Payer: Self-pay

## 2021-06-25 DIAGNOSIS — S93401A Sprain of unspecified ligament of right ankle, initial encounter: Secondary | ICD-10-CM

## 2021-06-25 MED ORDER — TRAMADOL HCL 50 MG PO TABS: 50.0000 mg | ORAL_TABLET | Freq: Three times a day (TID) | ORAL | 0 refills | Status: DC | PRN

## 2021-06-25 NOTE — Progress Notes (Signed)
Office Visit Note   Patient: Jenna Henry           Date of Birth: 04-20-1996           MRN: [id number] Visit Date: 06/25/2021 Requested by: Gildardo Pounds, NP [place],  [place] [address] PCP: Gildardo Pounds, NP  Subjective: Chief Complaint  Patient presents with   Right Ankle - Pain, Follow-up    HPI: Jenna Henry is a 26 year old patient who sustained a right ankle inversion injury 06/08/2021.  Outside radiographs reviewed negative for fracture.  Still is having some lateral sided pain primarily on and around the lateral malleolus.  She is able to walk around in a fracture boot.  She does work at [employer].  She uses crutches when she is not in the fracture boot.              ROS: All systems reviewed are negative as they relate to the chief complaint within the history of present illness.  Patient denies  fevers or chills.   Assessment & Plan: Visit Diagnoses:  1. Moderate right ankle sprain, initial encounter     Plan: Impression is right ankle sprain.  Plan is out of work for 4 more weeks.  3-week return for clinical recheck.  Physical therapy upstairs 1-2 times a week for 3 weeks for range of motion and strengthening.  New Ace wrap.  No evidence of significant tendon damage.  Anticipate some improvement in 3 weeks.  Follow-Up Instructions: Return in about 3 weeks (around 07/16/2021).   Orders:  Orders Placed This Encounter  Procedures   Ambulatory referral to Physical Therapy   Meds ordered this encounter  Medications   traMADol (ULTRAM) 50 MG tablet    Sig: Take 1 tablet (50 mg total) by mouth every 8 (eight) hours as needed.    Dispense:  30 tablet    Refill:  0      Procedures: No procedures performed   Clinical Data: No additional findings.  Objective: Vital Signs: LMP 06/08/2021 (Exact Date)   Physical Exam:   Constitutional: Patient appears well-developed HEENT:  Head: Normocephalic Eyes:EOM are normal Neck: Normal range of  motion Cardiovascular: Normal rate Pulmonary/chest: Effort normal Neurologic: Patient is alert Skin: Skin is warm Psychiatric: Patient has normal mood and affect   Ortho Exam: Ortho exam demonstrates mild tenderness over the lateral malleolus.  Not so much over the ATFL or CFL.  No tenderness over the base of the fifth metatarsal.  No pain with pronation supination of the forefoot.  Palpable intact nontender anterior to posterior to peroneal and Achilles tendons.  Dorsiflexion is about 10 degrees past neutral plantarflexion 20 degrees past neutral.  Specialty Comments:  No specialty comments available.  Imaging: No results found.   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   Suicide attempt ([place]) 02/28/2017   Anxiety state 02/28/2017   Insomnia 02/28/2017   Herpes 11/12/2016   Irregular menstruation 07/06/2016   Past Medical  History:  Diagnosis Date   Anxiety    Asthma    Depression    Herpes    Polycystic ovaries     Family History  Problem Relation Age of Onset   Hypertension Mother    Anxiety disorder Mother    Heart murmur Father    Heart disease Maternal Grandfather    Diabetes Paternal Grandmother    Cancer Paternal Grandmother     Past Surgical History:  Procedure Laterality Date   CYSTECTOMY     Social History   Occupational History   Not on file  Tobacco Use   Smoking status: Former     Packs/day: 0.50    Types: Cigarettes   Smokeless tobacco: Never   Tobacco comments:    4 cigarettes per day  Vaping Use   Vaping Use: Every day   Substances: Nicotine, Flavoring  Substance and Sexual Activity   Alcohol use: Yes    Alcohol/week: 2.0 standard drinks    Types: 2 Cans of beer per week    Comment: occassional   Drug use: Yes    Types: Marijuana   Sexual activity: Yes    Birth control/protection: None

## 2021-07-01 ENCOUNTER — Ambulatory Visit: Payer: PRIVATE HEALTH INSURANCE | Admitting: Rehabilitative and Restorative Service Providers"

## 2021-07-16 ENCOUNTER — Other Ambulatory Visit: Payer: Self-pay

## 2021-07-16 ENCOUNTER — Ambulatory Visit (INDEPENDENT_AMBULATORY_CARE_PROVIDER_SITE_OTHER): Payer: Worker's Compensation | Admitting: Surgical

## 2021-07-16 ENCOUNTER — Encounter: Payer: Self-pay | Admitting: Orthopedic Surgery

## 2021-07-16 DIAGNOSIS — S93401A Sprain of unspecified ligament of right ankle, initial encounter: Secondary | ICD-10-CM

## 2021-07-16 MED ORDER — CELECOXIB 100 MG PO CAPS: 100.0000 mg | ORAL_CAPSULE | Freq: Two times a day (BID) | ORAL | 0 refills | Status: DC

## 2021-07-16 NOTE — Progress Notes (Signed)
Office Visit Note   Patient: Jenna Henry           Date of Birth: [birth year]           MRN: [id number] Visit Date: 07/16/2021 Requested by: Gildardo Pounds, NP [place],  [place] [address] PCP: Gildardo Pounds, NP  Subjective: Chief Complaint  Patient presents with   Right Ankle - Pain    HPI: Jenna Henry is a 26 y.o. female who presents to the office complaining of right ankle pain and instability.  Patient sustained right ankle inversion injury on 06/08/2021.  She was last seen by Dr. Marlou Sa about 3 weeks ago where she was only able to weight-bear in a cam boot and was able to ambulate out of the cam boot with crutches.  Today, she returns having done some physical therapy.  She notes some slight improvement and feels that she is progressing overall.  Her swelling feels improved though her pain persists, primarily when she is out of the cam boot.  While in the cam boot, she can walk fairly long distances but out of the cam boot she cannot walk more than 15 feet without her ankle being in severe pain and feeling like her ankle wants to give out on her and caused her to fall.  She denies any mechanical symptoms in the ankle.  She is continuing to go to physical therapy.  Not taking anything for pain..                ROS: All systems reviewed are negative as they relate to the chief complaint within the history of present illness.  Patient denies fevers or chills.  Assessment & Plan: Visit Diagnoses:  1. Moderate right ankle sprain, initial encounter     Plan: Patient is a 26 year old female who presents for repeat evaluation following right ankle inversion injury on 06/08/2021.  She is progressing to some degree but still having difficulty with right ankle instability and pain.  Swelling is improving but with her continued instability and pain to the point where she cannot walk more than 15 or 20 feet without the boot, plan is to order MRI of the right ankle for further evaluation  of right ankle instability.  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  Plan for out of work for 4 more weeks as we work-up her instability and continued pain.  Continue with physical therapy in the meantime.  Follow-up after MRI to review results.  Follow-Up Instructions: No follow-ups on file.   Orders:  Orders Placed This Encounter  Procedures   MR Ankle Right w/o contrast   Ambulatory referral to Physical Therapy   No orders of the defined types were placed in this encounter.     Procedures: No procedures performed   Clinical Data: No additional findings.  Objective: Vital Signs: There were no vitals taken for this visit.  Physical Exam:  Constitutional: Patient appears well-developed HEENT:  Head: Normocephalic Eyes:EOM are normal Neck: Normal range of motion Cardiovascular: Normal rate Pulmonary/chest: Effort normal Neurologic: Patient is alert Skin: Skin is warm Psychiatric: Patient has normal mood and affect  Ortho Exam: Ortho exam demonstrates right ankle with palpable pedal pulses.  Intact ankle dorsiflexion, plantarflexion, inversion, eversion.  Tenderness primarily over the ATFL and CFL as  well as lateral malleolus.  No tenderness over the medial malleolus or deltoid ligament.  No tenderness over the Achilles tendon or Achilles tendon insertion, Lisfranc complex, fifth metatarsal base.  Mild tenderness over the ankle joint line anteriorly and anterolaterally.  No significant skin changes noted.  No significant laxity with stressing of the syndesmosis.  No significant laxity that is felt with anterior drawer of the ankle.  Negative syndesmotic squeeze test.  Specialty Comments:  No specialty comments available.  Imaging: No results found.   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 [place]) 02/28/2017   Anxiety state 02/28/2017   Insomnia 02/28/2017   Herpes 11/12/2016   Irregular menstruation 07/06/2016   Past Medical History:  Diagnosis Date   Anxiety    Asthma    Depression    Herpes    Polycystic ovaries     Family History  Problem Relation Age of Onset   Hypertension Mother    Anxiety disorder Mother    Heart murmur Father    Heart disease Maternal Grandfather    Diabetes Paternal Grandmother    Cancer Paternal Grandmother     Past Surgical History:  Procedure Laterality Date   CYSTECTOMY     Social History   Occupational History   Not on file  Tobacco Use   Smoking status: Former    Packs/day: 0.50    Types: Cigarettes   Smokeless tobacco: Never   Tobacco comments:    4 cigarettes per day  Vaping Use   Vaping Use: Every day   Substances: Nicotine, Flavoring  Substance and Sexual Activity   Alcohol use: Yes    Alcohol/week: 2.0 standard drinks    Types: 2 Cans of beer per week    Comment: occassional   Drug use: Yes    Types: Marijuana   Sexual activity: Yes    Birth control/protection: None

## 2021-07-27 ENCOUNTER — Encounter: Payer: Self-pay | Admitting: Nurse Practitioner

## 2021-08-13 ENCOUNTER — Ambulatory Visit (INDEPENDENT_AMBULATORY_CARE_PROVIDER_SITE_OTHER): Payer: Worker's Compensation | Admitting: Orthopedic Surgery

## 2021-08-13 ENCOUNTER — Other Ambulatory Visit: Payer: Self-pay | Admitting: Orthopedic Surgery

## 2021-08-13 ENCOUNTER — Encounter: Payer: Self-pay | Admitting: Orthopedic Surgery

## 2021-08-13 ENCOUNTER — Other Ambulatory Visit: Payer: Self-pay

## 2021-08-13 DIAGNOSIS — S93431A Sprain of tibiofibular ligament of right ankle, initial encounter: Secondary | ICD-10-CM

## 2021-08-13 DIAGNOSIS — S93401A Sprain of unspecified ligament of right ankle, initial encounter: Secondary | ICD-10-CM | POA: Diagnosis not present

## 2021-08-13 NOTE — Progress Notes (Signed)
? ?Office Visit Note ?  ?Patient: Jenna Henry           ?Date of Birth: [birth year]           ?MRN: [id number] ?Visit Date: 08/13/2021 ?Requested by: Claiborne Rigg, NP ?[address] [place] ?[address] ?[place],  [place] [address] ?PCP: Claiborne Rigg, NP ? ?Subjective: ?Chief Complaint  ?Patient presents with  ?? Other  ?   ?Follow up ankle  ? ? ?HPI: Jenna Henry is a 26 y.o. female who presents to the office complaining of continued right ankle pain.  Sustained inversion injury to the right ankle on 06/08/2021.  She has been ambulating full weightbearing with fracture boot and states her pain is somewhat improved compared to last visit.  She does note continued instability at times that is less frequent than it was several weeks ago.  She is still unable to walk without the boot without increased pain to the medial aspect of her heel and primarily to the lateral aspect of the ankle diffusely around the lateral malleolus.  She denies any snapping or mechanical sensation.  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  She had MRI done at [facility] since last visit.Marland Kitchen   ?             ?ROS: All systems reviewed are negative as they relate to the chief complaint within the history of present illness.  Patient denies fevers or chills. ? ?Assessment & Plan: ?Visit Diagnoses:  ?1. Moderate right ankle sprain, initial encounter   ? ? ?Plan: Patient is a 26 year old female who presents following right ankle injury that she sustained while working as a Pensions consultant on 12/02/5327.  She continues to ambulate mostly with the fracture boot.  She is in physical therapy.  Had MRI done at [facility] that did demonstrate high-grade tear of the anterior talofibular ligament as well as distal tear of the syndesmosis with scarring and synovitis.  Other low-grade findings as well.  Based on her exam, the syndesmosis does not feel unstable but does seem to reproduce her symptoms when it is stressed.  She had  stress radiographs with fluoroscopy done today that did not demonstrate any significant side-to-side difference compared with her uninjured ankle as far as objective instability of the syndesmosis.  However, with the distal tearing and continued symptoms, plan for further evaluation by Dr. Lajoyce Corners for his consideration of optimal nonoperative versus operative management.  Plan for continued out of work for 3 weeks as he evaluates her and decides on the best course of action. ? ?Follow-Up Instructions: No follow-ups on file.  ? ?Orders:  ?Orders Placed This Encounter  ?Procedures  ?? Ambulatory referral to Orthopedic Surgery  ? ?No orders of the defined types were placed in this encounter. ? ? ? ? Procedures: ?No procedures performed ? ? ?Clinical Data: ?No additional findings. ? ?Objective: ?Vital Signs: There were no vitals taken for this visit. ? ?Physical Exam:  ?Constitutional: Patient appears well-developed ?HEENT:  ?Head: Normocephalic ?Eyes:EOM are normal ?Neck: Normal range of motion ?Cardiovascular: Normal rate ?Pulmonary/chest: Effort normal ?Neurologic: Patient is alert ?Skin: Skin is warm ?Psychiatric: Patient has normal mood and affect ? ?Ortho Exam: Ortho exam demonstrates right ankle with palpable pedal pulses and intact sensation throughout.  No tenderness over the medial or lateral malleoli.  Does have tenderness over the ATFL and just distal to the lateral malleolus.  There is no increased laxity with  anterior drawer of the ankle compared to the contralateral ankle.  No significant laxity to stressing of the syndesmosis compared with the left ankle.  Negative syndesmotic squeeze test.  Intact ankle dorsiflexion, plantarflexion, inversion, eversion actively. ? ?Specialty Comments:  ?No specialty comments available. ? ?Imaging: ?No results found. ? ? ?PMFS History: ?Patient Active Problem List  ? Diagnosis Date Noted  ?? Head trauma, sequela 06/03/2021  ?? New daily persistent headache 06/03/2021  ??  Dizziness and giddiness 06/03/2021  ?? Bipolar disorder ([facility]) 10/11/2018  ?? MDD (major depressive disorder) 10/10/2018  ?? Intentional drug overdose ([facility]) 10/09/2018  ?? Hypokalemia 10/09/2018  ?? 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 Palpitations 11/09/2017  ?? Chest pain 11/09/2017  ?? Cigarette smoker 11/09/2017  ?? Severe major depression without psychotic features ([facility]) 02/28/2017  ?? MDD (major depressive disorder), recurrent, severe, 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  ?? Suicide attempt ([facility]) 02/28/2017  ?? Anxiety state 02/28/2017  ?? Insomnia 02/28/2017  ?? Herpes 11/12/2016  ?? Irregular menstruation 07/06/2016  ? ?Past Medical History:  ?Diagnosis Date  ?? Anxiety   ?? Asthma   ?? Depression   ?? Herpes   ?? Polycystic ovaries   ?  ?Family History  ?Problem Relation Age of Onset  ?? Hypertension Mother   ?? Anxiety disorder Mother   ?? Heart murmur Father   ?? Heart disease Maternal Grandfather   ?? Diabetes Paternal Grandmother   ?? Cancer Paternal Grandmother   ?  ?Past Surgical History:  ?Procedure Laterality Date  ?? CYSTECTOMY    ? ?Social History  ? ?Occupational History  ?? Not on file  ?Tobacco Use  ?? Smoking status: Former  ?  Packs/day: 0.50  ?  Types: Cigarettes  ?? Smokeless tobacco: Never  ?? Tobacco comments:  ?  4 cigarettes per day  ?Vaping Use  ?? Vaping Use: Every day  ?? Substances: Nicotine, Flavoring  ?Substance and Sexual Activity  ?? Alcohol use: Yes  ?  Alcohol/week: 2.0 standard drinks  ?  Types: 2 Cans of beer per week  ?  Comment: occassional  ?? Drug use: Yes  ?  Types: Marijuana  ?? Sexual activity: Yes  ?  Birth control/protection: None  ? ? ? ? ?  ?

## 2021-08-15 ENCOUNTER — Encounter: Payer: Self-pay | Admitting: Orthopedic Surgery

## 2021-08-25 ENCOUNTER — Encounter: Payer: Self-pay | Admitting: Orthopedic Surgery

## 2021-08-25 ENCOUNTER — Ambulatory Visit (INDEPENDENT_AMBULATORY_CARE_PROVIDER_SITE_OTHER): Payer: Worker's Compensation | Admitting: Orthopedic Surgery

## 2021-08-25 DIAGNOSIS — S93491A Sprain of other ligament of right ankle, initial encounter: Secondary | ICD-10-CM

## 2021-08-25 NOTE — Progress Notes (Signed)
? ?Office Visit Note ?  ?Patient: Jenna Henry           ?Date of Birth: May 20, 1996           ?MRN: [id number] ?Visit Date: 08/25/2021 ?             ?Requested by: Cammy Copa, MD ?[address] ?[place],  [place] [address] ?PCP: Claiborne Rigg, NP ? ?Chief Complaint  ?Patient presents with  ? Right Ankle - Pain  ? ? ? ? ?HPI: ?Patient is a 26 year old woman who is seen for initial evaluation for right ankle sprain sustained on January 8.  Patient had radiographs obtained at that time and had an MRI scan obtained at [facility] February 23.  Patient complains of point tenderness over the anterior talofibular ligament. ? ?Assessment & Plan: ?Visit Diagnoses:  ?1. Sprain of anterior talofibular ligament of right ankle, initial encounter   ?2. High ankle sprain of right lower extremity, initial encounter   ? ? ?Plan: We will continue the fracture boot for 4 more weeks.  Discussed that if were not making improvement an option would be to reconstruct the anterior talofibular ligament and reinforced the syndesmosis with screws. ? ?Follow-Up Instructions: Return in about 4 weeks (around 09/22/2021).  ? ?Ortho Exam ? ?Patient is alert, oriented, no adenopathy, well-dressed, normal affect, normal respiratory effort. ?Examination patient has a good dorsalis pedis pulse she has maximal tenderness to palpation of the anterior talofibular ligament and the syndesmosis.  The proximal fibula is nontender to palpation.  She has a little bit of pain to palpation over the posterior tibial tendon and the peroneal tendons are nontender to palpation.  Review of her MRI scan shows an injury to the syndesmosis as well as the anterior talofibular ligament it does mention a low-grade sprain of the deltoid however she is not tender over the deltoid.  The MRI scan is also suggestive of a possible posterior tibial tendon injury and patient does have some tenderness to palpation over the posterior tibial tendon. ? ?Imaging: ?No results  found. ?No images are attached to the encounter. ? ?Labs: ?Lab Results  ?Component Value Date  ? HGBA1C 5.0 10/11/2018  ? HGBA1C 5.2 09/24/2017  ? HGBA1C 5.1 02/28/2017  ? REPTSTATUS 12/12/2019 FINAL 12/11/2019  ? CULT (A) 12/11/2019  ?  <10,000 COLONIES/mL INSIGNIFICANT GROWTH ?Performed at [facility], [address], [place], [place] [address] ?  ? ? ? ?Lab Results  ?Component Value Date  ? ALBUMIN 4.0 11/08/2020  ? ALBUMIN 3.9 12/14/2019  ? ALBUMIN 4.2 12/03/2019  ? ? ?Lab Results  ?Component Value Date  ? MG 2.1 10/09/2018  ? MG 2.2 09/24/2017  ? ?Lab Results  ?Component Value Date  ? VD25OH 22 (L) 06/05/2016  ? ? ?No results found for: PREALBUMIN ? ?  Latest Ref Rng & Units 05/16/2021  ? 11:59 AM 11/19/2020  ?  9:33 AM 11/08/2020  ? 11:33 AM  ?CBC EXTENDED  ?WBC 4.0 - 10.5 K/uL 5.8   8.0   6.7    ?RBC 3.87 - 5.11 MIL/uL 4.23   4.37   4.59    ?Hemoglobin 12.0 - 15.0 g/dL 27.5   17.0   01.7    ?HCT 36.0 - 46.0 % 42.4   42.2   44.7    ?Platelets 150 - 400 K/uL 258   229   244    ? ? ? ?There is no height or weight on file to calculate BMI. ? ?  Orders:  ?No orders of the defined types were placed in this encounter. ? ?No orders of the defined types were placed in this encounter. ? ? ? Procedures: ?No procedures performed ? ?Clinical Data: ?No additional findings. ? ?ROS: ? ?All other systems negative, except as noted in the HPI. ?Review of Systems ? ?Objective: ?Vital Signs: There were no vitals taken for this visit. ? ?Specialty Comments:  ?No specialty comments available. ? ?PMFS History: ?Patient Active Problem List  ? Diagnosis Date Noted  ? Head trauma, sequela 06/03/2021  ? New daily persistent headache 06/03/2021  ? Dizziness and giddiness 06/03/2021  ? Bipolar disorder ([facility]) 10/11/2018  ? MDD (major depressive disorder) 10/10/2018  ? Intentional drug overdose ([facility]) 10/09/2018  ? Hypokalemia 10/09/2018  ? 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 Palpitations 11/09/2017  ? Chest pain 11/09/2017  ?  Cigarette smoker 11/09/2017  ? Severe major depression without psychotic features ([facility]) 02/28/2017  ? MDD (major depressive disorder), recurrent, severe, 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  ? Suicide attempt ([facility]) 02/28/2017  ? Anxiety state 02/28/2017  ? Insomnia 02/28/2017  ? Herpes 11/12/2016  ? Irregular menstruation 07/06/2016  ? ?Past Medical History:  ?Diagnosis Date  ? Anxiety   ? Asthma   ? Depression   ? Herpes   ? Polycystic ovaries   ?  ?Family History  ?Problem Relation Age of Onset  ? Hypertension Mother   ? Anxiety disorder Mother   ? Heart murmur Father   ? Heart disease Maternal Grandfather   ? Diabetes Paternal Grandmother   ? Cancer Paternal Grandmother   ?  ?Past Surgical History:  ?Procedure Laterality Date  ? CYSTECTOMY    ? ?Social History  ? ?Occupational History  ? Not on file  ?Tobacco Use  ? Smoking status: Former  ?  Packs/day: 0.50  ?  Types: Cigarettes  ? Smokeless tobacco: Never  ? Tobacco comments:  ?  4 cigarettes per day  ?Vaping Use  ? Vaping Use: Every day  ? Substances: Nicotine, Flavoring  ?Substance and Sexual Activity  ? Alcohol use: Yes  ?  Alcohol/week: 2.0 standard drinks  ?  Types: 2 Cans of beer per week  ?  Comment: occassional  ? Drug use: Yes  ?  Types: Marijuana  ? Sexual activity: Yes  ?  Birth control/protection: None  ? ? ? ? ? ?

## 2021-09-22 ENCOUNTER — Encounter: Payer: Self-pay | Admitting: Orthopedic Surgery

## 2021-09-22 ENCOUNTER — Ambulatory Visit (INDEPENDENT_AMBULATORY_CARE_PROVIDER_SITE_OTHER): Payer: Worker's Compensation | Admitting: Orthopedic Surgery

## 2021-09-22 DIAGNOSIS — S93491A Sprain of other ligament of right ankle, initial encounter: Secondary | ICD-10-CM | POA: Diagnosis not present

## 2021-09-22 NOTE — Progress Notes (Signed)
? ?Office Visit Note ?  ?Patient: Jenna Henry           ?Date of Birth: 10-25-1995           ?MRN: [id number] ?Visit Date: 09/22/2021 ?             ?Requested by: Claiborne Rigg, NP ?[address] [place] ?[address] ?[place],  [place] [address] ?PCP: Claiborne Rigg, NP ? ?No chief complaint on file. ? ? ? ? ?HPI: ?Patient is a 26 year old woman who is about 11 weeks status post injury to the right ankle and syndesmosis.  Patient has undergone immobilization and still has persistent pain.  Pain primarily over the syndesmosis and anterior talofibular ligament. ? ?Assessment & Plan: ?Visit Diagnoses:  ?1. Sprain of anterior talofibular ligament of right ankle, initial encounter   ?2. High ankle sprain of right lower extremity, initial encounter   ? ? ?Plan: Due to failure of prolonged conservative therapy patient states she would like to proceed with surgical intervention.  We will plan for screw fixation of the syndesmosis as well as reconstruction of the anterior talofibular ligament.  Risk and benefits were discussed including persistent pain neurovascular injury need for additional surgery.  Patient states she understands wished to proceed at this time. ? ?Follow-Up Instructions: Return in about 2 weeks (around 10/06/2021).  ? ?Ortho Exam ? ?Patient is alert, oriented, no adenopathy, well-dressed, normal affect, normal respiratory effort. ?Examination patient has a palpable dorsalis pedis pulse the deltoid ligament is not tender to palpation the medial malleolus and lateral malleolus are nontender to palpation the peroneal and posterior tibial tendon are nontender to palpation.  Patient is point tender to palpation over the anterior talofibular ligament anterior drawer has about 2 mm of laxity.  Patient also has pain reproduced with compression across the syndesmosis and pain to palpation over the syndesmosis.  Her MRI scan that was obtained in February showed edema in the syndesmosis as well as the injury to the anterior  talofibular ligament there was some edema in the posterior tibial tendon however she is asymptomatic at this time.  There is also but edema in the deltoid ligament but her deltoid ligament is also asymptomatic. ? ?Imaging: ?No results found. ?No images are attached to the encounter. ? ?Labs: ?Lab Results  ?Component Value Date  ? HGBA1C 5.0 10/11/2018  ? HGBA1C 5.2 09/24/2017  ? HGBA1C 5.1 02/28/2017  ? REPTSTATUS 12/12/2019 FINAL 12/11/2019  ? CULT (A) 12/11/2019  ?  <10,000 COLONIES/mL INSIGNIFICANT GROWTH ?Performed at [facility], [address], [place], [place] [address] ?  ? ? ? ?Lab Results  ?Component Value Date  ? ALBUMIN 4.0 11/08/2020  ? ALBUMIN 3.9 12/14/2019  ? ALBUMIN 4.2 12/03/2019  ? ? ?Lab Results  ?Component Value Date  ? MG 2.1 10/09/2018  ? MG 2.2 09/24/2017  ? ?Lab Results  ?Component Value Date  ? VD25OH 22 (L) 06/05/2016  ? ? ?No results found for: PREALBUMIN ? ?  Latest Ref Rng & Units 05/16/2021  ? 11:59 AM 11/19/2020  ?  9:33 AM 11/08/2020  ? 11:33 AM  ?CBC EXTENDED  ?WBC 4.0 - 10.5 K/uL 5.8   8.0   6.7    ?RBC 3.87 - 5.11 MIL/uL 4.23   4.37   4.59    ?Hemoglobin 12.0 - 15.0 g/dL 63.8   45.3   64.6    ?HCT 36.0 - 46.0 % 42.4   42.2   44.7    ?Platelets  150 - 400 K/uL 258   229   244    ? ? ? ?There is no height or weight on file to calculate BMI. ? ?Orders:  ?No orders of the defined types were placed in this encounter. ? ?No orders of the defined types were placed in this encounter. ? ? ? Procedures: ?No procedures performed ? ?Clinical Data: ?No additional findings. ? ?ROS: ? ?All other systems negative, except as noted in the HPI. ?Review of Systems ? ?Objective: ?Vital Signs: There were no vitals taken for this visit. ? ?Specialty Comments:  ?No specialty comments available. ? ?PMFS History: ?Patient Active Problem List  ? Diagnosis Date Noted  ?? Head trauma, sequela 06/03/2021  ?? New daily persistent headache 06/03/2021  ?? Dizziness and giddiness 06/03/2021  ?? Bipolar  disorder (HCC) 10/11/2018  ?? MDD (major depressive disorder) 10/10/2018  ?? Intentional drug overdose (HCC) 10/09/2018  ?? Hypokalemia 10/09/2018  ?? 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 Palpitations 11/09/2017  ?? Chest pain 11/09/2017  ?? Cigarette smoker 11/09/2017  ?? Severe major depression without psychotic features (HCC) 02/28/2017  ?? MDD (major depressive disorder), recurrent, severe, 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  ?? Suicide attempt (HCC) 02/28/2017  ?? Anxiety state 02/28/2017  ?? Insomnia 02/28/2017  ?? Herpes 11/12/2016  ?? Irregular menstruation 07/06/2016  ? ?Past Medical History:  ?Diagnosis Date  ?? Anxiety   ?? Asthma   ?? Depression   ?? Herpes   ?? Polycystic ovaries   ?  ?Family History  ?Problem Relation Age of Onset  ?? Hypertension Mother   ?? Anxiety disorder Mother   ?? Heart murmur Father   ?? Heart disease Maternal Grandfather   ?? Diabetes Paternal Grandmother   ?? Cancer Paternal Grandmother   ?  ?Past Surgical History:  ?Procedure Laterality Date  ?? CYSTECTOMY    ? ?Social History  ? ?Occupational History  ?? Not on file  ?Tobacco Use  ?? Smoking status: Former  ?  Packs/day: 0.50  ?  Types: Cigarettes  ?? Smokeless tobacco: Never  ?? Tobacco comments:  ?  4 cigarettes per day  ?Vaping Use  ?? Vaping Use: Every day  ?? Substances: Nicotine, Flavoring  ?Substance and Sexual Activity  ?? Alcohol use: Yes  ?  Alcohol/week: 2.0 standard drinks  ?  Types: 2 Cans of beer per week  ?  Comment: occassional  ?? Drug use: Yes  ?  Types: Marijuana  ?? Sexual activity: Yes  ?  Birth control/protection: None  ? ? ? ? ? ?

## 2021-09-29 ENCOUNTER — Telehealth: Payer: Self-pay | Admitting: Orthopedic Surgery

## 2021-09-29 ENCOUNTER — Other Ambulatory Visit: Payer: Self-pay | Admitting: Nurse Practitioner

## 2021-09-29 MED ORDER — VALACYCLOVIR HCL 1 G PO TABS
1000.0000 mg | ORAL_TABLET | Freq: Two times a day (BID) | ORAL | 1 refills | Status: AC
  Filled 2021-09-29: qty 10, 5d supply, fill #0

## 2021-09-29 NOTE — Telephone Encounter (Signed)
Jenna Henry, it looks like he discussed surgery with her at her last appt. Do you have a surgery sheet for her? ?

## 2021-09-29 NOTE — Telephone Encounter (Signed)
Please advse if Dr Lajoyce Corners has seen the pts MRI.Jenna Henry ? ?She dropped them off 2 visits ago. ? ?Pt wants to know next steps?? Surgery?? ? ?Please call   ?

## 2021-09-30 ENCOUNTER — Other Ambulatory Visit: Payer: Self-pay

## 2021-10-22 ENCOUNTER — Other Ambulatory Visit: Payer: Self-pay

## 2021-10-22 ENCOUNTER — Telehealth: Payer: Self-pay | Admitting: Orthopedic Surgery

## 2021-10-22 ENCOUNTER — Encounter (HOSPITAL_BASED_OUTPATIENT_CLINIC_OR_DEPARTMENT_OTHER): Payer: Self-pay | Admitting: Orthopedic Surgery

## 2021-10-22 NOTE — Telephone Encounter (Signed)
Can you please review chart. Pt is scheduled for surgery can you please call?

## 2021-10-22 NOTE — Telephone Encounter (Signed)
Pt called requesting a call back from Terril or Autumn F. Pt states she has additional questions about her upcoming ankle surgery. Please call pt at [phone].

## 2021-10-28 ENCOUNTER — Ambulatory Visit (HOSPITAL_BASED_OUTPATIENT_CLINIC_OR_DEPARTMENT_OTHER): Payer: Worker's Compensation

## 2021-10-28 ENCOUNTER — Other Ambulatory Visit: Payer: Self-pay

## 2021-10-28 ENCOUNTER — Encounter (HOSPITAL_BASED_OUTPATIENT_CLINIC_OR_DEPARTMENT_OTHER): Payer: Self-pay | Admitting: Orthopedic Surgery

## 2021-10-28 ENCOUNTER — Ambulatory Visit (HOSPITAL_BASED_OUTPATIENT_CLINIC_OR_DEPARTMENT_OTHER): Payer: Worker's Compensation | Admitting: Anesthesiology

## 2021-10-28 ENCOUNTER — Encounter (HOSPITAL_BASED_OUTPATIENT_CLINIC_OR_DEPARTMENT_OTHER)
Admission: RE | Disposition: A | Discharge status: Home / Self Care | Payer: Self-pay | Source: Home / Self Care | Attending: Orthopedic Surgery

## 2021-10-28 ENCOUNTER — Ambulatory Visit (HOSPITAL_BASED_OUTPATIENT_CLINIC_OR_DEPARTMENT_OTHER)
Admission: RE | Admit: 2021-10-28 | Discharge: 2021-10-28 | Disposition: A | Discharge status: Home / Self Care | Payer: Worker's Compensation | Attending: Orthopedic Surgery | Admitting: Orthopedic Surgery

## 2021-10-28 DIAGNOSIS — S93431A Sprain of tibiofibular ligament of right ankle, initial encounter: Secondary | ICD-10-CM

## 2021-10-28 DIAGNOSIS — S93491S Sprain of other ligament of right ankle, sequela: Secondary | ICD-10-CM | POA: Diagnosis not present

## 2021-10-28 DIAGNOSIS — S93431S Sprain of tibiofibular ligament of right ankle, sequela: Secondary | ICD-10-CM | POA: Diagnosis not present

## 2021-10-28 DIAGNOSIS — J45909 Unspecified asthma, uncomplicated: Secondary | ICD-10-CM | POA: Insufficient documentation

## 2021-10-28 DIAGNOSIS — Z87891 Personal history of nicotine dependence: Secondary | ICD-10-CM | POA: Diagnosis not present

## 2021-10-28 DIAGNOSIS — X58XXXA Exposure to other specified factors, initial encounter: Secondary | ICD-10-CM | POA: Insufficient documentation

## 2021-10-28 DIAGNOSIS — S93491A Sprain of other ligament of right ankle, initial encounter: Secondary | ICD-10-CM | POA: Diagnosis not present

## 2021-10-28 DIAGNOSIS — F319 Bipolar disorder, unspecified: Secondary | ICD-10-CM | POA: Diagnosis not present

## 2021-10-28 DIAGNOSIS — S93401A Sprain of unspecified ligament of right ankle, initial encounter: Secondary | ICD-10-CM | POA: Diagnosis present

## 2021-10-28 DIAGNOSIS — N926 Irregular menstruation, unspecified: Secondary | ICD-10-CM

## 2021-10-28 HISTORY — PX: ANKLE RECONSTRUCTION: SHX1151

## 2021-10-28 LAB — POCT PREGNANCY, URINE: Preg Test, Ur: NEGATIVE

## 2021-10-28 SURGERY — RECONSTRUCTION, ANKLE
Anesthesia: General | Site: Ankle | Laterality: Right

## 2021-10-28 MED ORDER — ONDANSETRON HCL 4 MG/2ML IJ SOLN
INTRAMUSCULAR | Status: AC
  Filled 2021-10-28: qty 2

## 2021-10-28 MED ORDER — ONDANSETRON HCL 4 MG/2ML IJ SOLN: 4.0000 mg | Freq: Four times a day (QID) | INTRAMUSCULAR | Status: DC | PRN

## 2021-10-28 MED ORDER — MIDAZOLAM HCL 2 MG/2ML IJ SOLN
2.0000 mg | Freq: Once | INTRAMUSCULAR | Status: AC
  Administered 2021-10-28: 2 mg via INTRAVENOUS

## 2021-10-28 MED ORDER — LIDOCAINE 2% (20 MG/ML) 5 ML SYRINGE
INTRAMUSCULAR | Status: AC
  Filled 2021-10-28: qty 5

## 2021-10-28 MED ORDER — FENTANYL CITRATE (PF) 100 MCG/2ML IJ SOLN
INTRAMUSCULAR | Status: DC | PRN
  Administered 2021-10-28 (×2): 50 ug via INTRAVENOUS

## 2021-10-28 MED ORDER — DEXAMETHASONE SODIUM PHOSPHATE 10 MG/ML IJ SOLN
INTRAMUSCULAR | Status: AC
Start: 2021-10-28 — End: ?
  Filled 2021-10-28: qty 1

## 2021-10-28 MED ORDER — BUPIVACAINE-EPINEPHRINE (PF) 0.5% -1:200000 IJ SOLN
INTRAMUSCULAR | Status: DC | PRN
  Administered 2021-10-28: 30 mL via PERINEURAL

## 2021-10-28 MED ORDER — 0.9 % SODIUM CHLORIDE (POUR BTL) OPTIME
TOPICAL | Status: DC | PRN
  Administered 2021-10-28: 100 mL

## 2021-10-28 MED ORDER — CEFAZOLIN SODIUM-DEXTROSE 2-4 GM/100ML-% IV SOLN: 2.0000 g | INTRAVENOUS | Status: DC

## 2021-10-28 MED ORDER — LACTATED RINGERS IV SOLN: INTRAVENOUS | Status: DC | PRN

## 2021-10-28 MED ORDER — FENTANYL CITRATE (PF) 100 MCG/2ML IJ SOLN
INTRAMUSCULAR | Status: AC
  Filled 2021-10-28: qty 2

## 2021-10-28 MED ORDER — DEXAMETHASONE SODIUM PHOSPHATE 10 MG/ML IJ SOLN
INTRAMUSCULAR | Status: DC | PRN
  Administered 2021-10-28: 10 mg via INTRAVENOUS

## 2021-10-28 MED ORDER — DEXMEDETOMIDINE HCL IN NACL 400 MCG/100ML IV SOLN
INTRAVENOUS | Status: DC | PRN
  Administered 2021-10-28: 8 ug via INTRAVENOUS
  Administered 2021-10-28: 12 ug via INTRAVENOUS

## 2021-10-28 MED ORDER — ONDANSETRON HCL 4 MG/2ML IJ SOLN
INTRAMUSCULAR | Status: DC | PRN
  Administered 2021-10-28: 4 mg via INTRAVENOUS

## 2021-10-28 MED ORDER — MIDAZOLAM HCL 2 MG/2ML IJ SOLN
INTRAMUSCULAR | Status: AC
  Filled 2021-10-28: qty 2

## 2021-10-28 MED ORDER — CEFAZOLIN SODIUM-DEXTROSE 2-4 GM/100ML-% IV SOLN
INTRAVENOUS | Status: AC
Start: 2021-10-28 — End: ?
  Filled 2021-10-28: qty 100

## 2021-10-28 MED ORDER — HYDROCODONE-ACETAMINOPHEN 5-325 MG PO TABS: 1.0000 | ORAL_TABLET | ORAL | 0 refills | Status: DC | PRN

## 2021-10-28 MED ORDER — OXYCODONE HCL 5 MG/5ML PO SOLN: 5.0000 mg | Freq: Once | ORAL | Status: DC | PRN

## 2021-10-28 MED ORDER — PROPOFOL 10 MG/ML IV BOLUS
INTRAVENOUS | Status: DC | PRN
  Administered 2021-10-28: 200 mg via INTRAVENOUS

## 2021-10-28 MED ORDER — FENTANYL CITRATE (PF) 100 MCG/2ML IJ SOLN
100.0000 ug | Freq: Once | INTRAMUSCULAR | Status: AC
  Administered 2021-10-28: 50 ug via INTRAVENOUS

## 2021-10-28 MED ORDER — MIDAZOLAM HCL 2 MG/2ML IJ SOLN
INTRAMUSCULAR | Status: DC | PRN
  Administered 2021-10-28: 2 mg via INTRAVENOUS

## 2021-10-28 MED ORDER — EPHEDRINE SULFATE (PRESSORS) 50 MG/ML IJ SOLN
INTRAMUSCULAR | Status: DC | PRN
  Administered 2021-10-28: 10 mg via INTRAVENOUS

## 2021-10-28 MED ORDER — PROPOFOL 10 MG/ML IV BOLUS
INTRAVENOUS | Status: AC
Start: 2021-10-28 — End: ?
  Filled 2021-10-28: qty 20

## 2021-10-28 MED ORDER — OXYCODONE HCL 5 MG PO TABS: 5.0000 mg | ORAL_TABLET | Freq: Once | ORAL | Status: DC | PRN

## 2021-10-28 MED ORDER — LACTATED RINGERS IV SOLN: INTRAVENOUS | Status: DC

## 2021-10-28 MED ORDER — CEFAZOLIN SODIUM-DEXTROSE 2-3 GM-%(50ML) IV SOLR
INTRAVENOUS | Status: DC | PRN
  Administered 2021-10-28: 2 g via INTRAVENOUS

## 2021-10-28 MED ORDER — FENTANYL CITRATE (PF) 100 MCG/2ML IJ SOLN: 25.0000 ug | INTRAMUSCULAR | Status: DC | PRN

## 2021-10-28 SURGICAL SUPPLY — 56 items
BIT DRILL 2.5X110 QC LCP DISP (BIT) ×1 IMPLANT
BLADE SURG 15 STRL LF DISP TIS (BLADE) ×1 IMPLANT
BLADE SURG 15 STRL SS (BLADE) ×2
BNDG CMPR 9X4 STRL LF SNTH (GAUZE/BANDAGES/DRESSINGS) ×1
BNDG COHESIVE 4X5 TAN ST LF (GAUZE/BANDAGES/DRESSINGS) ×2 IMPLANT
BNDG ESMARK 4X9 LF (GAUZE/BANDAGES/DRESSINGS) ×1 IMPLANT
BOOT STEPPER DURA MED (SOFTGOODS) ×1 IMPLANT
COVER BACK TABLE 60X90IN (DRAPES) ×2 IMPLANT
DRAPE EXTREMITY T 121X128X90 (DISPOSABLE) ×2 IMPLANT
DRAPE U-SHAPE 47X51 STRL (DRAPES) IMPLANT
DRSG EMULSION OIL 3X3 NADH (GAUZE/BANDAGES/DRESSINGS) ×2 IMPLANT
DRSG PAD ABDOMINAL 8X10 ST (GAUZE/BANDAGES/DRESSINGS) ×1 IMPLANT
DURAPREP 26ML APPLICATOR (WOUND CARE) ×2 IMPLANT
ELECT REM PT RETURN 9FT ADLT (ELECTROSURGICAL) ×2
ELECTRODE REM PT RTRN 9FT ADLT (ELECTROSURGICAL) ×1 IMPLANT
GAUZE 4X4 16PLY ~~LOC~~+RFID DBL (SPONGE) IMPLANT
GAUZE SPONGE 4X4 12PLY STRL (GAUZE/BANDAGES/DRESSINGS) ×2 IMPLANT
GLOVE BIOGEL PI IND STRL 7.0 (GLOVE) IMPLANT
GLOVE BIOGEL PI IND STRL 9 (GLOVE) ×1 IMPLANT
GLOVE BIOGEL PI INDICATOR 7.0 (GLOVE) ×1
GLOVE BIOGEL PI INDICATOR 9 (GLOVE) ×1
GLOVE SURG ORTHO LTX SZ9 (GLOVE) ×2 IMPLANT
GLOVE SURG SS PI 7.0 STRL IVOR (GLOVE) ×1 IMPLANT
GOWN STRL REUS W/ TWL LRG LVL3 (GOWN DISPOSABLE) ×1 IMPLANT
GOWN STRL REUS W/ TWL XL LVL3 (GOWN DISPOSABLE) ×1 IMPLANT
GOWN STRL REUS W/TWL LRG LVL3 (GOWN DISPOSABLE) ×4
GOWN STRL REUS W/TWL XL LVL3 (GOWN DISPOSABLE) ×2
NDL 1/2 CIR CATGUT .05X1.09 (NEEDLE) IMPLANT
NEEDLE 1/2 CIR CATGUT .05X1.09 (NEEDLE) ×2 IMPLANT
NS IRRIG 1000ML POUR BTL (IV SOLUTION) ×2 IMPLANT
PACK BASIN DAY SURGERY FS (CUSTOM PROCEDURE TRAY) ×2 IMPLANT
PAD CAST 4YDX4 CTTN HI CHSV (CAST SUPPLIES) ×1 IMPLANT
PADDING CAST ABS 4INX4YD NS (CAST SUPPLIES)
PADDING CAST ABS COTTON 4X4 ST (CAST SUPPLIES) ×1 IMPLANT
PADDING CAST COTTON 4X4 STRL (CAST SUPPLIES) ×2
PENCIL SMOKE EVACUATOR (MISCELLANEOUS) ×1 IMPLANT
SCREW CORTEX 3.5 40MM (Screw) ×1 IMPLANT
SCREW CORTEX 3.5 45MM (Screw) ×1 IMPLANT
SCREW LOCK CORT ST 3.5X40 (Screw) IMPLANT
SHEET MEDIUM DRAPE 40X70 STRL (DRAPES) ×2 IMPLANT
SPIKE FLUID TRANSFER (MISCELLANEOUS) IMPLANT
SPONGE T-LAP 18X18 ~~LOC~~+RFID (SPONGE) ×2 IMPLANT
STOCKINETTE 6  STRL (DRAPES) ×1
STOCKINETTE 6 STRL (DRAPES) ×1 IMPLANT
SUCTION FRAZIER HANDLE 10FR (MISCELLANEOUS) ×1
SUCTION TUBE FRAZIER 10FR DISP (MISCELLANEOUS) IMPLANT
SUT ETHILON 3 0 FSL (SUTURE) IMPLANT
SUT ETHILON 3 0 FSLX (SUTURE) ×3 IMPLANT
SUT FIBERWIRE 2-0 18 17.9 3/8 (SUTURE) ×4
SUT VIC AB 2-0 CT1 27 (SUTURE) ×2
SUT VIC AB 2-0 CT1 TAPERPNT 27 (SUTURE) IMPLANT
SUTURE FIBERWR 2-0 18 17.9 3/8 (SUTURE) IMPLANT
SYR BULB EAR ULCER 3OZ GRN STR (SYRINGE) ×2 IMPLANT
SYR CONTROL 10ML LL (SYRINGE) IMPLANT
TOWEL GREEN STERILE FF (TOWEL DISPOSABLE) ×2 IMPLANT
TUBE CONNECTING 20X1/4 (TUBING) ×1 IMPLANT

## 2021-10-28 NOTE — Interval H&P Note (Signed)
History and Physical Interval Note:  10/28/2021 9:33 AM  Jenna Henry  has presented today for surgery, with the diagnosis of Right Lateral Ankle Sprain and Syndesmosis Sprain.  The various methods of treatment have been discussed with the patient and family. After consideration of risks, benefits and other options for treatment, the patient has consented to  Procedure(s): RIGHT ANTERIOR TALOFIBULAR LIGAMENT REPAIR AND SYNDESMOSIS FIXATION (Right) as a surgical intervention.  The patient's history has been reviewed, patient examined, no change in status, stable for surgery.  I have reviewed the patient's chart and labs.  Questions were answered to the patient's satisfaction.     Nadara Mustard

## 2021-10-28 NOTE — Progress Notes (Signed)
Assisted Dr. Hodierne with right, popliteal, ultrasound guided block. Side rails up, monitors on throughout procedure. See vital signs in flow sheet. Tolerated Procedure well. ?

## 2021-10-28 NOTE — Anesthesia Procedure Notes (Signed)
Anesthesia Regional Block: Popliteal block   Pre-Anesthetic Checklist: , timeout performed,  Correct Patient, Correct Site, Correct Laterality,  Correct Procedure, Correct Position, site marked,  Risks and benefits discussed,  Surgical consent,  Pre-op evaluation,  At surgeon's request and post-op pain management  Laterality: Right  Prep: chloraprep       Needles:  Injection technique: Single-shot  Needle Type: Echogenic Stimulator Needle          Additional Needles:   Procedures:, nerve stimulator,,,,,     Nerve Stimulator or Paresthesia:  Response: plantar flexion of foot, 0.45 mA  Additional Responses:   Narrative:  Start time: 10/28/2021 9:00 AM End time: 10/28/2021 9:11 AM Injection made incrementally with aspirations every 5 mL.  Performed by: Personally  Anesthesiologist: Achille Rich, MD  Additional Notes: Functioning IV was confirmed and monitors were applied.  A 34mm 21ga Arrow echogenic stimulator needle was used. Sterile prep and drape,hand hygiene and sterile gloves were used.  Negative aspiration and negative test dose prior to incremental administration of local anesthetic. The patient tolerated the procedure well.  Ultrasound guidance: relevent anatomy identified, needle position confirmed, local anesthetic spread visualized around nerve(s), vascular puncture avoided.  Image printed for medical record.

## 2021-10-28 NOTE — Transfer of Care (Signed)
Immediate Anesthesia Transfer of Care Note  Patient: Jenna Henry  Procedure(s) Performed: RIGHT ANTERIOR TALOFIBULAR LIGAMENT REPAIR AND SYNDESMOSIS FIXATION (Right: Ankle)  Patient Location: PACU  Anesthesia Type:General and Regional  Level of Consciousness: awake, alert  and oriented  Airway & Oxygen Therapy: Patient Spontanous Breathing and Patient connected to face mask oxygen  Post-op Assessment: Report given to RN and Post -op Vital signs reviewed and stable  Post vital signs: Reviewed and stable  Last Vitals:  Vitals Value Taken Time  BP    Temp    Pulse 66 10/28/21 1039  Resp 0 10/28/21 1039  SpO2 99 % 10/28/21 1039  Vitals shown include unvalidated device data.  Last Pain:  Vitals:   10/28/21 0759  TempSrc: Oral  PainSc: 7       Patients Stated Pain Goal: 3 (10/28/21 0759)  Complications: No notable events documented.

## 2021-10-28 NOTE — Discharge Instructions (Signed)

## 2021-10-28 NOTE — H&P (Signed)
Jenna Henry is an 26 y.o. female.   Chief Complaint: Right ankle pain with laxity of the syndesmosis and anterior talofibular ligament. HPI:  Patient is a 26 year old woman who is about 11 weeks status post injury to the right ankle and syndesmosis.  Patient has undergone immobilization and still has persistent pain.  Pain primarily over the syndesmosis and anterior talofibular ligament. Past Medical History:  Diagnosis Date   Anxiety    Asthma    Depression    Herpes    Polycystic ovaries     Past Surgical History:  Procedure Laterality Date   CYSTECTOMY      Family History  Problem Relation Age of Onset   Hypertension Mother    Anxiety disorder Mother    Heart murmur Father    Heart disease Maternal Grandfather    Diabetes Paternal Grandmother    Cancer Paternal Grandmother    Social History:  reports that she has quit smoking. Her smoking use included cigarettes. She smoked an average of .5 packs per day. She has never used smokeless tobacco. She reports current alcohol use of about 2.0 standard drinks per week. She reports current drug use. Drug: Marijuana.  Allergies:  Allergies  Allergen Reactions   Ortho Tri-Cyclen [Norgestimate-Eth Estradiol] Hives, Itching, Rash and Other (See Comments)    "Caused burning all over"    No medications prior to admission.    No results found for this or any previous visit (from the past 48 hour(s)). No results found.  Review of Systems  All other systems reviewed and are negative.  Height 5\' 6"  (1.676 m), weight 84.4 kg, last menstrual period 09/23/2021. Physical Exam  Patient is alert, oriented, no adenopathy, well-dressed, normal affect, normal respiratory effort. Examination patient has a palpable dorsalis pedis pulse the deltoid ligament is not tender to palpation the medial malleolus and lateral malleolus are nontender to palpation the peroneal and posterior tibial tendon are nontender to palpation.  Patient is point tender  to palpation over the anterior talofibular ligament anterior drawer has about 2 mm of laxity.  Patient also has pain reproduced with compression across the syndesmosis and pain to palpation over the syndesmosis.  Her MRI scan that was obtained in February showed edema in the syndesmosis as well as the injury to the anterior talofibular ligament there was some edema in the posterior tibial tendon however she is asymptomatic at this time.  There is also but edema in the deltoid ligament but her deltoid ligament is also asymptomatic. Assessment/Plan 1. Sprain of anterior talofibular ligament of right ankle, initial encounter   2. High ankle sprain of right lower extremity, initial encounter       Plan: Due to failure of prolonged conservative therapy patient states she would like to proceed with surgical intervention.  We will plan for screw fixation of the syndesmosis as well as reconstruction of the anterior talofibular ligament.  Risk and benefits were discussed including persistent pain neurovascular injury need for additional surgery.  Patient states she understands wished to proceed at this time.  March, MD 10/28/2021, 6:59 AM

## 2021-10-28 NOTE — Anesthesia Preprocedure Evaluation (Signed)
Anesthesia Evaluation  Patient identified by MRN, date of birth, ID band Patient awake    Reviewed: Allergy & Precautions, H&P , NPO status , Patient's Chart, lab work & pertinent test results  Airway Mallampati: II   Neck ROM: full    Dental   Pulmonary asthma , former smoker,    breath sounds clear to auscultation       Cardiovascular negative cardio ROS   Rhythm:regular Rate:Normal     Neuro/Psych  Headaches, PSYCHIATRIC DISORDERS Anxiety Depression Bipolar Disorder    GI/Hepatic   Endo/Other    Renal/GU      Musculoskeletal   Abdominal   Peds  Hematology   Anesthesia Other Findings   Reproductive/Obstetrics                             Anesthesia Physical Anesthesia Plan  ASA: 2  Anesthesia Plan: General   Post-op Pain Management: Regional block*   Induction: Intravenous  PONV Risk Score and Plan: 3 and Ondansetron, Dexamethasone, Midazolam and Treatment may vary due to age or medical condition  Airway Management Planned: LMA  Additional Equipment:   Intra-op Plan:   Post-operative Plan: Extubation in OR  Informed Consent: I have reviewed the patients History and Physical, chart, labs and discussed the procedure including the risks, benefits and alternatives for the proposed anesthesia with the patient or authorized representative who has indicated his/her understanding and acceptance.     Dental advisory given  Plan Discussed with: CRNA, Anesthesiologist and Surgeon  Anesthesia Plan Comments:         Anesthesia Quick Evaluation

## 2021-10-28 NOTE — Op Note (Signed)
10/28/2021  10:41 AM  PATIENT:  Jenna Henry    PRE-OPERATIVE DIAGNOSIS:  Right Lateral Ankle Sprain and Syndesmosis Sprain  POST-OPERATIVE DIAGNOSIS:  Same  PROCEDURE:  RIGHT ANTERIOR TALOFIBULAR LIGAMENT REPAIR AND SYNDESMOSIS FIXATION  SURGEON:  Nadara Mustard, MD  PHYSICIAN ASSISTANT:None ANESTHESIA:   General  PREOPERATIVE INDICATIONS:  [name] is a  26 y.o. female with a diagnosis of Right Lateral Ankle Sprain and Syndesmosis Sprain who failed conservative measures and elected for surgical management.    The risks benefits and alternatives were discussed with the patient preoperatively including but not limited to the risks of infection, bleeding, nerve injury, cardiopulmonary complications, the need for revision surgery, among others, and the patient was willing to proceed.  OPERATIVE IMPLANTS: 3.5 cortical compression screws x2, FiberWire 4 anterior talofibular ligament reconstruction.  @ENCIMAGES @  OPERATIVE FINDINGS: C-arm fluoroscopy verified congruence of the mortise with no widening of the syndesmosis.  OPERATIVE PROCEDURE: Patient was brought the operating room and underwent a general anesthetic.  After adequate levels anesthesia were obtained patient's right lower extremity was prepped using DuraPrep draped into a sterile field a timeout was called.  A curvilinear incision was made over the anterior talofibular ligament.  Blunt dissection was carried down to the ligament and a football shape of the redundant ligament was excised.  This was repaired with 6 strands crossing the reconstruction site with 2-0 FiberWire with the foot everted.  Anterior drawer after reconstruction showed a negative anterior drawer.  The wound was irrigated normal saline incision closed using 2-0 nylon.  Attention was then focused on the syndesmosis.  A incision was made over the lateral fibula and to 3.5 cortical screws were used to stabilize the syndesmosis with 3 cortices of stabilization for 2  screws.  One screw was 40 mm the other 45 mm.  C-arm possibly verified reduction of the syndesmosis.  The wound was irrigated normal saline incision closed using 2-0 nylon.  A sterile dressing was applied patient was placed in a fracture boot extubated taken the PACU in stable condition.   DISCHARGE PLANNING:  Antibiotic duration: Preoperative antibiotics  Weightbearing: Ideally nonweightbearing but patient may be touchdown weightbearing as needed  Pain medication: Prescription for Vicodin  Dressing care/ Wound VAC: Follow-up in the office 1 week to change the dressing  Ambulatory devices: Crutches  Discharge to: Home.  Follow-up: In the office 1 week post operative.

## 2021-10-29 ENCOUNTER — Encounter (HOSPITAL_BASED_OUTPATIENT_CLINIC_OR_DEPARTMENT_OTHER): Payer: Self-pay | Admitting: Orthopedic Surgery

## 2021-10-31 NOTE — Progress Notes (Signed)
Pt called [facility] today with c/o numbness on the tip of her tongue. She had an LMA placed for her surgery. I discussed with her that this is not an uncommon occurrence following surgery. What typically causes the numbness is that the tongue gets caught between the LMA and the lower teeth, pinching the nerve that runs along the underside of the tongue. The numbness can last for a few days but should be resolved by 7-10 days. I told the patient that if the numbness has not resolved by late next week to give [place] a call back.

## 2021-11-03 ENCOUNTER — Other Ambulatory Visit: Payer: Self-pay

## 2021-11-03 ENCOUNTER — Other Ambulatory Visit: Payer: Self-pay | Admitting: Nurse Practitioner

## 2021-11-03 DIAGNOSIS — J4541 Moderate persistent asthma with (acute) exacerbation: Secondary | ICD-10-CM

## 2021-11-03 MED ORDER — ALBUTEROL SULFATE HFA 108 (90 BASE) MCG/ACT IN AERS
2.0000 | INHALATION_SPRAY | RESPIRATORY_TRACT | 0 refills | Status: DC | PRN
  Filled 2021-11-03: qty 18, 16d supply, fill #0

## 2021-11-06 ENCOUNTER — Ambulatory Visit (INDEPENDENT_AMBULATORY_CARE_PROVIDER_SITE_OTHER): Payer: Worker's Compensation | Admitting: Orthopedic Surgery

## 2021-11-06 DIAGNOSIS — S93491A Sprain of other ligament of right ankle, initial encounter: Secondary | ICD-10-CM

## 2021-11-10 ENCOUNTER — Encounter: Payer: PRIVATE HEALTH INSURANCE | Admitting: Orthopedic Surgery

## 2021-11-12 NOTE — Anesthesia Postprocedure Evaluation (Signed)
Anesthesia Post Note  Patient: Social research officer, government  Procedure(s) Performed: RIGHT ANTERIOR TALOFIBULAR LIGAMENT REPAIR AND SYNDESMOSIS FIXATION (Right: Ankle)     Patient location during evaluation: PACU Anesthesia Type: General and Regional Level of consciousness: awake and alert Pain management: pain level controlled Vital Signs Assessment: post-procedure vital signs reviewed and stable Respiratory status: spontaneous breathing, nonlabored ventilation, respiratory function stable and patient connected to nasal cannula oxygen Cardiovascular status: blood pressure returned to baseline and stable Postop Assessment: no apparent nausea or vomiting Anesthetic complications: no   No notable events documented.  Last Vitals:  Vitals:   10/28/21 1115 10/28/21 1145  BP: 125/84 118/81  Pulse: 75 64  Resp: 17 12  Temp:  36.4 C  SpO2: 100% 97%    Last Pain:  Vitals:   10/29/21 0911  TempSrc:   PainSc: 0-No pain                 Zethan Alfieri S

## 2021-11-13 ENCOUNTER — Encounter: Payer: Self-pay | Admitting: Orthopedic Surgery

## 2021-11-13 NOTE — Progress Notes (Signed)
Office Visit Note   Patient: Jenna Henry           Date of Birth: 1995/08/15           MRN: [id number] Visit Date: 11/06/2021              Requested by: Claiborne Rigg, NP [address] [place],  [place] [address] PCP: Claiborne Rigg, NP  Chief Complaint  Patient presents with   Right Ankle - Routine Post Op    10/28/2021 ATFL repair and syndesmosis fixation        HPI: Patient is a 26 year old woman who presents status post right anterior talofibular ligament reconstruction and syndesmotic fixation she is 10 days out from surgery.  Assessment & Plan: Visit Diagnoses:  1. Sprain of anterior talofibular ligament of right ankle, initial encounter     Plan: We will continue weightbearing with crutches in the fracture boot.  She will start working on range of motion of the ankle.  At follow-up start physical therapy.  Follow-Up Instructions: Return in about 3 weeks (around 11/27/2021).   Ortho Exam  Patient is alert, oriented, no adenopathy, well-dressed, normal affect, normal respiratory effort. Examination the incisions are well-healed.  We will remove the stitches today.  There is no redness no cellulitis no drainage no signs of infection.  Imaging: No results found. No images are attached to the encounter.  Labs: Lab Results  Component Value Date   HGBA1C 5.0 10/11/2018   HGBA1C 5.2 09/24/2017   HGBA1C 5.1 02/28/2017   REPTSTATUS 12/12/2019 FINAL 12/11/2019   CULT (A) 12/11/2019    <10,000 COLONIES/mL INSIGNIFICANT GROWTH Performed at [facility], [address], [place], [place] [address]      Lab Results  Component Value Date   ALBUMIN 4.0 11/08/2020   ALBUMIN 3.9 12/14/2019   ALBUMIN 4.2 12/03/2019    Lab Results  Component Value Date   MG 2.1 10/09/2018   MG 2.2 09/24/2017   Lab Results  Component Value Date   VD25OH 22 (L) 06/05/2016    No results found for: "PREALBUMIN"    Latest Ref Rng & Units 05/16/2021   11:59  AM 11/19/2020    9:33 AM 11/08/2020   11:33 AM  CBC EXTENDED  WBC 4.0 - 10.5 K/uL 5.8  8.0  6.7   RBC 3.87 - 5.11 MIL/uL 4.23  4.37  4.59   Hemoglobin 12.0 - 15.0 g/dL 43.3  29.5  18.8   HCT 36.0 - 46.0 % 42.4  42.2  44.7   Platelets 150 - 400 K/uL 258  229  244      There is no height or weight on file to calculate BMI.  Orders:  No orders of the defined types were placed in this encounter.  No orders of the defined types were placed in this encounter.    Procedures: No procedures performed  Clinical Data: No additional findings.  ROS:  All other systems negative, except as noted in the HPI. Review of Systems  Objective: Vital Signs: LMP 10/28/2021 (Approximate)   Specialty Comments:  No specialty comments available.  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   Palpitations 11/09/2017   Chest pain 11/09/2017   Cigarette smoker 11/09/2017   Severe major depression without psychotic features (HCC) 02/28/2017   MDD (major depressive disorder), recurrent, severe, 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   Suicide attempt (HCC) 02/28/2017   Anxiety state 02/28/2017   Insomnia 02/28/2017   Herpes 11/12/2016   Irregular menstruation 07/06/2016   Past Medical History:  Diagnosis Date   Anxiety    Asthma    Depression    Herpes    Polycystic ovaries     Family History  Problem Relation Age of Onset   Hypertension Mother    Anxiety disorder Mother    Heart murmur Father    Heart disease Maternal Grandfather    Diabetes Paternal Grandmother     Cancer Paternal Grandmother     Past Surgical History:  Procedure Laterality Date   ANKLE RECONSTRUCTION Right 10/28/2021   Procedure: RIGHT ANTERIOR TALOFIBULAR LIGAMENT REPAIR AND SYNDESMOSIS FIXATION;  Surgeon: Nadara Mustard, MD;  Location: [facility];  Service: Orthopedics;  Laterality: Right;  regional block   CYSTECTOMY     Social History   Occupational History   Not on file  Tobacco Use   Smoking status: Former    Packs/day: 0.50    Types: Cigarettes   Smokeless tobacco: Never   Tobacco comments:    4 cigarettes per day  Vaping Use   Vaping Use: Every day   Substances: Nicotine, Flavoring  Substance and Sexual Activity   Alcohol use: Yes    Alcohol/week: 2.0 standard drinks of alcohol    Types: 2 Cans of beer per week    Comment: occassional   Drug use: Yes    Types: Marijuana    Comment: daily   Sexual activity: Yes    Birth control/protection: None

## 2021-11-18 ENCOUNTER — Other Ambulatory Visit: Payer: Self-pay | Admitting: Orthopedic Surgery

## 2021-11-19 ENCOUNTER — Telehealth: Payer: Self-pay | Admitting: Family

## 2021-11-19 ENCOUNTER — Ambulatory Visit: Payer: Self-pay | Admitting: Physician Assistant

## 2021-11-19 ENCOUNTER — Other Ambulatory Visit: Payer: Self-pay

## 2021-11-19 MED ORDER — HYDROCODONE-ACETAMINOPHEN 5-325 MG PO TABS
1.0000 | ORAL_TABLET | Freq: Four times a day (QID) | ORAL | 0 refills | Status: DC | PRN
  Filled 2021-11-19: qty 30, 8d supply, fill #0

## 2021-11-19 MED ORDER — HYDROCODONE-ACETAMINOPHEN 5-325 MG PO TABS: 1.0000 | ORAL_TABLET | Freq: Four times a day (QID) | ORAL | 0 refills | Status: DC | PRN

## 2021-11-19 NOTE — Telephone Encounter (Signed)
Pt came into office today needing a refill for her hydrocodone.  Last filled 10/28/21 #30 S/p 10/28/21 right anterior talofibular ligament repair and syndesmosis fixation.

## 2021-11-19 NOTE — Telephone Encounter (Signed)
Pt called requesting her medication went to wrong pharmacy. Pt is asking for refill to go to [facility] on [address] [facility]. Pt phone number is [phone].

## 2021-11-19 NOTE — Addendum Note (Signed)
Addended by: Barnie Del R on: 11/19/2021 01:06 PM   Modules accepted: Orders

## 2021-11-21 ENCOUNTER — Other Ambulatory Visit: Payer: Self-pay

## 2021-11-27 ENCOUNTER — Ambulatory Visit (INDEPENDENT_AMBULATORY_CARE_PROVIDER_SITE_OTHER): Payer: PRIVATE HEALTH INSURANCE | Admitting: Orthopedic Surgery

## 2021-11-27 DIAGNOSIS — S93491A Sprain of other ligament of right ankle, initial encounter: Secondary | ICD-10-CM

## 2021-11-27 NOTE — Progress Notes (Signed)
Office Visit Note   Patient: Jenna Henry           Date of Birth: 1995-07-04           MRN: [id number] Visit Date: 11/27/2021              Requested by: Claiborne Rigg, NP [address] [place],  [place] [address] PCP: Claiborne Rigg, NP  Chief Complaint  Patient presents with   Right Ankle - Routine Post Op    10/28/21 right ATFL repair and syndesmosis fixation       HPI: Patient is a 26 year old woman who presents 4 weeks status post anterior talofibular ligament reconstruction and syndesmosis stabilization.  She is currently nonweightbearing with crutches in a fracture boot.  Assessment & Plan: Visit Diagnoses:  1. Sprain of anterior talofibular ligament of right ankle, initial encounter   2. High ankle sprain of right lower extremity, initial encounter     Plan: We will set patient up for physical therapy at neuro rehab for range of motion and strengthening of the right ankle she may advance weightbearing as tolerated wean out of the fracture boot as tolerated  Follow-Up Instructions: No follow-ups on file.   Ortho Exam  Patient is alert, oriented, no adenopathy, well-dressed, normal affect, normal respiratory effort. Examination patient has swelling with well-healed incisions.  She has dorsiflexion just short of neutral.  She has decreased range of motion of her toes secondary to the swelling.  Anterior drawer is stable.  Patient was given instructions and demonstrated dorsiflexion exercises as well as active range of motion of her toes and elevation.  Imaging: No results found. No images are attached to the encounter.  Labs: Lab Results  Component Value Date   HGBA1C 5.0 10/11/2018   HGBA1C 5.2 09/24/2017   HGBA1C 5.1 02/28/2017   REPTSTATUS 12/12/2019 FINAL 12/11/2019   CULT (A) 12/11/2019    <10,000 COLONIES/mL INSIGNIFICANT GROWTH Performed at [facility], [address] [place], [place] [address]      Lab Results  Component  Value Date   ALBUMIN 4.0 11/08/2020   ALBUMIN 3.9 12/14/2019   ALBUMIN 4.2 12/03/2019    Lab Results  Component Value Date   MG 2.1 10/09/2018   MG 2.2 09/24/2017   Lab Results  Component Value Date   VD25OH 22 (L) 06/05/2016    No results found for: "PREALBUMIN"    Latest Ref Rng & Units 05/16/2021   11:59 AM 11/19/2020    9:33 AM 11/08/2020   11:33 AM  CBC EXTENDED  WBC 4.0 - 10.5 K/uL 5.8  8.0  6.7   RBC 3.87 - 5.11 MIL/uL 4.23  4.37  4.59   Hemoglobin 12.0 - 15.0 g/dL 63.1  49.7  02.6   HCT 36.0 - 46.0 % 42.4  42.2  44.7   Platelets 150 - 400 K/uL 258  229  244      There is no height or weight on file to calculate BMI.  Orders:  Orders Placed This Encounter  Procedures   Ambulatory referral to Physical Therapy   No orders of the defined types were placed in this encounter.    Procedures: No procedures performed  Clinical Data: No additional findings.  ROS:  All other systems negative, except as noted in the HPI. Review of Systems  Objective: Vital Signs: LMP 10/28/2021 (Approximate)   Specialty Comments:  No specialty comments available.  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   Palpitations 11/09/2017   Chest pain 11/09/2017   Cigarette smoker 11/09/2017   Severe major depression without psychotic features (HCC) 02/28/2017   MDD (major depressive disorder), recurrent, severe, 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   Suicide attempt (HCC) 02/28/2017   Anxiety state 02/28/2017   Insomnia 02/28/2017   Herpes 11/12/2016    Irregular menstruation 07/06/2016   Past Medical History:  Diagnosis Date   Anxiety    Asthma    Depression    Herpes    Polycystic ovaries     Family History  Problem Relation Age of Onset   Hypertension Mother    Anxiety disorder Mother    Heart murmur Father    Heart disease Maternal Grandfather    Diabetes Paternal Grandmother    Cancer Paternal Grandmother     Past Surgical History:  Procedure Laterality Date   ANKLE RECONSTRUCTION Right 10/28/2021   Procedure: RIGHT ANTERIOR TALOFIBULAR LIGAMENT REPAIR AND SYNDESMOSIS FIXATION;  Surgeon: Nadara Mustard, MD;  Location: [facility];  Service: Orthopedics;  Laterality: Right;  regional block   CYSTECTOMY     Social History   Occupational History   Not on file  Tobacco Use   Smoking status: Former    Packs/day: 0.50    Types: Cigarettes   Smokeless tobacco: Never   Tobacco comments:    4 cigarettes per day  Vaping Use   Vaping Use: Every day   Substances: Nicotine, Flavoring  Substance and Sexual Activity   Alcohol use: Yes    Alcohol/week: 2.0 standard drinks of alcohol    Types: 2 Cans of beer per week    Comment: occassional   Drug use: Yes    Types: Marijuana    Comment: daily   Sexual activity: Yes    Birth control/protection: None

## 2021-11-28 ENCOUNTER — Encounter: Payer: Self-pay | Admitting: Orthopedic Surgery

## 2021-12-05 ENCOUNTER — Other Ambulatory Visit: Payer: Self-pay

## 2021-12-08 ENCOUNTER — Ambulatory Visit: Payer: Worker's Compensation | Attending: Orthopedic Surgery

## 2021-12-08 ENCOUNTER — Telehealth: Payer: Self-pay

## 2021-12-08 DIAGNOSIS — R262 Difficulty in walking, not elsewhere classified: Secondary | ICD-10-CM | POA: Insufficient documentation

## 2021-12-08 DIAGNOSIS — M25671 Stiffness of right ankle, not elsewhere classified: Secondary | ICD-10-CM | POA: Insufficient documentation

## 2021-12-08 DIAGNOSIS — S93491A Sprain of other ligament of right ankle, initial encounter: Secondary | ICD-10-CM | POA: Diagnosis not present

## 2021-12-08 NOTE — Addendum Note (Signed)
Addended by: Merry Lofty A on: 12/08/2021 04:23 PM   Modules accepted: Orders

## 2021-12-08 NOTE — Therapy (Signed)
OUTPATIENT PHYSICAL THERAPY NEURO EVALUATION   Patient Name: Jenna Henry MRN: [id number] DOB:11/26/1995, 26 y.o., female Today's Date: 12/08/2021   PCP: Bertram Denver, NP REFERRING PROVIDER: Nadara Mustard, MD    PT End of Session - 12/08/21 1447     Visit Number 1    Number of Visits 7    Date for PT Re-Evaluation 01/19/22    Authorization Type medicaid    PT Start Time 1446    PT Stop Time 1530    PT Time Calculation (min) 44 min    Activity Tolerance Patient tolerated treatment well    Behavior During Therapy WFL for tasks assessed/performed             Past Medical History:  Diagnosis Date   Anxiety    Asthma    Depression    Herpes    Polycystic ovaries    Past Surgical History:  Procedure Laterality Date   ANKLE RECONSTRUCTION Right 10/28/2021   Procedure: RIGHT ANTERIOR TALOFIBULAR LIGAMENT REPAIR AND SYNDESMOSIS FIXATION;  Surgeon: Nadara Mustard, MD;  Location: [facility];  Service: Orthopedics;  Laterality: Right;  regional block   CYSTECTOMY     Patient Active Problem List   Diagnosis Date Noted   Sprain of anterior talofibular ligament of right ankle    Syndesmotic disruption of right ankle    Head trauma, sequela 06/03/2021   New daily persistent headache 06/03/2021   Dizziness and giddiness 06/03/2021   Bipolar disorder (HCC) 10/11/2018   MDD (major depressive disorder) 10/10/2018   Intentional drug overdose (HCC) 10/09/2018   Hypokalemia 10/09/2018   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   Palpitations 11/09/2017   Chest pain 11/09/2017   Cigarette smoker 11/09/2017   Severe major depression without psychotic features (HCC) 02/28/2017   MDD (major depressive disorder), recurrent, severe, 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   Suicide attempt (HCC) 02/28/2017   Anxiety state 02/28/2017   Insomnia 02/28/2017   Herpes 11/12/2016   Irregular menstruation 07/06/2016    ONSET DATE: 06/08/21     REFERRING DIAG: O53.664Q (ICD-10-CM) -  Sprain of anterior talofibular ligament of right ankle, initial encounter S93.491A (ICD-10-CM) - High ankle sprain of right lower extremity, initial encounter   THERAPY DIAG:  Difficulty in walking, not elsewhere classified  Decreased range of motion of right ankle  Rationale for Evaluation and Treatment Rehabilitation  SUBJECTIVE:                                                                                                                                                                                              SUBJECTIVE STATEMENT: Patient reports  doing well. Walks into clinic with CAM boot, no crutches, She reports walking without the crutches for ~1 week. Patient with anxiety about returning to work stating that her and referring provider will discuss return to work plan at her next f/u appt. She reports that she has gone grocery shopping without crutches. At home, "hops like a bunny" without crutches or CAM boot on around her house.  Pt accompanied by: self  PERTINENT HISTORY: anxiety, asthma, depression, previous suicide attempt  PAIN:  Are you having pain? Yes: NPRS scale: 5/10 Pain location: R ankle  Pain description: aching   PRECAUTIONS: Fall  WEIGHT BEARING RESTRICTIONS  referral to progress tolerance to weight bearing/off of crutches- did not specify in or out of CAM boot.   FALLS: Has patient fallen in last 6 months? No  LIVING ENVIRONMENT: Lives with: lives with their family Lives in: House/apartment Stairs: Yes: Internal: 12-14 steps; on left going up Has following equipment at home: Crutches  PLOF: Independent  PATIENT GOALS "I want to be able to walk again"  OBJECTIVE:   DIAGNOSTIC FINDINGS: x-ray negative for fx  COGNITION: Overall cognitive status: Within functional limits for tasks assessed   SENSATION: Hypersensitive to touch over incisions  COORDINATION: WFL  EDEMA:  1+ R ankle/foot   POSTURE: No Significant postural  limitations  LOWER EXTREMITY ROM:     Active  Right Eval Left Eval  Hip flexion    Hip extension    Hip abduction    Hip adduction    Hip internal rotation    Hip external rotation    Knee flexion    Knee extension    Ankle dorsiflexion -18   Ankle plantarflexion 41   Ankle inversion 20   Ankle eversion 3    (Blank rows = not tested)  LOWER EXTREMITY MMT:   WFL   TRANSFERS: Assistive device utilized: None  Sit to stand: Complete Independence Stand to sit: Complete Independence Chair to chair: Complete Independence  GAIT: Gait pattern: step through pattern and antalgic Distance walked: clinic Assistive device utilized: None Level of assistance: Complete Independence Comments: gait mechanics limited by CAM boot   PATIENT SURVEYS:  LEFS 51.25  TODAY'S TREATMENT:  N/a eval   PATIENT EDUCATION: Education details: PT POC, OM results Person educated: Patient Education method: Explanation Education comprehension: verbalized understanding   HOME EXERCISE PROGRAM: To be provided    GOALS: Goals reviewed with patient? Yes  SHORT TERM GOALS: Target date: 12/29/2021  Pt will be independent with initial HEP for improved ankle ROM and strength  Baseline: to be provided Goal status: INITIAL   LONG TERM GOALS: Target date: 01/19/2022  Pt will be independent with final  HEP for improved ankle ROM and strength   Baseline: to be provided Goal status: INITIAL  2.  Patient will achieve neutral dorsiflexion in R ankle AROM Baseline: -18* Goal status: INITIAL  3.  Patient will tolerate walking household distances outside of CAM boot Baseline: unable to at this time Goal status: INITIAL  4.  Patient will improve LEFS score to >/= 61 Baseline: 51.25 Goal status: INITIAL   ASSESSMENT:  CLINICAL IMPRESSION: Patient is a 26 y.o. female who was seen today for physical therapy evaluation and treatment for decreased AROM/ strength and impaired gait mechanics due  to recent ATFL repair. Her LEFS score is indicative of mild/moderate disability related to her ankle sx. Dr orders unclear at this time if patient can progress gait outside of boot to tolerance, or remain  within boot for all ambulation. PT to clarify.      OBJECTIVE IMPAIRMENTS Abnormal gait, decreased activity tolerance, decreased balance, decreased knowledge of condition, decreased knowledge of use of DME, decreased mobility, difficulty walking, decreased ROM, decreased strength, increased edema, impaired sensation, and pain.   ACTIVITY LIMITATIONS carrying, lifting, standing, squatting, stairs, transfers, bathing, and locomotion level  PARTICIPATION LIMITATIONS: cleaning, laundry, interpersonal relationship, driving, shopping, community activity, occupation, and yard work  PERSONAL FACTORS Behavior pattern, Past/current experiences, Social background, Time since onset of injury/illness/exacerbation, and 1-2 comorbidities: anxiety, depression  are also affecting patient's functional outcome.   REHAB POTENTIAL: Good  CLINICAL DECISION MAKING: Stable/uncomplicated  EVALUATION COMPLEXITY: Low  PLAN: PT FREQUENCY: 1x/week  PT DURATION: 6 weeks  PLANNED INTERVENTIONS: Therapeutic exercises, Therapeutic activity, Neuromuscular re-education, Balance training, Gait training, Patient/Family education, Joint mobilization, Stair training, Vestibular training, Visual/preceptual remediation/compensation, Orthotic/Fit training, DME instructions, Aquatic Therapy, Dry Needling, Electrical stimulation, Cryotherapy, Moist heat, Manual lymph drainage, Compression bandaging, scar mobilization, Splintting, Taping, Vasopneumatic device, Manual therapy, and Re-evaluation  PLAN FOR NEXT SESSION: provide HEP, scar mobilization, desensitization   Westley Foots, PT Westley Foots, PT, DPT, CBIS  12/08/2021, 3:38 PM

## 2021-12-08 NOTE — Telephone Encounter (Signed)
Dr. Lajoyce Corners, Ms. Careaga is being treated by physical therapy for increasing ROM and strength to her R ankle after sx.  Please clarify if she is able to progress mobility, such as ambulation, outside the fracture boot as she is already ambulating with the boot without crutches adequately.   Please submit request in EPIC under MD Order, Other Orders Thank you, Westley Foots, PT, DPT, [facility] [address] [place], [place]  [address] Phone:  [phone] Fax:  [phone]

## 2021-12-14 ENCOUNTER — Other Ambulatory Visit: Payer: Self-pay | Admitting: Orthopedic Surgery

## 2021-12-14 DIAGNOSIS — S93491A Sprain of other ligament of right ankle, initial encounter: Secondary | ICD-10-CM

## 2021-12-16 ENCOUNTER — Ambulatory Visit: Payer: Worker's Compensation

## 2021-12-16 DIAGNOSIS — M25671 Stiffness of right ankle, not elsewhere classified: Secondary | ICD-10-CM

## 2021-12-16 DIAGNOSIS — R262 Difficulty in walking, not elsewhere classified: Secondary | ICD-10-CM

## 2021-12-16 NOTE — Therapy (Signed)
OUTPATIENT PHYSICAL THERAPY TREATMENT NOTE   Patient Name: Anna Beaird MRN: [id number] DOB:02/21/1996, 26 y.o., female Today's Date: 12/16/2021  PCP: Bertram Denver, NP  REFERRING PROVIDER: Aldean Baker, MD  END OF SESSION:   PT End of Session - 12/16/21 1152     Visit Number 2    Number of Visits 7    Date for PT Re-Evaluation 01/19/22    Authorization Type medicaid    PT Start Time 1150   patient late   PT Stop Time 1230    PT Time Calculation (min) 40 min    Equipment Utilized During Treatment Other (comment)   crutch   Activity Tolerance Patient tolerated treatment well    Behavior During Therapy WFL for tasks assessed/performed             Past Medical History:  Diagnosis Date   Anxiety    Asthma    Depression    Herpes    Polycystic ovaries    Past Surgical History:  Procedure Laterality Date   ANKLE RECONSTRUCTION Right 10/28/2021   Procedure: RIGHT ANTERIOR TALOFIBULAR LIGAMENT REPAIR AND SYNDESMOSIS FIXATION;  Surgeon: Nadara Mustard, MD;  Location: [facility];  Service: Orthopedics;  Laterality: Right;  regional block   CYSTECTOMY     Patient Active Problem List   Diagnosis Date Noted   Sprain of anterior talofibular ligament of right ankle    Syndesmotic disruption of right ankle    Head trauma, sequela 06/03/2021   New daily persistent headache 06/03/2021   Dizziness and giddiness 06/03/2021   Bipolar disorder (HCC) 10/11/2018   MDD (major depressive disorder) 10/10/2018   Intentional drug overdose (HCC) 10/09/2018   Hypokalemia 10/09/2018   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   Palpitations 11/09/2017   Chest pain 11/09/2017   Cigarette smoker 11/09/2017   Severe major depression without psychotic features (HCC) 02/28/2017   MDD (major depressive disorder), recurrent, severe, 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   Suicide attempt (HCC) 02/28/2017   Anxiety state 02/28/2017   Insomnia 02/28/2017   Herpes 11/12/2016   Irregular  menstruation 07/06/2016    REFERRING DIAG: J18.841Y (ICD-10-CM) - Sprain of anterior talofibular ligament of right ankle, initial encounter S93.491A (ICD-10-CM) - High ankle sprain of right lower extremity, initial encounter   THERAPY DIAG:  Difficulty in walking, not elsewhere classified  Decreased range of motion of right ankle  Rationale for Evaluation and Treatment Rehabilitation  PERTINENT HISTORY: anxiety, asthma, depression, previous suicide attempt  PRECAUTIONS: fall  SUBJECTIVE: Patient reports doing well. Has been walking on her toes outside of the boot, but states it hurts when she weight bears through her heel.   PAIN:  Are you having pain? Yes: NPRS scale: 4/10 Pain location: R ankle    TODAY'S TREATMENT:  Therex:  -HEP (see below)  Manual therapy:  -scar mobilization x10 mins   Gait: -pre-gait weight shifting on R LE using B UE support on RW -progress to gait x88ft using U axillary crutch   PATIENT EDUCATION: Education details: HEP, scar mobilization  Person educated: Patient Education method: Explanation Education comprehension: verbalized understanding     HOME EXERCISE PROGRAM: Access Code: [email or web address] URL: [email or web address] Date: 12/16/2021 Prepared by: Merry Lofty  Exercises - Seated Ankle Alphabet  - 1 x daily - 7 x weekly - 3 sets - 10 reps - Towel Scrunches  - 1 x daily - 7 x weekly - 3 sets - 10 reps - Long Sitting Ankle Eversion with Resistance  - 1 x daily -  7 x weekly - 3 sets - 10 reps - Long Sitting Ankle Plantar Flexion with Resistance  - 1 x daily - 7 x weekly - 3 sets - 10 reps - Long Sitting Ankle Inversion with Resistance  - 1 x daily - 7 x weekly - 3 sets - 10 reps - Long Sitting Ankle Dorsiflexion with Anchored Resistance  - 1 x daily - 7 x weekly - 3 sets - 10 reps     GOALS: Goals reviewed with patient? Yes   SHORT TERM GOALS: Target date: 12/29/2021   Pt will be independent with initial HEP for  improved ankle ROM and strength  Baseline: to be provided Goal status: INITIAL     LONG TERM GOALS: Target date: 01/19/2022   Pt will be independent with final  HEP for improved ankle ROM and strength   Baseline: to be provided Goal status: INITIAL   2.  Patient will achieve neutral dorsiflexion in R ankle AROM Baseline: -18* Goal status: INITIAL   3.  Patient will tolerate walking household distances outside of CAM boot Baseline: unable to at this time Goal status: INITIAL   4.  Patient will improve LEFS score to >/= 61 Baseline: 51.25 Goal status: INITIAL     ASSESSMENT:   CLINICAL IMPRESSION: Patient seen for skilled PT services with emphasis on HEP development and gait tx outside of CAM boot. Patient tolerating therex well with minimal exacerbation in her pain. She was able to shift ~50-60% (self reported) of her weight onto her R LE with no increase in pain outside of CAM boot. Continue POC.     OBJECTIVE IMPAIRMENTS Abnormal gait, decreased activity tolerance, decreased balance, decreased knowledge of condition, decreased knowledge of use of DME, decreased mobility, difficulty walking, decreased ROM, decreased strength, increased edema, impaired sensation, and pain.    ACTIVITY LIMITATIONS carrying, lifting, standing, squatting, stairs, transfers, bathing, and locomotion level   PARTICIPATION LIMITATIONS: cleaning, laundry, interpersonal relationship, driving, shopping, community activity, occupation, and yard work   PERSONAL FACTORS Behavior pattern, Past/current experiences, Social background, Time since onset of injury/illness/exacerbation, and 1-2 comorbidities: anxiety, depression  are also affecting patient's functional outcome.    REHAB POTENTIAL: Good   CLINICAL DECISION MAKING: Stable/uncomplicated   EVALUATION COMPLEXITY: Low   PLAN: PT FREQUENCY: 1x/week   PT DURATION: 6 weeks   PLANNED INTERVENTIONS: Therapeutic exercises, Therapeutic activity,  Neuromuscular re-education, Balance training, Gait training, Patient/Family education, Joint mobilization, Stair training, Vestibular training, Visual/preceptual remediation/compensation, Orthotic/Fit training, DME instructions, Aquatic Therapy, Dry Needling, Electrical stimulation, Cryotherapy, Moist heat, Manual lymph drainage, Compression bandaging, scar mobilization, Splintting, Taping, Vasopneumatic device, Manual therapy, and Re-evaluation   PLAN FOR NEXT SESSION: gait outside boot using single crutch-> no crutch, ankle AROM/strength      Westley Foots, PT Westley Foots, PT, DPT, CBIS  12/16/2021, 12:51 PM

## 2021-12-23 ENCOUNTER — Ambulatory Visit: Payer: Worker's Compensation | Admitting: Physical Therapy

## 2021-12-23 ENCOUNTER — Encounter: Payer: Self-pay | Admitting: Physical Therapy

## 2021-12-23 DIAGNOSIS — R262 Difficulty in walking, not elsewhere classified: Secondary | ICD-10-CM | POA: Diagnosis not present

## 2021-12-23 DIAGNOSIS — M25671 Stiffness of right ankle, not elsewhere classified: Secondary | ICD-10-CM

## 2021-12-23 NOTE — Therapy (Unsigned)
OUTPATIENT PHYSICAL THERAPY TREATMENT NOTE   Patient Name: Jenna Henry MRN: [id number] DOB:Jun 16, 1995, 26 y.o., female Today's Date: 12/23/2021  PCP: Bertram Denver, NP  REFERRING PROVIDER: Aldean Baker, MD  END OF SESSION:   PT End of Session - 12/23/21 1534     Number of Visits 7    Date for PT Re-Evaluation 01/19/22    Authorization Type Workers Comp primary; does have Medicaid    Authorization Time Period 6 visits approved on 12/12/2021    Authorization - Visit Number 3    Authorization - Number of Visits 6    PT Start Time 1533    PT Stop Time 1615    PT Time Calculation (min) 42 min    Equipment Utilized During Treatment Other (comment)   crutch   Activity Tolerance Patient tolerated treatment well    Behavior During Therapy WFL for tasks assessed/performed             Past Medical History:  Diagnosis Date   Anxiety    Asthma    Depression    Herpes    Polycystic ovaries    Past Surgical History:  Procedure Laterality Date   ANKLE RECONSTRUCTION Right 10/28/2021   Procedure: RIGHT ANTERIOR TALOFIBULAR LIGAMENT REPAIR AND SYNDESMOSIS FIXATION;  Surgeon: Nadara Mustard, MD;  Location: [facility];  Service: Orthopedics;  Laterality: Right;  regional block   CYSTECTOMY     Patient Active Problem List   Diagnosis Date Noted   Sprain of anterior talofibular ligament of right ankle    Syndesmotic disruption of right ankle    Head trauma, sequela 06/03/2021   New daily persistent headache 06/03/2021   Dizziness and giddiness 06/03/2021   Bipolar disorder (HCC) 10/11/2018   MDD (major depressive disorder) 10/10/2018   Intentional drug overdose (HCC) 10/09/2018   Hypokalemia 10/09/2018   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   Palpitations 11/09/2017   Chest pain 11/09/2017   Cigarette smoker 11/09/2017   Severe major depression without psychotic features (HCC) 02/28/2017   MDD (major depressive disorder), recurrent, severe, with psychosis (HCC)  02/28/2017   Suicide attempt (HCC) 02/28/2017   Anxiety state 02/28/2017   Insomnia 02/28/2017   Herpes 11/12/2016   Irregular menstruation 07/06/2016    REFERRING DIAG: O17.510C (ICD-10-CM) - Sprain of anterior talofibular ligament of right ankle, initial encounter S93.491A (ICD-10-CM) - High ankle sprain of right lower extremity, initial encounter   THERAPY DIAG:  Difficulty in walking, not elsewhere classified  Decreased range of motion of right ankle  Rationale for Evaluation and Treatment Rehabilitation  PERTINENT HISTORY: anxiety, asthma, depression, previous suicide attempt  PRECAUTIONS: fall  SUBJECTIVE: No new complaints. Does feel the ex's at home have made her ankle less stiff. No falls. Has been walking around house with no boot using crutch.   PAIN:  Are you having pain? Yes: NPRS scale: 4/10 Pain location: R ankle    TODAY'S TREATMENT:  STRENGTHENING  With feet propped on both bolsters, heel hanging off edge: red band resisted PF, yellow band resisted DF, inversion and eversion x 10 reps each with manual stabilization of LE/ankle to decrease compensatory movements.  Seated at [facility] with bil feet on green pro stretch for heel cord stretching 30 seconds x 3 reps With fitter board in lowest setting: anterior/posterior taps, lateral taps, then circles both ways for 10 reps each with assist to stabilize ankle on board and go into further range of motion if able.   MANUAL THERAPY: Scar mobilization along both scars  with cross friction applied as well Soft tissue mobs along scars for decreased muscle tightness/decreased scar tissue adhesions    GAIT: Gait pattern: step to pattern, step through pattern, decreased step length- Right, decreased step length- Left, decreased stance time- Right, decreased stride length, and antalgic Distance walked: 115 x 2 Assistive device utilized: Crutches Level of assistance: SBA Comments: pt using 3 point gait pattern with bil  axillary crutches. With cues able to progress from step to pattern to step through gait pattern. ~50/50 weight on right foot/crutches with right stance      PATIENT EDUCATION: Education details: continue with current HEP;  Person educated: Patient Education method: Explanation Education comprehension: verbalized understanding     HOME EXERCISE PROGRAM: Access Code: [email or web address] URL: [email or web address] Date: 12/16/2021 Prepared by: Merry Lofty  Exercises - Seated Ankle Alphabet  - 1 x daily - 7 x weekly - 3 sets - 10 reps - Towel Scrunches  - 1 x daily - 7 x weekly - 3 sets - 10 reps - Long Sitting Ankle Eversion with Resistance  - 1 x daily - 7 x weekly - 3 sets - 10 reps - Long Sitting Ankle Plantar Flexion with Resistance  - 1 x daily - 7 x weekly - 3 sets - 10 reps - Long Sitting Ankle Inversion with Resistance  - 1 x daily - 7 x weekly - 3 sets - 10 reps - Long Sitting Ankle Dorsiflexion with Anchored Resistance  - 1 x daily - 7 x weekly - 3 sets - 10 reps     GOALS: Goals reviewed with patient? Yes   SHORT TERM GOALS: Target date: 12/29/2021   Pt will be independent with initial HEP for improved ankle ROM and strength  Baseline: to be provided Goal status: INITIAL     LONG TERM GOALS: Target date: 01/19/2022   Pt will be independent with final  HEP for improved ankle ROM and strength   Baseline: to be provided Goal status: INITIAL   2.  Patient will achieve neutral dorsiflexion in R ankle AROM Baseline: -18* Goal status: INITIAL   3.  Patient will tolerate walking household distances outside of CAM boot Baseline: unable to at this time Goal status: INITIAL   4.  Patient will improve LEFS score to >/= 61 Baseline: 51.25 Goal status: INITIAL     ASSESSMENT:   CLINICAL IMPRESSION: Today's skilled session continue to focus on right ankle ROM/strengthening and gait with shoe vs boot using bil crutches. No issues noted or reported in session.  The pt is making steady progress toward goals and should benefit from continued PT to progress toward unmet goals.      OBJECTIVE IMPAIRMENTS Abnormal gait, decreased activity tolerance, decreased balance, decreased knowledge of condition, decreased knowledge of use of DME, decreased mobility, difficulty walking, decreased ROM, decreased strength, increased edema, impaired sensation, and pain.    ACTIVITY LIMITATIONS carrying, lifting, standing, squatting, stairs, transfers, bathing, and locomotion level   PARTICIPATION LIMITATIONS: cleaning, laundry, interpersonal relationship, driving, shopping, community activity, occupation, and yard work   PERSONAL FACTORS Behavior pattern, Past/current experiences, Social background, Time since onset of injury/illness/exacerbation, and 1-2 comorbidities: anxiety, depression  are also affecting patient's functional outcome.    REHAB POTENTIAL: Good   CLINICAL DECISION MAKING: Stable/uncomplicated   EVALUATION COMPLEXITY: Low   PLAN: PT FREQUENCY: 1x/week   PT DURATION: 6 weeks   PLANNED INTERVENTIONS: Therapeutic exercises, Therapeutic activity, Neuromuscular re-education, Balance training, Gait training, Patient/Family education, Joint mobilization, Stair training, Vestibular  training, Visual/preceptual remediation/compensation, Orthotic/Fit training, DME instructions, Aquatic Therapy, Dry Needling, Electrical stimulation, Cryotherapy, Moist heat, Manual lymph drainage, Compression bandaging, scar mobilization, Splintting, Taping, Vasopneumatic device, Manual therapy, and Re-evaluation   PLAN FOR NEXT SESSION:  gait outside boot using single crutch-> no crutch, ankle AROM/strength      Sallyanne Kuster, PTA, [facility] [address] [place], [place] [address] [phone] 12/23/21, 8:43 PM

## 2021-12-25 ENCOUNTER — Encounter: Payer: Self-pay | Admitting: Orthopedic Surgery

## 2021-12-25 ENCOUNTER — Ambulatory Visit (INDEPENDENT_AMBULATORY_CARE_PROVIDER_SITE_OTHER): Payer: PRIVATE HEALTH INSURANCE | Admitting: Orthopedic Surgery

## 2021-12-25 DIAGNOSIS — S93491A Sprain of other ligament of right ankle, initial encounter: Secondary | ICD-10-CM

## 2021-12-25 NOTE — Progress Notes (Signed)
Office Visit Note   Patient: Jenna Henry           Date of Birth: [birth year]           MRN: [id number] Visit Date: 12/25/2021              Requested by: Claiborne Rigg, NP [address] [place] [address] [place],  [place] [address] PCP: Claiborne Rigg, NP  Chief Complaint  Patient presents with   Right Ankle - Routine Post Op    10/28/2021 right ankle ATFL repair and syndesmosis fixation       HPI: Patient is a 26 year old woman who is 2 months status post right ankle anterior talofibular ligament reconstruction and syndesmosis repair.  Patient has been doing therapy at neuro rehab she is currently ambulating in a fracture boot without crutches.  Assessment & Plan: Visit Diagnoses:  1. Sprain of anterior talofibular ligament of right ankle, initial encounter   2. High ankle sprain of right lower extremity, initial encounter     Plan: Patient will continue with therapy continue with dorsiflexion stretching of the ankle anticipate advancing her to an ASO at follow-up  Follow-Up Instructions: Return in about 4 weeks (around 01/22/2022).   Ortho Exam  Patient is alert, oriented, no adenopathy, well-dressed, normal affect, normal respiratory effort. Examination patient does have swelling she has dorsiflexion to neutral and was demonstrated stretching.  Anterior drawer is stable there is no cellulitis no drainage no signs of infection there is no pain with range of motion.  Imaging: No results found. No images are attached to the encounter.  Labs: Lab Results  Component Value Date   HGBA1C 5.0 10/11/2018   HGBA1C 5.2 09/24/2017   HGBA1C 5.1 02/28/2017   REPTSTATUS 12/12/2019 FINAL 12/11/2019   CULT (A) 12/11/2019    <10,000 COLONIES/mL INSIGNIFICANT GROWTH Performed at [facility], [address] [place], [place] [address]      Lab Results  Component Value Date   ALBUMIN 4.0 11/08/2020   ALBUMIN 3.9 12/14/2019   ALBUMIN 4.2 12/03/2019    Lab Results   Component Value Date   MG 2.1 10/09/2018   MG 2.2 09/24/2017   Lab Results  Component Value Date   VD25OH 22 (L) 06/05/2016    No results found for: "PREALBUMIN"    Latest Ref Rng & Units 05/16/2021   11:59 AM 11/19/2020    9:33 AM 11/08/2020   11:33 AM  CBC EXTENDED  WBC 4.0 - 10.5 K/uL 5.8  8.0  6.7   RBC 3.87 - 5.11 MIL/uL 4.23  4.37  4.59   Hemoglobin 12.0 - 15.0 g/dL 29.7  98.9  21.1   HCT 36.0 - 46.0 % 42.4  42.2  44.7   Platelets 150 - 400 K/uL 258  229  244      There is no height or weight on file to calculate BMI.  Orders:  No orders of the defined types were placed in this encounter.  No orders of the defined types were placed in this encounter.    Procedures: No procedures performed  Clinical Data: No additional findings.  ROS:  All other systems negative, except as noted in the HPI. Review of Systems  Objective: Vital Signs: There were no vitals taken for this visit.  Specialty Comments:  No specialty comments available.  PMFS History: Patient Active Problem List   Diagnosis Date Noted   Sprain of anterior talofibular ligament of right ankle    Syndesmotic  disruption of right ankle    Head trauma, sequela 06/03/2021   New daily persistent headache 06/03/2021   Dizziness and giddiness 06/03/2021   Bipolar disorder (HCC) 10/11/2018   MDD (major depressive disorder) 10/10/2018   Intentional drug overdose (HCC) 10/09/2018   Hypokalemia 10/09/2018   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   Palpitations 11/09/2017   Chest pain 11/09/2017   Cigarette smoker 11/09/2017   Severe major depression without psychotic features (HCC) 02/28/2017   MDD (major depressive disorder), recurrent, severe, 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   Suicide attempt (HCC) 02/28/2017   Anxiety state 02/28/2017   Insomnia 02/28/2017   Herpes 11/12/2016   Irregular menstruation 07/06/2016   Past Medical History:  Diagnosis Date   Anxiety    Asthma    Depression     Herpes    Polycystic ovaries     Family History  Problem Relation Age of Onset   Hypertension Mother    Anxiety disorder Mother    Heart murmur Father    Heart disease Maternal Grandfather    Diabetes Paternal Grandmother    Cancer Paternal Grandmother     Past Surgical History:  Procedure Laterality Date   ANKLE RECONSTRUCTION Right 10/28/2021   Procedure: RIGHT ANTERIOR TALOFIBULAR LIGAMENT REPAIR AND SYNDESMOSIS FIXATION;  Surgeon: Nadara Mustard, MD;  Location: [facility];  Service: Orthopedics;  Laterality: Right;  regional block   CYSTECTOMY     Social History   Occupational History   Not on file  Tobacco Use   Smoking status: Former    Packs/day: 0.50    Types: Cigarettes   Smokeless tobacco: Never   Tobacco comments:    4 cigarettes per day  Vaping Use   Vaping Use: Every day   Substances: Nicotine, Flavoring  Substance and Sexual Activity   Alcohol use: Yes    Alcohol/week: 2.0 standard drinks of alcohol    Types: 2 Cans of beer per week    Comment: occassional   Drug use: Yes    Types: Marijuana    Comment: daily   Sexual activity: Yes    Birth control/protection: None

## 2021-12-30 ENCOUNTER — Ambulatory Visit: Payer: Worker's Compensation | Attending: Orthopedic Surgery

## 2021-12-30 DIAGNOSIS — M25671 Stiffness of right ankle, not elsewhere classified: Secondary | ICD-10-CM | POA: Insufficient documentation

## 2021-12-30 DIAGNOSIS — R262 Difficulty in walking, not elsewhere classified: Secondary | ICD-10-CM | POA: Insufficient documentation

## 2022-01-07 ENCOUNTER — Ambulatory Visit: Payer: PRIVATE HEALTH INSURANCE | Attending: Nurse Practitioner

## 2022-01-07 DIAGNOSIS — R262 Difficulty in walking, not elsewhere classified: Secondary | ICD-10-CM | POA: Diagnosis present

## 2022-01-07 DIAGNOSIS — M25671 Stiffness of right ankle, not elsewhere classified: Secondary | ICD-10-CM | POA: Diagnosis present

## 2022-01-07 NOTE — Therapy (Signed)
OUTPATIENT PHYSICAL THERAPY TREATMENT NOTE   Patient Name: Jenna Henry MRN: [id number] DOB:[birth year], 26 y.o., female Today's Date: 01/07/2022  PCP: Geryl Rankins, NP  REFERRING PROVIDER: Meridee Score, MD  END OF SESSION:   PT End of Session - 01/07/22 1314     Visit Number 4    Number of Visits 7    Date for PT Re-Evaluation 01/19/22    Authorization Type Workers Comp primary; does have Medicaid    Authorization Time Period 6 visits approved on 12/12/2021    Authorization - Visit Number 3    Authorization - Number of Visits 6    PT Start Time 1315    PT Stop Time 1357    PT Time Calculation (min) 42 min    Equipment Utilized During Treatment Gait belt    Activity Tolerance Patient tolerated treatment well    Behavior During Therapy WFL for tasks assessed/performed             Past Medical History:  Diagnosis Date   Anxiety    Asthma    Depression    Herpes    Polycystic ovaries    Past Surgical History:  Procedure Laterality Date   ANKLE RECONSTRUCTION Right 10/28/2021   Procedure: RIGHT ANTERIOR TALOFIBULAR LIGAMENT REPAIR AND SYNDESMOSIS FIXATION;  Surgeon: Newt Minion, MD;  Location: [facility];  Service: Orthopedics;  Laterality: Right;  regional block   CYSTECTOMY     Patient Active Problem List   Diagnosis Date Noted   Sprain of anterior talofibular ligament of right ankle    Syndesmotic disruption of right ankle    Head trauma, sequela 06/03/2021   New daily persistent headache 06/03/2021   Dizziness and giddiness 06/03/2021   Bipolar disorder ([place]) 10/11/2018   MDD (major depressive disorder) 10/10/2018   Intentional drug overdose ([place]) 10/09/2018   Hypokalemia 10/09/2018   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   Palpitations 11/09/2017   Chest pain 11/09/2017   Cigarette smoker 11/09/2017   Severe major depression without psychotic features ([place]) 02/28/2017   MDD (major depressive disorder), recurrent, severe, with psychosis  ([place]) 02/28/2017   Suicide attempt ([place]) 02/28/2017   Anxiety state 02/28/2017   Insomnia 02/28/2017   Herpes 11/12/2016   Irregular menstruation 07/06/2016    REFERRING DIAG: C94.496P (ICD-10-CM) - Sprain of anterior talofibular ligament of right ankle, initial encounter S93.491A (ICD-10-CM) - High ankle sprain of right lower extremity, initial encounter   THERAPY DIAG:  Difficulty in walking, not elsewhere classified  Decreased range of motion of right ankle  Rationale for Evaluation and Treatment Rehabilitation  PERTINENT HISTORY: anxiety, asthma, depression, previous suicide attempt  PRECAUTIONS: fall  SUBJECTIVE: Patient reports doing well. Has been trying to walk outside CAM boot. States that it feels as though her R knee gives sometimes   PAIN:  Are you having pain? Yes: NPRS scale: 5/10 Pain location: R ankle    TODAY'S TREATMENT: GAIT: -115' single crutch -115' x2 no AD (heel contact  Therex: -step down off 2" box to force dorsiflexion and weight bearing  -foam airex L foot toe taping for improved ankle strategy  -rockerboard A/P weight shifting  -rockerboard lateral weight shifting  -standing heel raises 2x12 -ankle alphabet    PATIENT EDUCATION: Education details: continue with current HEP;  Person educated: Patient Education method: Explanation Education comprehension: verbalized understanding     HOME EXERCISE PROGRAM: Access Code: [email or web address] URL: [email or web address] Date: 12/16/2021 Prepared by: Estevan Ryder  Exercises - Seated Ankle Alphabet  - 1  x daily - 7 x weekly - 3 sets - 10 reps - Towel Scrunches  - 1 x daily - 7 x weekly - 3 sets - 10 reps - Long Sitting Ankle Eversion with Resistance  - 1 x daily - 7 x weekly - 3 sets - 10 reps - Long Sitting Ankle Plantar Flexion with Resistance  - 1 x daily - 7 x weekly - 3 sets - 10 reps - Long Sitting Ankle Inversion with Resistance  - 1 x daily - 7 x weekly - 3 sets - 10 reps -  Long Sitting Ankle Dorsiflexion with Anchored Resistance  - 1 x daily - 7 x weekly - 3 sets - 10 reps     GOALS: Goals reviewed with patient? Yes   SHORT TERM GOALS: Target date: 12/29/2021   Pt will be independent with initial HEP for improved ankle ROM and strength  Baseline: provided Goal status: MET     LONG TERM GOALS: Target date: 01/19/2022   Pt will be independent with final  HEP for improved ankle ROM and strength   Baseline: to be provided Goal status: INITIAL   2.  Patient will achieve neutral dorsiflexion in R ankle AROM Baseline: -18* Goal status: INITIAL   3.  Patient will tolerate walking household distances outside of CAM boot Baseline: unable to at this time Goal status: INITIAL   4.  Patient will improve LEFS score to >/= 61 Baseline: 51.25 Goal status: INITIAL     ASSESSMENT:   CLINICAL IMPRESSION: Patient seen for skilled PT session with emphasis on gait re-training and strengthening of R ankle. Patient tolerating gait outside of CAM boot and no AD well with emphasis on heel contact and controlling into PF. Demonstration appropriate ankle strategy on foam given very small balance perturbations. Continue POC.    OBJECTIVE IMPAIRMENTS Abnormal gait, decreased activity tolerance, decreased balance, decreased knowledge of condition, decreased knowledge of use of DME, decreased mobility, difficulty walking, decreased ROM, decreased strength, increased edema, impaired sensation, and pain.    ACTIVITY LIMITATIONS carrying, lifting, standing, squatting, stairs, transfers, bathing, and locomotion level   PARTICIPATION LIMITATIONS: cleaning, laundry, interpersonal relationship, driving, shopping, community activity, occupation, and yard work   PERSONAL FACTORS Behavior pattern, Past/current experiences, Social background, Time since onset of injury/illness/exacerbation, and 1-2 comorbidities: anxiety, depression  are also affecting patient's functional outcome.     REHAB POTENTIAL: Good   CLINICAL DECISION MAKING: Stable/uncomplicated   EVALUATION COMPLEXITY: Low   PLAN: PT FREQUENCY: 1x/week   PT DURATION: 6 weeks   PLANNED INTERVENTIONS: Therapeutic exercises, Therapeutic activity, Neuromuscular re-education, Balance training, Gait training, Patient/Family education, Joint mobilization, Stair training, Vestibular training, Visual/preceptual remediation/compensation, Orthotic/Fit training, DME instructions, Aquatic Therapy, Dry Needling, Electrical stimulation, Cryotherapy, Moist heat, Manual lymph drainage, Compression bandaging, scar mobilization, Splintting, Taping, Vasopneumatic device, Manual therapy, and Re-evaluation   PLAN FOR NEXT SESSION: Gait with no AD and no boot. Did she bring sneakers? Ankle strategy/gait on compliant surfaces    Debbora Dus, PT, DPT, CBIS  01/07/22, 1:59 PM

## 2022-01-12 ENCOUNTER — Telehealth: Payer: Self-pay | Admitting: Orthopedic Surgery

## 2022-01-12 NOTE — Telephone Encounter (Signed)
[employer] rep needed a work note attached to her last OV note. This has been written and in chart. Morrison Old will send off to them.

## 2022-01-13 ENCOUNTER — Encounter: Payer: Self-pay | Admitting: Physical Therapy

## 2022-01-13 ENCOUNTER — Ambulatory Visit: Payer: PRIVATE HEALTH INSURANCE | Admitting: Physical Therapy

## 2022-01-13 DIAGNOSIS — R262 Difficulty in walking, not elsewhere classified: Secondary | ICD-10-CM

## 2022-01-13 DIAGNOSIS — M25671 Stiffness of right ankle, not elsewhere classified: Secondary | ICD-10-CM

## 2022-01-13 NOTE — Therapy (Signed)
OUTPATIENT PHYSICAL THERAPY TREATMENT NOTE   Patient Name: Jenna Henry MRN: [id number] DOB:February 10, 1996, 26 y.o., female Today's Date: 01/13/2022  PCP: Geryl Rankins, NP  REFERRING PROVIDER: Meridee Score, MD  END OF SESSION:   PT End of Session - 01/13/22 1451     Visit Number 5    Number of Visits 7    Date for PT Re-Evaluation 01/19/22    Authorization Type Workers Comp primary; does have Medicaid    Authorization Time Period 6 visits approved on 12/12/2021    Authorization - Visit Number 4    Authorization - Number of Visits 6    PT Start Time 1450   Pt requesting to go to car to get other shoe when PT enters lobby to bring pt back   PT Stop Time 1533    PT Time Calculation (min) 43 min    Equipment Utilized During Treatment Gait belt    Activity Tolerance Patient tolerated treatment well    Behavior During Therapy WFL for tasks assessed/performed             Past Medical History:  Diagnosis Date   Anxiety    Asthma    Depression    Herpes    Polycystic ovaries    Past Surgical History:  Procedure Laterality Date   ANKLE RECONSTRUCTION Right 10/28/2021   Procedure: RIGHT ANTERIOR TALOFIBULAR LIGAMENT REPAIR AND SYNDESMOSIS FIXATION;  Surgeon: Newt Minion, MD;  Location: [facility];  Service: Orthopedics;  Laterality: Right;  regional block   CYSTECTOMY     Patient Active Problem List   Diagnosis Date Noted   Sprain of anterior talofibular ligament of right ankle    Syndesmotic disruption of right ankle    Head trauma, sequela 06/03/2021   New daily persistent headache 06/03/2021   Dizziness and giddiness 06/03/2021   Bipolar disorder ([place]) 10/11/2018   MDD (major depressive disorder) 10/10/2018   Intentional drug overdose ([place]) 10/09/2018   Hypokalemia 10/09/2018   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   Palpitations 11/09/2017   Chest pain 11/09/2017   Cigarette smoker 11/09/2017   Severe major depression without psychotic features ([place])  02/28/2017   MDD (major depressive disorder), recurrent, severe, with psychosis ([place]) 02/28/2017   Suicide attempt ([place]) 02/28/2017   Anxiety state 02/28/2017   Insomnia 02/28/2017   Herpes 11/12/2016   Irregular menstruation 07/06/2016    REFERRING DIAG: E49.753Y (ICD-10-CM) - Sprain of anterior talofibular ligament of right ankle, initial encounter S93.491A (ICD-10-CM) - High ankle sprain of right lower extremity, initial encounter   THERAPY DIAG:  Difficulty in walking, not elsewhere classified  Decreased range of motion of right ankle  Rationale for Evaluation and Treatment Rehabilitation  PERTINENT HISTORY: anxiety, asthma, depression, previous suicide attempt  PRECAUTIONS: fall  SUBJECTIVE: Pt states she her MD again on 8/28.  She states she has not received the lace-up brace.  She states she continues trying to walk without the CAM boot and goes about 50-60% of the time without it at home.  She wears it to places like the grocery store.  PAIN:  Are you having pain? Yes: NPRS scale: 7/10 Pain location: R ankle   Achy  TODAY'S TREATMENT: Pt doffs CAM boot and dons right tennis shoe independently.  Pt sit scooted to EOM w/ symmetric bilateral pressure through feet to promote low load long-duration stretch x41mns.  GAIT: Gait pattern: step through pattern, decreased arm swing- Right, decreased arm swing- Left, decreased step length- Right, decreased stride length, and decreased  ankle dorsiflexion- Right R foot hit the grounds in mild inversion Distance walked: 460' Assistive device utilized: None Level of assistance: SBA Comments: Pt has mild discomfort in right ankle throughout gait.  She ambulates at decreased pace and w/ dec heel strike on right.  Toe off is mildly diminished likely due to footwear (air forces).  She loses balance to left x 2, but self-corrects w/ small step.  RAMP:  Level of Assistance: SBA Assistive device utilized: None Ramp Comments: Pt ambulates  up and down ramp x3 w/ distant supervision and cuing to promote weight shifting to right during ascent allowing ankle stretch and maintaining toe on ground to promote contralateral limb advancement without LOB.  STAIRS:  Level of Assistance: SBA  Stair Negotiation Technique: Step to Pattern Forwards with Single Rail on Left  Number of Stairs: 8   Height of Stairs: 6"  Comments: Edu to patient on up with the good (LLE) and down with the bad (RLE).  Pt maintains RLE in extended position demonstrating mild circumduction on ascent likely due to reported fear of pain.  Pt reports no discomfort at completion of task.  She appears safe with technique with only distant supervision, no LOB.  Closed chain anterior weight shift on second stair with RLE leading using bilateral rails for support x10 to promote ankle mobilization into DF.  PATIENT EDUCATION: Education details: PT discusses ROM related to surgical fixation and changes she may feel with gait temporarily.  Answered questions related to injury/surgery as appropriate.  Continue with current HEP and progressing out of the CAM boot to regular shoes to promote tolerance.  Encouraged walking with normal tennis shoes and progressing walking tolerance on level surface before progressing outside.   Person educated: Patient Education method: Explanation Education comprehension: verbalized understanding     HOME EXERCISE PROGRAM: Access Code: [email or web address] URL: [email or web address] Date: 12/16/2021 Prepared by: Estevan Ryder  Exercises - Seated Ankle Alphabet  - 1 x daily - 7 x weekly - 3 sets - 10 reps - Towel Scrunches  - 1 x daily - 7 x weekly - 3 sets - 10 reps - Long Sitting Ankle Eversion with Resistance  - 1 x daily - 7 x weekly - 3 sets - 10 reps - Long Sitting Ankle Plantar Flexion with Resistance  - 1 x daily - 7 x weekly - 3 sets - 10 reps - Long Sitting Ankle Inversion with Resistance  - 1 x daily - 7 x weekly - 3 sets - 10  reps - Long Sitting Ankle Dorsiflexion with Anchored Resistance  - 1 x daily - 7 x weekly - 3 sets - 10 reps     GOALS: Goals reviewed with patient? Yes   SHORT TERM GOALS: Target date: 12/29/2021   Pt will be independent with initial HEP for improved ankle ROM and strength  Baseline: provided Goal status: MET     LONG TERM GOALS: Target date: 01/19/2022   Pt will be independent with final  HEP for improved ankle ROM and strength   Baseline: to be provided Goal status: INITIAL   2.  Patient will achieve neutral dorsiflexion in R ankle AROM Baseline: -18* Goal status: INITIAL   3.  Patient will tolerate walking household distances outside of CAM boot Baseline: unable to at this time Goal status: INITIAL   4.  Patient will improve LEFS score to >/= 61 Baseline: 51.25 Goal status: INITIAL     ASSESSMENT:   CLINICAL IMPRESSION: Focus of skilled session on  further addressing gait training without CAM boot or AD.  She was mildly limited in quality of gait and dynamic activities by fear of anticipated pain and some hypersensitivity in the sole of the right foot.  Provided additional activities to address these issues at home.  She is anticipated to continue progressing towards LTGs with ongoing skilled PT POC.    OBJECTIVE IMPAIRMENTS Abnormal gait, decreased activity tolerance, decreased balance, decreased knowledge of condition, decreased knowledge of use of DME, decreased mobility, difficulty walking, decreased ROM, decreased strength, increased edema, impaired sensation, and pain.    ACTIVITY LIMITATIONS carrying, lifting, standing, squatting, stairs, transfers, bathing, and locomotion level   PARTICIPATION LIMITATIONS: cleaning, laundry, interpersonal relationship, driving, shopping, community activity, occupation, and yard work   PERSONAL FACTORS Behavior pattern, Past/current experiences, Social background, Time since onset of injury/illness/exacerbation, and 1-2  comorbidities: anxiety, depression  are also affecting patient's functional outcome.    REHAB POTENTIAL: Good   CLINICAL DECISION MAKING: Stable/uncomplicated   EVALUATION COMPLEXITY: Low   PLAN: PT FREQUENCY: 1x/week   PT DURATION: 6 weeks   PLANNED INTERVENTIONS: Therapeutic exercises, Therapeutic activity, Neuromuscular re-education, Balance training, Gait training, Patient/Family education, Joint mobilization, Stair training, Vestibular training, Visual/preceptual remediation/compensation, Orthotic/Fit training, DME instructions, Aquatic Therapy, Dry Needling, Electrical stimulation, Cryotherapy, Moist heat, Manual lymph drainage, Compression bandaging, scar mobilization, Splintting, Taping, Vasopneumatic device, Manual therapy, and Re-evaluation   PLAN FOR NEXT SESSION: Gait with no AD and no boot. Did she bring sneakers-she may need shoes with rounder posterior flare than air forces? Ankle strategy/gait on compliant surfaces, trial MWM on stairs if DF remains limited, squatting/lunging motions, treadmill training for gait speed and heel strike    Elease Etienne, PT, DPT  01/13/22, 3:54 PM

## 2022-01-20 ENCOUNTER — Ambulatory Visit: Payer: Worker's Compensation

## 2022-01-20 DIAGNOSIS — R262 Difficulty in walking, not elsewhere classified: Secondary | ICD-10-CM

## 2022-01-20 DIAGNOSIS — M25671 Stiffness of right ankle, not elsewhere classified: Secondary | ICD-10-CM | POA: Diagnosis present

## 2022-01-20 NOTE — Therapy (Signed)
OUTPATIENT PHYSICAL THERAPY TREATMENT NOTE/DISCHARGE SUMMARY   Patient Name: Jenna Henry MRN: [id number] DOB:1995-11-27, 26 y.o., female Today's Date: 01/20/2022  PCP: Geryl Rankins, NP  REFERRING PROVIDER: Meridee Score, MD  PHYSICAL THERAPY DISCHARGE SUMMARY  Visits from Start of Care: 6  Current functional level related to goals / functional outcomes: ModI in and out of CAM boot with no additional AD. Does still display antalgic gait pattern with some learned non-use of R LE.    Remaining deficits: Antalgic gait, learned non-use, pain in R LE, diminished balance strategies and ankle proprioception   Education / Equipment: PT POC, HEP   Patient agrees to discharge. Patient goals were partially met. Patient is being discharged due to  worker's comp visits expiring.  END OF SESSION:   PT End of Session - 01/20/22 1318     Visit Number 6    Number of Visits 7    Date for PT Re-Evaluation 01/19/22    Authorization Type Workers Comp primary; does have Medicaid    Authorization Time Period 6 visits approved on 12/12/2021    Authorization - Visit Number 4    Authorization - Number of Visits 6    PT Start Time 3086    PT Stop Time 1358    PT Time Calculation (min) 41 min    Activity Tolerance Patient tolerated treatment well    Behavior During Therapy WFL for tasks assessed/performed             Past Medical History:  Diagnosis Date   Anxiety    Asthma    Depression    Herpes    Polycystic ovaries    Past Surgical History:  Procedure Laterality Date   ANKLE RECONSTRUCTION Right 10/28/2021   Procedure: RIGHT ANTERIOR TALOFIBULAR LIGAMENT REPAIR AND SYNDESMOSIS FIXATION;  Surgeon: Newt Minion, MD;  Location: [facility];  Service: Orthopedics;  Laterality: Right;  regional block   CYSTECTOMY     Patient Active Problem List   Diagnosis Date Noted   Sprain of anterior talofibular ligament of right ankle    Syndesmotic disruption of right ankle     Head trauma, sequela 06/03/2021   New daily persistent headache 06/03/2021   Dizziness and giddiness 06/03/2021   Bipolar disorder ([place]) 10/11/2018   MDD (major depressive disorder) 10/10/2018   Intentional drug overdose ([place]) 10/09/2018   Hypokalemia 10/09/2018   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   Palpitations 11/09/2017   Chest pain 11/09/2017   Cigarette smoker 11/09/2017   Severe major depression without psychotic features ([place]) 02/28/2017   MDD (major depressive disorder), recurrent, severe, with psychosis ([place]) 02/28/2017   Suicide attempt ([place]) 02/28/2017   Anxiety state 02/28/2017   Insomnia 02/28/2017   Herpes 11/12/2016   Irregular menstruation 07/06/2016    REFERRING DIAG: V78.469G (ICD-10-CM) - Sprain of anterior talofibular ligament of right ankle, initial encounter S93.491A (ICD-10-CM) - High ankle sprain of right lower extremity, initial encounter   THERAPY DIAG:  Difficulty in walking, not elsewhere classified  Decreased range of motion of right ankle  Rationale for Evaluation and Treatment Rehabilitation  PERTINENT HISTORY: anxiety, asthma, depression, previous suicide attempt  PRECAUTIONS: fall  SUBJECTIVE: Patient reports doing well. Still primarily wearing CAM boot. Has MD appt on Monday.   PAIN:  Are you having pain? Yes: NPRS scale: 7/10 Pain location: R ankle   Achy  TODAY'S TREATMENT: THERACT: -LEFS -DF ROM -4* -discussed normalizing gait pattern; how to request more visits re: worker's comp   GAIT: -gait  outside CAM boot 115' x4 with no AD and distant spv  -4 square stepping    PATIENT EDUCATION: Education details: continue HEP, OM results,  Person educated: Patient Education method: Explanation Education comprehension: verbalized understanding     HOME EXERCISE PROGRAM: Access Code: [email or web address] URL: [email or web address] Date: 12/16/2021 Prepared by: Estevan Ryder  Exercises - Seated Ankle Alphabet  - 1 x  daily - 7 x weekly - 3 sets - 10 reps - Towel Scrunches  - 1 x daily - 7 x weekly - 3 sets - 10 reps - Long Sitting Ankle Eversion with Resistance  - 1 x daily - 7 x weekly - 3 sets - 10 reps - Long Sitting Ankle Plantar Flexion with Resistance  - 1 x daily - 7 x weekly - 3 sets - 10 reps - Long Sitting Ankle Inversion with Resistance  - 1 x daily - 7 x weekly - 3 sets - 10 reps - Long Sitting Ankle Dorsiflexion with Anchored Resistance  - 1 x daily - 7 x weekly - 3 sets - 10 reps     GOALS: Goals reviewed with patient? Yes   SHORT TERM GOALS: Target date: 12/29/2021   Pt will be independent with initial HEP for improved ankle ROM and strength  Baseline: provided Goal status: MET     LONG TERM GOALS: Target date: 01/19/2022   Pt will be independent with final  HEP for improved ankle ROM and strength   Baseline: provided Goal status: MET   2.  Patient will achieve neutral dorsiflexion in R ankle AROM Baseline: -18*; -4* Goal status: MET   3.  Patient will tolerate walking household distances outside of CAM boot Baseline: unable to at this time Goal status: INITIAL   4.  Patient will improve LEFS score to >/= 61 Baseline: 51.25; 56.25 Goal status: NOT MET     ASSESSMENT:   CLINICAL IMPRESSION: Patient seen for skilled PT session with emphasis on goal assessment and gait training outside of CAM boot. Some time of session spent coordinating need for more visits to be requested by MD to [employer]. Patient remains with moderate disability due to R LE injury and pain. She does display some learned non-use of R LE with noted delayed balance responses and hesitation completing any agility movements. Patient would benefit from further skilled PT if approved to further improve her gait mechanics to prevent further sequelae.    OBJECTIVE IMPAIRMENTS Abnormal gait, decreased activity tolerance, decreased balance, decreased knowledge of condition, decreased knowledge of use of DME,  decreased mobility, difficulty walking, decreased ROM, decreased strength, increased edema, impaired sensation, and pain.    ACTIVITY LIMITATIONS carrying, lifting, standing, squatting, stairs, transfers, bathing, and locomotion level   PARTICIPATION LIMITATIONS: cleaning, laundry, interpersonal relationship, driving, shopping, community activity, occupation, and yard work   PERSONAL FACTORS Behavior pattern, Past/current experiences, Social background, Time since onset of injury/illness/exacerbation, and 1-2 comorbidities: anxiety, depression  are also affecting patient's functional outcome.    REHAB POTENTIAL: Good   CLINICAL DECISION MAKING: Stable/uncomplicated   EVALUATION COMPLEXITY: Low   PLAN: PT FREQUENCY: 1x/week   PT DURATION: 6 weeks   PLANNED INTERVENTIONS: Therapeutic exercises, Therapeutic activity, Neuromuscular re-education, Balance training, Gait training, Patient/Family education, Joint mobilization, Stair training, Vestibular training, Visual/preceptual remediation/compensation, Orthotic/Fit training, DME instructions, Aquatic Therapy, Dry Needling, Electrical stimulation, Cryotherapy, Moist heat, Manual lymph drainage, Compression bandaging, scar mobilization, Splintting, Taping, Vasopneumatic device, Manual therapy, and Re-evaluation   PLAN FOR NEXT SESSION: Patient dc from PT  Elease Etienne, PT, DPT  01/20/22, 2:18 PM

## 2022-01-21 NOTE — Addendum Note (Signed)
Addended by: Merry Lofty A on: 01/21/2022 09:40 AM   Modules accepted: Orders

## 2022-01-26 ENCOUNTER — Ambulatory Visit (INDEPENDENT_AMBULATORY_CARE_PROVIDER_SITE_OTHER): Payer: PRIVATE HEALTH INSURANCE | Admitting: Orthopedic Surgery

## 2022-01-26 ENCOUNTER — Encounter: Payer: Self-pay | Admitting: Orthopedic Surgery

## 2022-01-26 DIAGNOSIS — S93491A Sprain of other ligament of right ankle, initial encounter: Secondary | ICD-10-CM

## 2022-01-26 NOTE — Progress Notes (Signed)
Office Visit Note   Patient: Jenna Henry           Date of Birth: [birth year]           MRN: [id number] Visit Date: 01/26/2022              Requested by: Claiborne Rigg, NP [address] [place] [address] [place],  [place] [address] PCP: Claiborne Rigg, NP  Chief Complaint  Patient presents with   Right Ankle - Routine Post Op    10/28/2021 right ankle ATFL repair and syndesmosis fixation        HPI: Patient is a 26 year old woman who is 3 months status post repair of the anterior talofibular ligament and syndesmosis fixation.  Patient states she went to physical therapy for 6 visits as approved by [employer].  Patient is currently ambulating in a fracture boot.  Assessment & Plan: Visit Diagnoses:  1. Sprain of anterior talofibular ligament of right ankle, initial encounter   2. High ankle sprain of right lower extremity, initial encounter     Plan: We will transition to an ASO she states she would like to work on therapy on her own.  She does not feel safe returning to work at this time we will provide a note to keep her out of work for 4 weeks and evaluate for return to work at follow-up.  Follow-Up Instructions: Return in about 4 weeks (around 02/23/2022).   Ortho Exam  Patient is alert, oriented, no adenopathy, well-dressed, normal affect, normal respiratory effort. Examination the incisions are well-healed anterior drawer is stable there is no laxity.  There is no redness no cellulitis no signs of infection.  Imaging: No results found. No images are attached to the encounter.  Labs: Lab Results  Component Value Date   HGBA1C 5.0 10/11/2018   HGBA1C 5.2 09/24/2017   HGBA1C 5.1 02/28/2017   REPTSTATUS 12/12/2019 FINAL 12/11/2019   CULT (A) 12/11/2019    <10,000 COLONIES/mL INSIGNIFICANT GROWTH Performed at [facility], [address], [place], [place] [address]      Lab Results  Component Value Date   ALBUMIN 4.0 11/08/2020   ALBUMIN 3.9  12/14/2019   ALBUMIN 4.2 12/03/2019    Lab Results  Component Value Date   MG 2.1 10/09/2018   MG 2.2 09/24/2017   Lab Results  Component Value Date   VD25OH 22 (L) 06/05/2016    No results found for: "PREALBUMIN"    Latest Ref Rng & Units 05/16/2021   11:59 AM 11/19/2020    9:33 AM 11/08/2020   11:33 AM  CBC EXTENDED  WBC 4.0 - 10.5 K/uL 5.8  8.0  6.7   RBC 3.87 - 5.11 MIL/uL 4.23  4.37  4.59   Hemoglobin 12.0 - 15.0 g/dL 86.7  67.2  09.4   HCT 36.0 - 46.0 % 42.4  42.2  44.7   Platelets 150 - 400 K/uL 258  229  244      There is no height or weight on file to calculate BMI.  Orders:  No orders of the defined types were placed in this encounter.  No orders of the defined types were placed in this encounter.    Procedures: No procedures performed  Clinical Data: No additional findings.  ROS:  All other systems negative, except as noted in the HPI. Review of Systems  Objective: Vital Signs: There were no vitals taken for this visit.  Specialty Comments:  No specialty comments available.  PMFS History: Patient Active Problem List   Diagnosis Date Noted   Sprain of anterior talofibular ligament of right ankle    Syndesmotic disruption of right ankle    Head trauma, sequela 06/03/2021   New daily persistent headache 06/03/2021   Dizziness and giddiness 06/03/2021   Bipolar disorder ([facility]) 10/11/2018   MDD (major depressive disorder) 10/10/2018   Intentional drug overdose ([facility]) 10/09/2018   Hypokalemia 10/09/2018   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   Palpitations 11/09/2017   Chest pain 11/09/2017   Cigarette smoker 11/09/2017   Severe major depression without psychotic features ([facility]) 02/28/2017   MDD (major depressive disorder), recurrent, severe, 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   Suicide attempt ([facility]) 02/28/2017   Anxiety state 02/28/2017   Insomnia 02/28/2017   Herpes 11/12/2016   Irregular menstruation 07/06/2016   Past Medical History:   Diagnosis Date   Anxiety    Asthma    Depression    Herpes    Polycystic ovaries     Family History  Problem Relation Age of Onset   Hypertension Mother    Anxiety disorder Mother    Heart murmur Father    Heart disease Maternal Grandfather    Diabetes Paternal Grandmother    Cancer Paternal Grandmother     Past Surgical History:  Procedure Laterality Date   ANKLE RECONSTRUCTION Right 10/28/2021   Procedure: RIGHT ANTERIOR TALOFIBULAR LIGAMENT REPAIR AND SYNDESMOSIS FIXATION;  Surgeon: Nadara Mustard, MD;  Location: [facility];  Service: Orthopedics;  Laterality: Right;  regional block   CYSTECTOMY     Social History   Occupational History   Not on file  Tobacco Use   Smoking status: Former    Packs/day: 0.50    Types: Cigarettes   Smokeless tobacco: Never   Tobacco comments:    4 cigarettes per day  Vaping Use   Vaping Use: Every day   Substances: Nicotine, Flavoring  Substance and Sexual Activity   Alcohol use: Yes    Alcohol/week: 2.0 standard drinks of alcohol    Types: 2 Cans of beer per week    Comment: occassional   Drug use: Yes    Types: Marijuana    Comment: daily   Sexual activity: Yes    Birth control/protection: None

## 2022-02-04 ENCOUNTER — Telehealth: Payer: Self-pay | Admitting: Orthopedic Surgery

## 2022-02-04 NOTE — Telephone Encounter (Signed)
W/c needs out of work note stating she will be out of work for 4 more weeks from her appt on 01/26/22. Letter has been written and in chart.

## 2022-02-23 ENCOUNTER — Other Ambulatory Visit: Payer: Self-pay | Admitting: Family Medicine

## 2022-02-23 DIAGNOSIS — J4541 Moderate persistent asthma with (acute) exacerbation: Secondary | ICD-10-CM

## 2022-02-24 ENCOUNTER — Other Ambulatory Visit: Payer: Self-pay

## 2022-02-24 MED ORDER — ALBUTEROL SULFATE HFA 108 (90 BASE) MCG/ACT IN AERS
2.0000 | INHALATION_SPRAY | RESPIRATORY_TRACT | 0 refills | Status: DC | PRN
  Filled 2022-02-24: qty 6.7, 17d supply, fill #0
  Filled 2022-02-24: qty 6.7, 25d supply, fill #0

## 2022-02-24 NOTE — Telephone Encounter (Signed)
Requested Prescriptions  Pending Prescriptions Disp Refills  . albuterol (VENTOLIN HFA) 108 (90 Base) MCG/ACT inhaler 18 g 0    Sig: Inhale 2 puffs into the lungs every 4 (four) hours as needed for wheezing or shortness of breath.     Pulmonology:  Beta Agonists 2 Passed - 02/23/2022 12:58 PM      Passed - Last BP in normal range    BP Readings from Last 1 Encounters:  10/28/21 118/81         Passed - Last Heart Rate in normal range    Pulse Readings from Last 1 Encounters:  10/28/21 64         Passed - Valid encounter within last 12 months    Recent Outpatient Visits          9 months ago Encounter for annual physical exam   [facility] [place], Vernia Buff, NP   1 year ago PCOS (polycystic ovarian syndrome)   [facility], Vernia Buff, NP   4 years ago Bradycardia   [place], MD   4 years ago Chronic nonintractable headache, unspecified headache type   [place], [place], FNP   4 years ago Near syncope   [place], Maylon [facility], [place]      Future Appointments            In 2 days Newt Minion, MD [facility]

## 2022-02-26 ENCOUNTER — Ambulatory Visit (INDEPENDENT_AMBULATORY_CARE_PROVIDER_SITE_OTHER): Payer: Worker's Compensation | Admitting: Orthopedic Surgery

## 2022-02-26 DIAGNOSIS — S93491A Sprain of other ligament of right ankle, initial encounter: Secondary | ICD-10-CM

## 2022-03-03 ENCOUNTER — Encounter: Payer: Self-pay | Admitting: Orthopedic Surgery

## 2022-03-03 ENCOUNTER — Emergency Department (HOSPITAL_BASED_OUTPATIENT_CLINIC_OR_DEPARTMENT_OTHER)
Admission: EM | Admit: 2022-03-03 | Discharge: 2022-03-03 | Disposition: A | Discharge status: Home / Self Care | Payer: PRIVATE HEALTH INSURANCE | Attending: Emergency Medicine | Admitting: Emergency Medicine

## 2022-03-03 ENCOUNTER — Other Ambulatory Visit (HOSPITAL_BASED_OUTPATIENT_CLINIC_OR_DEPARTMENT_OTHER): Payer: Self-pay

## 2022-03-03 ENCOUNTER — Encounter (HOSPITAL_BASED_OUTPATIENT_CLINIC_OR_DEPARTMENT_OTHER): Payer: Self-pay

## 2022-03-03 ENCOUNTER — Emergency Department (HOSPITAL_BASED_OUTPATIENT_CLINIC_OR_DEPARTMENT_OTHER): Payer: PRIVATE HEALTH INSURANCE | Admitting: Radiology

## 2022-03-03 ENCOUNTER — Other Ambulatory Visit: Payer: Self-pay

## 2022-03-03 DIAGNOSIS — Z23 Encounter for immunization: Secondary | ICD-10-CM | POA: Insufficient documentation

## 2022-03-03 DIAGNOSIS — M79645 Pain in left finger(s): Secondary | ICD-10-CM | POA: Insufficient documentation

## 2022-03-03 DIAGNOSIS — W260XXA Contact with knife, initial encounter: Secondary | ICD-10-CM | POA: Insufficient documentation

## 2022-03-03 DIAGNOSIS — S61122A Laceration with foreign body of left thumb with damage to nail, initial encounter: Secondary | ICD-10-CM | POA: Insufficient documentation

## 2022-03-03 DIAGNOSIS — S61012A Laceration without foreign body of left thumb without damage to nail, initial encounter: Secondary | ICD-10-CM

## 2022-03-03 MED ORDER — IBUPROFEN 800 MG PO TABS
800.0000 mg | ORAL_TABLET | Freq: Once | ORAL | Status: AC
  Administered 2022-03-03: 800 mg via ORAL
  Filled 2022-03-03: qty 1

## 2022-03-03 MED ORDER — TETANUS-DIPHTH-ACELL PERTUSSIS 5-2.5-18.5 LF-MCG/0.5 IM SUSY
0.5000 mL | PREFILLED_SYRINGE | Freq: Once | INTRAMUSCULAR | Status: AC
Start: 2022-03-03 — End: 2022-03-03
  Administered 2022-03-03: 0.5 mL via INTRAMUSCULAR
  Filled 2022-03-03: qty 0.5

## 2022-03-03 MED ORDER — SULFAMETHOXAZOLE-TRIMETHOPRIM 800-160 MG PO TABS
1.0000 | ORAL_TABLET | Freq: Two times a day (BID) | ORAL | 0 refills | Status: DC
  Filled 2022-03-03: qty 14, 7d supply, fill #0

## 2022-03-03 MED ORDER — IBUPROFEN 800 MG PO TABS
800.0000 mg | ORAL_TABLET | Freq: Three times a day (TID) | ORAL | 0 refills | Status: DC | PRN
  Filled 2022-03-03: qty 30, 10d supply, fill #0

## 2022-03-03 NOTE — Progress Notes (Signed)
Office Visit Note   Patient: Jenna Henry           Date of Birth: 22-Sep-1995           MRN: [id number] Visit Date: 02/26/2022              Requested by: Gildardo Pounds, NP [place],  [place] [address] PCP: Gildardo Pounds, NP  Chief Complaint  Patient presents with   Right Ankle - Follow-up    10/28/2021 right ankle ATFL repair and syndesmosis fixation      HPI: Patient is a 26 year old woman who is 4 months status post anterior talofibular reconstruction and syndesmosis repair she has been in an ankle stabilizing brace.  She only wears this when she is on long walks.  Assessment & Plan: Visit Diagnoses:  1. Sprain of anterior talofibular ligament of right ankle, initial encounter   2. High ankle sprain of right lower extremity, initial encounter     Plan: Patient is provided a note to return to work without restrictions.  Continue with strengthening and exercise.  Follow-Up Instructions: Return if symptoms worsen or fail to improve.   Ortho Exam  Patient is alert, oriented, no adenopathy, well-dressed, normal affect, normal respiratory effort. Examination anterior drawer is stable the syndesmosis is not tender.  She has good range of motion of her ankle the incisions are well-healed.  Imaging: DG Hand Complete Left  Result Date: 03/03/2022 CLINICAL DATA:  Skin laceration in left thumb EXAM: LEFT HAND - COMPLETE 3+ VIEW COMPARISON:  None Available. FINDINGS: No displaced fracture or dislocation is seen. There is a small lucency in the palmar aspect of proximal shaft of distal phalanx of left thumb. There is irregularity in the soft tissues in the palmar aspect of left thumb. No opaque foreign bodies are seen. IMPRESSION: There is small lucency in the cortical margin of palmar aspect of proximal shaft of distal phalanx of thumb suggesting possible cut injury to the cortex. No displaced fracture or dislocation is seen. There are no opaque foreign  bodies. Electronically Signed   By: Elmer Picker M.D.   On: 03/03/2022 11:56   No images are attached to the encounter.  Labs: Lab Results  Component Value Date   HGBA1C 5.0 10/11/2018   HGBA1C 5.2 09/24/2017   HGBA1C 5.1 02/28/2017   REPTSTATUS 12/12/2019 FINAL 12/11/2019   CULT (A) 12/11/2019    <10,000 COLONIES/mL INSIGNIFICANT GROWTH Performed at [facility], [place] [address], [place], [place] [address]      Lab Results  Component Value Date   ALBUMIN 4.0 11/08/2020   ALBUMIN 3.9 12/14/2019   ALBUMIN 4.2 12/03/2019    Lab Results  Component Value Date   MG 2.1 10/09/2018   MG 2.2 09/24/2017   Lab Results  Component Value Date   VD25OH 22 (L) 06/05/2016    No results found for: "PREALBUMIN"    Latest Ref Rng & Units 05/16/2021   11:59 AM 11/19/2020    9:33 AM 11/08/2020   11:33 AM  CBC EXTENDED  WBC 4.0 - 10.5 K/uL 5.8  8.0  6.7   RBC 3.87 - 5.11 MIL/uL 4.23  4.37  4.59   Hemoglobin 12.0 - 15.0 g/dL 14.1  14.0  14.7   HCT 36.0 - 46.0 % 42.4  42.2  44.7   Platelets 150 - 400 K/uL 258  229  244      There is no height or weight on  file to calculate BMI.  Orders:  No orders of the defined types were placed in this encounter.  No orders of the defined types were placed in this encounter.    Procedures: No procedures performed  Clinical Data: No additional findings.  ROS:  All other systems negative, except as noted in the HPI. Review of Systems  Objective: Vital Signs: There were no vitals taken for this visit.  Specialty Comments:  No specialty comments available.  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   Palpitations 11/09/2017   Chest pain 11/09/2017   Cigarette smoker 11/09/2017   Severe major depression without psychotic features ([facility]) 02/28/2017   MDD (major depressive disorder), recurrent, severe, 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   Suicide attempt ([facility]) 02/28/2017   Anxiety state 02/28/2017   Insomnia 02/28/2017   Herpes 11/12/2016   Irregular menstruation 07/06/2016   Past Medical History:  Diagnosis Date   Anxiety    Asthma    Depression    Herpes    Polycystic ovaries     Family History  Problem Relation Age of Onset   Hypertension Mother    Anxiety disorder Mother    Heart murmur Father    Heart disease Maternal Grandfather    Diabetes Paternal Grandmother    Cancer Paternal Grandmother     Past Surgical History:  Procedure Laterality Date   ANKLE RECONSTRUCTION Right 10/28/2021   Procedure: RIGHT ANTERIOR TALOFIBULAR LIGAMENT REPAIR AND SYNDESMOSIS FIXATION;  Surgeon: Newt Minion, MD;  Location: [facility];  Service: Orthopedics;  Laterality: Right;  regional block   CYSTECTOMY     Social History   Occupational History   Not on file  Tobacco Use   Smoking status: Former    Packs/day: 0.50    Types: Cigarettes   Smokeless tobacco: Never   Tobacco comments:    4 cigarettes per day  Vaping Use   Vaping Use: Every day   Substances: Nicotine, Flavoring  Substance and Sexual Activity   Alcohol use: Yes    Alcohol/week: 2.0 standard drinks of alcohol    Types: 2 Cans of beer per week    Comment: occassional   Drug use: Yes    Types: Marijuana    Comment: daily   Sexual activity: Yes    Birth control/protection: None

## 2022-03-03 NOTE — ED Provider Notes (Signed)
[facility] EMERGENCY DEPT Provider Note   CSN: [id number] Arrival date & time: 03/03/22  1035     History  Chief Complaint  Patient presents with   Laceration    Jenna Henry is a 26 y.o. female.  The history is provided by the patient. No language interpreter was used.  Laceration Location:  Finger Finger laceration location:  L thumb Length:  1 Depth:  Through dermis Quality: straight   Time since incident:  1 day Laceration mechanism:  Knife Pain details:    Quality:  Aching   Severity:  Moderate   Timing:  Constant   Progression:  Worsening Foreign body present:  No foreign bodies Relieved by:  Nothing Worsened by:  Nothing Tetanus status:  Unknown      Home Medications Prior to Admission medications   Medication Sig Start Date End Date Taking? Authorizing Provider  albuterol (VENTOLIN HFA) 108 (90 Base) MCG/ACT inhaler Inhale 2 puffs into the lungs every 4 (four) hours as needed for wheezing or shortness of breath. (Must have office visit for refills) 02/24/22   Hoy Register, MD  chlorhexidine (HIBICLENS) 4 % external liquid Apply topically daily as needed. Patient taking differently: Apply 1 application. topically See admin instructions. Cleanse affected sites daily as directed 02/24/21   Claiborne Rigg, NP  clindamycin (CLINDAGEL) 1 % gel Apply topically 2 (two) times daily as needed. Use only as needed. Patient taking differently: Apply 1 application. topically See admin instructions. Apply daily to affected sites as directed 02/24/21   Claiborne Rigg, NP  HYDROcodone-acetaminophen (NORCO/VICODIN) 5-325 MG tablet Take 1 tablet by mouth every 6 (six) hours as needed for moderate pain. Patient not taking: Reported on 12/08/2021 11/19/21   Adonis Huguenin, NP  valACYclovir (VALTREX) 1000 MG tablet Take 1,000 mg by mouth 2 (two) times daily.    [provider]      Allergies    Ortho tri-cyclen [norgestimate-eth estradiol]    Review  of Systems   Review of Systems  Musculoskeletal:  Positive for myalgias.  Skin:  Positive for wound.  All other systems reviewed and are negative.   Physical Exam Updated Vital Signs BP 105/72   Pulse 76   Temp 97.9 F (36.6 C)   Resp 16   SpO2 98%  Physical Exam Vitals and nursing note reviewed.  Constitutional:      Appearance: She is well-developed.  HENT:     Head: Normocephalic.  Cardiovascular:     Rate and Rhythm: Normal rate.  Pulmonary:     Effort: Pulmonary effort is normal.  Abdominal:     General: There is no distension.  Musculoskeletal:        General: Normal range of motion.     Cervical back: Normal range of motion.  Skin:    General: Skin is warm.     Comments: 1cm laceration distal left thumb.  From  nv and ns intact  Neurological:     General: No focal deficit present.     Mental Status: She is alert and oriented to person, place, and time.     ED Results / Procedures / Treatments   Labs (all labs ordered are listed, but only abnormal results are displayed) Labs Reviewed - No data to display  EKG None  Radiology DG Hand Complete Left  Result Date: 03/03/2022 CLINICAL DATA:  Skin laceration in left thumb EXAM: LEFT HAND - COMPLETE 3+ VIEW COMPARISON:  None Available. FINDINGS: No displaced fracture or dislocation  is seen. There is a small lucency in the palmar aspect of proximal shaft of distal phalanx of left thumb. There is irregularity in the soft tissues in the palmar aspect of left thumb. No opaque foreign bodies are seen. IMPRESSION: There is small lucency in the cortical margin of palmar aspect of proximal shaft of distal phalanx of thumb suggesting possible cut injury to the cortex. No displaced fracture or dislocation is seen. There are no opaque foreign bodies. Electronically Signed   By: Elmer Picker M.D.   On: 03/03/2022 11:56    Procedures Procedures    Medications Ordered in ED Medications - No data to display  ED  Course/ Medical Decision Making/ A&P                           Medical Decision Making Pt cut her finger with a knife while cutting lettuce  Amount and/or Complexity of Data Reviewed Radiology: ordered and independent interpretation performed. Decision-making details documented in ED Course.    Details: Xray shows a small lucency in bone that could be from knife cut   Risk Prescription drug management. Risk Details: Pt placed in a splint.  Rx for bactrim and ibuprofen.  Pt advised to follow up with Ortho Hand for recheck in 1 week.  No sutures due to age of wound            Final Clinical Impression(s) / ED Diagnoses Final diagnoses:  Laceration of left thumb, foreign body presence unspecified, nail damage status unspecified, initial encounter    Rx / DC Orders ED Discharge Orders          Ordered    sulfamethoxazole-trimethoprim (BACTRIM DS) 800-160 MG tablet  2 times daily        03/03/22 1227    ibuprofen (ADVIL) 800 MG tablet  Every 8 hours PRN        03/03/22 1227          An After Visit Summary was printed and given to the patient.     Sidney Ace 03/03/22 1228    Hayden Rasmussen, MD 03/03/22 [phone]

## 2022-03-03 NOTE — ED Triage Notes (Signed)
Pt presents POV from home, ambulatory  Pt presents with lac to Left thumb. Pt reports cutting her thumb yesterday morning while cutting lettuce. Bandage in place, bleeding controlled, pt here d/t increase pain. Unsure of last tetanus shot

## 2022-03-10 DIAGNOSIS — S61012A Laceration without foreign body of left thumb without damage to nail, initial encounter: Secondary | ICD-10-CM | POA: Insufficient documentation

## 2022-03-10 HISTORY — DX: Laceration without foreign body of left thumb without damage to nail, initial encounter: S61.012A

## 2022-03-22 ENCOUNTER — Encounter (HOSPITAL_COMMUNITY): Payer: Self-pay | Admitting: Emergency Medicine

## 2022-03-22 ENCOUNTER — Ambulatory Visit (HOSPITAL_COMMUNITY)
Admission: EM | Admit: 2022-03-22 | Discharge: 2022-03-22 | Disposition: A | Discharge status: Home / Self Care | Payer: Medicaid Other | Attending: Emergency Medicine | Admitting: Emergency Medicine

## 2022-03-22 ENCOUNTER — Other Ambulatory Visit: Payer: Self-pay

## 2022-03-22 DIAGNOSIS — N898 Other specified noninflammatory disorders of vagina: Secondary | ICD-10-CM | POA: Insufficient documentation

## 2022-03-22 NOTE — Discharge Instructions (Addendum)
We will call you if anything on your swab returns positive and requires treatment.  I recommend warm compress, zyrtec and/or benadryl to help with itching.

## 2022-03-22 NOTE — ED Triage Notes (Signed)
Symptoms started 3 days.  Patient thinks symptoms related to a new soap.  Reports skin is irritated and itches.  Denies discharge or a rash.

## 2022-03-22 NOTE — ED Provider Notes (Signed)
[facility]    CSN: [id number] Arrival date & time: 03/22/22  1005      History   Chief Complaint Chief Complaint  Patient presents with   Vaginal Itching    HPI Malonie Tatum is a 26 y.o. female.  Presents with 3 day history of vaginal itching Used a new soap and feels this is the cause External itching, skin irritation, no rash noted Denies vaginal discharge or urinary symptoms   Past Medical History:  Diagnosis Date   Anxiety    Asthma    Depression    Herpes    Polycystic ovaries     Patient Active Problem List   Diagnosis Date Noted   Sprain of anterior talofibular ligament of right ankle    Syndesmotic disruption of right ankle    Head trauma, sequela 06/03/2021   New daily persistent headache 06/03/2021   Dizziness and giddiness 06/03/2021   Bipolar disorder ([facility]) 10/11/2018   MDD (major depressive disorder) 10/10/2018   Intentional drug overdose ([facility]) 10/09/2018   Hypokalemia 10/09/2018   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   Palpitations 11/09/2017   Chest pain 11/09/2017   Cigarette smoker 11/09/2017   Severe major depression without psychotic features ([facility]) 02/28/2017   MDD (major depressive disorder), recurrent, severe, 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   Suicide attempt ([facility]) 02/28/2017   Anxiety state 02/28/2017   Insomnia 02/28/2017   Herpes 11/12/2016   Irregular menstruation 07/06/2016    Past Surgical History:  Procedure Laterality Date   ANKLE RECONSTRUCTION Right 10/28/2021   Procedure: RIGHT ANTERIOR TALOFIBULAR LIGAMENT REPAIR AND SYNDESMOSIS FIXATION;  Surgeon: Nadara Mustard, MD;  Location: [facility];  Service: Orthopedics;  Laterality: Right;  regional block   CYSTECTOMY      OB History     Gravida  0   Para  0   Term  0   Preterm  0   AB  0   Living  0      SAB  0   IAB  0   Ectopic  0   Multiple  0   Live Births  0            Home Medications    Prior to Admission  medications   Medication Sig Start Date End Date Taking? Authorizing Provider  albuterol (VENTOLIN HFA) 108 (90 Base) MCG/ACT inhaler Inhale 2 puffs into the lungs every 4 (four) hours as needed for wheezing or shortness of breath. (Must have office visit for refills) 02/24/22   Hoy Register, MD  chlorhexidine (HIBICLENS) 4 % external liquid Apply topically daily as needed. Patient taking differently: Apply 1 application. topically See admin instructions. Cleanse affected sites daily as directed 02/24/21   Claiborne Rigg, NP  clindamycin (CLINDAGEL) 1 % gel Apply topically 2 (two) times daily as needed. Use only as needed. Patient taking differently: Apply 1 application. topically See admin instructions. Apply daily to affected sites as directed 02/24/21   Claiborne Rigg, NP  ibuprofen (ADVIL) 800 MG tablet Take 1 tablet (800 mg total) by mouth every 8 (eight) hours as needed. 03/03/22   Elson Areas, PA-C  valACYclovir (VALTREX) 1000 MG tablet Take 1,000 mg by mouth 2 (two) times daily.    [provider]    Family History Family History  Problem Relation Age of Onset   Hypertension Mother    Anxiety disorder Mother    Heart murmur Father    Heart disease Maternal Grandfather  Diabetes Paternal Grandmother    Cancer Paternal Grandmother     Social History Social History   Tobacco Use   Smoking status: Former    Packs/day: 0.50    Types: Cigarettes   Smokeless tobacco: Never   Tobacco comments:    4 cigarettes per day  Vaping Use   Vaping Use: Every day   Substances: Nicotine, Flavoring  Substance Use Topics   Alcohol use: Yes    Alcohol/week: 2.0 standard drinks of alcohol    Types: 2 Cans of beer per week    Comment: occassional   Drug use: Yes    Types: Marijuana    Comment: daily     Allergies   Ortho tri-cyclen [norgestimate-eth estradiol]   Review of Systems Review of Systems Per HPI  Physical Exam Triage Vital Signs ED Triage Vitals  [03/22/22 1038]  Enc Vitals Group     BP      Pulse      Resp      Temp      Temp src      SpO2      Weight      Height      Head Circumference      Peak Flow      Pain Score 9     Pain Loc      Pain Edu?      Excl. in GC?    No data found.  Updated Vital Signs BP 108/81 (BP Location: Left Arm)   Pulse 82   Temp 98.3 F (36.8 C) (Oral)   Resp 18   LMP 02/19/2022   SpO2 100%    Physical Exam Vitals and nursing note reviewed. Exam conducted with a chaperone present [facility] CMA).  Constitutional:      General: She is not in acute distress.    Appearance: Normal appearance.  HENT:     Mouth/Throat:     Pharynx: Oropharynx is clear.  Cardiovascular:     Rate and Rhythm: Normal rate and regular rhythm.     Pulses: Normal pulses.  Pulmonary:     Effort: Pulmonary effort is normal.  Abdominal:     Tenderness: There is no abdominal tenderness.  Genitourinary:    General: Normal vulva.     Pubic Area: No rash.      Labia:        Right: No rash.        Left: No rash.      Vagina: Vaginal discharge present.     Comments: Discharge at the introitus. Swab collected Neurological:     Mental Status: She is alert and oriented to person, place, and time.      UC Treatments / Results  Labs (all labs ordered are listed, but only abnormal results are displayed) Labs Reviewed  CERVICOVAGINAL ANCILLARY ONLY    EKG  Radiology No results found.  Procedures Procedures (including critical care time)  Medications Ordered in UC Medications - No data to display  Initial Impression / Assessment and Plan / UC Course  I have reviewed the triage vital signs and the nursing notes.  Pertinent labs & imaging results that were available during my care of the patient were reviewed by me and considered in my medical decision making (see chart for details).  Cyto swab pending Discussed use of fragrance free soaps/body wash, not using any products internally  For itching I  recommend warm compress, try zyrtec 10 mg daily, can use benadryl 25 mg at  night as needed Discussed scratching can worsen symptoms Return precautions discussed. Patient agrees to plan  Final Clinical Impressions(s) / UC Diagnoses   Final diagnoses:  Vaginal itching  Vaginal discharge     Discharge Instructions      We will call you if anything on your swab returns positive and requires treatment.  I recommend warm compress, zyrtec and/or benadryl to help with itching.      ED Prescriptions   None    PDMP not reviewed this encounter.   Auguste Tebbetts, Wells Guiles, PA-C 03/22/22 1101

## 2022-03-23 LAB — CERVICOVAGINAL ANCILLARY ONLY
Bacterial Vaginitis (gardnerella): NEGATIVE
Candida Glabrata: NEGATIVE
Candida Vaginitis: POSITIVE — AB
Chlamydia: NEGATIVE
Comment: NEGATIVE
Comment: NEGATIVE
Comment: NEGATIVE
Comment: NEGATIVE
Comment: NEGATIVE
Comment: NORMAL
Neisseria Gonorrhea: NEGATIVE
Trichomonas: NEGATIVE

## 2022-03-24 ENCOUNTER — Telehealth (HOSPITAL_COMMUNITY): Payer: Self-pay | Admitting: Emergency Medicine

## 2022-03-24 ENCOUNTER — Other Ambulatory Visit: Payer: Self-pay

## 2022-03-24 MED ORDER — FLUCONAZOLE 150 MG PO TABS
150.0000 mg | ORAL_TABLET | Freq: Once | ORAL | 0 refills | Status: AC
  Filled 2022-03-24: qty 2, 2d supply, fill #0

## 2022-06-23 ENCOUNTER — Ambulatory Visit (INDEPENDENT_AMBULATORY_CARE_PROVIDER_SITE_OTHER): Payer: Medicaid Other | Admitting: Family Medicine

## 2022-06-23 ENCOUNTER — Encounter: Payer: Self-pay | Admitting: Family Medicine

## 2022-06-23 VITALS — BP 94/62 | HR 60 | Temp 98.9°F | Ht 65.0 in | Wt 180.2 lb

## 2022-06-23 DIAGNOSIS — J4520 Mild intermittent asthma, uncomplicated: Secondary | ICD-10-CM | POA: Diagnosis not present

## 2022-06-23 DIAGNOSIS — E282 Polycystic ovarian syndrome: Secondary | ICD-10-CM | POA: Diagnosis not present

## 2022-06-23 DIAGNOSIS — L732 Hidradenitis suppurativa: Secondary | ICD-10-CM | POA: Insufficient documentation

## 2022-06-23 DIAGNOSIS — J4541 Moderate persistent asthma with (acute) exacerbation: Secondary | ICD-10-CM

## 2022-06-23 MED ORDER — DOXYCYCLINE HYCLATE 100 MG PO TABS
100.0000 mg | ORAL_TABLET | Freq: Every day | ORAL | 0 refills | Status: DC
Start: 2022-06-23 — End: 2022-08-29

## 2022-06-23 MED ORDER — METFORMIN HCL 500 MG PO TABS: 500.0000 mg | ORAL_TABLET | Freq: Two times a day (BID) | ORAL | 3 refills | Status: DC

## 2022-06-23 MED ORDER — ALBUTEROL SULFATE HFA 108 (90 BASE) MCG/ACT IN AERS: 2.0000 | INHALATION_SPRAY | RESPIRATORY_TRACT | 2 refills | Status: DC | PRN

## 2022-06-23 NOTE — Progress Notes (Signed)
New Patient Office Visit  Subjective    Patient ID: Jenna Henry, female    DOB: [birth year]  Age: 27 y.o. MRN: [id number]  CC:  Chief Complaint  Patient presents with  . Establish Care    HPI Jenna Henry presents to establish care Patient is here to discuss the cysts in her armpits on both sides, has had them since high school. She reports that she has had surgeries on them, states she has been using topical gels and creams without any improvement.   Outpatient Encounter Medications as of 06/23/2022  Medication Sig  . doxycycline (VIBRA-TABS) 100 MG tablet Take 1 tablet (100 mg total) by mouth daily.  . metFORMIN (GLUCOPHAGE) 500 MG tablet Take 1 tablet (500 mg total) by mouth 2 (two) times daily with a meal.  . valACYclovir (VALTREX) 1000 MG tablet Take 1,000 mg by mouth 2 (two) times daily.  . [DISCONTINUED] albuterol (VENTOLIN HFA) 108 (90 Base) MCG/ACT inhaler Inhale 2 puffs into the lungs every 4 (four) hours as needed for wheezing or shortness of breath. (Must have office visit for refills)  . albuterol (VENTOLIN HFA) 108 (90 Base) MCG/ACT inhaler Inhale 2 puffs into the lungs every 4 (four) hours as needed for wheezing or shortness of breath. (Must have office visit for refills)  . [DISCONTINUED] chlorhexidine (HIBICLENS) 4 % external liquid Apply topically daily as needed. (Patient taking differently: Apply 1 application. topically See admin instructions. Cleanse affected sites daily as directed)  . [DISCONTINUED] clindamycin (CLINDAGEL) 1 % gel Apply topically 2 (two) times daily as needed. Use only as needed. (Patient taking differently: Apply 1 application. topically See admin instructions. Apply daily to affected sites as directed)  . [DISCONTINUED] ibuprofen (ADVIL) 800 MG tablet Take 1 tablet (800 mg total) by mouth every 8 (eight) hours as needed.   No facility-administered encounter medications on file as of 06/23/2022.    Past Medical History:  Diagnosis Date  . Anxiety    . Asthma   . Depression   . Herpes   . PCOS (polycystic ovarian syndrome)   . Polycystic ovaries     Past Surgical History:  Procedure Laterality Date  . ANKLE RECONSTRUCTION Right 10/28/2021   Procedure: RIGHT ANTERIOR TALOFIBULAR LIGAMENT REPAIR AND SYNDESMOSIS FIXATION;  Surgeon: Newt Minion, MD;  Location: [facility];  Service: Orthopedics;  Laterality: Right;  regional block  . CYSTECTOMY     axillary area per patient    Family History  Problem Relation Age of Onset  . Depression Mother   . Arthritis Mother   . Hypertension Mother   . Anxiety disorder Mother   . Heart murmur Father   . Hyperlipidemia Maternal Grandmother   . Hypertension Maternal Grandfather   . Heart disease Maternal Grandfather   . Diabetes Maternal Grandfather   . COPD Maternal Grandfather   . Heart attack Maternal Grandfather     Social History   Socioeconomic History  . Marital status: Single    Spouse name: Not on file  . Number of children: Not on file  . Years of education: Not on file  . Highest education level: Not on file  Occupational History  . Not on file  Tobacco Use  . Smoking status: Former    Packs/day: 0.50    Types: Cigarettes  . Smokeless tobacco: Never  . Tobacco comments:    4 cigarettes per day  Vaping Use  . Vaping Use: Every day  . Substances: Nicotine, Flavoring  Substance  and Sexual Activity  . Alcohol use: Yes    Alcohol/week: 2.0 standard drinks of alcohol    Types: 2 Cans of beer per week    Comment: occassional  . Drug use: Yes    Types: Marijuana    Comment: daily  . Sexual activity: Yes    Birth control/protection: None  Other Topics Concern  . Not on file  Social History Narrative  . Not on file   Social Determinants of Health   Financial Resource Strain: Not on file  Food Insecurity: Food Insecurity Present (02/22/2019)   Hunger Vital Sign   . Worried About Charity fundraiser in the Last Year: Often true   . Ran Out of  Food in the Last Year: Often true  Transportation Needs: Unmet Transportation Needs (02/22/2019)   PRAPARE - Transportation   . Lack of Transportation (Medical): Yes   . Lack of Transportation (Non-Medical): Yes  Physical Activity: Not on file  Stress: Not on file  Social Connections: Not on file  Intimate Partner Violence: Not on file    Review of Systems  All other systems reviewed and are negative.       Objective    BP 94/62 (BP Location: Left Arm, Patient Position: Sitting, Cuff Size: Large)   Pulse 60   Temp 98.9 F (37.2 C) (Oral)   Ht 5\' 5"  (1.651 m)   Wt 180 lb 3.2 oz (81.7 kg)   LMP 05/27/2022   SpO2 97%   BMI 29.99 kg/m   Physical Exam Vitals reviewed.  Constitutional:      Appearance: Normal appearance. She is well-groomed and normal weight.  Eyes:     Conjunctiva/sclera: Conjunctivae normal.  Neck:     Thyroid: No thyromegaly.  Cardiovascular:     Rate and Rhythm: Normal rate and regular rhythm.     Pulses: Normal pulses.     Heart sounds: S1 normal and S2 normal.  Pulmonary:     Effort: Pulmonary effort is normal.     Breath sounds: Normal breath sounds and air entry.  Abdominal:     General: Bowel sounds are normal.  Musculoskeletal:     Right lower leg: No edema.     Left lower leg: No edema.  Neurological:     Mental Status: She is alert and oriented to person, place, and time. Mental status is at baseline.     Gait: Gait is intact.  Psychiatric:        Mood and Affect: Mood and affect normal.        Speech: Speech normal.        Behavior: Behavior normal.        Judgment: Judgment normal.    {Labs (Optional):23779}    Assessment & Plan:   Problem List Items Addressed This Visit       Unprioritized   Hydradenitis - Primary (Chronic)   Relevant Medications   doxycycline (VIBRA-TABS) 100 MG tablet   metFORMIN (GLUCOPHAGE) 500 MG tablet   PCOS (polycystic ovarian syndrome)   Relevant Orders   CMP   CBC (no diff)   Hemoglobin  A1c   Mild intermittent asthma   Relevant Medications   albuterol (VENTOLIN HFA) 108 (90 Base) MCG/ACT inhaler   Other Visit Diagnoses     Moderate persistent asthma with acute exacerbation       Relevant Medications   albuterol (VENTOLIN HFA) 108 (90 Base) MCG/ACT inhaler       Return in about 3 months (around  09/22/2022) for follow up and pap smear visit.   [name], MD

## 2022-06-23 NOTE — Patient Instructions (Signed)
Doxycycline 1 tablet twice a day for 7 days, then go down to 1 tablet daily  Metformin-- start with 1 tablet daily then work your way up to 1 tablet twice a day.

## 2022-06-24 NOTE — Assessment & Plan Note (Signed)
We discussed adding metormin 500 mg BID to help with her PCOS and she is agreeable. I ordered a new set of bloodwork including CMP, CBC, and A1C.

## 2022-06-24 NOTE — Assessment & Plan Note (Signed)
BP axilla, not improved with topical ointments. She continues to have recurrent infections, I recommend a 3 month course of oral doxycycline 100 mg daily and the addition of metformin 500 mg BID. Pt is agreeable to this. I will see her back in 3 months to re-evaluate her skin.

## 2022-06-24 NOTE — Assessment & Plan Note (Signed)
Pt requesting refill of her albuterol inhaler, lungs are clear on exam today, will continue albuterol 90 mcg inhaler, 2 puffs every 4-6 hours as needed for bronchospasm.

## 2022-06-25 ENCOUNTER — Other Ambulatory Visit (INDEPENDENT_AMBULATORY_CARE_PROVIDER_SITE_OTHER): Payer: Medicaid Other

## 2022-06-25 DIAGNOSIS — E282 Polycystic ovarian syndrome: Secondary | ICD-10-CM

## 2022-06-26 ENCOUNTER — Other Ambulatory Visit: Payer: Medicaid Other

## 2022-06-26 LAB — CBC
HCT: 41.6 % (ref 36.0–46.0)
Hemoglobin: 13.9 g/dL (ref 12.0–15.0)
MCHC: 33.3 g/dL (ref 30.0–36.0)
MCV: 96.1 fl (ref 78.0–100.0)
Platelets: 239 10*3/uL (ref 150.0–400.0)
RBC: 4.34 Mil/uL (ref 3.87–5.11)
RDW: 14.9 % (ref 11.5–15.5)
WBC: 4.8 10*3/uL (ref 4.0–10.5)

## 2022-06-26 LAB — HEMOGLOBIN A1C: Hgb A1c MFr Bld: 5.4 % (ref 4.6–6.5)

## 2022-06-26 LAB — COMPREHENSIVE METABOLIC PANEL
ALT: 20 U/L (ref 0–35)
AST: 19 U/L (ref 0–37)
Albumin: 4.3 g/dL (ref 3.5–5.2)
Alkaline Phosphatase: 56 U/L (ref 39–117)
BUN: 6 mg/dL (ref 6–23)
CO2: 22 mEq/L (ref 19–32)
Calcium: 10.4 mg/dL (ref 8.4–10.5)
Chloride: 105 mEq/L (ref 96–112)
Creatinine, Ser: 0.59 mg/dL (ref 0.40–1.20)
GFR: 126.1 mL/min (ref >60.00)
Glucose, Bld: 80 mg/dL (ref 70–99)
Potassium: 3.8 mEq/L (ref 3.5–5.1)
Sodium: 136 mEq/L (ref 135–145)
Total Bilirubin: 0.6 mg/dL (ref 0.2–1.2)
Total Protein: 7.6 g/dL (ref 6.0–8.3)

## 2022-06-30 ENCOUNTER — Encounter (HOSPITAL_COMMUNITY): Payer: Self-pay

## 2022-06-30 ENCOUNTER — Ambulatory Visit (HOSPITAL_COMMUNITY)
Admission: EM | Admit: 2022-06-30 | Discharge: 2022-06-30 | Disposition: A | Discharge status: Home / Self Care | Payer: Medicaid Other | Attending: Internal Medicine | Admitting: Internal Medicine

## 2022-06-30 DIAGNOSIS — R0981 Nasal congestion: Secondary | ICD-10-CM

## 2022-06-30 DIAGNOSIS — B349 Viral infection, unspecified: Secondary | ICD-10-CM | POA: Insufficient documentation

## 2022-06-30 DIAGNOSIS — Z1152 Encounter for screening for COVID-19: Secondary | ICD-10-CM | POA: Diagnosis present

## 2022-06-30 NOTE — Discharge Instructions (Signed)
You have a viral upper respiratory infection.  COVID-19 testing is pending. We will call you with results if positive. If your COVID test is positive, you must stay at home until day 6 of symptoms. On day 6, you may go out into public and go back to work, but you must wear a mask until day 11 of symptoms to prevent spread to others.  Use the following medicines to help with symptoms: -Cetirizine 10 mg once daily to help dry up nasal drainage. -Purchase Flonase over-the-counter and use 2 puffs into each nostril once daily to help with nasal congestion as well. - Tylenol 1,000mg  and/or ibuprofen 600mg  every 6 hours with food as needed for aches/pains or fever/chills.   1 tablespoon of honey in warm water and/or salt water gargles may also help with symptoms. Humidifier to your room will help add water to the air and reduce coughing.  If you develop any new or worsening symptoms, please return.  If your symptoms are severe, please go to the emergency room.  Follow-up with your primary care provider for further evaluation and management of your symptoms as well as ongoing wellness visits.  I hope you feel better!

## 2022-06-30 NOTE — ED Triage Notes (Signed)
Patient c/o nasal congestion and a non productive cough x 3 days.  Patient states she has been taking Dayquil/Nyquil and the last time she took was last night.

## 2022-06-30 NOTE — ED Provider Notes (Signed)
[facility]    CSN: [id number] Arrival date & time: 06/30/22  5809      History   Chief Complaint Chief Complaint  Patient presents with   Cough   Nasal Congestion    HPI Jenna Henry is a 27 y.o. female.   Patient presents urgent care for evaluation of nasal congestion, sneezing, and generalized fatigue for the last 3 days.  She denies cough, sore throat, chest pain, shortness of breath, headache, ear pain, decreased appetite, loss of taste and smell, nausea, vomiting, abdominal pain, and dizziness.  [place] states [place] is exposed [place] multiple travelers daily.  She was also recently exposed to children with the flu.  She has not had a fever or chills.  She is not vaccinated against COVID-19 and is a current everyday marijuana smoker.  Denies other drug use.  She has a history of asthma that is well-controlled with as needed use of albuterol inhaler.  No recent asthma exacerbations reported.  Denies recent antibiotic or steroid use.  She has been using DayQuil and NyQuil to help with symptoms without much relief.   Cough   Past Medical History:  Diagnosis Date   Anxiety    Asthma    Depression    Herpes    PCOS (polycystic ovarian syndrome)    Polycystic ovaries     Patient Active Problem List   Diagnosis Date Noted   Hydradenitis 06/23/2022   PCOS (polycystic ovarian syndrome) 06/23/2022   Mild intermittent asthma 06/23/2022   Sprain of anterior talofibular ligament of right ankle    Syndesmotic disruption of right ankle    Head trauma, sequela 06/03/2021   New daily persistent headache 06/03/2021   Dizziness and giddiness 06/03/2021   MDD (major depressive disorder) 10/10/2018   Hypokalemia 10/09/2018   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   Palpitations 11/09/2017   Chest pain 11/09/2017   Cigarette smoker 11/09/2017   Anxiety state 02/28/2017   Insomnia 02/28/2017   Herpes 11/12/2016   Irregular menstruation 07/06/2016     Past Surgical History:  Procedure Laterality Date   ANKLE RECONSTRUCTION Right 10/28/2021   Procedure: RIGHT ANTERIOR TALOFIBULAR LIGAMENT REPAIR AND SYNDESMOSIS FIXATION;  Surgeon: Newt Minion, MD;  Location: [place];  Service: Orthopedics;  Laterality: Right;  regional block   CYSTECTOMY     axillary area per patient    OB History     Gravida  0   Para  0   Term  0   Preterm  0   AB  0   Living  0      SAB  0   IAB  0   Ectopic  0   Multiple  0   Live Births  0            Home Medications    Prior to Admission medications   Medication Sig Start Date End Date Taking? Authorizing Provider  albuterol (VENTOLIN HFA) 108 (90 Base) MCG/ACT inhaler Inhale 2 puffs into the lungs every 4 (four) hours as needed for wheezing or shortness of breath. (Must have office visit for refills) 06/23/22   Farrel Conners, MD  doxycycline (VIBRA-TABS) 100 MG tablet Take 1 tablet (100 mg total) by mouth daily. 06/23/22   Farrel Conners, MD  metFORMIN (GLUCOPHAGE) 500 MG tablet Take 1 tablet (500 mg total) by mouth 2 (two) times daily with a meal. 06/23/22   Farrel Conners, MD  valACYclovir (VALTREX) 1000  MG tablet Take 1,000 mg by mouth 2 (two) times daily.    [provider]    Family History Family History  Problem Relation Age of Onset   Depression Mother    Arthritis Mother    Hypertension Mother    Anxiety disorder Mother    Heart murmur Father    Hyperlipidemia Maternal Grandmother    Hypertension Maternal Grandfather    Heart disease Maternal Grandfather    Diabetes Maternal Grandfather    COPD Maternal Grandfather    Heart attack Maternal Grandfather     Social History Social History   Tobacco Use   Smoking status: Former    Packs/day: 0.50    Types: Cigarettes   Smokeless tobacco: Never   Tobacco comments:    4 cigarettes per day  Vaping Use   Vaping Use: Former   Substances: Nicotine, Flavoring  Substance  Use Topics   Alcohol use: Yes    Alcohol/week: 2.0 standard drinks of alcohol    Types: 2 Cans of beer per week    Comment: occassional   Drug use: Yes    Types: Marijuana    Comment: daily     Allergies   Ortho tri-cyclen [norgestimate-eth estradiol]   Review of Systems Review of Systems  Respiratory:  Positive for cough.    Per HPI  Physical Exam Triage Vital Signs ED Triage Vitals  Enc Vitals Group     BP 06/30/22 0925 111/74     Pulse Rate 06/30/22 0925 68     Resp 06/30/22 0925 16     Temp 06/30/22 0925 98.7 F (37.1 C)     Temp Source 06/30/22 0925 Oral     SpO2 06/30/22 0925 95 %     Weight --      Height --      Head Circumference --      Peak Flow --      Pain Score 06/30/22 0928 0     Pain Loc --      Pain Edu? --      Excl. in GC? --    No data found.  Updated Vital Signs BP 111/74 (BP Location: Right Arm)   Pulse 68   Temp 98.7 F (37.1 C) (Oral)   Resp 16   LMP 06/30/2022   SpO2 95%   Visual Acuity Right Eye Distance:   Left Eye Distance:   Bilateral Distance:    Right Eye Near:   Left Eye Near:    Bilateral Near:     Physical Exam Vitals and nursing note reviewed.  Constitutional:      Appearance: She is not ill-appearing or toxic-appearing.  HENT:     Head: Normocephalic and atraumatic.     Right Ear: Hearing, tympanic membrane, ear canal and external ear normal.     Left Ear: Hearing, tympanic membrane, ear canal and external ear normal.     Nose: Rhinorrhea present.     Mouth/Throat:     Lips: Pink.     Mouth: Mucous membranes are moist.     Pharynx: No posterior oropharyngeal erythema.  Eyes:     General: Lids are normal. Vision grossly intact. Gaze aligned appropriately.     Extraocular Movements: Extraocular movements intact.     Conjunctiva/sclera: Conjunctivae normal.  Cardiovascular:     Rate and Rhythm: Normal rate and regular rhythm.     Heart sounds: Normal heart sounds, S1 normal and S2 normal.  Pulmonary:      Effort:  Pulmonary effort is normal. No respiratory distress.     Breath sounds: Normal breath sounds and air entry.  Musculoskeletal:     Cervical back: Neck supple.  Skin:    General: Skin is warm and dry.     Capillary Refill: Capillary refill takes less than 2 seconds.     Findings: No rash.  Neurological:     General: No focal deficit present.     Mental Status: She is alert and oriented to person, place, and time. Mental status is at baseline.     Cranial Nerves: No dysarthria or facial asymmetry.  Psychiatric:        Mood and Affect: Mood normal.        Speech: Speech normal.        Behavior: Behavior normal.        Thought Content: Thought content normal.        Judgment: Judgment normal.      UC Treatments / Results  Labs (all labs ordered are listed, but only abnormal results are displayed) Labs Reviewed  SARS CORONAVIRUS 2 (TAT 6-24 HRS)    EKG   Radiology No results found.  Procedures Procedures (including critical care time)  Medications Ordered in UC Medications - No data to display  Initial Impression / Assessment and Plan / UC Course  I have reviewed the triage vital signs and the nursing notes.  Pertinent labs & imaging results that were available during my care of the patient were reviewed by me and considered in my medical decision making (see chart for details).   1. Viral syndrome, screening for COVID-19 Symptoms and physical exam consistent with a viral upper respiratory tract infection that will likely resolve with rest, fluids, and prescriptions for symptomatic relief. Deferred imaging based on stable cardiopulmonary exam and hemodynamically stable vital signs.  COVID-19 testing is pending.  We will call patient if this is positive.  Quarantine guidelines discussed. Currently on day 4 of symptoms and does qualify for antiviral therapy.   May continue taking over the counter medications as directed for further symptomatic relief.  Recommend  Zyrtec and Flonase use for sneezing and rhinorrhea.  Nonpharmacologic interventions for symptom relief provided and after visit summary below. Advised to push fluids to stay well hydrated while recovering from viral illness.   Discussed physical exam and available lab work findings in clinic with patient.  Counseled patient regarding appropriate use of medications and potential side effects for all medications recommended or prescribed today. Discussed red flag signs and symptoms of worsening condition,when to call the PCP office, return to urgent care, and when to seek higher level of care in the emergency department. Patient verbalizes understanding and agreement with plan. All questions answered. Patient discharged in stable condition.   Final Clinical Impressions(s) / UC Diagnoses   Final diagnoses:  Viral syndrome  Encounter for screening for COVID-19  Nasal congestion     Discharge Instructions      You have a viral upper respiratory infection.  COVID-19 testing is pending. We will call you with results if positive. If your COVID test is positive, you must stay at home until day 6 of symptoms. On day 6, you may go out into public and go back to work, but you must wear a mask until day 11 of symptoms to prevent spread to others.  Use the following medicines to help with symptoms: -Cetirizine 10 mg once daily to help dry up nasal drainage. -Purchase Flonase over-the-counter and use 2  puffs into each nostril once daily to help with nasal congestion as well. - Tylenol 1,000mg  and/or ibuprofen 600mg  every 6 hours with food as needed for aches/pains or fever/chills.   1 tablespoon of honey in warm water and/or salt water gargles may also help with symptoms. Humidifier to your room will help add water to the air and reduce coughing.  If you develop any new or worsening symptoms, please return.  If your symptoms are severe, please go to the emergency room.  Follow-up with your primary care  provider for further evaluation and management of your symptoms as well as ongoing wellness visits.  I hope you feel better!      ED Prescriptions   None    PDMP not reviewed this encounter.   Talbot Grumbling, [place] 06/30/22 1023

## 2022-07-01 LAB — SARS CORONAVIRUS 2 (TAT 6-24 HRS): SARS Coronavirus 2: NEGATIVE

## 2022-07-18 ENCOUNTER — Encounter: Payer: Self-pay | Admitting: Family Medicine

## 2022-08-28 ENCOUNTER — Encounter: Payer: Self-pay | Admitting: Family Medicine

## 2022-08-28 DIAGNOSIS — B009 Herpesviral infection, unspecified: Secondary | ICD-10-CM

## 2022-08-29 ENCOUNTER — Encounter (HOSPITAL_BASED_OUTPATIENT_CLINIC_OR_DEPARTMENT_OTHER): Payer: Self-pay | Admitting: Emergency Medicine

## 2022-08-29 ENCOUNTER — Emergency Department (HOSPITAL_BASED_OUTPATIENT_CLINIC_OR_DEPARTMENT_OTHER)
Admission: EM | Admit: 2022-08-29 | Discharge: 2022-08-29 | Disposition: A | Discharge status: Home / Self Care | Payer: Medicaid Other | Attending: Emergency Medicine | Admitting: Emergency Medicine

## 2022-08-29 ENCOUNTER — Other Ambulatory Visit: Payer: Self-pay

## 2022-08-29 DIAGNOSIS — L989 Disorder of the skin and subcutaneous tissue, unspecified: Secondary | ICD-10-CM | POA: Diagnosis present

## 2022-08-29 DIAGNOSIS — Z87891 Personal history of nicotine dependence: Secondary | ICD-10-CM | POA: Insufficient documentation

## 2022-08-29 DIAGNOSIS — K610 Anal abscess: Secondary | ICD-10-CM | POA: Insufficient documentation

## 2022-08-29 DIAGNOSIS — F1099 Alcohol use, unspecified with unspecified alcohol-induced disorder: Secondary | ICD-10-CM | POA: Insufficient documentation

## 2022-08-29 DIAGNOSIS — J45909 Unspecified asthma, uncomplicated: Secondary | ICD-10-CM | POA: Insufficient documentation

## 2022-08-29 MED ORDER — NAPROXEN 500 MG PO TABS: 500.0000 mg | ORAL_TABLET | Freq: Two times a day (BID) | ORAL | 0 refills | Status: DC

## 2022-08-29 MED ORDER — SULFAMETHOXAZOLE-TRIMETHOPRIM 800-160 MG PO TABS: 1.0000 | ORAL_TABLET | Freq: Two times a day (BID) | ORAL | 0 refills | Status: AC

## 2022-08-29 MED ORDER — NAPROXEN 250 MG PO TABS
500.0000 mg | ORAL_TABLET | Freq: Once | ORAL | Status: AC
  Administered 2022-08-29: 500 mg via ORAL
  Filled 2022-08-29: qty 2

## 2022-08-29 MED ORDER — SULFAMETHOXAZOLE-TRIMETHOPRIM 800-160 MG PO TABS
1.0000 | ORAL_TABLET | Freq: Once | ORAL | Status: AC
  Administered 2022-08-29: 1 via ORAL
  Filled 2022-08-29: qty 1

## 2022-08-29 NOTE — Discharge Instructions (Signed)
You were evaluated in the Emergency Department and after careful evaluation, we did not find any emergent condition requiring admission or further testing in the hospital.  Your exam/testing today was overall reassuring.  Suspect pain due to a small boil or abscess.  Use warm compresses or soapy warm baths as we discussed and take the antibiotics as directed. Use the naprosyn as needed for pain.  Please return to the Emergency Department if you experience any worsening of your condition.  Thank you for allowing [place] to be a part of your care.

## 2022-08-29 NOTE — ED Triage Notes (Signed)
Pt states that she has a "bump" on her buttock that is painful x 1 week, worse x2 days.  Endorses h/o herpes that has spread to buttocks in past episodes. Denies lesions to genitals at this time.

## 2022-08-29 NOTE — ED Provider Notes (Signed)
[facility] Emergency Department Provider Note MRN:  [id number]  Arrival date & time: 08/29/22     Chief Complaint   Skin Lesion   History of Present Illness   Jenna Henry is a 27 y.o. year-old female with a history of PCOS, herpes presenting to the ED with chief complaint of skin lesion.  Pain and a nodule next to the anus.  Hurts to sit.  No fever.  Review of Systems  A thorough review of systems was obtained and all systems are negative except as noted in the HPI and PMH.   Patient's Health History    Past Medical History:  Diagnosis Date   Anxiety    Asthma    Depression    Herpes    PCOS (polycystic ovarian syndrome)    Polycystic ovaries     Past Surgical History:  Procedure Laterality Date   ANKLE RECONSTRUCTION Right 10/28/2021   Procedure: RIGHT ANTERIOR TALOFIBULAR LIGAMENT REPAIR AND SYNDESMOSIS FIXATION;  Surgeon: Newt Minion, MD;  Location: [place];  Service: Orthopedics;  Laterality: Right;  regional block   CYSTECTOMY     axillary area per patient    Family History  Problem Relation Age of Onset   Depression Mother    Arthritis Mother    Hypertension Mother    Anxiety disorder Mother    Heart murmur Father    Hyperlipidemia Maternal Grandmother    Hypertension Maternal Grandfather    Heart disease Maternal Grandfather    Diabetes Maternal Grandfather    COPD Maternal Grandfather    Heart attack Maternal Grandfather     Social History   Socioeconomic History   Marital status: Single    Spouse name: Not on file   Number of children: Not on file   Years of education: Not on file   Highest education level: Not on file  Occupational History   Not on file  Tobacco Use   Smoking status: Former    Packs/day: .5    Types: Cigarettes   Smokeless tobacco: Never   Tobacco comments:    4 cigarettes per day  Vaping Use   Vaping Use: Former   Substances: Nicotine, Flavoring  Substance and Sexual  Activity   Alcohol use: Yes    Alcohol/week: 2.0 standard drinks of alcohol    Types: 2 Cans of beer per week    Comment: occassional   Drug use: Yes    Types: Marijuana    Comment: daily   Sexual activity: Yes    Birth control/protection: None  Other Topics Concern   Not on file  Social History Narrative   Not on file   Social Determinants of Health   Financial Resource Strain: Not on file  Food Insecurity: Food Insecurity Present (02/22/2019)   Hunger Vital Sign    Worried About Running Out of Food in the Last Year: Often true    Ran Out of Food in the Last Year: Often true  Transportation Needs: Unmet Transportation Needs (02/22/2019)   PRAPARE - Hydrologist (Medical): Yes    Lack of Transportation (Non-Medical): Yes  Physical Activity: Not on file  Stress: Not on file  Social Connections: Not on file  Intimate Partner Violence: Not on file     Physical Exam   Vitals:   08/29/22 0643  BP: 108/77  Pulse: 74  Resp: 20  Temp: 98.4 F (36.9 C)  SpO2: 95%    CONSTITUTIONAL:  well-appearing,  NAD NEURO/PSYCH:  Alert and oriented x 3, no focal deficits EYES:  eyes equal and reactive ENT/NECK:  no LAD, no JVD CARDIO:  regular rate, well-perfused, normal S1 and S2 PULM:  CTAB no wheezing or rhonchi GI/GU:  non-distended, non-tender MSK/SPINE:  No gross deformities, no edema SKIN:  small tender perianal nodule   *Additional and/or pertinent findings included in MDM below  Diagnostic and Interventional Summary    EKG Interpretation  Date/Time:    Ventricular Rate:    PR Interval:    QRS Duration:   QT Interval:    QTC Calculation:   R Axis:     Text Interpretation:         Labs Reviewed - No data to display  No orders to display    Medications  sulfamethoxazole-trimethoprim (BACTRIM DS) 800-160 MG per tablet 1 tablet (has no administration in time range)  naproxen (NAPROSYN) tablet 500 mg (has no administration in time  range)     Procedures  /  Critical Care Procedures  ED Course and Medical Decision Making  Initial Impression and Ddx Exam most consistent with very small perianal abscess.  Not vesicular and does not have an erythematous base.  Doubt herpes.  Tender nodule with central skin breakdown and white head.  Given the small size and how it appears that if will come to a head soon, I&D deferred at this time.    Past medical/surgical history that increases complexity of ED encounter:  herpes  Interpretation of Diagnostics none  Patient Reassessment and Ultimate Disposition/Management     Discharge.  Patient management required discussion with the following services or consulting groups:  None  Complexity of Problems Addressed Acute complicated illness or Injury  Additional Data Reviewed and Analyzed Further history obtained from: None  Additional Factors Impacting ED Encounter Risk Prescriptions  Barth Kirks. [place] Small, [place] [email or web address]  Final Clinical Impressions(s) / ED Diagnoses     ICD-10-CM   1. Perianal abscess  K61.0       ED Discharge Orders          Ordered    sulfamethoxazole-trimethoprim (BACTRIM DS) 800-160 MG tablet  2 times daily        08/29/22 0650    naproxen (NAPROSYN) 500 MG tablet  2 times daily        08/29/22 [id number]             Discharge Instructions Discussed with and Provided to Patient:    Discharge Instructions      You were evaluated in the Emergency Department and after careful evaluation, we did not find any emergent condition requiring admission or further testing in the hospital.  Your exam/testing today was overall reassuring.  Suspect pain due to a small boil or abscess.  Use warm compresses or soapy warm baths as we discussed and take the antibiotics as directed. Use the naprosyn as needed for pain.  Please return to the Emergency Department if you experience any  worsening of your condition.  Thank you for allowing [place] to be a part of your care.       Maudie Flakes, MD 08/29/22 [phone]

## 2022-08-31 MED ORDER — VALACYCLOVIR HCL 1 G PO TABS: 1000.0000 mg | ORAL_TABLET | Freq: Two times a day (BID) | ORAL | 2 refills | Status: DC

## 2022-09-22 ENCOUNTER — Ambulatory Visit (INDEPENDENT_AMBULATORY_CARE_PROVIDER_SITE_OTHER): Payer: Medicaid Other | Admitting: Family Medicine

## 2022-09-22 VITALS — BP 100/70 | HR 62 | Temp 98.2°F | Ht 65.0 in | Wt 186.2 lb

## 2022-09-22 DIAGNOSIS — L732 Hidradenitis suppurativa: Secondary | ICD-10-CM

## 2022-09-22 MED ORDER — DOXYCYCLINE HYCLATE 100 MG PO TABS: 100.0000 mg | ORAL_TABLET | Freq: Every day | ORAL | 0 refills | Status: DC

## 2022-09-22 MED ORDER — SULFAMETHOXAZOLE-TRIMETHOPRIM 800-160 MG PO TABS: 1.0000 | ORAL_TABLET | Freq: Two times a day (BID) | ORAL | 0 refills | Status: AC

## 2022-09-22 NOTE — Assessment & Plan Note (Signed)
Overall much improved on the current treatment however she does have an acute draining cyst in the right axilla. Will treat with Bactrim DS 1 tab BID for 7 days then continue the doxycycline 100 mg daily for the next 2 months. I advised she use benzoyl peroxide washes and cream daily to help reduce reinfection. RTC for annual physical and pap.

## 2022-09-22 NOTE — Patient Instructions (Addendum)
Benzoyl peroxide cream  Benozoyl peroxide wash--- Pan Oxyl is the brand 10% benzoyl wash  HOLD your doxycycline while taking the bactrim-- then resume the doxy after bactrim is complete.

## 2022-09-22 NOTE — Progress Notes (Signed)
Established Patient Office Visit  Subjective   Patient ID: Jenna Henry, female    DOB: Apr 27, 1996  Age: 27 y.o. MRN: [id number]  Chief Complaint  Patient presents with   Cyst    Patient states she has recurrent right axillary cysts    Patient is here for follow up on hydradenitis in her axilla. She reports that it was getting much better for a while, states that the left axilla is completely cleared up and there is no pain or drainage from that area. States that the right axilla was better but then about a few days ago she noticed that the drainage was back and there was some tenderness. No fever/chills. States that she is tolerating the metformin fairly well, occasional stomach upset. We reviewed her allergy to the ortho cyclen, she reported rash all over with this medication, we discussed possibly using spironolactone as well.    Current Outpatient Medications  Medication Instructions   albuterol (VENTOLIN HFA) 108 (90 Base) MCG/ACT inhaler Inhale 2 puffs into the lungs every 4 (four) hours as needed for wheezing or shortness of breath. (Must have office visit for refills)   doxycycline (VIBRA-TABS) 100 mg, Oral, Daily   metFORMIN (GLUCOPHAGE) 500 mg, Oral, 2 times daily with meals   sulfamethoxazole-trimethoprim (BACTRIM DS) 800-160 MG tablet 1 tablet, Oral, 2 times daily   valACYclovir (VALTREX) 1,000 mg, Oral, 2 times daily    Patient Active Problem List   Diagnosis Date Noted   Hydradenitis 06/23/2022   PCOS (polycystic ovarian syndrome) 06/23/2022   Mild intermittent asthma 06/23/2022   Sprain of anterior talofibular ligament of right ankle    Syndesmotic disruption of right ankle    Head trauma, sequela 06/03/2021   New daily persistent headache 06/03/2021   Dizziness and giddiness 06/03/2021   MDD (major depressive disorder) 10/10/2018   Hypokalemia 10/09/2018   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     Objective:     BP 100/70 (BP Location: Left Arm, Patient Position: Sitting, Cuff Size: Normal)   Pulse 62   Temp 98.2 F (36.8 C) (Oral)   Ht  (1.651 m)   Wt 186 lb 3.2 oz (84.5 kg)   LMP 08/05/2022 (Exact Date)   SpO2 97%   BMI 30.99 kg/m     Physical Exam Vitals reviewed.  Constitutional:      Appearance: Normal appearance. She is well-groomed and normal weight.  Eyes:     Conjunctiva/sclera: Conjunctivae normal.  Cardiovascular:     Rate and Rhythm: Normal rate and regular rhythm.     Pulses: Normal pulses.     Heart sounds: S1 normal and S2 normal.  Pulmonary:     Effort: Pulmonary effort is normal.     Breath sounds: Normal breath sounds and air entry.  Abdominal:     General: Bowel sounds are normal.  Skin:    Findings: Lesion (right axilla there is a draining cyst that is tender to palpation, no redness or induration of the skin) present.  Neurological:     Mental Status: She is alert and oriented to person, place, and time. Mental status is at baseline.     Gait: Gait is intact.  Psychiatric:  Mood and Affect: Mood and affect normal.        Speech: Speech normal.        Behavior: Behavior normal.        Judgment: Judgment normal.      No results found for any visits on 09/22/22.     The ASCVD Risk score (Arnett DK, et al., 2019) failed to calculate for the following reasons:   The 2019 ASCVD risk score is only valid for ages 25 to 56    Assessment & Plan:   Problem List Items Addressed This Visit       Unprioritized   Hydradenitis - Primary (Chronic)    Overall much improved on the current treatment however she does have an acute draining cyst in the right axilla. Will treat with Bactrim DS 1 tab BID for 7 days then continue the doxycycline  100 mg daily for the next 2 months. I advised she use benzoyl peroxide washes and cream daily to help reduce reinfection. RTC for annual physical and pap.      Relevant Medications   sulfamethoxazole-trimethoprim (BACTRIM DS) 800-160 MG tablet   doxycycline (VIBRA-TABS) 100 MG tablet    Return for annual physical with pap smear-- per patient's schedule.    [name], MD

## 2022-10-22 ENCOUNTER — Encounter: Payer: Self-pay | Admitting: Family Medicine

## 2022-10-22 ENCOUNTER — Other Ambulatory Visit (HOSPITAL_COMMUNITY)
Admission: RE | Admit: 2022-10-22 | Discharge: 2022-10-22 | Disposition: A | Discharge status: Home / Self Care | Payer: Medicaid Other | Source: Ambulatory Visit | Attending: Family Medicine | Admitting: Family Medicine

## 2022-10-22 ENCOUNTER — Ambulatory Visit (INDEPENDENT_AMBULATORY_CARE_PROVIDER_SITE_OTHER): Payer: Medicaid Other | Admitting: Family Medicine

## 2022-10-22 VITALS — BP 112/80 | HR 65 | Temp 98.5°F | Ht 65.0 in | Wt 184.0 lb

## 2022-10-22 DIAGNOSIS — Z124 Encounter for screening for malignant neoplasm of cervix: Secondary | ICD-10-CM | POA: Insufficient documentation

## 2022-10-22 DIAGNOSIS — Z01419 Encounter for gynecological examination (general) (routine) without abnormal findings: Secondary | ICD-10-CM

## 2022-10-22 DIAGNOSIS — Z0000 Encounter for general adult medical examination without abnormal findings: Secondary | ICD-10-CM | POA: Diagnosis not present

## 2022-10-22 NOTE — Progress Notes (Signed)
Complete physical exam  Patient: Jenna Henry   DOB: 26-Feb-1996   26 y.o. Female  MRN: [id number]  Subjective:    Chief Complaint  Patient presents with   Annual Exam    Jenna Henry is a 27 y.o. female who presents today for a complete physical exam. She reports consuming a general diet. The patient has a physically strenuous job, but has no regular exercise apart from work.  She generally feels well. She reports sleeping fairly well. She does not have additional problems to discuss today.   LMP -- March 6,2024. Pt is sexually active, uses barrier method. Pt has PCOS so has irregular bleeding.   Most recent fall risk assessment:    05/14/2021   10:29 AM  Fall Risk   Falls in the past year? 1  Number falls in past yr: 0     Most recent depression screenings:    10/22/2022   10:39 AM 09/22/2022   11:02 AM  PHQ 2/9 Scores  PHQ - 2 Score 3 0  PHQ- 9 Score 7 2    Vision:Not within last year  and Dental: No current dental problems and No regular dental care   Patient Active Problem List   Diagnosis Date Noted   Hydradenitis 06/23/2022   PCOS (polycystic ovarian syndrome) 06/23/2022   Mild intermittent asthma 06/23/2022   Sprain of anterior talofibular ligament of right ankle    Syndesmotic disruption of right ankle    Head trauma, sequela 06/03/2021   New daily persistent headache 06/03/2021   Dizziness and giddiness 06/03/2021   MDD (major depressive disorder) 10/10/2018   Hypokalemia 10/09/2018   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 (Must have office visit for refills)   doxycycline (VIBRA-TABS) 100 MG tablet Take 1 tablet (100 mg total) by mouth daily.   metFORMIN (GLUCOPHAGE) 500 MG tablet Take 1 tablet (500 mg total) by mouth 2 (two) times daily with a meal.   valACYclovir (VALTREX) 1000 MG tablet Take 1 tablet (1,000 mg total) by mouth 2 (two) times daily.   No facility-administered medications prior to visit.    Review of Systems  HENT:  Negative for hearing loss.   Eyes:  Negative for blurred vision.  Respiratory:  Negative for shortness of breath.   Cardiovascular:  Negative for chest pain.  Gastrointestinal: Negative.   Genitourinary: Negative.   Musculoskeletal:  Negative for back pain.  Neurological:  Negative for headaches.  Psychiatric/Behavioral:  Negative for depression.   All other systems reviewed and are negative.      Objective:     BP 112/80 (BP Location: Left Arm, Patient Position: Sitting, Cuff Size: Normal)   Pulse 65   Temp 98.5 F (36.9 C) (Oral)   Ht 5\' 5"  (1.651 m)   Wt 184 lb (83.5 kg)   LMP 08/05/2022 (Exact Date)   SpO2 95%   BMI 30.62 kg/m    Physical Exam Vitals reviewed. Exam conducted with a chaperone present.  Constitutional:  Appearance: She is obese.  Cardiovascular:     Rate and Rhythm: Normal rate and regular rhythm.     Pulses: Normal pulses.     Heart sounds: Normal heart sounds.  Pulmonary:     Effort: Pulmonary effort is normal.     Breath sounds: Normal breath sounds.  Chest:     Chest wall: No mass.  Breasts:    Tanner Score is 5.     Right: Normal. No mass or tenderness.     Left: Normal. No mass or tenderness.  Abdominal:     General: Abdomen is flat. Bowel sounds are normal.     Palpations: Abdomen is soft.  Genitourinary:    General: Normal vulva.     Exam position: Lithotomy position.     Tanner stage (genital): 5.     Vagina: Normal.     Cervix: Normal.     Uterus: Normal.       Adnexa: Right adnexa normal and left adnexa normal.     Rectum: Normal.  Lymphadenopathy:     Upper Body:     Right upper body: No axillary adenopathy.     Left upper body: No axillary adenopathy.  Neurological:     General: No focal deficit present.     Mental Status: She is alert and oriented to person, place, and time.  Psychiatric:        Mood and Affect: Mood and affect normal.     No results found for any visits on 10/22/22. Last metabolic panel Lab Results  Component Value Date   GLUCOSE 80 06/25/2022   NA 136 06/25/2022   K 3.8 06/25/2022   CL 105 06/25/2022   CO2 22 06/25/2022   BUN 6 06/25/2022   CREATININE 0.59 06/25/2022   GFRNONAA >60 05/16/2021   CALCIUM 10.4 06/25/2022   PROT 7.6 06/25/2022   ALBUMIN 4.3 06/25/2022   LABGLOB 2.8 09/24/2017   AGRATIO 1.6 09/24/2017   BILITOT 0.6 06/25/2022   ALKPHOS 56 06/25/2022   AST 19 06/25/2022   ALT 20 06/25/2022   ANIONGAP 7 05/16/2021        Assessment & Plan:    Routine Health Maintenance and Physical Exam  Immunization History  Administered Date(s) Administered   Tdap 03/03/2022    Health Maintenance  Topic Date Due   HPV VACCINES (1 - 2-dose series) Never done   PAP-Cervical Cytology Screening  02/21/2022   PAP SMEAR-Modifier  02/21/2022   Hepatitis C Screening  06/24/2023 (Originally 01/07/2016)   COVID-19 Vaccine (1) 09/22/2023 (Originally 07[birth year]09/1998)   INFLUENZA VACCINE  12/31/2022   CHLAMYDIA SCREENING  03/23/2023   DTaP/Tdap/Td (2 - Td or Tdap) 03/03/2032   HIV Screening  Completed    Discussed health benefits of physical activity, and encouraged her to engage in regular exercise appropriate for her age and condition.  Routine general medical examination at a health care facility  Well woman exam with routine gynecological exam  Cervical cancer screening -     Cytology - PAP  Normal physical exam findings, handouts given on healthy eating and exercise, encouraged to increase exercise when  she can. Pap smear sent with STI screening.  Return in 1 year (on 10/22/2023).     [name], MD

## 2022-10-22 NOTE — Patient Instructions (Signed)

## 2022-10-28 LAB — CYTOLOGY - PAP
Chlamydia: NEGATIVE
Comment: NEGATIVE
Comment: NEGATIVE
Comment: NEGATIVE
Comment: NORMAL
Diagnosis: NEGATIVE
High risk HPV: NEGATIVE
Neisseria Gonorrhea: NEGATIVE
Trichomonas: NEGATIVE

## 2022-11-05 ENCOUNTER — Emergency Department (HOSPITAL_BASED_OUTPATIENT_CLINIC_OR_DEPARTMENT_OTHER)
Admission: EM | Admit: 2022-11-05 | Discharge: 2022-11-05 | Disposition: A | Discharge status: Home / Self Care | Payer: Medicaid Other | Attending: Emergency Medicine | Admitting: Emergency Medicine

## 2022-11-05 ENCOUNTER — Encounter: Payer: Self-pay | Admitting: Family Medicine

## 2022-11-05 ENCOUNTER — Encounter (HOSPITAL_BASED_OUTPATIENT_CLINIC_OR_DEPARTMENT_OTHER): Payer: Self-pay | Admitting: Emergency Medicine

## 2022-11-05 ENCOUNTER — Other Ambulatory Visit: Payer: Self-pay

## 2022-11-05 DIAGNOSIS — Z7984 Long term (current) use of oral hypoglycemic drugs: Secondary | ICD-10-CM | POA: Insufficient documentation

## 2022-11-05 DIAGNOSIS — R112 Nausea with vomiting, unspecified: Secondary | ICD-10-CM | POA: Diagnosis present

## 2022-11-05 DIAGNOSIS — R197 Diarrhea, unspecified: Secondary | ICD-10-CM | POA: Diagnosis not present

## 2022-11-05 LAB — COMPREHENSIVE METABOLIC PANEL
ALT: 17 U/L (ref 0–44)
AST: 15 U/L (ref 15–41)
Albumin: 4.5 g/dL (ref 3.5–5.0)
Alkaline Phosphatase: 54 U/L (ref 38–126)
Anion gap: 5 (ref 5–15)
BUN: 6 mg/dL (ref 6–20)
CO2: 26 mmol/L (ref 22–32)
Calcium: 10.7 mg/dL — ABNORMAL HIGH (ref 8.9–10.3)
Chloride: 106 mmol/L (ref 98–111)
Creatinine, Ser: 0.72 mg/dL (ref 0.44–1.00)
GFR, Estimated: >60 mL/min (ref >60)
Glucose, Bld: 89 mg/dL (ref 70–99)
Potassium: 4 mmol/L (ref 3.5–5.1)
Sodium: 137 mmol/L (ref 135–145)
Total Bilirubin: 0.4 mg/dL (ref 0.3–1.2)
Total Protein: 7.5 g/dL (ref 6.5–8.1)

## 2022-11-05 LAB — CBC
HCT: 40 % (ref 36.0–46.0)
Hemoglobin: 13.6 g/dL (ref 12.0–15.0)
MCH: 32.5 pg (ref 26.0–34.0)
MCHC: 34 g/dL (ref 30.0–36.0)
MCV: 95.5 fL (ref 80.0–100.0)
Platelets: 245 10*3/uL (ref 150–400)
RBC: 4.19 MIL/uL (ref 3.87–5.11)
RDW: 13.8 % (ref 11.5–15.5)
WBC: 5.4 10*3/uL (ref 4.0–10.5)
nRBC: 0 % (ref 0.0–0.2)

## 2022-11-05 LAB — OCCULT BLOOD X 1 CARD TO LAB, STOOL: Fecal Occult Bld: NEGATIVE

## 2022-11-05 NOTE — ED Triage Notes (Signed)
Pt arrives pov, steady gait c/o generalized ABD pain with nausea x 5 days, bloody stool since yesterday.

## 2022-11-05 NOTE — ED Provider Notes (Signed)
[place] EMERGENCY DEPARTMENT AT [facility] Provider Note   CSN: [id number] Arrival date & time: 11/05/22  1132     History  Chief Complaint  Patient presents with   Abdominal Pain    Jenna Henry is a 27 y.o. female.  HPI 27 year old female history of polycystic ovarian disease, LMP last week presents today complaining of some crampy abdominal discomfort with nausea and now with diarrhea.  She reports she has had some yellow orangey colored diarrhea for the past several days.  She spoke with her primary care physician was advised to come to the ED for further evaluation.  She has not had fever, chills, active vomiting.  She denies any UTI symptoms.  Her last menstrual cycle was last week and was normal.     Home Medications Prior to Admission medications   Medication Sig Start Date End Date Taking? Authorizing Provider  albuterol (VENTOLIN HFA) 108 (90 Base) MCG/ACT inhaler Inhale 2 puffs into the lungs every 4 (four) hours as needed for wheezing or shortness of breath. (Must have office visit for refills) 06/23/22   [name], MD  doxycycline (VIBRA-TABS) 100 MG tablet Take 1 tablet (100 mg total) by mouth daily. 09/22/22   [name], MD  metFORMIN (GLUCOPHAGE) 500 MG tablet Take 1 tablet (500 mg total) by mouth 2 (two) times daily with a meal. 06/23/22   [name], MD  valACYclovir (VALTREX) 1000 MG tablet Take 1 tablet (1,000 mg total) by mouth 2 (two) times daily. 08/31/22   [name], MD      Allergies    Ortho tri-cyclen [norgestimate-eth estradiol]    Review of Systems   Review of Systems  Physical Exam Updated Vital Signs BP 103/71   Pulse (!) 57   Temp 98.3 F (36.8 C) (Oral)   Resp 18   Ht 1.651 m (5\' 5" )   Wt 83 kg   LMP 10/26/2022   SpO2 99%   BMI 30.45 kg/m  Physical Exam Vitals reviewed.  Constitutional:      Appearance: She is well-developed.  HENT:     Head: Normocephalic.     Mouth/Throat:     Mouth:  Mucous membranes are moist.  Eyes:     Extraocular Movements: Extraocular movements intact.  Cardiovascular:     Rate and Rhythm: Normal rate and regular rhythm.  Pulmonary:     Effort: Pulmonary effort is normal.  Abdominal:     General: Abdomen is flat. Bowel sounds are normal.     Palpations: Abdomen is soft.     Tenderness: There is no abdominal tenderness.  Genitourinary:    Rectum: Normal. No mass or tenderness.     Comments: Rectal exam performed and some yellow-colored stool noted no masses or tenderness Skin:    General: Skin is warm and dry.  Neurological:     Mental Status: She is alert.     ED Results / Procedures / Treatments   Labs (all labs ordered are listed, but only abnormal results are displayed) Labs Reviewed  COMPREHENSIVE METABOLIC PANEL - Abnormal; Notable for the following components:      Result Value   Calcium 10.7 (*)    All other components within normal limits  CBC  OCCULT BLOOD X 1 CARD TO LAB, STOOL    EKG None  Radiology No results found.  Procedures Procedures    Medications Ordered in ED Medications - No data to display  ED Course/ Medical Decision Making/ A&P  Medical Decision Making Amount and/or Complexity of Data Reviewed Labs: ordered.   27 year old female with several days of nausea vomiting followed by several days of diarrhea.  Patient presents due to some yellow to orange color of her stool.  She had some concern for bleeding.  No gross blood was noted. Differential diagnosis includes but is not limited to gastroenteritis, GI bleeding, other bleeding to include vaginal bleeding Patient evaluated here with exam with rectal exam that showed some yellowish Discussed stool Hemoccult is negative Labs are stable Patient is appears stable for discharge. She is advised of oral replenishment, need for follow-up, and return precautions        Final Clinical Impression(s) / ED  Diagnoses Final diagnoses:  Nausea vomiting and diarrhea    Rx / DC Orders ED Discharge Orders     None         Margarita Grizzle, MD 11/05/22 1331

## 2022-11-06 ENCOUNTER — Emergency Department (HOSPITAL_COMMUNITY)
Admission: EM | Admit: 2022-11-06 | Discharge: 2022-11-06 | Disposition: A | Discharge status: Home / Self Care | Payer: Medicaid Other | Attending: Emergency Medicine | Admitting: Emergency Medicine

## 2022-11-06 ENCOUNTER — Emergency Department (HOSPITAL_COMMUNITY): Payer: Medicaid Other

## 2022-11-06 ENCOUNTER — Other Ambulatory Visit: Payer: Self-pay

## 2022-11-06 DIAGNOSIS — K2900 Acute gastritis without bleeding: Secondary | ICD-10-CM | POA: Diagnosis not present

## 2022-11-06 DIAGNOSIS — R109 Unspecified abdominal pain: Secondary | ICD-10-CM | POA: Diagnosis present

## 2022-11-06 LAB — URINALYSIS, ROUTINE W REFLEX MICROSCOPIC
Bilirubin Urine: NEGATIVE
Glucose, UA: NEGATIVE mg/dL
Ketones, ur: NEGATIVE mg/dL
Leukocytes,Ua: NEGATIVE
Nitrite: NEGATIVE
Protein, ur: NEGATIVE mg/dL
Specific Gravity, Urine: 1.004 — ABNORMAL LOW (ref 1.005–1.030)
pH: 6 (ref 5.0–8.0)

## 2022-11-06 LAB — PREGNANCY, URINE: Preg Test, Ur: NEGATIVE

## 2022-11-06 MED ORDER — FAMOTIDINE 20 MG PO TABS: 20.0000 mg | ORAL_TABLET | Freq: Two times a day (BID) | ORAL | 0 refills | Status: DC

## 2022-11-06 MED ORDER — ALUM & MAG HYDROXIDE-SIMETH 200-200-20 MG/5ML PO SUSP
30.0000 mL | Freq: Once | ORAL | Status: AC
  Administered 2022-11-06: 30 mL via ORAL
  Filled 2022-11-06: qty 30

## 2022-11-06 MED ORDER — FAMOTIDINE 20 MG PO TABS
20.0000 mg | ORAL_TABLET | Freq: Once | ORAL | Status: AC
  Administered 2022-11-06: 20 mg via ORAL
  Filled 2022-11-06: qty 1

## 2022-11-06 NOTE — ED Notes (Signed)
US at bedside

## 2022-11-06 NOTE — ED Triage Notes (Signed)
Pt. Stated, I was at [facility] yesterday with same symptoms of Abdominal and some nausea. My stool is a different color. The Dr. Charline Bills, All my test were normal and sent me home. Im still having stomach pain and my stool is a color of orange.

## 2022-11-06 NOTE — ED Provider Notes (Signed)
[place] EMERGENCY DEPARTMENT AT [facility] Provider Note   CSN: [id number] Arrival date & time: 11/06/22  0845     History  Chief Complaint  Patient presents with   Abdominal Pain   Nausea    Jenna Henry is a 27 y.o. female.  Patient is a 27 year old female with history of PCOS, hidradenitis on chronic doxycycline therapy who is presenting today with persistent abdominal pain and loose stools.  Patient reports her symptoms have now been present since Sunday.  She reports the pain has been in the same spot the whole time which is mostly the upper abdomen she cannot really recall anything that seems to make it better or worse but she does have some nausea and she reports feeling scared to eat and is only been having some saltine crackers.  Since Wednesday she is started having loose stools which she feels are very odd color.  Usually 4-5 stools per day.  She has not had a fever denies prior abdominal surgeries.  Denies history of similar abdominal pain.  She will get pain from her PCOS but reports is never felt like this before.  Denies bad food exposure or recent travel.  No known sick contacts.  No medication changes.  Denies over-the-counter NSAID use, aspirin use no prior history of ulcers.  Patient was seen at [facility] yesterday and she reports they told her her labs were normal and sent her home but she states she is still having discomfort and is not sure what to do about the pain.  The history is provided by the patient.  Abdominal Pain      Home Medications Prior to Admission medications   Medication Sig Start Date End Date Taking? Authorizing Provider  famotidine (PEPCID) 20 MG tablet Take 1 tablet (20 mg total) by mouth 2 (two) times daily. 11/06/22  Yes Radin Raptis, Alphonzo Lemmings, MD  albuterol (VENTOLIN HFA) 108 (90 Base) MCG/ACT inhaler Inhale 2 puffs into the lungs every 4 (four) hours as needed for wheezing or shortness of breath. (Must have office visit for refills)  06/23/22   [name], MD  doxycycline (VIBRA-TABS) 100 MG tablet Take 1 tablet (100 mg total) by mouth daily. 09/22/22   [name], MD  metFORMIN (GLUCOPHAGE) 500 MG tablet Take 1 tablet (500 mg total) by mouth 2 (two) times daily with a meal. 06/23/22   [name], MD  valACYclovir (VALTREX) 1000 MG tablet Take 1 tablet (1,000 mg total) by mouth 2 (two) times daily. 08/31/22   [name], MD      Allergies    Ortho tri-cyclen [norgestimate-eth estradiol]    Review of Systems   Review of Systems  Gastrointestinal:  Positive for abdominal pain.    Physical Exam Updated Vital Signs BP 113/69   Pulse (!) 57   Temp 98.4 F (36.9 C)   Resp 18   LMP 10/26/2022   SpO2 100%  Physical Exam Vitals and nursing note reviewed.  Constitutional:      General: She is not in acute distress.    Appearance: She is well-developed.  HENT:     Head: Normocephalic and atraumatic.  Eyes:     Pupils: Pupils are equal, round, and reactive to light.  Cardiovascular:     Rate and Rhythm: Normal rate and regular rhythm.     Heart sounds: Normal heart sounds. No murmur heard.    No friction rub.  Pulmonary:     Effort: Pulmonary effort is normal.  Breath sounds: Normal breath sounds. No wheezing or rales.  Abdominal:     General: Bowel sounds are normal. There is no distension.     Palpations: Abdomen is soft.     Tenderness: There is abdominal tenderness in the right upper quadrant and epigastric area. There is no guarding or rebound. Positive signs include Murphy's sign.  Musculoskeletal:        General: No tenderness. Normal range of motion.     Comments: No edema  Skin:    General: Skin is warm and dry.     Findings: No rash.  Neurological:     Mental Status: She is alert and oriented to person, place, and time.     Cranial Nerves: No cranial nerve deficit.  Psychiatric:        Behavior: Behavior normal.     ED Results / Procedures / Treatments    Labs (all labs ordered are listed, but only abnormal results are displayed) Labs Reviewed  URINALYSIS, ROUTINE W REFLEX MICROSCOPIC - Abnormal; Notable for the following components:      Result Value   Color, Urine STRAW (*)    Specific Gravity, Urine 1.004 (*)    Hgb urine dipstick SMALL (*)    Bacteria, UA RARE (*)    All other components within normal limits  PREGNANCY, URINE    EKG None  Radiology US Abdomen Limited RUQ (LIVER/GB)  Result Date: 11/06/2022 CLINICAL DATA:  Abdominal pain EXAM: ULTRASOUND ABDOMEN LIMITED RIGHT UPPER QUADRANT COMPARISON:  CT 11/08/2020 and older.  Ultrasound 12/03/2016 FINDINGS: Gallbladder: No gallstones or wall thickening visualized. No sonographic Murphy sign noted by sonographer. Common bile duct: Diameter: 4 mm Liver: No focal lesion identified. Within normal limits in parenchymal echogenicity. Portal vein is patent on color Doppler imaging with normal direction of blood flow towards the liver. Other: None. IMPRESSION: No gallstones or ductal dilatation. Electronically Signed   By: Karen Kays M.D.   On: 11/06/2022 12:56    Procedures Procedures    Medications Ordered in ED Medications  alum & mag hydroxide-simeth (MAALOX/MYLANTA) 200-200-20 MG/5ML suspension 30 mL (30 mLs Oral Given 11/06/22 1115)  famotidine (PEPCID) tablet 20 mg (20 mg Oral Given 11/06/22 1115)    ED Course/ Medical Decision Making/ A&P                             Medical Decision Making Amount and/or Complexity of Data Reviewed Labs: ordered. Decision-making details documented in ED Course. Radiology: ordered and independent interpretation performed. Decision-making details documented in ED Course.  Risk OTC drugs.   Pt presenting today with a complaint that caries a high risk for morbidity and mortality.  Here today with complaint of ongoing abdominal pain.  Patient was evaluated yesterday at [facility] with a normal CBC, CMP and negative Hemoccult.  Patient's  symptoms have been increasing and now she is having loose stool as well.  On exam she does have right upper quadrant pain and concern for possible gallbladder pathology.  No findings to suggest diverticulitis or appendicitis but also possibility for colitis.  Lower suspicion for pregnancy but patient did not have a urine pregnancy done yesterday so we will confirm today.  Will also get an ultrasound.  Patient does not appear dehydrated at this time.  Will give Pepcid, GI cocktail.  Findings discussed with the patient from her evaluation yesterday and plan going forward which she is comfortable with.  I have independently visualized  and interpreted pt's images today.  Ultrasound negative today for gallstones.  Radiology reports no acute findings.  I independently interpreted patient's labs and UA and UPT are negative.  Went in and discussed the findings with the patient.  Also discussed with her risk and benefits for CT versus treating for gastritis and return for worsening symptoms.  At this time with shared disposition making will treat with Pepcid and have patient return if her symptoms worsen.  At this time patient is stable for discharge.  No indication for admission.          Final Clinical Impression(s) / ED Diagnoses Final diagnoses:  Acute gastritis without hemorrhage, unspecified gastritis type    Rx / DC Orders ED Discharge Orders          Ordered    famotidine (PEPCID) 20 MG tablet  2 times daily        11/06/22 1326              Gwyneth Sprout, MD 11/06/22 1330

## 2022-11-06 NOTE — Discharge Instructions (Addendum)
Everything with your liver, kidneys, pancreas looks good.  No signs of gallstones today.  This may be related to irritation of your stomach lining and reflux.  We will start you on antacids for the next 2 weeks.  Also try to avoid any type of spicy foods or acidic foods and try not to eat 1 to 2 hours before you lay down to go to sleep.  If your symptoms get worse you start having fever, vomiting return to the emergency room, otherwise if they are not getting worse but just not going away follow-up with your doctor.

## 2022-11-10 ENCOUNTER — Other Ambulatory Visit: Payer: Self-pay

## 2022-11-10 ENCOUNTER — Emergency Department (HOSPITAL_COMMUNITY)
Admission: EM | Admit: 2022-11-10 | Discharge: 2022-11-10 | Disposition: A | Discharge status: Home / Self Care | Payer: Medicaid Other | Attending: Emergency Medicine | Admitting: Emergency Medicine

## 2022-11-10 ENCOUNTER — Emergency Department (HOSPITAL_COMMUNITY): Payer: Medicaid Other

## 2022-11-10 ENCOUNTER — Encounter (HOSPITAL_COMMUNITY): Payer: Self-pay

## 2022-11-10 ENCOUNTER — Encounter: Payer: Self-pay | Admitting: Family Medicine

## 2022-11-10 DIAGNOSIS — R1013 Epigastric pain: Secondary | ICD-10-CM | POA: Diagnosis not present

## 2022-11-10 DIAGNOSIS — J45909 Unspecified asthma, uncomplicated: Secondary | ICD-10-CM | POA: Diagnosis not present

## 2022-11-10 DIAGNOSIS — R109 Unspecified abdominal pain: Secondary | ICD-10-CM | POA: Diagnosis present

## 2022-11-10 LAB — COMPREHENSIVE METABOLIC PANEL
ALT: 21 U/L (ref 0–44)
AST: 23 U/L (ref 15–41)
Albumin: 4.1 g/dL (ref 3.5–5.0)
Alkaline Phosphatase: 51 U/L (ref 38–126)
Anion gap: 7 (ref 5–15)
BUN: <5 mg/dL — ABNORMAL LOW (ref 6–20)
CO2: 25 mmol/L (ref 22–32)
Calcium: 10.3 mg/dL (ref 8.9–10.3)
Chloride: 105 mmol/L (ref 98–111)
Creatinine, Ser: 0.65 mg/dL (ref 0.44–1.00)
GFR, Estimated: >60 mL/min (ref >60)
Glucose, Bld: 90 mg/dL (ref 70–99)
Potassium: 4 mmol/L (ref 3.5–5.1)
Sodium: 137 mmol/L (ref 135–145)
Total Bilirubin: 0.6 mg/dL (ref 0.3–1.2)
Total Protein: 7.7 g/dL (ref 6.5–8.1)

## 2022-11-10 LAB — URINALYSIS, ROUTINE W REFLEX MICROSCOPIC
Bilirubin Urine: NEGATIVE
Glucose, UA: NEGATIVE mg/dL
Hgb urine dipstick: NEGATIVE
Ketones, ur: NEGATIVE mg/dL
Leukocytes,Ua: NEGATIVE
Nitrite: NEGATIVE
Protein, ur: NEGATIVE mg/dL
Specific Gravity, Urine: 1.023 (ref 1.005–1.030)
pH: 5 (ref 5.0–8.0)

## 2022-11-10 LAB — CBC WITH DIFFERENTIAL/PLATELET
Abs Immature Granulocytes: 0.01 10*3/uL (ref 0.00–0.07)
Basophils Absolute: 0 10*3/uL (ref 0.0–0.1)
Basophils Relative: 0 %
Eosinophils Absolute: 0.3 10*3/uL (ref 0.0–0.5)
Eosinophils Relative: 3 %
HCT: 39.8 % (ref 36.0–46.0)
Hemoglobin: 13 g/dL (ref 12.0–15.0)
Immature Granulocytes: 0 %
Lymphocytes Relative: 41 %
Lymphs Abs: 3.3 10*3/uL (ref 0.7–4.0)
MCH: 31.8 pg (ref 26.0–34.0)
MCHC: 32.7 g/dL (ref 30.0–36.0)
MCV: 97.3 fL (ref 80.0–100.0)
Monocytes Absolute: 0.5 10*3/uL (ref 0.1–1.0)
Monocytes Relative: 6 %
Neutro Abs: 4 10*3/uL (ref 1.7–7.7)
Neutrophils Relative %: 50 %
Platelets: 277 10*3/uL (ref 150–400)
RBC: 4.09 MIL/uL (ref 3.87–5.11)
RDW: 13.5 % (ref 11.5–15.5)
WBC: 8.2 10*3/uL (ref 4.0–10.5)
nRBC: 0 % (ref 0.0–0.2)

## 2022-11-10 LAB — I-STAT CHEM 8, ED
BUN: <3 mg/dL — ABNORMAL LOW (ref 6–20)
Calcium, Ion: 1.35 mmol/L (ref 1.15–1.40)
Chloride: 105 mmol/L (ref 98–111)
Creatinine, Ser: 0.7 mg/dL (ref 0.44–1.00)
Glucose, Bld: 86 mg/dL (ref 70–99)
HCT: 41 % (ref 36.0–46.0)
Hemoglobin: 13.9 g/dL (ref 12.0–15.0)
Potassium: 4.2 mmol/L (ref 3.5–5.1)
Sodium: 140 mmol/L (ref 135–145)
TCO2: 29 mmol/L (ref 22–32)

## 2022-11-10 LAB — LIPASE, BLOOD: Lipase: 26 U/L (ref 11–51)

## 2022-11-10 MED ORDER — SUCRALFATE 1 G PO TABS
1.0000 g | ORAL_TABLET | Freq: Once | ORAL | Status: AC
  Administered 2022-11-10: 1 g via ORAL
  Filled 2022-11-10: qty 1

## 2022-11-10 MED ORDER — DICYCLOMINE HCL 20 MG PO TABS: 20.0000 mg | ORAL_TABLET | Freq: Two times a day (BID) | ORAL | 0 refills | Status: DC | PRN

## 2022-11-10 MED ORDER — IOHEXOL 300 MG/ML  SOLN
100.0000 mL | Freq: Once | INTRAMUSCULAR | Status: AC | PRN
  Administered 2022-11-10: 100 mL via INTRAVENOUS

## 2022-11-10 MED ORDER — ALUM & MAG HYDROXIDE-SIMETH 200-200-20 MG/5ML PO SUSP
30.0000 mL | Freq: Once | ORAL | Status: AC
  Administered 2022-11-10: 30 mL via ORAL
  Filled 2022-11-10: qty 30

## 2022-11-10 MED ORDER — SUCRALFATE 1 G PO TABS: 1.0000 g | ORAL_TABLET | Freq: Three times a day (TID) | ORAL | 0 refills | Status: DC

## 2022-11-10 MED ORDER — FAMOTIDINE 20 MG PO TABS
10.0000 mg | ORAL_TABLET | Freq: Once | ORAL | Status: AC
  Administered 2022-11-10: 10 mg via ORAL
  Filled 2022-11-10: qty 1

## 2022-11-10 NOTE — ED Provider Notes (Signed)
[place] EMERGENCY DEPARTMENT AT [facility] Provider Note   CSN: [id number] Arrival date & time: 11/10/22  1837     History  Chief Complaint  Patient presents with   Abdominal Pain    Jenna Henry is a 27 y.o. female with medical history anxiety, asthma, depression, herpes, PCOS.  Patient presents to ED for evaluation abdominal pain, "lump in my butt".  Patient reports that last week she was seen 2 times at [facility] as well as here at [facility].  Patient reports that her initial visit at [facility] was unsatisfactory and she did not enjoy the experience.  She then came to [facility] the next day where she was diagnosed with gastritis and received GI cocktail as well as famotidine prescription.  She states that on Friday when she was given GI cocktail and famotidine, her abdominal pain did decrease.  She states that after she left that night, her abdominal pain returned overnight and it has been persistent since Friday.  She states that she has been taking famotidine but the pain in her stomach continues to gradually increase throughout the day.  She denies any nausea or vomiting at home.  She denies any fevers, body aches or chills.  She does go on to state that sometimes she feels as if she needs to urinate but she is unable to do so.  She is also reporting a "lump" on her rectum that she is concerned is a hemorrhoid.  She states that whenever he has a bowel movement it is very painful and she will sometimes wipe and there is blood on the toilet paper.  She denies a history of hemorrhoids.  She denies lightheadedness, dizziness, weakness.   Abdominal Pain Associated symptoms: no diarrhea, no fever, no nausea and no vomiting        Home Medications Prior to Admission medications   Medication Sig Start Date End Date Taking? Authorizing Provider  dicyclomine (BENTYL) 20 MG tablet Take 1 tablet (20 mg total) by mouth 2 (two) times daily as needed  for spasms. 11/10/22  Yes Al Decant, PA-C  sucralfate (CARAFATE) 1 g tablet Take 1 tablet (1 g total) by mouth 4 (four) times daily -  with meals and at bedtime. 11/10/22  Yes Al Decant, PA-C  albuterol (VENTOLIN HFA) 108 (90 Base) MCG/ACT inhaler Inhale 2 puffs into the lungs every 4 (four) hours as needed for wheezing or shortness of breath. (Must have office visit for refills) 06/23/22   [name], MD  doxycycline (VIBRA-TABS) 100 MG tablet Take 1 tablet (100 mg total) by mouth daily. 09/22/22   [name], MD  famotidine (PEPCID) 20 MG tablet Take 1 tablet (20 mg total) by mouth 2 (two) times daily. 11/06/22   Gwyneth Sprout, MD  metFORMIN (GLUCOPHAGE) 500 MG tablet Take 1 tablet (500 mg total) by mouth 2 (two) times daily with a meal. 06/23/22   [name], MD  valACYclovir (VALTREX) 1000 MG tablet Take 1 tablet (1,000 mg total) by mouth 2 (two) times daily. 08/31/22   [name], MD      Allergies    Ortho tri-cyclen [norgestimate-eth estradiol]    Review of Systems   Review of Systems  Constitutional:  Negative for fever.  Gastrointestinal:  Positive for abdominal pain. Negative for diarrhea, nausea and vomiting.  All other systems reviewed and are negative.   Physical Exam Updated Vital Signs BP 115/66   Pulse 65  Temp 97.9 F (36.6 C) (Oral)   Resp 18   Ht 5\' 5"  (1.651 m)   Wt 83 kg   LMP 10/26/2022   SpO2 99%   BMI 30.45 kg/m  Physical Exam Vitals and nursing note reviewed.  Constitutional:      General: She is not in acute distress.    Appearance: Normal appearance. She is not ill-appearing, toxic-appearing or diaphoretic.  HENT:     Head: Normocephalic and atraumatic.     Nose: Nose normal.     Mouth/Throat:     Mouth: Mucous membranes are moist.     Pharynx: Oropharynx is clear.  Eyes:     Extraocular Movements: Extraocular movements intact.     Conjunctiva/sclera: Conjunctivae normal.     Pupils: Pupils are  equal, round, and reactive to light.  Cardiovascular:     Rate and Rhythm: Normal rate and regular rhythm.  Pulmonary:     Effort: Pulmonary effort is normal.     Breath sounds: Normal breath sounds. No wheezing.  Abdominal:     General: Abdomen is flat. Bowel sounds are normal.     Palpations: Abdomen is soft.     Tenderness: There is no abdominal tenderness.     Comments: All 4 quadrants of abdomen soft and compressible  Musculoskeletal:     Cervical back: Normal range of motion and neck supple. No tenderness.  Skin:    General: Skin is warm and dry.     Capillary Refill: Capillary refill takes less than 2 seconds.  Neurological:     Mental Status: She is alert and oriented to person, place, and time.     ED Results / Procedures / Treatments   Labs (all labs ordered are listed, but only abnormal results are displayed) Labs Reviewed  COMPREHENSIVE METABOLIC PANEL - Abnormal; Notable for the following components:      Result Value   BUN <5 (*)    All other components within normal limits  I-STAT CHEM 8, ED - Abnormal; Notable for the following components:   BUN <3 (*)    All other components within normal limits  CBC WITH DIFFERENTIAL/PLATELET  URINALYSIS, ROUTINE W REFLEX MICROSCOPIC  LIPASE, BLOOD  I-STAT BETA HCG BLOOD, ED ([facility], [facility], [facility] ONLY)    EKG None  Radiology CT ABDOMEN PELVIS W CONTRAST  Result Date: 11/10/2022 CLINICAL DATA:  Abdominal pain, acute, nonlocalized EXAM: CT ABDOMEN AND PELVIS WITH CONTRAST TECHNIQUE: Multidetector CT imaging of the abdomen and pelvis was performed using the standard protocol following bolus administration of intravenous contrast. RADIATION DOSE REDUCTION: This exam was performed according to the departmental dose-optimization program which includes automated exposure control, adjustment of the mA and/or kV according to patient size and/or use of iterative reconstruction technique. CONTRAST:  OMNIPAQUE IOHEXOL 300 MG/ML  SOLN  COMPARISON:  None Available. FINDINGS: Lower chest: No acute abnormality. Hepatobiliary: Mild hepatic steatosis. No enhancing intrahepatic mass. No intra or extrahepatic biliary ductal dilation. Gallbladder unremarkable. Pancreas: Unremarkable Spleen: Unremarkable Adrenals/Urinary Tract: Adrenal glands are unremarkable. Kidneys are normal, without renal calculi, focal lesion, or hydronephrosis. Bladder is unremarkable. Stomach/Bowel: Stomach is within normal limits. Appendix appears normal. No evidence of bowel wall thickening, distention, or inflammatory changes. Vascular/Lymphatic: No significant vascular findings are present. No enlarged abdominal or pelvic lymph nodes. Reproductive: Uterus and bilateral adnexa are unremarkable. Other: No abdominal wall hernia Musculoskeletal: No acute bone abnormality. No lytic or blastic bone lesion. IMPRESSION: 1. No acute intra-abdominal pathology identified. No definite radiographic explanation  for the patient's reported symptoms. 2. Mild hepatic steatosis. Electronically Signed   By: Helyn Numbers M.D.   On: 11/10/2022 22:26    Procedures Procedures   Medications Ordered in ED Medications  sucralfate (CARAFATE) tablet 1 g (1 g Oral Given 11/10/22 2042)  alum & mag hydroxide-simeth (MAALOX/MYLANTA) 200-200-20 MG/5ML suspension 30 mL (30 mLs Oral Given 11/10/22 2043)  famotidine (PEPCID) tablet 10 mg (10 mg Oral Given 11/10/22 2043)  iohexol (OMNIPAQUE) 300 MG/ML solution 100 mL (100 mLs Intravenous Contrast Given 11/10/22 2157)    ED Course/ Medical Decision Making/ A&P  Medical Decision Making Amount and/or Complexity of Data Reviewed Labs: ordered. Radiology: ordered.  Risk OTC drugs. Prescription drug management.   27 year old female presents to ED for evaluation abdominal pain.  Please see HPI for further details.  On examination patient is afebrile and nontachycardic.  Abdomen soft and compressible throughout without tenderness elicited.  Overall  nontoxic in appearance.  No CVA tenderness bilaterally.  Patient CBC shows no leukocytosis or anemia.  Patient CMP shows no electrolyte derangement, no elevated creatinine, no anion gap.  Urinalysis is all negative.  Patient given GI cocktail, famotidine, Carafate.  On reassessment she reports that her abdominal pain has decreased slightly.  Will send patient with Carafate, Bentyl and have her continue taking famotidine.  Will have the patient referred to GI for further management.  She has voiced understanding, instructions.  She had all of her questions answered to her satisfaction.  She is stable to discharge home at this time.   Final Clinical Impression(s) / ED Diagnoses Final diagnoses:  Epigastric pain    Rx / DC Orders ED Discharge Orders          Ordered    sucralfate (CARAFATE) 1 g tablet  3 times daily with meals & bedtime        11/10/22 2241    dicyclomine (BENTYL) 20 MG tablet  2 times daily PRN        11/10/22 2241              Al Decant, PA-C 11/10/22 2242    Virgina Norfolk, DO 11/10/22 2318

## 2022-11-10 NOTE — ED Triage Notes (Signed)
Patient is here for evaluation of abdominal pain. Reports being diagnosed with gastritis on Friday. States that she is experiencing more pain than anything, denies nausea. Also reports falling in the rain and landing on the right side, so her whole right side is also hurting.

## 2022-11-10 NOTE — Discharge Instructions (Addendum)
Return to the ED with any new or worsening symptoms Please follow-up with your PCP for further management Please call gastroenterology office, [facility], and make an appointment to be seen utilizing number on this form Please begin taking Carafate as directed.  You may also continue taking famotidine which is a medication prescribed you last time you were here.  I will also prescribe Bentyl which you can take 2 times daily as needed for abdominal cramping.

## 2022-11-10 NOTE — ED Notes (Signed)
Per EAVWU@ [facility]- recollect needed for light green tube. [employer]

## 2022-11-11 ENCOUNTER — Encounter: Payer: Self-pay | Admitting: Physician Assistant

## 2022-11-16 NOTE — Progress Notes (Signed)
11/17/2022 Jenna Henry [id number] 20-Dec-1995  Referring provider: [name], MD Primary GI doctor: Dr. Barron Alvine  ASSESSMENT AND PLAN:   27 year old female with history of nausea, abdominal pain, change in bowel habits for years but becoming worse over the last several months. 11/10/2022 CT abdomen pelvis with contrast unremarkable other than mild hepatic steatosis Patient does smoke marijuana  With longstanding GI history, possible rectal bleeding/tenesmus/abdominal pain/nausea we will plan on EGD and colonoscopy with Dr. Barron Alvine at the [facility] Added on Protonix 40 mg once daily Given information about IBS Given information about cannabinoid hyperemesis syndrome, discussed marijuana cessation Will check thyroid, sed rate, CRP  Fatty liver seen on CT -Normal LFTs --Continue to work on risk factor modification including diet exercise and control of risk factors including blood sugars. - monitor q 6 months.    Patient Care Team: [name], MD as PCP - General (Family Medicine)  HISTORY OF PRESENT ILLNESS: 27 y.o. female with a past medical history of PCOS, herpes, hidradenitis and others listed below presents for evaluation of AB pain, constipation and nausea.   In the month of June patient's had 3 visits to the ER for nausea, vomiting and abdominal pain. 11/08/2020 CT abdomen pelvis without contrast  lower abdominal pain which showed small volume free fluid within the cul-de-sac likely physiological, no acute abdomen pelvic issues 11/06/2022 RUQ [place] no gallstones, no ductal dilation.  11/07/2022 had a fall on left side of body.  11/10/2022 patient went to the ER  Hgb 13, MCV 97, normal WBC and platelets, negative platelets, normal kidney and liver, normal urine. CT abdomen pelvis with contrast for acute abdominal pain showed no intra-abdominal pathology, mild hepatic steatosis normal gallbladder, normal pancreas, normal spleen, stomach unremarkable, no evidence  of bowel wall thickening distention or inflammatory changes Patient given GI cocktail, famotidine and Carafate with some improvement of her symptoms.  She states she has had stomach issues since at least 2017.  When she was younger was on miralax and improved with getting older, than last year of high school started to have GI issues.  She states she has AB pain with her entire AB, recently radiating to lower back. Associated with nausea but no vomiting.  She has GERD with spicy foods, no dysphagia. One episode of dark/black stool, no pepto or iron.  In the last month or two she has had feeling of needing to have a BM but unable to have BM.  She was having BM daily, but then was 2-3 x a day for a while, small volume but felt she needed to have more Bm's, then now last BM was Sunday.  She has seen red/orange stool with BM for 2-3 days, has had mucus.  She has fatigue/weakness, but no fever, chills, SOB, CP.  Denies weight loss.  Carafate helped the most, hot showers or warm baths did not help.  Patient denies family history of colon cancer or other gastrointestinal malignancies. She denies blood thinner use.  She denies NSAID use.  She denies ETOH use, rare with holidays.  She reports tobacco use, vaping.  She reports drug use, marijuana daily or every other day for 8 years. .    She  reports that she has quit smoking. Her smoking use included cigarettes. She smoked an average of .5 packs per day. She uses smokeless tobacco. She reports current alcohol use of about 2.0 standard drinks of alcohol per week. She reports current drug use. Drug: Marijuana.  RELEVANT LABS  AND IMAGING: CBC    Component Value Date/Time   WBC 8.2 11/10/2022 2012   RBC 4.09 11/10/2022 2012   HGB 13.9 11/10/2022 2119   HGB 15.2 02/22/2019 1429   HCT 41.0 11/10/2022 2119   HCT 45.1 02/22/2019 1429   PLT 277 11/10/2022 2012   PLT 260 02/22/2019 1429   MCV 97.3 11/10/2022 2012   MCV 93 02/22/2019 1429   MCH  31.8 11/10/2022 2012   MCHC 32.7 11/10/2022 2012   RDW 13.5 11/10/2022 2012   RDW 13.4 02/22/2019 1429   LYMPHSABS 3.3 11/10/2022 2012   MONOABS 0.5 11/10/2022 2012   EOSABS 0.3 11/10/2022 2012   BASOSABS 0.0 11/10/2022 2012   Recent Labs    06/25/22 1503 11/05/22 1212 11/10/22 2012 11/10/22 2119  HGB 13.9 13.6 13.0 13.9    CMP     Component Value Date/Time   NA 140 11/10/2022 2119   NA 141 09/24/2017 1023   K 4.2 11/10/2022 2119   CL 105 11/10/2022 2119   CO2 25 11/10/2022 2105   GLUCOSE 86 11/10/2022 2119   BUN <3 (L) 11/10/2022 2119   BUN 8 09/24/2017 1023   CREATININE 0.70 11/10/2022 2119   CALCIUM 10.3 11/10/2022 2105   PROT 7.7 11/10/2022 2105   PROT 7.2 09/24/2017 1023   ALBUMIN 4.1 11/10/2022 2105   ALBUMIN 4.4 09/24/2017 1023   AST 23 11/10/2022 2105   ALT 21 11/10/2022 2105   ALKPHOS 51 11/10/2022 2105   BILITOT 0.6 11/10/2022 2105   BILITOT 0.4 09/24/2017 1023   GFRNONAA >60 11/10/2022 2105   GFRAA >60 12/14/2019 1639      Latest Ref Rng & Units 11/10/2022    9:05 PM 11/05/2022   12:12 PM 06/25/2022    3:03 PM  Hepatic Function  Total Protein 6.5 - 8.1 g/dL 7.7  7.5  7.6   Albumin 3.5 - 5.0 g/dL 4.1  4.5  4.3   AST 15 - 41 U/L 23  15  19    ALT 0 - 44 U/L 21  17  20    Alk Phosphatase 38 - 126 U/L 51  54  56   Total Bilirubin 0.3 - 1.2 mg/dL 0.6  0.4  0.6       Current Medications:   Current Outpatient Medications (Endocrine & Metabolic):    metFORMIN (GLUCOPHAGE) 500 MG tablet, Take 1 tablet (500 mg total) by mouth 2 (two) times daily with a meal.   Current Outpatient Medications (Respiratory):    albuterol (VENTOLIN HFA) 108 (90 Base) MCG/ACT inhaler, Inhale 2 puffs into the lungs every 4 (four) hours as needed for wheezing or shortness of breath. (Must have office visit for refills)    Current Outpatient Medications (Other):    dicyclomine (BENTYL) 20 MG tablet, Take 1 tablet (20 mg total) by mouth 2 (two) times daily as needed for  spasms.   doxycycline (VIBRA-TABS) 100 MG tablet, Take 1 tablet (100 mg total) by mouth daily.   famotidine (PEPCID) 20 MG tablet, Take 1 tablet (20 mg total) by mouth 2 (two) times daily.   pantoprazole (PROTONIX) 40 MG tablet, Take 1 tablet (40 mg total) by mouth daily.   sucralfate (CARAFATE) 1 g tablet, Take 1 tablet (1 g total) by mouth 4 (four) times daily -  with meals and at bedtime.   valACYclovir (VALTREX) 1000 MG tablet, Take 1 tablet (1,000 mg total) by mouth 2 (two) times daily.  Medical History:  Past Medical History:  Diagnosis Date  Anxiety    Asthma    Depression    Herpes    PCOS (polycystic ovarian syndrome)    Polycystic ovaries    Allergies:  Allergies  Allergen Reactions   Ortho Tri-Cyclen [Norgestimate-Eth Estradiol] Hives, Itching, Rash and Other (See Comments)    "Caused burning all over"     Surgical History:  She  has a past surgical history that includes Cystectomy and Ankle reconstruction (Right, 10/28/2021). Family History:  Her family history includes Anxiety disorder in her mother; Arthritis in her mother; COPD in her maternal grandfather; Depression in her mother; Diabetes in her maternal grandfather; Heart attack in her maternal grandfather; Heart disease in her maternal grandfather; Heart murmur in her father; Hyperlipidemia in her maternal grandmother; Hypertension in her maternal grandfather and mother.  REVIEW OF SYSTEMS  : All other systems reviewed and negative except where noted in the History of Present Illness.  PHYSICAL EXAM: BP 122/78   Pulse 71   Ht 5\' 5"  (1.651 m)   Wt 186 lb (84.4 kg)   LMP 10/26/2022   BMI 30.95 kg/m  General Appearance: Well nourished, in no apparent distress. Head:   Normocephalic and atraumatic. Eyes:  sclerae anicteric,conjunctive pink  Respiratory: Respiratory effort normal, BS equal bilaterally without rales, rhonchi, wheezing. Cardio: RRR with no MRGs. Peripheral pulses intact.  Abdomen: Soft,  Obese  ,active bowel sounds. mild tenderness in the epigastrium and in the lower abdomen. Without guarding and Without rebound. No masses. Rectal: Not evaluated Musculoskeletal: Full ROM, Normal gait. Without edema. Skin:  Dry and intact without significant lesions or rashes Neuro: Alert and  oriented x4;  No focal deficits. Psych:  Cooperative. Normal mood and affect.    Doree Albee, PA-C 10:51 AM

## 2022-11-17 ENCOUNTER — Ambulatory Visit (INDEPENDENT_AMBULATORY_CARE_PROVIDER_SITE_OTHER): Payer: Medicaid Other | Admitting: Physician Assistant

## 2022-11-17 ENCOUNTER — Encounter: Payer: Self-pay | Admitting: Physician Assistant

## 2022-11-17 VITALS — BP 122/78 | HR 71 | Ht 65.0 in | Wt 186.0 lb

## 2022-11-17 DIAGNOSIS — R110 Nausea: Secondary | ICD-10-CM

## 2022-11-17 DIAGNOSIS — K625 Hemorrhage of anus and rectum: Secondary | ICD-10-CM

## 2022-11-17 DIAGNOSIS — R194 Change in bowel habit: Secondary | ICD-10-CM

## 2022-11-17 DIAGNOSIS — R1084 Generalized abdominal pain: Secondary | ICD-10-CM | POA: Diagnosis not present

## 2022-11-17 DIAGNOSIS — K760 Fatty (change of) liver, not elsewhere classified: Secondary | ICD-10-CM

## 2022-11-17 DIAGNOSIS — F1290 Cannabis use, unspecified, uncomplicated: Secondary | ICD-10-CM

## 2022-11-17 MED ORDER — PANTOPRAZOLE SODIUM 40 MG PO TBEC
40.0000 mg | DELAYED_RELEASE_TABLET | Freq: Every day | ORAL | 3 refills | Status: DC
Start: 2022-11-17 — End: 2023-04-21

## 2022-11-17 MED ORDER — NA SULFATE-K SULFATE-MG SULF 17.5-3.13-1.6 GM/177ML PO SOLN: 1.0000 | Freq: Once | ORAL | 0 refills | Status: AC

## 2022-11-17 NOTE — Patient Instructions (Addendum)
_______________________________________________________  If your blood pressure at your visit was 140/90 or greater, please contact your primary care physician to follow up on this.  _______________________________________________________  If you are age 25 or older, your body mass index should be between 23-30. Your Body mass index is 30.95 kg/m. If this is out of the aforementioned range listed, please consider follow up with your Primary Care Provider.  If you are age 66 or younger, your body mass index should be between 19-25. Your Body mass index is 30.95 kg/m. If this is out of the aformentioned range listed, please consider follow up with your Primary Care Provider.   ________________________________________________________  The [facility] providers would like to encourage you to use [facility] to communicate with providers for non-urgent requests or questions.  Due to long hold times on the telephone, sending your provider a message by [facility] may be a faster and more efficient way to get a response.  Please allow 48 business hours for a response.  Please remember that this is for non-urgent requests.  _______________________________________________________ Bonita Quin have been scheduled for a colonoscopy. Please follow written instructions given to you at your visit today.  Please pick up your prep supplies at the pharmacy within the next 1-3 days. If you use inhalers (even only as needed), please bring them with you on the day of your procedure.   Please take your proton pump inhibitor medication, protonix 40 mg once a day  Please take this medication 30 minutes to 1 hour before meals- this makes it more effective.  Avoid spicy and acidic foods Avoid fatty foods Limit your intake of coffee, tea, alcohol, and carbonated drinks Work to maintain a healthy weight Keep the head of the bed elevated at least 3 inches with blocks or a wedge pillow if you are having any nighttime symptoms Stay  upright for 2 hours after eating Avoid meals and snacks three to four hours before bedtime  First do a trial off milk/lactose products if you use them.  Add fiber like benefiber or citracel once a day Increase activity Can do trial of IBGard which is over the counter for AB pain- Take 1-2 capsules once a day for maintence or twice a day during a flare  Please try low FODMAP diet- see below- start with eliminating just one column at a time, the table at the very bottom contains foods that are safe to take   FODMAP stands for fermentable oligo-, di-, mono-saccharides and polyols (1). These are the scientific terms used to classify groups of carbs that are notorious for triggering digestive symptoms like bloating, gas and stomach pain.     ___________________________  Cannabinoid Hyperemesis Syndrome  What is cannabinoid hyperemesis syndrome?  Cannabinoid hyperemesis syndrome (CHS) is a condition that leads to repeated and severe bouts of vomiting. It is rare and only occurs in daily long-term users of marijuana.  Marijuana has several active substances. These include THC and related chemicals. These substances bind to molecules found in the brain. That causes the drug "high" and other effects that users feel.  Your digestive tract also has a number of molecules that bind to [facility] and related substances. So marijuana also affects the digestive tract. For example, the drug can change the time it takes the stomach to empty. It also affects the esophageal sphincter. That's the tight band of muscle that opens and closes to let food from the esophagus into the stomach. Long-term marijuana use can change the way the affected molecules respond  and lead to the symptoms of CHS.  Marijuana is the most widely used recreational drug in the [place] Young adults are the most frequent users. A small number of these people develop CHS. It often only happens in people who have regularly used marijuana. Often CHS  affects those who use the drug at least once a day.  What causes cannabinoid hyperemesis syndrome?  Marijuana has very complex effects on the body. Experts are still trying to learn exactly how it causes CHS in some people.  In the brain, marijuana often has the opposite effect of CHS. It helps prevent nausea and vomiting. The drug is also good at stopping such symptoms in people having chemotherapy.  But in the digestive tract, marijuana seems to have the opposite effect. It actually makes you more likely to have nausea and vomiting. With the first use of marijuana, the signals from the brain may be more important. That may lead to anti-nausea effects at first. But with repeated use of marijuana, certain receptors in the brain may stop responding to the drug in the same way. That may cause the repeated bouts of vomiting found in people with CHS.  It still isn't clear why some heavy marijuana users get the syndrome, but others don't.  What are the symptoms of cannabinoid hyperemesis syndrome?  People with CHS suffer from repeated bouts of vomiting. In between these episodes are times without any symptoms. Healthcare providers often divide these symptoms into 3 stages: the prodromal phase, the hyperemetic phase, and the recovery phase.  Prodromal phase. During this phase, the main symptoms are often early morning nausea and belly (abdominal) pain. Some people also develop a fear of vomiting. Most people keep normal eating patterns during this time. Some people use more marijuana because they think it will help stop the nausea. This phase may last for months or years.  Hyperemetic phase. Symptoms during this time may include:  Ongoing nausea  Repeated episodes of vomiting  Belly pain  Decreased food intake and weight loss  Symptoms of fluid loss (dehydration)  During this phase, vomiting is often intense and overwhelming. Many people take a lot of hot showers during the day. They find  that doing so eases their nausea. (That may be because of how the hot temperature affects a part of the brain called the hypothalamus. This part of the brain effects both temperature regulation and vomiting.) People often first seek medical care during this phase.  The hyperemetic phase may continue until the person completely stops using marijuana. Then the recovery phase starts.  Recovery phase. During this time, symptoms go away. Normal eating is possible again. This phase can last days or months. Symptoms often come back if the person tries marijuana again.  How is cannabinoid hyperemesis syndrome diagnosed?  Many health problems can cause repeated vomiting. To make a diagnosis, your healthcare provider will ask you about your symptoms and your past health. He or she will also do a physical exam, including an exam of your belly. Often this diagnosis can be made from history alone, however.  Your healthcare provider may also need more tests to rule out other causes of the vomiting.  CHS was only recently discovered. So some healthcare providers may not know about it. As a result, they may not spot it for many years. They often confuse CHS with cyclical vomiting disorder. That is a health problem that causes similar symptoms. A specialist trained in diseases of the digestive tract (gastroenterologist) might make the  diagnosis.  You may have CHS if you have all of these:  Long-term weekly and daily marijuana use  Belly pain  Severe, repeated nausea and vomiting  You feel better after taking a hot shower  There is no single test that confirms this diagnosis. Only improvement after quitting marijuana confirms the diagnosis.  How is cannabinoid hyperemesis syndrome treated?  To fully get better, you need to stop using marijuana all together. If you stop using marijuana, your symptoms should not come back.  Symptoms often ease after a day or 2 unless marijuana is used before this  time.  In a small sample of people with CHS, rubbing capsaicin cream on the belly helped decrease pain and nausea. The chemicals in the cream have the same effect as a hot shower.  Quitting marijuana may lead to other health benefits, such as:  Better lung function  Improved memory and thinking skills  Better sleep  Key points about cannabinoid hyperemesis syndrome  CHS is a condition that leads to repeated and severe bouts of vomiting. It results from long-term use of marijuana.  Most people self-treat using hot showers to help reduce their symptoms.  Some people with CHS may not be diagnosed for several years. Admitting to your healthcare provider that you use marijuana daily can speed up the diagnosis.  Symptoms start to go away within a day or 2 after stopping marijuana use.  Symptoms can come back if you use marijuana again  _______________________________________________________  If your blood pressure at your visit was 140/90 or greater, please contact your primary care physician to follow up on this.  _______________________________________________________  If you are age 71 or older, your body mass index should be between 23-30. Your Body mass index is 30.95 kg/m. If this is out of the aforementioned range listed, please consider follow up with your Primary Care Provider.  If you are age 33 or younger, your body mass index should be between 19-25. Your Body mass index is 30.95 kg/m. If this is out of the aformentioned range listed, please consider follow up with your Primary Care Provider.   ________________________________________________________  The [facility] providers would like to encourage you to use [facility] to communicate with providers for non-urgent requests or questions.  Due to long hold times on the telephone, sending your provider a message by [facility] may be a faster and more efficient way to get a response.  Please allow 48 business hours for a response.   Please remember that this is for non-urgent requests.  _______________________________________________________ It was a pleasure to see you today!  Thank you for trusting me with your gastrointestinal care!

## 2022-11-19 ENCOUNTER — Encounter: Payer: Self-pay | Admitting: Family Medicine

## 2022-11-19 ENCOUNTER — Ambulatory Visit (INDEPENDENT_AMBULATORY_CARE_PROVIDER_SITE_OTHER): Payer: Medicaid Other | Admitting: Family Medicine

## 2022-11-19 VITALS — BP 98/60 | HR 87 | Temp 98.6°F | Ht 65.0 in | Wt 184.7 lb

## 2022-11-19 DIAGNOSIS — R110 Nausea: Secondary | ICD-10-CM | POA: Diagnosis not present

## 2022-11-19 MED ORDER — ONDANSETRON HCL 4 MG PO TABS: 4.0000 mg | ORAL_TABLET | Freq: Three times a day (TID) | ORAL | 0 refills | Status: DC | PRN

## 2022-11-19 NOTE — Patient Instructions (Signed)
Add a probiotic - take them once or twice a day to replenish the healthy bacteria in your gut

## 2022-11-19 NOTE — Progress Notes (Signed)
Established Patient Office Visit  Subjective   Patient ID: Jenna Henry, female    DOB: 03/06/1996  Age: 27 y.o. MRN: [id number]  Chief Complaint  Patient presents with   Abdominal Pain    States she went to the ER multiple times for abdominal pain and gastritis. States that she had stopped all of her medication to see if that was the medication which was not. States she never started the metformin so it wasn't this medication that caused the pain.   States that the GI specialist will be performed a colonoscopy on her later this summer. Is taking the 40 mg pantoprazole once daily and and she reports this is helping her a lot with her symptoms. She denies any vomiting currently, no diarrheao r constipation. I reviewed her CT abd/pelvis and the ED notes.    Current Outpatient Medications  Medication Instructions   albuterol (VENTOLIN HFA) 108 (90 Base) MCG/ACT inhaler Inhale 2 puffs into the lungs every 4 (four) hours as needed for wheezing or shortness of breath. (Must have office visit for refills)   dicyclomine (BENTYL) 20 mg, Oral, 2 times daily PRN   doxycycline (VIBRA-TABS) 100 mg, Oral, Daily   famotidine (PEPCID) 20 mg, Oral, 2 times daily   metFORMIN (GLUCOPHAGE) 500 mg, Oral, 2 times daily with meals   ondansetron (ZOFRAN) 4 mg, Oral, Every 8 hours PRN   pantoprazole (PROTONIX) 40 mg, Oral, Daily   sucralfate (CARAFATE) 1 g, Oral, 3 times daily with meals & bedtime   valACYclovir (VALTREX) 1,000 mg, Oral, 2 times daily    Patient Active Problem List   Diagnosis Date Noted   Fatty liver 11/17/2022   Hydradenitis 06/23/2022   PCOS (polycystic ovarian syndrome) 06/23/2022   Mild intermittent asthma 06/23/2022   Sprain of anterior talofibular ligament of right ankle    Syndesmotic disruption of right ankle    Head trauma, sequela 06/03/2021   New daily persistent headache 06/03/2021   Dizziness and giddiness 06/03/2021   MDD (major depressive disorder) 10/10/2018    Hypokalemia 10/09/2018   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     Objective:     BP 98/60 (BP Location: Left Arm, Patient Position: Sitting, Cuff Size: Normal)   Pulse 87   Temp 98.6 F (37 C) (Oral)   Ht 5\' 5"  (1.651 m)   Wt 184 lb 11.2 oz (83.8 kg)   LMP 10/26/2022   SpO2 98%   BMI 30.74 kg/m    Physical Exam Vitals reviewed.  Constitutional:      Appearance: Normal appearance. She is well-groomed and normal weight.  Eyes:     Conjunctiva/sclera: Conjunctivae normal.  Neck:     Thyroid: No thyromegaly.  Cardiovascular:     Rate and Rhythm: Regular rhythm.     Pulses: Normal pulses.     Heart sounds: S1 normal and S2 normal.  Pulmonary:     Effort: Pulmonary effort is normal.     Breath sounds: Normal breath sounds and air entry.  Abdominal:     General: Bowel sounds are normal.     Tenderness: There is no abdominal tenderness. There is no guarding or rebound.  Musculoskeletal:     Right lower leg: No edema.  Left lower leg: No edema.  Neurological:     Mental Status: She is alert and oriented to person, place, and time. Mental status is at baseline.     Gait: Gait is intact.  Psychiatric:        Mood and Affect: Mood and affect normal.        Speech: Speech normal.        Behavior: Behavior normal.        Judgment: Judgment normal.      No results found for any visits on 11/19/22.    The ASCVD Risk score (Arnett DK, et al., 2019) failed to calculate for the following reasons:   The 2019 ASCVD risk score is only valid for ages 55 to 66    Assessment & Plan:  Nausea -     Ondansetron HCl; Take 1 tablet (4 mg total) by mouth every 8 (eight) hours as needed for nausea or vomiting.   Dispense: 20 tablet; Refill: 0   Most likely secondary to severe gastritis. She notes good improvement on the PPI, she is seeing the GI physician for scopes later in July. Will give PRN zofran for occasional nausea sheis experiencing.   Return in about 5 months (around 04/19/2023) for follow up.    [name], MD

## 2022-12-02 ENCOUNTER — Telehealth: Payer: Self-pay | Admitting: Physician Assistant

## 2022-12-02 NOTE — Telephone Encounter (Signed)
Instructions sent to pt via mychart and she is aware.

## 2022-12-02 NOTE — Telephone Encounter (Signed)
Inbound call from patient requesting new instruction for procedure on 7/10.Please advise.

## 2022-12-04 NOTE — Progress Notes (Signed)
Agree with the assessment and plan as outlined by Amanda Collier, PA-C. ? ?Josedejesus Marcum, DO, FACG ? ?

## 2022-12-09 ENCOUNTER — Encounter: Payer: Self-pay | Admitting: Internal Medicine

## 2022-12-09 ENCOUNTER — Ambulatory Visit (AMBULATORY_SURGERY_CENTER): Payer: Medicaid Other | Admitting: Internal Medicine

## 2022-12-09 ENCOUNTER — Encounter: Payer: Medicaid Other | Admitting: Gastroenterology

## 2022-12-09 VITALS — BP 108/67 | HR 53 | Temp 97.5°F | Resp 9 | Ht 65.0 in | Wt 186.0 lb

## 2022-12-09 DIAGNOSIS — K625 Hemorrhage of anus and rectum: Secondary | ICD-10-CM

## 2022-12-09 DIAGNOSIS — R1084 Generalized abdominal pain: Secondary | ICD-10-CM

## 2022-12-09 DIAGNOSIS — K319 Disease of stomach and duodenum, unspecified: Secondary | ICD-10-CM

## 2022-12-09 DIAGNOSIS — R110 Nausea: Secondary | ICD-10-CM | POA: Diagnosis not present

## 2022-12-09 MED ORDER — SODIUM CHLORIDE 0.9 % IV SOLN
500.0000 mL | Freq: Once | INTRAVENOUS | Status: DC
Start: 2022-12-09 — End: 2022-12-09

## 2022-12-09 NOTE — Progress Notes (Signed)
Called to room to assist during endoscopic procedure.  Patient ID and intended procedure confirmed with present staff. Received instructions for my participation in the procedure from the performing physician.  

## 2022-12-09 NOTE — Progress Notes (Unsigned)
To pacu, VSS. Report to [email or web address] 

## 2022-12-09 NOTE — Patient Instructions (Addendum)
There was some possible stomach inflammation seen and biopsied.  Colonoscopy showed hemorrhoids (cause of bleeding) but otherwise ok.  Once stomach biopsies return will notify results and recommendations.  So far nothing bad seen here or on CT scan.  Please stop in lab today for labs ordered on 6/18  I appreciate the opportunity to care for you. Iva Boop, MD, [facility]   Discharge instructions given. Handout on Hemorrhoids. Resume previous medications. YOU HAD AN ENDOSCOPIC PROCEDURE TODAY AT THE [facility]:   Refer to the procedure report that was given to you for any specific questions about what was found during the examination.  If the procedure report does not answer your questions, please call your gastroenterologist to clarify.  If you requested that your care partner not be given the details of your procedure findings, then the procedure report has been included in a sealed envelope for you to review at your convenience later.  YOU SHOULD EXPECT: Some feelings of bloating in the abdomen. Passage of more gas than usual.  Walking can help get rid of the air that was put into your GI tract during the procedure and reduce the bloating. If you had a lower endoscopy (such as a colonoscopy or flexible sigmoidoscopy) you may notice spotting of blood in your stool or on the toilet paper. If you underwent a bowel prep for your procedure, you may not have a normal bowel movement for a few days.  Please Note:  You might notice some irritation and congestion in your nose or some drainage.  This is from the oxygen used during your procedure.  There is no need for concern and it should clear up in a day or so.  SYMPTOMS TO REPORT IMMEDIATELY:  Following lower endoscopy (colonoscopy or flexible sigmoidoscopy):  Excessive amounts of blood in the stool  Significant tenderness or worsening of abdominal pains  Swelling of the abdomen that is new, acute  Fever of 100F or  higher  Following upper endoscopy (EGD)  Vomiting of blood or coffee ground material  New chest pain or pain under the shoulder blades  Painful or persistently difficult swallowing  New shortness of breath  Fever of 100F or higher  Black, tarry-looking stools  For urgent or emergent issues, a gastroenterologist can be reached at any hour by calling [phone]. Do not use MyChart messaging for urgent concerns.    DIET:  We do recommend a small meal at first, but then you may proceed to your regular diet.  Drink plenty of fluids but you should avoid alcoholic beverages for 24 hours.  ACTIVITY:  You should plan to take it easy for the rest of today and you should NOT DRIVE or use heavy machinery until tomorrow (because of the sedation medicines used during the test).    FOLLOW UP: Our staff will call the number listed on your records the next business day following your procedure.  We will call around 7:15- 8:00 am to check on you and address any questions or concerns that you may have regarding the information given to you following your procedure. If we do not reach you, we will leave a message.     If any biopsies were taken you will be contacted by phone or by letter within the next 1-3 weeks.  Please call us at [phone] if you have not heard about the biopsies in 3 weeks.    SIGNATURES/CONFIDENTIALITY: You and/or your care partner have signed paperwork which will be  entered into your electronic medical record.  These signatures attest to the fact that that the information above on your After Visit Summary has been reviewed and is understood.  Full responsibility of the confidentiality of this discharge information lies with you and/or your care-partner.

## 2022-12-09 NOTE — Progress Notes (Signed)
History and Physical Interval Note:  12/09/2022 1:09 PM  Jenna Henry  has presented today for endoscopic procedure(s), with the diagnosis of  Encounter Diagnoses  Name Primary?   Nausea without vomiting Yes   Rectal bleeding   .  The various methods of evaluation and treatment have been discussed with the patient and/or family. After consideration of risks, benefits and other options for treatment, the patient has consented to  the endoscopic procedure(s).   The patient's history has been reviewed, patient examined, no change in status, stable for endoscopic procedure(s).  I have reviewed the patient's chart and labs.  Questions were answered to the patient's satisfaction.     Iva Boop, MD, [facility]

## 2022-12-09 NOTE — Op Note (Signed)
[facility] Patient Name: Jenna Henry Procedure Date: 12/09/2022 1:17 PM MRN: [id number] Endoscopist: Iva Boop , MD, [id number] Age: 27 Referring MD:  Date of Birth: 08/18/1995 Gender: Female Account #: [id number] Procedure:                Colonoscopy Indications:              Rectal bleeding Medicines:                Monitored Anesthesia Care Procedure:                Pre-Anesthesia Assessment:                           - Prior to the procedure, a History and Physical                            was performed, and patient medications and                            allergies were reviewed. The patient's tolerance of                            previous anesthesia was also reviewed. The risks                            and benefits of the procedure and the sedation                            options and risks were discussed with the patient.                            All questions were answered, and informed consent                            was obtained. Prior Anticoagulants: The patient has                            taken no anticoagulant or antiplatelet agents. ASA                            Grade Assessment: II - A patient with mild systemic                            disease. After reviewing the risks and benefits,                            the patient was deemed in satisfactory condition to                            undergo the procedure.                           After obtaining informed consent, the colonoscope  was passed under direct vision. Throughout the                            procedure, the patient's blood pressure, pulse, and                            oxygen saturations were monitored continuously. The                            CF [id number] #[id number] was introduced through the anus                            and advanced to the the terminal ileum, with                            identification of the appendiceal orifice and IC                             valve. The colonoscopy was performed without                            difficulty. The patient tolerated the procedure                            well. The quality of the bowel preparation was                            good. The ileocecal valve, appendiceal orifice, and                            rectum were photographed. The bowel preparation                            used was SUPREP via split dose instruction. Scope In: 1:33:03 PM Scope Out: 1:43:03 PM Scope Withdrawal Time: 0 hours 7 minutes 55 seconds  Total Procedure Duration: 0 hours 10 minutes 0 seconds  Findings:                 The perianal and digital rectal examinations were                            normal.                           Internal hemorrhoids were found.                           The exam was otherwise without abnormality on                            direct and retroflexion views. Complications:            No immediate complications. Estimated Blood Loss:     Estimated blood loss: none. Impression:               - Internal hemorrhoids.                           -  The examination was otherwise normal on direct                            and retroflexion views.                           - No specimens collected. Recommendation:           - Patient has a contact number available for                            emergencies. The signs and symptoms of potential                            delayed complications were discussed with the                            patient. Return to normal activities tomorrow.                            Written discharge instructions were provided to the                            patient.                           - Resume previous diet.                           - Continue present medications.                           - No recommendation at this time regarding repeat                            colonoscopy due to young age.                           -  Working diagnosis currently IBS and hemorrhoidal                            bleeding - had ? gastritis at EGD - once path                            reviewed will call w/ plans Iva Boop, MD 12/09/2022 1:54:04 PM This report has been signed electronically.

## 2022-12-09 NOTE — Op Note (Signed)
[facility] Patient Name: Jenna Henry Procedure Date: 12/09/2022 1:17 PM MRN: [id number] Endoscopist: Iva Boop , MD, [id number] Age: 27 Referring MD:  Date of Birth: 1996/03/08 Gender: Female Account #: [id number] Procedure:                Upper GI endoscopy Indications:              Generalized abdominal pain, Nausea Medicines:                Monitored Anesthesia Care Procedure:                Pre-Anesthesia Assessment:                           - Prior to the procedure, a History and Physical                            was performed, and patient medications and                            allergies were reviewed. The patient's tolerance of                            previous anesthesia was also reviewed. The risks                            and benefits of the procedure and the sedation                            options and risks were discussed with the patient.                            All questions were answered, and informed consent                            was obtained. Prior Anticoagulants: The patient has                            taken no anticoagulant or antiplatelet agents. ASA                            Grade Assessment: II - A patient with mild systemic                            disease. After reviewing the risks and benefits,                            the patient was deemed in satisfactory condition to                            undergo the procedure.                           After obtaining informed consent, the endoscope was  passed under direct vision. Throughout the                            procedure, the patient's blood pressure, pulse, and                            oxygen saturations were monitored continuously. The                            Olympus Scope [id number] was introduced through the                            mouth, and advanced to the second part of duodenum.                            The upper GI  endoscopy was accomplished without                            difficulty. The patient tolerated the procedure                            well. Scope In: Scope Out: Findings:                 Localized mildly erythematous mucosa without                            bleeding was found in the prepyloric region of the                            stomach. Biopsies were taken with a cold forceps                            for histology. Verification of patient                            identification for the specimen was done. Estimated                            blood loss was minimal.                           The exam was otherwise without abnormality.                           The cardia and gastric fundus were normal on                            retroflexion. Complications:            No immediate complications. Estimated Blood Loss:     Estimated blood loss was minimal. Impression:               - Erythematous mucosa in the prepyloric region of  the stomach. Biopsied.                           - The examination was otherwise normal. Recommendation:           - Patient has a contact number available for                            emergencies. The signs and symptoms of potential                            delayed complications were discussed with the                            patient. Return to normal activities tomorrow.                            Written discharge instructions were provided to the                            patient.                           - Resume previous diet.                           - Continue present medications.                           - See the other procedure note for documentation of                            additional recommendations. Iva Boop, MD 12/09/2022 1:51:24 PM This report has been signed electronically.

## 2022-12-10 ENCOUNTER — Telehealth: Payer: Self-pay

## 2022-12-10 NOTE — Telephone Encounter (Signed)
Patient had a question about some residual gas, says she feels good other than that and is eating well. I advised for her to try a gas ex or simethicone for her gas and that should help. She agreed and had no further questions. I also advised that if it gets worse or she has other questions or concerns to give [place] a call back.

## 2022-12-10 NOTE — Telephone Encounter (Signed)
Patient returning call states she feels ok but still has a few f/u questions. Please advise

## 2022-12-10 NOTE — Telephone Encounter (Signed)
Attempted f/u call. VM not setup. 

## 2022-12-18 ENCOUNTER — Telehealth: Payer: Self-pay | Admitting: Internal Medicine

## 2022-12-18 MED ORDER — HYOSCYAMINE SULFATE ER 0.375 MG PO TB12: 0.3750 mg | ORAL_TABLET | Freq: Two times a day (BID) | ORAL | 3 refills | Status: DC

## 2022-12-18 NOTE — Telephone Encounter (Signed)
Spoke with patient & advised her that once Dr. Leone Payor has reviewed pathology results, she will receive a letter in mychart or a phone call from RN with his recommendations. It could take a couple weeks before she hears back. Pt verbalized all understanding.

## 2022-12-18 NOTE — Telephone Encounter (Signed)
Received MyChart message.  Patient is requesting to know the results of her recent procedures.  Please call.  Thank you.

## 2022-12-18 NOTE — Telephone Encounter (Signed)
Please tell patient that stomach biopsies show very slight stomach inflammation that is commonly seen and I do not think related to sxs  1)  Meds ordered this encounter  Medications   hyoscyamine (LEVBID) 0.375 MG 12 hr tablet    Sig: Take 1 tablet (0.375 mg total) by mouth 2 (two) times daily.    Dispense:  60 tablet    Refill:  3   2) Miralax daily for constipation  3) Needs to have GYN f/u for Polycystic ovary syndrome  4) F/U Quentin Mulling next available

## 2022-12-18 NOTE — Telephone Encounter (Signed)
Spoke with patient regarding MD recommendations. OV scheduled for 03/11/23 at 8:30 am with [place], [place]. Pt verbalized all understanding.

## 2023-01-13 MED ORDER — HYDROCORTISONE (PERIANAL) 2.5 % EX CREA: 1.0000 | TOPICAL_CREAM | Freq: Two times a day (BID) | CUTANEOUS | 2 refills | Status: DC

## 2023-01-13 MED ORDER — HYDROCORTISONE ACETATE 25 MG RE SUPP: 25.0000 mg | Freq: Two times a day (BID) | RECTAL | 0 refills | Status: DC

## 2023-01-26 ENCOUNTER — Encounter (HOSPITAL_COMMUNITY): Payer: Self-pay

## 2023-01-26 ENCOUNTER — Ambulatory Visit (HOSPITAL_COMMUNITY)
Admission: EM | Admit: 2023-01-26 | Discharge: 2023-01-26 | Disposition: A | Discharge status: Home / Self Care | Payer: Medicaid Other | Attending: Emergency Medicine | Admitting: Emergency Medicine

## 2023-01-26 ENCOUNTER — Ambulatory Visit (INDEPENDENT_AMBULATORY_CARE_PROVIDER_SITE_OTHER): Payer: Medicaid Other

## 2023-01-26 DIAGNOSIS — S6992XA Unspecified injury of left wrist, hand and finger(s), initial encounter: Secondary | ICD-10-CM

## 2023-01-26 MED ORDER — NAPROXEN 500 MG PO TABS: 500.0000 mg | ORAL_TABLET | Freq: Two times a day (BID) | ORAL | 0 refills | Status: DC

## 2023-01-26 NOTE — Discharge Instructions (Signed)
There is no fracture seen on your x-ray.  Wear the finger splint for a week to 10 days, then you may buddy tape thereafter for comfort.  Take Naprosyn twice a day.  Ice your finger after use.

## 2023-01-26 NOTE — ED Provider Notes (Signed)
HPI  SUBJECTIVE:  Jenna Henry is a right-handed 27 y.o. female who presents with left ring finger pain, limited movement, numbness tingling and swelling after jamming it on a car door yesterday.  Denies bruising.  She tried a cardboard splint without improvement in her symptoms.  Symptoms are worse with use.  She has a past medical history of asthma and PCOS.  LMP: August 13.  Denies the possibility of being pregnant.  PCP: [facility].    Past Medical History:  Diagnosis Date   Anxiety    Asthma    Depression    Herpes    PCOS (polycystic ovarian syndrome)    Polycystic ovaries     Past Surgical History:  Procedure Laterality Date   ANKLE RECONSTRUCTION Right 10/28/2021   Procedure: RIGHT ANTERIOR TALOFIBULAR LIGAMENT REPAIR AND SYNDESMOSIS FIXATION;  Surgeon: Nadara Mustard, MD;  Location: [facility];  Service: Orthopedics;  Laterality: Right;  regional block   CYSTECTOMY     axillary area per patient    Family History  Problem Relation Age of Onset   Depression Mother    Arthritis Mother    Hypertension Mother    Anxiety disorder Mother    Heart murmur Father    Hyperlipidemia Maternal Grandmother    Hypertension Maternal Grandfather    Heart disease Maternal Grandfather    Diabetes Maternal Grandfather    COPD Maternal Grandfather    Heart attack Maternal Grandfather    Colon cancer Neg Hx    Esophageal cancer Neg Hx    Rectal cancer Neg Hx    Stomach cancer Neg Hx     Social History   Tobacco Use   Smoking status: Former    Current packs/day: 0.50    Types: Cigarettes   Smokeless tobacco: Never   Tobacco comments:    vaping  Vaping Use   Vaping status: Every Day   Substances: Nicotine, Flavoring  Substance Use Topics   Alcohol use: Yes    Alcohol/week: 2.0 standard drinks of alcohol    Types: 2 Cans of beer per week    Comment: occassional   Drug use: Yes    Types: Marijuana    Comment: daily    No current  facility-administered medications for this encounter.  Current Outpatient Medications:    naproxen (NAPROSYN) 500 MG tablet, Take 1 tablet (500 mg total) by mouth 2 (two) times daily., Disp: 20 tablet, Rfl: 0   albuterol (VENTOLIN HFA) 108 (90 Base) MCG/ACT inhaler, Inhale 2 puffs into the lungs every 4 (four) hours as needed for wheezing or shortness of breath. (Must have office visit for refills), Disp: 6.7 g, Rfl: 2   hydrocortisone (ANUSOL-HC) 2.5 % rectal cream, Place 1 Application rectally 2 (two) times daily., Disp: 30 g, Rfl: 2   hydrocortisone (ANUSOL-HC) 25 MG suppository, Place 1 suppository (25 mg total) rectally 2 (two) times daily., Disp: 12 suppository, Rfl: 0   hyoscyamine (LEVBID) 0.375 MG 12 hr tablet, Take 1 tablet (0.375 mg total) by mouth 2 (two) times daily., Disp: 60 tablet, Rfl: 3   metFORMIN (GLUCOPHAGE) 500 MG tablet, Take 1 tablet (500 mg total) by mouth 2 (two) times daily with a meal., Disp: 180 tablet, Rfl: 3   ondansetron (ZOFRAN) 4 MG tablet, Take 1 tablet (4 mg total) by mouth every 8 (eight) hours as needed for nausea or vomiting., Disp: 20 tablet, Rfl: 0   pantoprazole (PROTONIX) 40 MG tablet, Take 1 tablet (40 mg total) by  mouth daily., Disp: 30 tablet, Rfl: 3   valACYclovir (VALTREX) 1000 MG tablet, Take 1 tablet (1,000 mg total) by mouth 2 (two) times daily., Disp: 14 tablet, Rfl: 2  Allergies  Allergen Reactions   Ortho Tri-Cyclen [Norgestimate-Eth Estradiol] Hives, Itching, Rash and Other (See Comments)    "Caused burning all over"     ROS  As noted in HPI.   Physical Exam  BP 101/80 (BP Location: Right Arm)   Pulse 63   Temp 98.5 F (36.9 C) (Oral)   Resp 18   LMP 01/12/2023 (Exact Date)   SpO2 100%   Constitutional: Well developed, well nourished, no acute distress Eyes:  EOMI, conjunctiva normal bilaterally HENT: Normocephalic, atraumatic,mucus membranes moist Respiratory: Normal inspiratory effort Cardiovascular: Normal rate GI:  nondistended skin: No rash, skin intact Musculoskeletal: Left ring finger: Tenderness at the middle phalanx, DIP, distal phalanx.  No tenderness at the MCP, proximal phalanx.  FDP/FDS intact.  2 point discrimination diminished.  Positive swelling along middle phalanx.  Hand otherwise nontender, normal. Neurologic: Alert & oriented x 3, no focal neuro deficits Psychiatric: Speech and behavior appropriate   ED Course   Medications - No data to display  Orders Placed This Encounter  Procedures   DG Finger Ring Left    Standing Status:   Standing    Number of Occurrences:   1    Order Specific Question:   Reason for Exam (SYMPTOM  OR DIAGNOSIS REQUIRED)    Answer:   Jammed finger yesterday, tenderness at middle phalanx, DIP rule out fracture   Apply finger splint static    Ring finger    Standing Status:   Standing    Number of Occurrences:   1    Order Specific Question:   Laterality    Answer:   Left    No results found for this or any previous visit (from the past 24 hour(s)). DG Finger Ring Left  Result Date: 01/26/2023 CLINICAL DATA:  Injury, tenderness and pain EXAM: LEFT RING FINGER 2+V COMPARISON:  None Available. FINDINGS: Soft tissue swelling evident. No acute osseous finding, fracture or malalignment. No joint abnormality or arthropathy. IMPRESSION: Soft tissue swelling. No acute osseous finding. Electronically Signed   By: Judie Petit.  Shick M.D.   On: 01/26/2023 10:46    ED Clinical Impression  1. Jammed finger (interphalangeal joint), left, initial encounter      ED Assessment/Plan     Reviewed imaging independently.  No fracture.  See radiology report for full details.  Patient with a jammed left ring finger.  Fracture on x-ray.  Will place in finger splint.  May buddy tape as well.  Home with Naprosyn.  No Tylenol because of history of suicide attempt with Tylenol.  Follow-up with PCP as needed.  Discussed  imaging, MDM, treatment plan, and plan for follow-up with  patient. . patient agrees with plan.   Meds ordered this encounter  Medications   naproxen (NAPROSYN) 500 MG tablet    Sig: Take 1 tablet (500 mg total) by mouth 2 (two) times daily.    Dispense:  20 tablet    Refill:  0      *This clinic note was created using Scientist, clinical (histocompatibility and immunogenetics). Therefore, there may be occasional mistakes despite careful proofreading.  ?    Domenick Gong, MD 01/27/23 1251

## 2023-01-26 NOTE — ED Notes (Signed)
No answer in waiting x2.  

## 2023-01-26 NOTE — ED Triage Notes (Signed)
Pt presents with c/o let hand swelling X this morning.   Pt states she stubbed her finger on a door.

## 2023-01-29 ENCOUNTER — Emergency Department (HOSPITAL_COMMUNITY): Payer: Medicaid Other

## 2023-01-29 ENCOUNTER — Other Ambulatory Visit: Payer: Self-pay

## 2023-01-29 ENCOUNTER — Emergency Department (HOSPITAL_COMMUNITY)
Admission: EM | Admit: 2023-01-29 | Discharge: 2023-01-29 | Disposition: A | Discharge status: Home / Self Care | Payer: Medicaid Other | Source: Home / Self Care | Attending: Emergency Medicine | Admitting: Emergency Medicine

## 2023-01-29 DIAGNOSIS — X501XXA Overexertion from prolonged static or awkward postures, initial encounter: Secondary | ICD-10-CM | POA: Diagnosis not present

## 2023-01-29 DIAGNOSIS — S66819A Strain of other specified muscles, fascia and tendons at wrist and hand level, unspecified hand, initial encounter: Secondary | ICD-10-CM

## 2023-01-29 DIAGNOSIS — S66115A Strain of flexor muscle, fascia and tendon of left ring finger at wrist and hand level, initial encounter: Secondary | ICD-10-CM | POA: Diagnosis not present

## 2023-01-29 DIAGNOSIS — S6992XA Unspecified injury of left wrist, hand and finger(s), initial encounter: Secondary | ICD-10-CM | POA: Diagnosis present

## 2023-01-29 MED ORDER — IBUPROFEN 200 MG PO TABS
600.0000 mg | ORAL_TABLET | Freq: Once | ORAL | Status: AC
  Administered 2023-01-29: 600 mg via ORAL
  Filled 2023-01-29: qty 3

## 2023-01-29 NOTE — Discharge Instructions (Signed)
Please wear the finger splint provided for the next 2 weeks.  You may take it off for hygiene purposes (showering, handwashing), but then resume wearing.   If your finger pain has not started to improve within the next 2 weeks, follow-up with the orthopedic office listed below.  Return the ER if you have any significant pain of your left ring finger, numbness or tingling of the left ring finger, any other new or concerning symptoms.

## 2023-01-29 NOTE — ED Provider Triage Note (Signed)
Emergency Medicine Provider Triage Evaluation Note  Jenna Henry , a 27 y.o. female  was evaluated in triage.  Pt complains of left hand numbness since 4PM today. Notes that her left ring finger has been numb since Tuesday and she went to ER and was told everything was fine and told to wrap it. Has been wrapping it and noticed today it was numb. Denies difficulty with speech, numbness to arm, or change in vision.  Review of Systems  Positive: paresthesia Negative: rash  Physical Exam  BP 111/69 (BP Location: Left Arm)   Pulse 70   Temp 98.9 F (37.2 C) (Oral)   Resp 16   LMP 01/12/2023 (Exact Date)   SpO2 99%  Gen:   Awake, no distress   Resp:  Normal effort  MSK:   Moves extremities without difficulty  Other:  No sensation to all digits in L hand. +radial pulse. 5/5 strength of BUE. No facial droop.   Medical Decision Making  Medically screening exam initiated at 5:14 PM.  Appropriate orders placed.  Jenna Henry was informed that the remainder of the evaluation will be completed by another provider, this initial triage assessment does not replace that evaluation, and the importance of remaining in the ED until their evaluation is complete.     Jenna Henry, [place] 01/29/23 [phone]

## 2023-01-29 NOTE — ED Triage Notes (Signed)
Pt coming today complaining of numbness in the left hand. Was seen recently for numbness in the left ring finger. Today pt noticed all fingers were numb while driving to work at [employer].

## 2023-01-29 NOTE — Progress Notes (Signed)
Orthopedic Tech Progress Note Patient Details:  Jenna Henry 08/08/1995 [id number]  Ortho Devices Type of Ortho Device: Finger splint Ortho Device/Splint Location: Left ring finger Ortho Device/Splint Interventions: Application   Post Interventions Patient Tolerated: Well  Jenna Henry 01/29/2023, 8:29 PM

## 2023-01-29 NOTE — ED Provider Notes (Signed)
[facility] AT [facility] Provider Note   CSN: [id number] Arrival date & time: 01/29/23  1639     History  Chief Complaint  Patient presents with   Numbness    Jenna Henry is a 27 y.o. female who presents to the ER for concern of left ring finger pain that started 8/27 when she went to close a door and her finger extended backwards.  She had immediate pain and notes some swelling of her finger.  She was evaluated at urgent care the same day where she was to have no fracture or dislocation.  They suspected a jammed left ring finger and placed the finger in a splint.  They prescribed her Naprosyn for pain over Tylenol since patient with a history of suicide attempt with Tylenol.  Upon evaluation today, patient not wearing splint and concerned that she has continued to have pain of the left ring finger  HPI     Home Medications Prior to Admission medications   Medication Sig Start Date End Date Taking? Authorizing Provider  albuterol (VENTOLIN HFA) 108 (90 Base) MCG/ACT inhaler Inhale 2 puffs into the lungs every 4 (four) hours as needed for wheezing or shortness of breath. (Must have office visit for refills) 06/23/22   [name], MD  hydrocortisone (ANUSOL-HC) 2.5 % rectal cream Place 1 Application rectally 2 (two) times daily. 01/13/23   Doree Albee, PA-C  hydrocortisone (ANUSOL-HC) 25 MG suppository Place 1 suppository (25 mg total) rectally 2 (two) times daily. 01/13/23   Doree Albee, PA-C  hyoscyamine (LEVBID) 0.375 MG 12 hr tablet Take 1 tablet (0.375 mg total) by mouth 2 (two) times daily. 12/18/22   Iva Boop, MD  metFORMIN (GLUCOPHAGE) 500 MG tablet Take 1 tablet (500 mg total) by mouth 2 (two) times daily with a meal. 06/23/22   [name], MD  naproxen (NAPROSYN) 500 MG tablet Take 1 tablet (500 mg total) by mouth 2 (two) times daily. 01/26/23   Domenick Gong, MD  ondansetron (ZOFRAN) 4 MG tablet Take 1 tablet  (4 mg total) by mouth every 8 (eight) hours as needed for nausea or vomiting. 11/19/22   [name], MD  pantoprazole (PROTONIX) 40 MG tablet Take 1 tablet (40 mg total) by mouth daily. 11/17/22   Doree Albee, PA-C  valACYclovir (VALTREX) 1000 MG tablet Take 1 tablet (1,000 mg total) by mouth 2 (two) times daily. 08/31/22   [name], MD      Allergies    Ortho tri-cyclen [norgestimate-eth estradiol]    Review of Systems   Review of Systems  Musculoskeletal:        Left ring finger pain    Physical Exam Updated Vital Signs BP 111/69 (BP Location: Left Arm)   Pulse 70   Temp 98.9 F (37.2 C) (Oral)   Resp 16   Ht 5\' 5"  (1.651 m)   Wt 83.5 kg   LMP 01/12/2023 (Exact Date)   SpO2 99%   BMI 30.62 kg/m  Physical Exam Vitals and nursing note reviewed.  Constitutional:      Appearance: Normal appearance.  HENT:     Head: Atraumatic.  Pulmonary:     Effort: Pulmonary effort is normal.  Musculoskeletal:     Comments: Left ring finger without any obvious deformity, edema, erythema or abrasions. Patient able to flex and extend fully at left fourth MCP, PIP, DIP. Mildly tender to palpation over the palmar aspect of the proximal  phalanx of the left ring finger, no tenderness to palpation over the dorsal aspect of the left ring finger Sensation intact of the left 1st- fifth digits   Neurological:     General: No focal deficit present.     Mental Status: She is alert.  Psychiatric:        Mood and Affect: Mood normal.        Behavior: Behavior normal.     ED Results / Procedures / Treatments   Labs (all labs ordered are listed, but only abnormal results are displayed) Labs Reviewed - No data to display  EKG None  Radiology DG Hand Complete Left  Result Date: 01/29/2023 CLINICAL DATA:  Numbness and pain all digits EXAM: LEFT HAND - COMPLETE 3+ VIEW COMPARISON:  01/26/2023 FINDINGS: There is no evidence of fracture or dislocation. There is no evidence of  arthropathy or other focal bone abnormality. Soft tissues are unremarkable. IMPRESSION: Negative. Electronically Signed   By: Minerva Fester M.D.   On: 01/29/2023 18:01    Procedures Procedures    Medications Ordered in ED Medications - No data to display  ED Course/ Medical Decision Making/ A&P                                 Medical Decision Making  27 y.o. female presents to the ED for concern of left ring finger pain after hyperextending the finger when closing a door on 8/27  Differential diagnosis includes but is not limited to fracture, dislocation, tendon injury, flexor tenosynovitis  ED Course:  Patient presenting with concern for ongoing left finger pain after hyperextending the finger when closing a door on 8/27.  She was initially evaluated at urgent care and found to have no acute fractures or dislocations.  They placed her in a splint and was told to take Naprosyn for pain.  She comes to the ER today because her pain has not improved.  She has not been taking her Naprosyn.  She is not wearing her splint provided at the urgent care.  Upon examination of the finger, there is no obvious deformity, edema, erythema.  The patient is able to fully flex and extend at the left fourth MCP, PIP, DIP.  She has slight tenderness to palpation over the proximal phalanx of the left fourth digit.  X rays  show no signs of fracture or dislocation.  Low suspicion for flexor tenosynovitis given no fusiform swelling of the digit, finger with normal range of motion, no pain with extension.  Given the mechanism of injury, concern for hyperextension of the flexor tendon which may be contributing to her pain.  She has not been wearing the splint provided from urgent care.  I discussed with her she needs to wear the splint provided for the next 2 weeks.  Provided information for orthopedic offices to schedule and appointment with if her symptoms have not improved within the next 2 weeks of wearing the  splint.    Impression: Left ring finger hyperextension  Disposition:  The patient was discharged home with instructions to wear the provided splint for the next 2 weeks.  She may remove the splint for hygiene purposes only.  Follow-up with orthopedics if her symptoms have not started to improve within the next 2 weeks. Return precautions given.  Imaging Studies ordered: I ordered imaging studies including x-ray left fourth finger I independently visualized the imaging with scope of interpretation limited  to determining acute life threatening conditions related to emergency care. Imaging showed no acute fractures or dislocations I agree with the radiologist interpretation            Final Clinical Impression(s) / ED Diagnoses Final diagnoses:  Strain of flexor digitorum profundus tendon    Rx / DC Orders ED Discharge Orders     None         Arabella Merles, Cordelia Poche 01/29/23 2041    Terrilee Files, MD 01/30/23 1058

## 2023-02-02 ENCOUNTER — Encounter: Payer: Self-pay | Admitting: Adult Health

## 2023-02-02 ENCOUNTER — Ambulatory Visit (INDEPENDENT_AMBULATORY_CARE_PROVIDER_SITE_OTHER): Payer: Medicaid Other | Admitting: Adult Health

## 2023-02-02 VITALS — BP 110/60 | HR 57 | Temp 98.5°F | Ht 65.0 in | Wt 181.0 lb

## 2023-02-02 DIAGNOSIS — S66819A Strain of other specified muscles, fascia and tendons at wrist and hand level, unspecified hand, initial encounter: Secondary | ICD-10-CM

## 2023-02-02 NOTE — Progress Notes (Signed)
Subjective:    Patient ID: Jenna Henry, female    DOB: January 01, 1996, 27 y.o.   MRN: [id number]  HPI  27 year old female who  has a past medical history of Anxiety, Asthma, Depression, Herpes, PCOS (polycystic ovarian syndrome), and Polycystic ovaries.  She was seen in the emergency room 4 days ago after being seen urgent care 3 days prior.  Both visits for for pain in her left ring finger that did when she went to close the door and her finger extended backwards.  The ER she had an x-ray done that was negative for fracture or dislocation.  The mechanism of injury was concerning for hyperextension of the flexor tendon which was likely contributing to pain.  She was advised to wear her splint that she received at urgent care and take naproxen.  If no better in 2 weeks then follow-up with orthopedics   Today she reports that she has been wearing her splint but has not been using naproxen due to, being afraid to take medications".  Review of Systems See HPI   Past Medical History:  Diagnosis Date   Anxiety    Asthma    Depression    Herpes    PCOS (polycystic ovarian syndrome)    Polycystic ovaries     Social History   Socioeconomic History   Marital status: Single    Spouse name: Not on file   Number of children: Not on file   Years of education: Not on file   Highest education level: Some college, no degree  Occupational History   Not on file  Tobacco Use   Smoking status: Former    Current packs/day: 0.50    Types: Cigarettes   Smokeless tobacco: Never   Tobacco comments:    vaping  Vaping Use   Vaping status: Every Day   Substances: Nicotine, Flavoring  Substance and Sexual Activity   Alcohol use: Yes    Alcohol/week: 2.0 standard drinks of alcohol    Types: 2 Cans of beer per week    Comment: occassional   Drug use: Yes    Types: Marijuana    Comment: daily   Sexual activity: Yes    Birth control/protection: None  Other Topics Concern   Not on file  Social  History Narrative   Not on file   Social Determinants of Health   Financial Resource Strain: Medium Risk (09/22/2022)   Overall Financial Resource Strain (CARDIA)    Difficulty of Paying Living Expenses: Somewhat hard  Food Insecurity: Patient Declined (09/22/2022)   Hunger Vital Sign    Worried About Running Out of Food in the Last Year: Patient declined    Ran Out of Food in the Last Year: Patient declined  Transportation Needs: No Transportation Needs (09/22/2022)   PRAPARE - Administrator, Civil Service (Medical): No    Lack of Transportation (Non-Medical): No  Physical Activity: Sufficiently Active (09/22/2022)   Exercise Vital Sign    Days of Exercise per Week: 5 days    Minutes of Exercise per Session: 150+ min  Stress: No Stress Concern Present (09/22/2022)   [employer] of Occupational Health - Occupational Stress Questionnaire    Feeling of Stress : Only a little  Social Connections: Socially Isolated (09/22/2022)   Social Connection and Isolation Panel [NHANES]    Frequency of Communication with Friends and Family: Once a week    Frequency of Social Gatherings with Friends and Family: More than three times a  week    Attends Religious Services: Never    Active Member of Clubs or Organizations: No    Attends Engineer, structural: Not on file    Marital Status: Never married  Intimate Partner Violence: Unknown (09/05/2021)   Received from [facility]   HITS    Physically Hurt: Not on file    Insult or Talk Down To: Not on file    Threaten Physical Harm: Not on file    Scream or Curse: Not on file    Past Surgical History:  Procedure Laterality Date   ANKLE RECONSTRUCTION Right 10/28/2021   Procedure: RIGHT ANTERIOR TALOFIBULAR LIGAMENT REPAIR AND SYNDESMOSIS FIXATION;  Surgeon: Nadara Mustard, MD;  Location: [facility];  Service: Orthopedics;  Laterality: Right;  regional block   CYSTECTOMY     axillary area per  patient    Family History  Problem Relation Age of Onset   Depression Mother    Arthritis Mother    Hypertension Mother    Anxiety disorder Mother    Heart murmur Father    Hyperlipidemia Maternal Grandmother    Hypertension Maternal Grandfather    Heart disease Maternal Grandfather    Diabetes Maternal Grandfather    COPD Maternal Grandfather    Heart attack Maternal Grandfather    Colon cancer Neg Hx    Esophageal cancer Neg Hx    Rectal cancer Neg Hx    Stomach cancer Neg Hx     Allergies  Allergen Reactions   Ortho Tri-Cyclen [Norgestimate-Eth Estradiol] Hives, Itching, Rash and Other (See Comments)    "Caused burning all over"    Current Outpatient Medications on File Prior to Visit  Medication Sig Dispense Refill   albuterol (VENTOLIN HFA) 108 (90 Base) MCG/ACT inhaler Inhale 2 puffs into the lungs every 4 (four) hours as needed for wheezing or shortness of breath. (Must have office visit for refills) 6.7 g 2   hydrocortisone (ANUSOL-HC) 2.5 % rectal cream Place 1 Application rectally 2 (two) times daily. 30 g 2   hydrocortisone (ANUSOL-HC) 25 MG suppository Place 1 suppository (25 mg total) rectally 2 (two) times daily. 12 suppository 0   hyoscyamine (LEVBID) 0.375 MG 12 hr tablet Take 1 tablet (0.375 mg total) by mouth 2 (two) times daily. 60 tablet 3   metFORMIN (GLUCOPHAGE) 500 MG tablet Take 1 tablet (500 mg total) by mouth 2 (two) times daily with a meal. 180 tablet 3   naproxen (NAPROSYN) 500 MG tablet Take 1 tablet (500 mg total) by mouth 2 (two) times daily. 20 tablet 0   ondansetron (ZOFRAN) 4 MG tablet Take 1 tablet (4 mg total) by mouth every 8 (eight) hours as needed for nausea or vomiting. 20 tablet 0   pantoprazole (PROTONIX) 40 MG tablet Take 1 tablet (40 mg total) by mouth daily. 30 tablet 3   valACYclovir (VALTREX) 1000 MG tablet Take 1 tablet (1,000 mg total) by mouth 2 (two) times daily. 14 tablet 2   No current facility-administered medications on  file prior to visit.    BP 110/60   Pulse (!) 57   Temp 98.5 F (36.9 C) (Oral)   Ht 5\' 5"  (1.651 m)   Wt 181 lb (82.1 kg)   LMP 01/12/2023 (Exact Date)   SpO2 98%   BMI 30.12 kg/m       Objective:   Physical Exam Vitals and nursing note reviewed.  Constitutional:      Appearance: Normal appearance.  Musculoskeletal:        General: Tenderness present. No deformity.     Comments: There was no obvious deformity, edema, erythema, or abrasions to the left ring finger.  She did have difficulty and pain when trying to flex and extend fully her left fourth finger.  She is mildly tender to palpation over the PIP and MCP joints.  No pain to the palmar aspect of the proximal Larynex of the left ring finger.     Skin:    General: Skin is warm.  Neurological:     General: No focal deficit present.     Mental Status: She is oriented to person, place, and time.  Psychiatric:        Mood and Affect: Mood normal.        Behavior: Behavior normal.        Thought Content: Thought content normal.        Judgment: Judgment normal.        Assessment & Plan:  1. Strain of flexor digitorum profundus tendon -Continue to wear brace at all times.  Also encouraged to use naproxen, explained to her that this was not like a narcotic pain medication.  This medication will help with inflammation.  Information given on orthopedic clinics to contact if no improvement in 2 weeks.  Shirline Frees, NP

## 2023-02-02 NOTE — Patient Instructions (Signed)
Wear splint and take Naprosyn   If no better in two weeks then contact   [facility]  [facility] [address] ([address] 2) [place], [place] [address] Phone: [phone]

## 2023-02-12 DIAGNOSIS — M79645 Pain in left finger(s): Secondary | ICD-10-CM | POA: Insufficient documentation

## 2023-02-12 HISTORY — DX: Pain in left finger(s): M79.645

## 2023-03-02 ENCOUNTER — Ambulatory Visit: Payer: Medicaid Other | Admitting: Internal Medicine

## 2023-03-02 ENCOUNTER — Other Ambulatory Visit (INDEPENDENT_AMBULATORY_CARE_PROVIDER_SITE_OTHER): Payer: Medicaid Other

## 2023-03-02 ENCOUNTER — Encounter: Payer: Self-pay | Admitting: Internal Medicine

## 2023-03-02 VITALS — BP 100/70 | HR 60 | Temp 98.2°F | Ht 65.0 in | Wt 180.0 lb

## 2023-03-02 DIAGNOSIS — R55 Syncope and collapse: Secondary | ICD-10-CM | POA: Diagnosis not present

## 2023-03-02 NOTE — Progress Notes (Unsigned)
Enrolled patient for a 14 day Zio AT monitor to be mailed to patients home.  

## 2023-03-02 NOTE — Progress Notes (Signed)
[facility] [address] [place] [address] Dept: [phone] Dept Fax: [phone]  Acute Care Office Visit  Subjective:   Jenna Henry [birth year] 03/02/2023  Chief Complaint  Patient presents with   Loss of Consciousness    HPI: Jenna Henry is a 27 yo F with PMHx significant for PCOS, asthma, anxiety, and hypokalemia who complains of possible syncopal episodes. She reports having 3 syncopal episodes over the past 1 month.   She reports only on the first episode she had "shaking" prior to syncope. She reports upon awakening, she was able to hear her surroundings, but was unable to have any verbal or motor response. No hx of seizures. No motor or sensory deficits.   She reports having chest pain prior to the 2nd and 3rd syncopal episodes. She tried using her albuterol inhaler thinking the chest pain was related to her asthma, but it did not alleviate the pain. Patient states she will be walking and will "wake up" on the floor, realizing she had another episode. Associated diaphoresis and flushing with each episode. Last episode of chest pain was 1 day ago. She is able to recall events leading up to and after each episode.  Unknown length of time episodes last for.    Family history: Mother has SVT, had similar episodes when she was patient's age. Had cardiac ablation and on current beta blocker medication.     The following portions of the patient's history were reviewed and updated as appropriate: past medical history, past surgical history, family history, social history, allergies, medications, and problem list.   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   Palpitations 11/09/2017   Chest pain 11/09/2017   Anxiety state 02/28/2017   Insomnia 02/28/2017   Herpes 11/12/2016   Irregular menstruation 07/06/2016   Past Medical History:  Diagnosis Date   Anxiety    Asthma    Depression    Herpes    PCOS (polycystic ovarian syndrome)    Polycystic ovaries    Past Surgical History:  Procedure Laterality Date   ANKLE RECONSTRUCTION Right 10/28/2021   Procedure: RIGHT ANTERIOR TALOFIBULAR LIGAMENT REPAIR AND SYNDESMOSIS FIXATION;  Surgeon: Nadara Mustard, MD;  Location: [facility];  Service: Orthopedics;  Laterality: Right;  regional block   CYSTECTOMY     axillary area per patient   Family History  Problem Relation Age of Onset   Depression Mother    Arthritis Mother    Hypertension Mother    Anxiety disorder Mother    Heart murmur Father    Hyperlipidemia Maternal Grandmother    Hypertension Maternal Grandfather    Heart disease Maternal Grandfather    Diabetes Maternal Grandfather    COPD Maternal Grandfather    Heart attack Maternal Grandfather    Colon cancer Neg Hx    Esophageal cancer Neg Hx    Rectal cancer Neg Hx    Stomach cancer Neg Hx     Current Outpatient Medications:    albuterol (VENTOLIN HFA) 108 (  90 Base) MCG/ACT inhaler, Inhale 2 puffs into the lungs every 4 (four) hours as needed for wheezing or shortness of breath. (Must have office visit for refills), Disp: 6.7 g, Rfl: 2   valACYclovir (VALTREX) 1000 MG tablet, Take 1 tablet (1,000 mg total) by mouth 2 (two) times daily., Disp: 14 tablet, Rfl: 2   hydrocortisone (ANUSOL-HC) 2.5 % rectal cream, Place 1 Application rectally 2 (two) times daily. (Patient not taking: Reported on 03/02/2023), Disp: 30 g, Rfl: 2   hydrocortisone (ANUSOL-HC) 25 MG  suppository, Place 1 suppository (25 mg total) rectally 2 (two) times daily. (Patient not taking: Reported on 03/02/2023), Disp: 12 suppository, Rfl: 0   hyoscyamine (LEVBID) 0.375 MG 12 hr tablet, Take 1 tablet (0.375 mg total) by mouth 2 (two) times daily. (Patient not taking: Reported on 03/02/2023), Disp: 60 tablet, Rfl: 3   lidocaine (HM LIDOCAINE PATCH) 4 %, Place 1 patch onto the skin daily., Disp: 30 patch, Rfl: 0   metFORMIN (GLUCOPHAGE) 500 MG tablet, Take 1 tablet (500 mg total) by mouth 2 (two) times daily with a meal. (Patient not taking: Reported on 03/02/2023), Disp: 180 tablet, Rfl: 3   naproxen (NAPROSYN) 500 MG tablet, Take 1 tablet (500 mg total) by mouth 2 (two) times daily., Disp: 20 tablet, Rfl: 0   ondansetron (ZOFRAN) 4 MG tablet, Take 1 tablet (4 mg total) by mouth every 8 (eight) hours as needed for nausea or vomiting. (Patient not taking: Reported on 03/02/2023), Disp: 20 tablet, Rfl: 0   pantoprazole (PROTONIX) 40 MG tablet, Take 1 tablet (40 mg total) by mouth daily. (Patient not taking: Reported on 03/02/2023), Disp: 30 tablet, Rfl: 3 Allergies  Allergen Reactions   Ortho Tri-Cyclen [Norgestimate-Eth Estradiol] Hives, Itching, Rash and Other (See Comments)    "Caused burning all over"     ROS: A complete ROS was performed with pertinent positives/negatives noted in the HPI. The remainder of the ROS are negative.    Objective:   Today's Vitals   03/02/23 1453  BP: 100/70  Pulse: 60  Temp: 98.2 F (36.8 C)  TempSrc: Temporal  SpO2: 99%  Weight: 180 lb (81.6 kg)  Height: 5\' 5"  (1.651 m)    GENERAL: Well-appearing, in NAD. Well nourished.  SKIN: Pink, warm and dry. No rash, lesion, ulceration, or ecchymoses.  NECK: Trachea midline. Full ROM w/o pain or tenderness. No lymphadenopathy.  RESPIRATORY: Chest wall symmetrical. Respirations even and non-labored. Breath sounds clear to auscultation bilaterally.  CARDIAC: S1, S2 present, regular rate and rhythm.  Peripheral pulses 2+ bilaterally.  MSK: Muscle tone and strength appropriate for age.  EXTREMITIES: Without clubbing, cyanosis, or edema.  NEUROLOGIC: PERRLA. No motor or sensory deficits. Steady, even gait.  PSYCH/MENTAL STATUS: Alert, oriented x 3. Cooperative, appropriate mood and affect.   EKG RESULT: EKG tracing is personally reviewed.   EKG: sinus bradycardia, PAC's noted.   Assessment & Plan:   Syncope, unspecified syncope type -     EKG 12-Lead -     CBC with Differential/Platelet -     Comprehensive metabolic panel -     TSH -     LONG TERM MONITOR-LIVE TELEMETRY (3-14 DAYS); Future   Patient and mother made aware if chest pain occurs and does not alleviate or patient has further episodes of syncope prior to doing Zio-patch monitoring, to call 911 or go to ER immediately. Patient and mother verbalized understanding and in agreement with plan of care.   Orders Placed This Encounter  Procedures  CBC w/Diff   Comp Met (CMET)   TSH   LONG TERM MONITOR-LIVE TELEMETRY (3-14 DAYS)    Syncopal episodes    Standing Status:   Future    Number of Occurrences:   1    Standing Expiration Date:   03/01/2024    Order Specific Question:   Where should this test be performed?    Answer:   [facility]    Order Specific Question:   Does the patient have an implanted cardiac device?    Answer:   No    Order Specific Question:   Prescribed days of wear    Answer:   39    Order Specific Question:   Type of enrollment    Answer:   Home Enrollment    Order Specific Question:   Vendor:    Answer:   [employer]    Order Specific Question:   Emergency contact    Answer:   Scott,Leslie    Order Specific Question:   Emergency contact mobile    Answer:   [phone]    Order Specific Question:   Emergency contact home    Answer:   [phone]   EKG 12-Lead   Lab Orders         CBC w/Diff         Comp Met (CMET)         TSH     No images are attached to the encounter or orders placed in  the encounter.  Salvatore Decent, FNP

## 2023-03-03 ENCOUNTER — Emergency Department (HOSPITAL_BASED_OUTPATIENT_CLINIC_OR_DEPARTMENT_OTHER)
Admission: EM | Admit: 2023-03-03 | Discharge: 2023-03-03 | Disposition: A | Discharge status: Home / Self Care | Payer: Medicaid Other | Attending: Emergency Medicine | Admitting: Emergency Medicine

## 2023-03-03 ENCOUNTER — Encounter (HOSPITAL_BASED_OUTPATIENT_CLINIC_OR_DEPARTMENT_OTHER): Payer: Self-pay

## 2023-03-03 ENCOUNTER — Emergency Department (HOSPITAL_BASED_OUTPATIENT_CLINIC_OR_DEPARTMENT_OTHER): Payer: Medicaid Other

## 2023-03-03 ENCOUNTER — Other Ambulatory Visit: Payer: Self-pay

## 2023-03-03 DIAGNOSIS — J45909 Unspecified asthma, uncomplicated: Secondary | ICD-10-CM | POA: Insufficient documentation

## 2023-03-03 DIAGNOSIS — R0789 Other chest pain: Secondary | ICD-10-CM | POA: Insufficient documentation

## 2023-03-03 LAB — COMPREHENSIVE METABOLIC PANEL
ALT: 16 U/L (ref 0–35)
AST: 14 U/L (ref 0–37)
Albumin: 4.2 g/dL (ref 3.5–5.2)
Alkaline Phosphatase: 57 U/L (ref 39–117)
BUN: 4 mg/dL — ABNORMAL LOW (ref 6–23)
CO2: 26 meq/L (ref 19–32)
Calcium: 10.4 mg/dL (ref 8.4–10.5)
Chloride: 107 meq/L (ref 96–112)
Creatinine, Ser: 0.67 mg/dL (ref 0.40–1.20)
GFR: 121.71 mL/min (ref >60.00)
Glucose, Bld: 81 mg/dL (ref 70–99)
Potassium: 4.2 meq/L (ref 3.5–5.1)
Sodium: 140 meq/L (ref 135–145)
Total Bilirubin: 0.3 mg/dL (ref 0.2–1.2)
Total Protein: 7 g/dL (ref 6.0–8.3)

## 2023-03-03 LAB — BASIC METABOLIC PANEL
Anion gap: 6 (ref 5–15)
BUN: 6 mg/dL (ref 6–20)
CO2: 25 mmol/L (ref 22–32)
Calcium: 10.3 mg/dL (ref 8.9–10.3)
Chloride: 107 mmol/L (ref 98–111)
Creatinine, Ser: 0.63 mg/dL (ref 0.44–1.00)
GFR, Estimated: >60 mL/min (ref >60)
Glucose, Bld: 111 mg/dL — ABNORMAL HIGH (ref 70–99)
Potassium: 3.6 mmol/L (ref 3.5–5.1)
Sodium: 138 mmol/L (ref 135–145)

## 2023-03-03 LAB — CBC WITH DIFFERENTIAL/PLATELET
Basophils Absolute: 0.1 10*3/uL (ref 0.0–0.1)
Basophils Relative: 0.9 % (ref 0.0–3.0)
Eosinophils Absolute: 0.2 10*3/uL (ref 0.0–0.7)
Eosinophils Relative: 2.9 % (ref 0.0–5.0)
HCT: 43.5 % (ref 36.0–46.0)
Hemoglobin: 14.2 g/dL (ref 12.0–15.0)
Lymphocytes Relative: 40.5 % (ref 12.0–46.0)
Lymphs Abs: 2.5 10*3/uL (ref 0.7–4.0)
MCHC: 32.5 g/dL (ref 30.0–36.0)
MCV: 96.2 fL (ref 78.0–100.0)
Monocytes Absolute: 0.3 10*3/uL (ref 0.1–1.0)
Monocytes Relative: 4.7 % (ref 3.0–12.0)
Neutro Abs: 3.1 10*3/uL (ref 1.4–7.7)
Neutrophils Relative %: 51 % (ref 43.0–77.0)
Platelets: 230 10*3/uL (ref 150.0–400.0)
RBC: 4.53 Mil/uL (ref 3.87–5.11)
RDW: 14.3 % (ref 11.5–15.5)
WBC: 6.1 10*3/uL (ref 4.0–10.5)

## 2023-03-03 LAB — CBC
HCT: 37.9 % (ref 36.0–46.0)
Hemoglobin: 12.5 g/dL (ref 12.0–15.0)
MCH: 31.6 pg (ref 26.0–34.0)
MCHC: 33 g/dL (ref 30.0–36.0)
MCV: 95.7 fL (ref 80.0–100.0)
Platelets: 235 10*3/uL (ref 150–400)
RBC: 3.96 MIL/uL (ref 3.87–5.11)
RDW: 13.6 % (ref 11.5–15.5)
WBC: 7.1 10*3/uL (ref 4.0–10.5)
nRBC: 0 % (ref 0.0–0.2)

## 2023-03-03 LAB — TROPONIN I (HIGH SENSITIVITY): Troponin I (High Sensitivity): <2 ng/L (ref <18)

## 2023-03-03 LAB — TSH: TSH: 1.41 u[IU]/mL (ref 0.35–5.50)

## 2023-03-03 MED ORDER — LIDOCAINE 4 % EX PTCH: 1.0000 | MEDICATED_PATCH | CUTANEOUS | 0 refills | Status: DC

## 2023-03-03 MED ORDER — ALUM & MAG HYDROXIDE-SIMETH 200-200-20 MG/5ML PO SUSP
30.0000 mL | Freq: Once | ORAL | Status: AC
  Administered 2023-03-03: 30 mL via ORAL
  Filled 2023-03-03: qty 30

## 2023-03-03 MED ORDER — LIDOCAINE 5 % EX PTCH
1.0000 | MEDICATED_PATCH | CUTANEOUS | Status: DC
  Administered 2023-03-03: 1 via TRANSDERMAL
  Filled 2023-03-03: qty 1

## 2023-03-03 MED ORDER — LIDOCAINE VISCOUS HCL 2 % MT SOLN
15.0000 mL | Freq: Once | OROMUCOSAL | Status: AC
  Administered 2023-03-03: 15 mL via ORAL
  Filled 2023-03-03: qty 15

## 2023-03-03 NOTE — Discharge Instructions (Addendum)
Your blood work today was reassuring, I believe that your pain is likely secondary to musculoskeletal pain.  Please follow-up with your primary care doctor for further evaluation.  I prescribed you some lidocaine patches, and you can use Aleve over-the-counter, for pain control.

## 2023-03-03 NOTE — ED Provider Notes (Signed)
[place] EMERGENCY DEPARTMENT AT [facility] Provider Note   CSN: [id number] Arrival date & time: 03/03/23  2009     History  Chief Complaint  Patient presents with   Chest Pain    Jenna Henry is a 27 y.o. female, History of seizures, asthma, who presents to the ED secondary to chest pain, that been going on for the last day and a half.  She states she suddenly started having some chest pain, in the middle of her chest, that been going on for the last couple days.  Notes that she was not walking, or doing anything in specific when it happened.  States it is a 9 out of 10, and feels like pressure.  Denies any kind of nausea, vomiting, abdominal pain or shortness of breath.  No use of oral contraceptives, or recent trauma.  No history of DVT or PE.  Has not taken anything for the pain.  She states he used to take antiinflammatories, but has not taken them as of late.  Denies any palpitations     Home Medications Prior to Admission medications   Medication Sig Start Date End Date Taking? Authorizing Provider  lidocaine (HM LIDOCAINE PATCH) 4 % Place 1 patch onto the skin daily. 03/03/23  Yes De Libman L, PA  albuterol (VENTOLIN HFA) 108 (90 Base) MCG/ACT inhaler Inhale 2 puffs into the lungs every 4 (four) hours as needed for wheezing or shortness of breath. (Must have office visit for refills) 06/23/22   [name], MD  hydrocortisone (ANUSOL-HC) 2.5 % rectal cream Place 1 Application rectally 2 (two) times daily. Patient not taking: Reported on 03/02/2023 01/13/23   Doree Albee, PA-C  hydrocortisone (ANUSOL-HC) 25 MG suppository Place 1 suppository (25 mg total) rectally 2 (two) times daily. Patient not taking: Reported on 03/02/2023 01/13/23   Doree Albee, PA-C  hyoscyamine (LEVBID) 0.375 MG 12 hr tablet Take 1 tablet (0.375 mg total) by mouth 2 (two) times daily. Patient not taking: Reported on 03/02/2023 12/18/22   Iva Boop, MD  metFORMIN (GLUCOPHAGE)  500 MG tablet Take 1 tablet (500 mg total) by mouth 2 (two) times daily with a meal. Patient not taking: Reported on 03/02/2023 06/23/22   [name], MD  naproxen (NAPROSYN) 500 MG tablet Take 1 tablet (500 mg total) by mouth 2 (two) times daily. 01/26/23   Domenick Gong, MD  ondansetron (ZOFRAN) 4 MG tablet Take 1 tablet (4 mg total) by mouth every 8 (eight) hours as needed for nausea or vomiting. Patient not taking: Reported on 03/02/2023 11/19/22   [name], MD  pantoprazole (PROTONIX) 40 MG tablet Take 1 tablet (40 mg total) by mouth daily. Patient not taking: Reported on 03/02/2023 11/17/22   Doree Albee, PA-C  valACYclovir (VALTREX) 1000 MG tablet Take 1 tablet (1,000 mg total) by mouth 2 (two) times daily. 08/31/22   [name], MD      Allergies    Ortho tri-cyclen [norgestimate-eth estradiol]    Review of Systems   Review of Systems  Respiratory:  Negative for shortness of breath.   Cardiovascular:  Positive for chest pain.    Physical Exam Updated Vital Signs BP 118/69   Pulse 88   Temp 98 F (36.7 C)   Resp 17   Ht 5\' 5"  (1.651 m)   Wt 81.6 kg   LMP 02/10/2023 (Exact Date)   SpO2 99%   BMI 29.94 kg/m  Physical Exam Vitals and nursing  note reviewed.  Constitutional:      General: She is not in acute distress.    Appearance: She is well-developed.  HENT:     Head: Normocephalic and atraumatic.  Eyes:     Conjunctiva/sclera: Conjunctivae normal.  Cardiovascular:     Rate and Rhythm: Normal rate and regular rhythm.     Heart sounds: No murmur heard. Pulmonary:     Effort: Pulmonary effort is normal. No respiratory distress.     Breath sounds: Normal breath sounds.  Chest:     Comments: Tenderness to palpation of sternum.  No crepitus, erythema, edema Abdominal:     Palpations: Abdomen is soft.     Tenderness: There is no abdominal tenderness.  Musculoskeletal:        General: No swelling.     Cervical back: Neck supple.   Skin:    General: Skin is warm and dry.     Capillary Refill: Capillary refill takes less than 2 seconds.  Neurological:     Mental Status: She is alert.  Psychiatric:        Mood and Affect: Mood normal.     ED Results / Procedures / Treatments   Labs (all labs ordered are listed, but only abnormal results are displayed) Labs Reviewed  BASIC METABOLIC PANEL - Abnormal; Notable for the following components:      Result Value   Glucose, Bld 111 (*)    All other components within normal limits  CBC  PREGNANCY, URINE  TROPONIN I (HIGH SENSITIVITY)    EKG None  Radiology DG Chest 1 View  Result Date: 03/03/2023 CLINICAL DATA:  Chest pain EXAM: CHEST  1 VIEW COMPARISON:  07/17/2020 FINDINGS: Normal cardiomediastinal silhouette. No focal consolidation, pleural effusion, or pneumothorax. No displaced rib fractures. IMPRESSION: No acute cardiopulmonary disease. Electronically Signed   By: Minerva Fester M.D.   On: 03/03/2023 21:29    Procedures Procedures    Medications Ordered in ED Medications  lidocaine (LIDODERM) 5 % 1 patch (1 patch Transdermal Patch Applied 03/03/23 2244)  alum & mag hydroxide-simeth (MAALOX/MYLANTA) 200-200-20 MG/5ML suspension 30 mL (30 mLs Oral Given 03/03/23 2244)    And  lidocaine (XYLOCAINE) 2 % viscous mouth solution 15 mL (15 mLs Oral Given 03/03/23 2244)    ED Course/ Medical Decision Making/ A&P                                 Medical Decision Making Patient is a 27 year old female, here for chest pain, that is been going for last couple days, she says it is of pressurized, EKG shows normal sinus rhythm.  Pain is worse upon palpation I evaluated her sternum, she has not had any trauma, we will obtain a chest x-ray, for further evaluation as well as blood work including troponin.  She is PERC negative thus no D-dimer ordered.  Her pain is not pleuritic.  We will also trial a GI cocktail as she has a history of GERD  Amount and/or Complexity of  Data Reviewed Labs: ordered.    Details: Troponin within normal limits Radiology: ordered.    Details: Chest x-ray clear ECG/medicine tests:  Decision-making details documented in ED Course.    Details: Normal sinus rhythm Discussion of management or test interpretation with external provider(s): Discussed with patient, her chest pain greatly improved with the lidocaine patch, I believe that her pain is likely musculoskeletal, will have her follow-up with her  PCP.  Heart score of 0.  We discussed return precautions and she voiced understanding.  Had minimal relief with the GI cocktail, thus unlikely GI related.  Is PERC negative  Risk OTC drugs. Prescription drug management.    Final Clinical Impression(s) / ED Diagnoses Final diagnoses:  Chest wall pain    Rx / DC Orders ED Discharge Orders          Ordered    lidocaine (HM LIDOCAINE PATCH) 4 %  Every 24 hours        03/03/23 2313              Hollan Philipp L, PA 03/03/23 2314    Rozelle [facility], DO 03/03/23 2338

## 2023-03-03 NOTE — ED Triage Notes (Signed)
POV from home, A&O x 4, gcs 15, amb to triage  Pt c/o chest pain that worsened today, intermittent symptoms since September of seizures, passing out, sob and chest pain, seeing [facility] for symptoms and sts today only chest pain is present.

## 2023-03-05 DIAGNOSIS — R55 Syncope and collapse: Secondary | ICD-10-CM | POA: Diagnosis not present

## 2023-03-10 NOTE — Progress Notes (Unsigned)
03/10/2023 Jenna Henry [id number] 05/01/1996  Referring provider: [name], MD Primary GI doctor: Dr. Barron Alvine  ASSESSMENT AND PLAN:    Patient Care Team: [name], MD as PCP - General (Family Medicine)  HISTORY OF PRESENT ILLNESS: 27 y.o. female with a past medical history of PCOS, herpes, hidradenitis and others listed below presents for evaluation of AB pain, constipation and nausea.   In the month of June patient's had 3 visits to the ER for nausea, vomiting and abdominal pain. 11/08/2020 CT abdomen pelvis without contrast  lower abdominal pain which showed small volume free fluid within the cul-de-sac likely physiological, no acute abdomen pelvic issues 11/06/2022 RUQ [place] no gallstones, no ductal dilation.  11/07/2022 had a fall on left side of body.  11/10/2022 patient went to the ER  Hgb 13, MCV 97, normal WBC and platelets, negative platelets, normal kidney and liver, normal urine. CT abdomen pelvis with contrast for acute abdominal pain showed no intra-abdominal pathology, mild hepatic steatosis normal gallbladder, normal pancreas, normal spleen, stomach unremarkable, no evidence of bowel wall thickening distention or inflammatory changes Patient given GI cocktail, famotidine and Carafate with some improvement of her symptoms.  12/09/2022 colonoscopy and EGD with Dr. Barron Alvine for rectal bleeding, tenesmus, abdominal pain Colonoscopy showed internal hemorrhoids otherwise unremarkable no specimens collected Endoscopy erythematous mucosa prepyloric region showed nonspecific gastritis negative H. Pylori  Patient presents for follow-up.  She states she has had stomach issues since at least 2017.  When she was younger was on miralax and improved with getting older, than last year of high school started to have GI issues.  She states she has AB pain with her entire AB, recently radiating to lower back. Associated with nausea but no vomiting.  She has GERD  with spicy foods, no dysphagia. One episode of dark/black stool, no pepto or iron.  In the last month or two she has had feeling of needing to have a BM but unable to have BM.  She was having BM daily, but then was 2-3 x a day for a while, small volume but felt she needed to have more Bm's, then now last BM was Sunday.  She has seen red/orange stool with BM for 2-3 days, has had mucus.  She has fatigue/weakness, but no fever, chills, SOB, CP.  Denies weight loss.  Carafate helped the most, hot showers or warm baths did not help.  Patient denies family history of colon cancer or other gastrointestinal malignancies. She denies blood thinner use.  She denies NSAID use.  She denies ETOH use, rare with holidays.  She reports tobacco use, vaping.  She reports drug use, marijuana daily or every other day for 8 years. .    She  reports that she has quit smoking. Her smoking use included cigarettes. She has never used smokeless tobacco. She reports current alcohol use of about 2.0 standard drinks of alcohol per week. She reports current drug use. Drug: Marijuana.  RELEVANT LABS AND IMAGING: CBC    Component Value Date/Time   WBC 7.1 03/03/2023 2025   RBC 3.96 03/03/2023 2025   HGB 12.5 03/03/2023 2025   HGB 15.2 02/22/2019 1429   HCT 37.9 03/03/2023 2025   HCT 45.1 02/22/2019 1429   PLT 235 03/03/2023 2025   PLT 260 02/22/2019 1429   MCV 95.7 03/03/2023 2025   MCV 93 02/22/2019 1429   MCH 31.6 03/03/2023 2025   MCHC 33.0 03/03/2023 2025   RDW 13.6 03/03/2023 2025  RDW 13.4 02/22/2019 1429   LYMPHSABS 2.5 03/02/2023 1533   MONOABS 0.3 03/02/2023 1533   EOSABS 0.2 03/02/2023 1533   BASOSABS 0.1 03/02/2023 1533   Recent Labs    06/25/22 1503 11/05/22 1212 11/10/22 2012 11/10/22 2119 03/02/23 1533 03/03/23 2025  HGB 13.9 13.6 13.0 13.9 14.2 12.5    CMP     Component Value Date/Time   NA 138 03/03/2023 2025   NA 141 09/24/2017 1023   K 3.6 03/03/2023 2025   CL 107  03/03/2023 2025   CO2 25 03/03/2023 2025   GLUCOSE 111 (H) 03/03/2023 2025   BUN 6 03/03/2023 2025   BUN 8 09/24/2017 1023   CREATININE 0.63 03/03/2023 2025   CALCIUM 10.3 03/03/2023 2025   PROT 7.0 03/02/2023 1533   PROT 7.2 09/24/2017 1023   ALBUMIN 4.2 03/02/2023 1533   ALBUMIN 4.4 09/24/2017 1023   AST 14 03/02/2023 1533   ALT 16 03/02/2023 1533   ALKPHOS 57 03/02/2023 1533   BILITOT 0.3 03/02/2023 1533   BILITOT 0.4 09/24/2017 1023   GFRNONAA >60 03/03/2023 2025   GFRAA >60 12/14/2019 1639      Latest Ref Rng & Units 03/02/2023    3:33 PM 11/10/2022    9:05 PM 11/05/2022   12:12 PM  Hepatic Function  Total Protein 6.0 - 8.3 g/dL 7.0  7.7  7.5   Albumin 3.5 - 5.2 g/dL 4.2  4.1  4.5   AST 0 - 37 U/L 14  23  15    ALT 0 - 35 U/L 16  21  17    Alk Phosphatase 39 - 117 U/L 57  51  54   Total Bilirubin 0.2 - 1.2 mg/dL 0.3  0.6  0.4       Current Medications:   Current Outpatient Medications (Endocrine & Metabolic):    metFORMIN (GLUCOPHAGE) 500 MG tablet, Take 1 tablet (500 mg total) by mouth 2 (two) times daily with a meal. (Patient not taking: Reported on 03/02/2023)   Current Outpatient Medications (Respiratory):    albuterol (VENTOLIN HFA) 108 (90 Base) MCG/ACT inhaler, Inhale 2 puffs into the lungs every 4 (four) hours as needed for wheezing or shortness of breath. (Must have office visit for refills)  Current Outpatient Medications (Analgesics):    naproxen (NAPROSYN) 500 MG tablet, Take 1 tablet (500 mg total) by mouth 2 (two) times daily.   Current Outpatient Medications (Other):    hydrocortisone (ANUSOL-HC) 2.5 % rectal cream, Place 1 Application rectally 2 (two) times daily. (Patient not taking: Reported on 03/02/2023)   hydrocortisone (ANUSOL-HC) 25 MG suppository, Place 1 suppository (25 mg total) rectally 2 (two) times daily. (Patient not taking: Reported on 03/02/2023)   hyoscyamine (LEVBID) 0.375 MG 12 hr tablet, Take 1 tablet (0.375 mg total) by mouth 2 (two)  times daily. (Patient not taking: Reported on 03/02/2023)   lidocaine (HM LIDOCAINE PATCH) 4 %, Place 1 patch onto the skin daily.   ondansetron (ZOFRAN) 4 MG tablet, Take 1 tablet (4 mg total) by mouth every 8 (eight) hours as needed for nausea or vomiting. (Patient not taking: Reported on 03/02/2023)   pantoprazole (PROTONIX) 40 MG tablet, Take 1 tablet (40 mg total) by mouth daily. (Patient not taking: Reported on 03/02/2023)   valACYclovir (VALTREX) 1000 MG tablet, Take 1 tablet (1,000 mg total) by mouth 2 (two) times daily.  Medical History:  Past Medical History:  Diagnosis Date   Anxiety    Asthma    Depression  Herpes    PCOS (polycystic ovarian syndrome)    Polycystic ovaries    Allergies:  Allergies  Allergen Reactions   Ortho Tri-Cyclen [Norgestimate-Eth Estradiol] Hives, Itching, Rash and Other (See Comments)    "Caused burning all over"     Surgical History:  She  has a past surgical history that includes Cystectomy and Ankle reconstruction (Right, 10/28/2021). Family History:  Her family history includes Anxiety disorder in her mother; Arthritis in her mother; COPD in her maternal grandfather; Depression in her mother; Diabetes in her maternal grandfather; Heart attack in her maternal grandfather; Heart disease in her maternal grandfather; Heart murmur in her father; Hyperlipidemia in her maternal grandmother; Hypertension in her maternal grandfather and mother.  REVIEW OF SYSTEMS  : All other systems reviewed and negative except where noted in the History of Present Illness.  PHYSICAL EXAM: LMP 02/10/2023 (Exact Date)  General Appearance: Well nourished, in no apparent distress. Head:   Normocephalic and atraumatic. Eyes:  sclerae anicteric,conjunctive pink  Respiratory: Respiratory effort normal, BS equal bilaterally without rales, rhonchi, wheezing. Cardio: RRR with no MRGs. Peripheral pulses intact.  Abdomen: Soft,  Obese ,active bowel sounds. mild tenderness in  the epigastrium and in the lower abdomen. Without guarding and Without rebound. No masses. Rectal: Not evaluated Musculoskeletal: Full ROM, Normal gait. Without edema. Skin:  Dry and intact without significant lesions or rashes Neuro: Alert and  oriented x4;  No focal deficits. Psych:  Cooperative. Normal mood and affect.    Doree Albee, PA-C 9:59 AM

## 2023-03-11 ENCOUNTER — Encounter: Payer: Self-pay | Admitting: Physician Assistant

## 2023-03-11 ENCOUNTER — Ambulatory Visit: Payer: Medicaid Other | Admitting: Physician Assistant

## 2023-03-11 VITALS — BP 108/64 | HR 70 | Ht 65.0 in | Wt 181.0 lb

## 2023-03-11 DIAGNOSIS — K760 Fatty (change of) liver, not elsewhere classified: Secondary | ICD-10-CM

## 2023-03-11 DIAGNOSIS — K581 Irritable bowel syndrome with constipation: Secondary | ICD-10-CM | POA: Diagnosis not present

## 2023-03-11 DIAGNOSIS — R1084 Generalized abdominal pain: Secondary | ICD-10-CM | POA: Diagnosis not present

## 2023-03-11 DIAGNOSIS — R110 Nausea: Secondary | ICD-10-CM | POA: Diagnosis not present

## 2023-03-11 DIAGNOSIS — F1290 Cannabis use, unspecified, uncomplicated: Secondary | ICD-10-CM

## 2023-03-11 NOTE — Patient Instructions (Addendum)
Linzess 145 mcg daily *IBS-C patients may begin to experience relief from belly pain and overall abdominal symptoms (pain, discomfort, and bloating) in about 1 week,  with symptoms typically improving over 12 weeks.  Take at least 30 minutes before the first meal of the day on an empty stomach You can have a loose stool if you eat a high-fat breakfast. Give it at least 7 days, may have more bowel movements during that time.   The diarrhea should go away and you should start having normal, complete, full bowel movements.  It may be helpful to start treatment when you can be near the comfort of your own bathroom, such as a weekend.  After you are out we can send in a prescription if you did well, there is a prescription card  If the hemorrhoid suppository sent in is too expensive you can do this over the counter trick.  Apply a pea size amount of generic prescription Anusol HC cream that has been sent into your pharmacy to the tip of an over the counter PrepH suppository and insert rectally once every night for at least 7 nights.    First do a trial off milk/lactose products if you use them.  Add fiber like benefiber or citracel once a day Increase activity Can do trial of IBGard which is over the counter for AB pain- Take 1-2 capsules once a day for maintence or twice a day during a flare Please try to decrease stress. consider talking with PCP about anti anxiety medication or try head space app for meditation. if any worsening symptoms like blood in stool, weight loss, please call the office     FODMAP stands for fermentable oligo-, di-, mono-saccharides and polyols (1). These are the scientific terms used to classify groups of carbs that are difficult for our body to digest and that are notorious for triggering digestive symptoms like bloating, gas, loose stools and stomach pain.   You can try low FODMAP diet  - start with eliminating just one column at a time that you feel may be a  trigger for you. - the table at the very bottom contains foods that are low in FODMAPs   Sometimes trying to eliminate the FODMAP's from your diet is difficult or tricky, if you are stuggling with trying to do the elimination diet you can try an enzyme.  There is a food enzymes that you sprinkle in or on your food that helps break down the FODMAP. You can read more about the enzyme by going to this site: https://fodzyme.com/  ___________________________  Cannabinoid Hyperemesis Syndrome  What is cannabinoid hyperemesis syndrome?  Cannabinoid hyperemesis syndrome (CHS) is a condition that leads to repeated and severe bouts of vomiting. It is rare and only occurs in daily long-term users of marijuana.  Marijuana has several active substances. These include THC and related chemicals. These substances bind to molecules found in the brain. That causes the drug "high" and other effects that users feel.  Your digestive tract also has a number of molecules that bind to [facility] and related substances. So marijuana also affects the digestive tract. For example, the drug can change the time it takes the stomach to empty. It also affects the esophageal sphincter. That's the tight band of muscle that opens and closes to let food from the esophagus into the stomach. Long-term marijuana use can change the way the affected molecules respond and lead to the symptoms of CHS.  Marijuana is the most widely used  recreational drug in the [place] Young adults are the most frequent users. A small number of these people develop CHS. It often only happens in people who have regularly used marijuana. Often CHS affects those who use the drug at least once a day.  What causes cannabinoid hyperemesis syndrome?  Marijuana has very complex effects on the body. Experts are still trying to learn exactly how it causes CHS in some people.  In the brain, marijuana often has the opposite effect of CHS. It helps prevent nausea and  vomiting. The drug is also good at stopping such symptoms in people having chemotherapy.  But in the digestive tract, marijuana seems to have the opposite effect. It actually makes you more likely to have nausea and vomiting. With the first use of marijuana, the signals from the brain may be more important. That may lead to anti-nausea effects at first. But with repeated use of marijuana, certain receptors in the brain may stop responding to the drug in the same way. That may cause the repeated bouts of vomiting found in people with CHS.  It still isn't clear why some heavy marijuana users get the syndrome, but others don't.  What are the symptoms of cannabinoid hyperemesis syndrome?  People with CHS suffer from repeated bouts of vomiting. In between these episodes are times without any symptoms. Healthcare providers often divide these symptoms into 3 stages: the prodromal phase, the hyperemetic phase, and the recovery phase.  Prodromal phase. During this phase, the main symptoms are often early morning nausea and belly (abdominal) pain. Some people also develop a fear of vomiting. Most people keep normal eating patterns during this time. Some people use more marijuana because they think it will help stop the nausea. This phase may last for months or years.  Hyperemetic phase. Symptoms during this time may include:  Ongoing nausea  Repeated episodes of vomiting  Belly pain  Decreased food intake and weight loss  Symptoms of fluid loss (dehydration)  During this phase, vomiting is often intense and overwhelming. Many people take a lot of hot showers during the day. They find that doing so eases their nausea. (That may be because of how the hot temperature affects a part of the brain called the hypothalamus. This part of the brain effects both temperature regulation and vomiting.) People often first seek medical care during this phase.  The hyperemetic phase may continue until the person  completely stops using marijuana. Then the recovery phase starts.  Recovery phase. During this time, symptoms go away. Normal eating is possible again. This phase can last days or months. Symptoms often come back if the person tries marijuana again.  How is cannabinoid hyperemesis syndrome diagnosed?  Many health problems can cause repeated vomiting. To make a diagnosis, your healthcare provider will ask you about your symptoms and your past health. He or she will also do a physical exam, including an exam of your belly. Often this diagnosis can be made from history alone, however.  Your healthcare provider may also need more tests to rule out other causes of the vomiting.  CHS was only recently discovered. So some healthcare providers may not know about it. As a result, they may not spot it for many years. They often confuse CHS with cyclical vomiting disorder. That is a health problem that causes similar symptoms. A specialist trained in diseases of the digestive tract (gastroenterologist) might make the diagnosis.  You may have CHS if you have all of these:  Long-term  weekly and daily marijuana use  Belly pain  Severe, repeated nausea and vomiting  You feel better after taking a hot shower  There is no single test that confirms this diagnosis. Only improvement after quitting marijuana confirms the diagnosis.  How is cannabinoid hyperemesis syndrome treated?  To fully get better, you need to stop using marijuana all together. If you stop using marijuana, your symptoms should not come back.  Symptoms often ease after a day or 2 unless marijuana is used before this time.  In a small sample of people with CHS, rubbing capsaicin cream on the belly helped decrease pain and nausea. The chemicals in the cream have the same effect as a hot shower.  Quitting marijuana may lead to other health benefits, such as:  Better lung function  Improved memory and thinking skills  Better  sleep  Key points about cannabinoid hyperemesis syndrome  CHS is a condition that leads to repeated and severe bouts of vomiting. It results from long-term use of marijuana.  Most people self-treat using hot showers to help reduce their symptoms.  Some people with CHS may not be diagnosed for several years. Admitting to your healthcare provider that you use marijuana daily can speed up the diagnosis.  Symptoms start to go away within a day or 2 after stopping marijuana use.  Symptoms can come back if you use marijuana again  Avoid spicy and acidic foods Avoid fatty foods Limit your intake of coffee, tea, alcohol, and carbonated drinks Work to maintain a healthy weight Keep the head of the bed elevated at least 3 inches with blocks or a wedge pillow if you are having any nighttime symptoms Stay upright for 2 hours after eating Avoid meals and snacks three to four hours before bedtime Stop smoking  _______________________________________________________  If your blood pressure at your visit was 140/90 or greater, please contact your primary care physician to follow up on this.  _______________________________________________________  If you are age 45 or older, your body mass index should be between 23-30. Your Body mass index is 30.12 kg/m. If this is out of the aforementioned range listed, please consider follow up with your Primary Care Provider.  If you are age 31 or younger, your body mass index should be between 19-25. Your Body mass index is 30.12 kg/m. If this is out of the aformentioned range listed, please consider follow up with your Primary Care Provider.   ________________________________________________________  The [facility] providers would like to encourage you to use [facility] to communicate with providers for non-urgent requests or questions.  Due to long hold times on the telephone, sending your provider a message by [facility] may be a faster and more efficient way  to get a response.  Please allow 48 business hours for a response.  Please remember that this is for non-urgent requests.  _______________________________________________________ It was a pleasure to see you today!  Thank you for trusting me with your gastrointestinal care!

## 2023-03-24 NOTE — Progress Notes (Signed)
Agree with the assessment and plan as outlined by Amanda Collier, PA-C. ? ?Corgan Mormile, DO, FACG ? ?

## 2023-04-02 ENCOUNTER — Other Ambulatory Visit: Payer: Self-pay | Admitting: Medical Genetics

## 2023-04-02 ENCOUNTER — Other Ambulatory Visit: Payer: Self-pay | Admitting: Internal Medicine

## 2023-04-02 DIAGNOSIS — R0789 Other chest pain: Secondary | ICD-10-CM

## 2023-04-02 DIAGNOSIS — R55 Syncope and collapse: Secondary | ICD-10-CM

## 2023-04-02 DIAGNOSIS — R002 Palpitations: Secondary | ICD-10-CM

## 2023-04-02 DIAGNOSIS — Z006 Encounter for examination for normal comparison and control in clinical research program: Secondary | ICD-10-CM

## 2023-04-06 NOTE — Progress Notes (Signed)
Ok thanks 

## 2023-04-08 ENCOUNTER — Encounter: Payer: Self-pay | Admitting: Cardiovascular Disease

## 2023-04-08 ENCOUNTER — Ambulatory Visit: Payer: Medicaid Other | Attending: Cardiovascular Disease | Admitting: Cardiovascular Disease

## 2023-04-08 VITALS — BP 92/54 | HR 94 | Ht 65.0 in | Wt 181.2 lb

## 2023-04-08 DIAGNOSIS — R55 Syncope and collapse: Secondary | ICD-10-CM

## 2023-04-08 NOTE — Progress Notes (Signed)
Cardiology Office Note:  .   Date:  04/08/2023  ID:  Jenna Henry, DOB 1995/10/31, MRN [id number] PCP: Jenna Georges, MD  [facility] Providers Cardiologist:  [name], MD {   History of Present Illness: .   Jenna Henry is a 27 y.o. female with history of PCOS, anxiety, who presents for the evaluation of syncope at the request of Jenna Needle [name], MD.  Discussed the use of AI scribe software for clinical note transcription with the patient, who gave verbal consent to proceed.  History of Present Illness   Jenna Henry, a 27 year old with a history of PCOS, anxiety, and no known seizure disorder, presents for evaluation of syncope. She experienced three episodes in September. The first episode occurred while she was a passenger in a car, where she felt like she was dozing off and then began shaking. She did not seek medical attention for this episode. The second episode occurred a week later when she woke up on the bathroom floor after feeling herself shaking. She did not seek medical attention for this episode either. The third episode occurred when she fell while trying to get out of bed. After this episode, she sought medical attention. Negative monitor by PCP. She denies any chest pain or trouble breathing before these episodes but reports difficulty catching her breath afterward. She also reports vomiting during the first episode. She denies any history of similar episodes in the past. She did not urinate or defecate on herself. She admits to poor oral intake, drinking only one bottle of water per day, frequently skipping meals, and daily marijuana use. Medical history not significant for seizures. No sudden cardiac death in the family.       TSH 1.41  HGB 12.5 Zio 04/01/2023 -> Brief NSVT, no significant arrhythmias      Problem List PCOS Anxiety     ROS: All other ROS reviewed and negative. Pertinent positives noted in the HPI.     Studies Reviewed: Marland Kitchen        Physical Exam:   VS:  BP (!) 92/54 (BP Location: Left Arm, Patient Position: Sitting, Cuff Size: Normal)   Pulse 94   Ht 5\' 5"  (1.651 m)   Wt 181 lb 3.2 oz (82.2 kg)   SpO2 97%   BMI 30.15 kg/m    Wt Readings from Last 3 Encounters:  04/08/23 181 lb 3.2 oz (82.2 kg)  03/11/23 181 lb (82.1 kg)  03/03/23 179 lb 14.3 oz (81.6 kg)    GEN: Well nourished, well developed in no acute distress NECK: No JVD; No carotid bruits CARDIAC: RRR, no murmurs, rubs, gallops RESPIRATORY:  Clear to auscultation without rales, wheezing or rhonchi  ABDOMEN: Soft, non-tender, non-distended EXTREMITIES:  No edema; No deformity  ASSESSMENT AND PLAN: .   Assessment and Plan    Syncope Three episodes in September 2024, with possible seizure-like activity during the first episode. No history of seizures. Normal TSH, hemoglobin, heart monitor, and EKG. Poor hydration and frequent marijuana use could be contributing factors. Suspect vasovagal episodes versus seizures. Doubt arrhythmia.  -Order echocardiogram. -Refer to neurology for evaluation of possible seizures. -Increase oral hydration to 6-8 glasses of water per day. -Advise to cease marijuana use. -Follow-up in 3 months.  Driving Restrictions Due to syncope episodes, advised against driving until further evaluation and clearance. -Enforce no driving until cleared by neurology and cardiology. [place] law mandates 6 months, especially if seizure.  Follow-up: Return in about 3 months (around 07/09/2023).  Signed, Jenna Henry. Jenna Lipps, MD, [facility]  [facility]  [address], [address] [place], [place] [address] [phone]  4:12 PM

## 2023-04-08 NOTE — Patient Instructions (Addendum)
Medication Instructions:  NO CHANGES     *If you need a refill on your cardiac medications before your next appointment, please call your pharmacy*   Lab Work: NONE    If you have labs (blood work) drawn today and your tests are completely normal, you will receive your results only by: MyChart Message (if you have MyChart) OR A paper copy in the mail If you have any lab test that is abnormal or we need to change your treatment, we will call you to review the results.   Testing/Procedures: Echo will be scheduled at [address].  Your physician has requested that you have an echocardiogram. Echocardiography is a painless test that uses sound waves to create images of your heart. It provides your doctor with information about the size and shape of your heart and how well your heart's chambers and valves are working. This procedure takes approximately one hour. There are no restrictions for this procedure. Please do NOT wear cologne, perfume, aftershave, or lotions (deodorant is allowed). Please arrive 15 minutes prior to your appointment time.     Follow-Up: At [facility], you and your health needs are our priority.  As part of our continuing mission to provide you with exceptional heart care, we have created designated Provider Care Teams.  These Care Teams include your primary Cardiologist (physician) and Advanced Practice Providers (APPs -  Physician Assistants and Nurse Practitioners) who all work together to provide you with the care you need, when you need it.  We recommend signing up for the patient portal called "MyChart".  Sign up information is provided on this After Visit Summary.  MyChart is used to connect with patients for Virtual Visits (Telemedicine).  Patients are able to view lab/test results, encounter notes, upcoming appointments, etc.  Non-urgent messages can be sent to your provider as well.   To learn more about what you can do with MyChart, go to  [email or web address].    Your next appointment:   3 month(s)  The format for your next appointment:   In Person  Provider:   [name], MD    Other Instructions Dr. Scharlene Gloss has referred you to [facility] for follow-up a evaluation.

## 2023-04-09 ENCOUNTER — Encounter: Payer: Self-pay | Admitting: Neurology

## 2023-04-21 ENCOUNTER — Ambulatory Visit (INDEPENDENT_AMBULATORY_CARE_PROVIDER_SITE_OTHER): Payer: Medicaid Other | Admitting: Family Medicine

## 2023-04-21 VITALS — BP 100/72 | HR 70 | Temp 98.3°F | Ht 65.0 in | Wt 186.8 lb

## 2023-04-21 DIAGNOSIS — L732 Hidradenitis suppurativa: Secondary | ICD-10-CM

## 2023-04-21 DIAGNOSIS — Z6830 Body mass index (BMI) 30.0-30.9, adult: Secondary | ICD-10-CM | POA: Diagnosis not present

## 2023-04-21 DIAGNOSIS — E66811 Obesity, class 1: Secondary | ICD-10-CM | POA: Diagnosis not present

## 2023-04-21 MED ORDER — WEGOVY 0.5 MG/0.5ML ~~LOC~~ SOAJ
0.5000 mg | SUBCUTANEOUS | 0 refills | Status: DC
Start: 2023-04-21 — End: 2023-06-19

## 2023-04-21 MED ORDER — SULFAMETHOXAZOLE-TRIMETHOPRIM 800-160 MG PO TABS
1.0000 | ORAL_TABLET | Freq: Two times a day (BID) | ORAL | 0 refills | Status: AC
Start: 2023-04-21 — End: 2023-04-28

## 2023-04-21 MED ORDER — WEGOVY 1 MG/0.5ML ~~LOC~~ SOAJ
1.0000 mg | SUBCUTANEOUS | 0 refills | Status: DC
Start: 2023-04-21 — End: 2023-06-19

## 2023-04-21 MED ORDER — WEGOVY 0.25 MG/0.5ML ~~LOC~~ SOAJ: 0.2500 mg | SUBCUTANEOUS | 0 refills | Status: DC

## 2023-04-21 MED ORDER — METFORMIN HCL 500 MG PO TABS
500.0000 mg | ORAL_TABLET | Freq: Two times a day (BID) | ORAL | 3 refills | Status: DC
Start: 2023-04-21 — End: 2023-07-22

## 2023-04-21 NOTE — Progress Notes (Signed)
Established Patient Office Visit  Subjective   Patient ID: Jenna Henry, female    DOB: 1996-01-01  Age: 27 y.o. MRN: [id number]  Chief Complaint  Patient presents with   Medical Management of Chronic Issues    Pt is here for follow up today  -- pt had her evaluation by cardiology -- had a 13 beat run of possible NSVT -- she is scheduled for ECHO on 12/16. No further syncopal episodes since she saw the cardiologist. Was referred to neurology for evaluation to rule out seizure disorder-- pt reports that she cannot get an appointment until March.   Pt saw the GI doctor, was diagnosed with IBS, was given samples of linzess to use as needed -- pt states these are helping with her diarrhea symptoms  HS-- pt states she continues to have problems with her HS. States that the appearance is about the same, states that she is having more than one openings that are draining where before it was just one. States that she did take the metformin for about 6 months, states that initially the HS improved and she thought it was getting better, but then it came back.   Obesity-- pt reports she has been trying to lose weight in the past. States she has been the same weight since she was 18, states she has tried dieting and exercise in the past without any improvement in her weight. Has never tried medications to lose weight in the past.     Current Outpatient Medications  Medication Instructions   albuterol (VENTOLIN HFA) 108 (90 Base) MCG/ACT inhaler Inhale 2 puffs into the lungs every 4 (four) hours as needed for wheezing or shortness of breath. (Must have office visit for refills)   hyoscyamine (LEVBID) 0.375 mg, Oral, As needed   metFORMIN (GLUCOPHAGE) 500 mg, Oral, 2 times daily with meals   sulfamethoxazole-trimethoprim (BACTRIM DS) 800-160 MG tablet 1 tablet, Oral, 2 times daily   valACYclovir (VALTREX) 1,000 mg, Oral, 2 times daily   Wegovy 0.25 mg, Subcutaneous, Weekly   Wegovy 0.5 mg,  Subcutaneous, Weekly   Wegovy 1 mg, Subcutaneous, Weekly    Patient Active Problem List   Diagnosis Date Noted   Obesity (BMI 30.0-34.9) 04/26/2023   Fatty liver 11/17/2022   Hydradenitis 06/23/2022   PCOS (polycystic ovarian syndrome) 06/23/2022   Mild intermittent asthma 06/23/2022   Sprain of anterior talofibular ligament of right ankle    Syndesmotic disruption of right ankle    Head trauma, sequela 06/03/2021   New daily persistent headache 06/03/2021   Dizziness and giddiness 06/03/2021   MDD (major depressive disorder) 10/10/2018   Hypokalemia 10/09/2018   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     Objective:     BP 100/72 (BP Location: Left Arm, Patient Position: Sitting, Cuff Size: Normal)   Pulse 70   Temp 98.3 F (36.8 C) (Oral)   Ht 5\' 5"  (1.651 m)   Wt 186 lb 12.8 oz (84.7 kg)   LMP 04/14/2023 (Exact Date)   SpO2 99%   BMI 31.09 kg/m    Physical Exam Vitals reviewed.  Constitutional:      Appearance: Normal appearance. She is obese.  Cardiovascular:     Rate and Rhythm: Normal rate and regular rhythm.  Pulses: Normal pulses.     Heart sounds: Normal heart sounds.  Pulmonary:     Effort: Pulmonary effort is normal.     Breath sounds: Normal breath sounds. No wheezing.  Musculoskeletal:     Right lower leg: No edema.     Left lower leg: No edema.  Skin:    Comments: Right axilla there are 2 openings with tender nodules, there is a smal amount of purulent drainage expressed from one of the nodules.   Neurological:     Mental Status: She is alert and oriented to person, place, and time. Mental status is at baseline.      No results found for any visits on 04/21/23.    The ASCVD Risk score (Arnett DK, et al.,  2019) failed to calculate for the following reasons:   The 2019 ASCVD risk score is only valid for ages 74 to 52    Assessment & Plan:  Hydradenitis Assessment & Plan: Current active out break in the right axilla, will treat with 7 days of bactrim, will continue her metformin since it seemed to be reducing her outbreaks. I also reinforced the topical treatments I had recommended to her before (pt was non adherent to the topical treatment). Will send to general surgery for recommendations on possible surgery.  Orders: -     metFORMIN HCl; Take 1 tablet (500 mg total) by mouth 2 (two) times daily with a meal.  Dispense: 180 tablet; Refill: 3 -     Sulfamethoxazole-Trimethoprim; Take 1 tablet by mouth 2 (two) times daily for 7 days.  Dispense: 14 tablet; Refill: 0 -     Ambulatory referral to General Surgery  Obesity (BMI 30.0-34.9) Assessment & Plan: I have had an extensive 30 minute conversation today with the patient about healthy eating habits, exercise, calorie and carb goals for sustainable and successful weight loss. I gave the patient caloric and protein daily intake values as well as described the importance of increasing fiber and water intake. I discussed weight loss medications that could be used in the treatment of this patient. Handouts on low carb eating were given to the patient.    Patient would be an excellent candidate for wegovy weight loss medication, she has failed diet and exericse in the past. I will be putting her on a calorie/ carb restricted diet. RTC in 3 months for weight check.   Orders: -     Wegovy; Inject 0.25 mg into the skin once a week. (Patient not taking: Reported on 04/23/2023)  Dispense: 2 mL; Refill: 0 -     Wegovy; Inject 0.5 mg into the skin once a week. (Patient not taking: Reported on 04/23/2023)  Dispense: 2 mL; Refill: 0 -     Wegovy; Inject 1 mg into the skin once a week. (Patient not taking: Reported on 04/23/2023)  Dispense: 2 mL; Refill: 0      Return in about 3 months (around 07/22/2023) for weight loss.    [name], MD

## 2023-04-21 NOTE — Patient Instructions (Addendum)
PanOxyl 10% face wash -- daily  Benzoyl peroxide 5% cream --twice daily.   Total calories: 1650 per day  Total carbs: 70-80 grams per day  Total protein:  increase to at least 90-100 grams per day
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
## 2023-04-23 ENCOUNTER — Encounter: Payer: Self-pay | Admitting: Neurology

## 2023-04-23 ENCOUNTER — Ambulatory Visit (INDEPENDENT_AMBULATORY_CARE_PROVIDER_SITE_OTHER): Payer: Medicaid Other | Admitting: Neurology

## 2023-04-23 VITALS — BP 105/69 | HR 78 | Ht 68.0 in | Wt 185.0 lb

## 2023-04-23 DIAGNOSIS — R569 Unspecified convulsions: Secondary | ICD-10-CM

## 2023-04-23 NOTE — Patient Instructions (Addendum)
1. Will check MRI of brain with and without contrast with seizure protocol , AND 1 hour sleep deprived EEG.   2. Avoid activities in which a seizure would cause danger to yourself or to others.  Don't operate dangerous machinery, swim alone, or climb in high or dangerous places, such as on ladders, roofs, or girders.  Do not drive unless your doctor says you may.  3. If you have any warning that you may have a seizure, lay down in a safe place where you can't hurt yourself.    4.  No driving for 6 months from last seizure, as per [employer]   Please refer to the following link on the Epilepsy Foundation of America's website for more information: http://www.epilepsyfoundation.org/answerplace/Social/driving/drivingu.cfm   5.  Maintain good sleep hygiene.  6.  Notify your neurology if you are planning pregnancy or if you become pregnant.  7.  Contact your doctor if you have any problems that may be related to the medicine you are taking.  8.  Call 911 and bring the patient back to the ED if:        A.  The seizure lasts longer than 5 minutes.       B.  The patient doesn't awaken shortly after the seizure  C.  The patient has new problems such as difficulty seeing, speaking or moving  D.  The patient was injured during the seizure  E.  The patient has a temperature over 102 F (39C)  F.  The patient vomited and now is having trouble breathing  MRI Exams  Please notify your physician if you have allergies to MRI contrast.  MRI cannot be performed on patients with cardiac pacemakers, some cardiac valves and stents, ear implants, neuro-stimulators and some aneurysm clips.  Bring any information peretaining to stents or implants to your exam.  MRI Brain or Orbit: No eye makeup preferred.  MRI of Abdomen and/or MRCP: No food or drink 4 hours prior.  If you require medication for claustrophobia or anxiety, please bring your medication and a driver with you to the appointment.  Upon arrival, tell the receptionist that you have brought your medication. Ask when to take it.   Address: [address], [address]  [place], [place]  Phone: [phone]

## 2023-04-26 ENCOUNTER — Other Ambulatory Visit (HOSPITAL_COMMUNITY)
Admission: RE | Admit: 2023-04-26 | Discharge: 2023-04-26 | Disposition: A | Discharge status: Home / Self Care | Payer: Medicaid Other | Source: Ambulatory Visit | Attending: Medical Genetics | Admitting: Medical Genetics

## 2023-04-26 DIAGNOSIS — E66811 Obesity, class 1: Secondary | ICD-10-CM | POA: Insufficient documentation

## 2023-04-26 DIAGNOSIS — Z006 Encounter for examination for normal comparison and control in clinical research program: Secondary | ICD-10-CM | POA: Insufficient documentation

## 2023-04-26 NOTE — Assessment & Plan Note (Signed)
I have had an extensive 30 minute conversation today with the patient about healthy eating habits, exercise, calorie and carb goals for sustainable and successful weight loss. I gave the patient caloric and protein daily intake values as well as described the importance of increasing fiber and water intake. I discussed weight loss medications that could be used in the treatment of this patient. Handouts on low carb eating were given to the patient.    Patient would be an excellent candidate for wegovy weight loss medication, she has failed diet and exericse in the past. I will be putting her on a calorie/ carb restricted diet. RTC in 3 months for weight check.

## 2023-04-26 NOTE — Assessment & Plan Note (Signed)
Current active out break in the right axilla, will treat with 7 days of bactrim, will continue her metformin since it seemed to be reducing her outbreaks. I also reinforced the topical treatments I had recommended to her before (pt was non adherent to the topical treatment). Will send to general surgery for recommendations on possible surgery.

## 2023-04-27 ENCOUNTER — Ambulatory Visit (INDEPENDENT_AMBULATORY_CARE_PROVIDER_SITE_OTHER): Payer: Medicaid Other | Admitting: Neurology

## 2023-04-27 ENCOUNTER — Telehealth: Payer: Self-pay

## 2023-04-27 DIAGNOSIS — R569 Unspecified convulsions: Secondary | ICD-10-CM | POA: Diagnosis not present

## 2023-04-27 NOTE — Telephone Encounter (Signed)
*  Primary  Pharmacy Patient Advocate Encounter   Received notification from CoverMyMeds that prior authorization for Wegovy 1MG /0.5ML auto-injectors  is required/requested.   Insurance verification completed.   The patient is insured through [employer] .   Per test claim: PA required; PA submitted to above mentioned insurance via CoverMyMeds Key/confirmation #/EOC [id number] Status is pending

## 2023-04-27 NOTE — Telephone Encounter (Signed)
*  Primary  Pharmacy Patient Advocate Encounter   Received notification from CoverMyMeds that prior authorization for Wegovy 0.5MG /0.5ML auto-injectors  is required/requested.   Insurance verification completed.   The patient is insured through [facility] .   Per test claim: PA required; PA submitted to above mentioned insurance via CoverMyMeds Key/confirmation #/EOC [id number] Status is pending

## 2023-04-27 NOTE — Progress Notes (Unsigned)
EEG complete - results pending 

## 2023-04-28 ENCOUNTER — Telehealth: Payer: Self-pay | Admitting: Neurology

## 2023-04-28 ENCOUNTER — Other Ambulatory Visit (HOSPITAL_COMMUNITY): Payer: Self-pay

## 2023-04-28 NOTE — Telephone Encounter (Signed)
Pharmacy Patient Advocate Encounter  Received notification from [facility] Medicaid that Prior Authorization for Wegovy 0.5MG /0.5ML auto-injectors  has been APPROVED from 04/27/23 to 10/24/23. Ran test claim, Copay is $4. This test claim was processed through [facility]- copay amounts may vary at other pharmacies due to pharmacy/plan contracts, or as the patient moves through the different stages of their insurance plan.   PA #/Case ID/Reference #: [id number]

## 2023-04-28 NOTE — Telephone Encounter (Signed)
PA request has been Approved. For additional info see Pharmacy Prior Auth telephone encounter from 04/27/23 [facility] 0.5MG ).

## 2023-04-28 NOTE — Telephone Encounter (Signed)
Spoke with Yolotzin at [employer] and informed her of the approval as below.  She stated she informed the patient the Rx is on back order and will be contacted when this available for pick up.

## 2023-04-28 NOTE — Telephone Encounter (Signed)
Patient advised of note below

## 2023-04-28 NOTE — Progress Notes (Signed)
ELECTROENCEPHALOGRAM REPORT  Date of Study: 04/27/2023  Patient's Name: Jenna Henry MRN: [id number] Date of Birth: 1996/01/22   Clinical History: 27 year old female presenting with syncope and at least one with seizure-like activity.  Medications: Current Outpatient Medications on File Prior to Visit  Medication Sig Dispense Refill   albuterol (VENTOLIN HFA) 108 (90 Base) MCG/ACT inhaler Inhale 2 puffs into the lungs every 4 (four) hours as needed for wheezing or shortness of breath. (Must have office visit for refills) 6.7 g 2   hyoscyamine (LEVBID) 0.375 MG 12 hr tablet Take 0.375 mg by mouth as needed.     metFORMIN (GLUCOPHAGE) 500 MG tablet Take 1 tablet (500 mg total) by mouth 2 (two) times daily with a meal. 180 tablet 3   Semaglutide-Weight Management (WEGOVY) 0.25 MG/0.5ML SOAJ Inject 0.25 mg into the skin once a week. (Patient not taking: Reported on 04/23/2023) 2 mL 0   Semaglutide-Weight Management (WEGOVY) 0.5 MG/0.5ML SOAJ Inject 0.5 mg into the skin once a week. (Patient not taking: Reported on 04/23/2023) 2 mL 0   Semaglutide-Weight Management (WEGOVY) 1 MG/0.5ML SOAJ Inject 1 mg into the skin once a week. (Patient not taking: Reported on 04/23/2023) 2 mL 0   sulfamethoxazole-trimethoprim (BACTRIM DS) 800-160 MG tablet Take 1 tablet by mouth 2 (two) times daily for 7 days. 14 tablet 0   valACYclovir (VALTREX) 1000 MG tablet Take 1 tablet (1,000 mg total) by mouth 2 (two) times daily. 14 tablet 2   No current facility-administered medications on file prior to visit.    Technical Summary: A multichannel digital EEG recording measured by the international 10-20 system with electrodes applied with paste and impedances below 5000 ohms performed in our laboratory with EKG monitoring in an awake and asleep patient.  Hyperventilation and photic stimulation were  performed.  The digital EEG was referentially recorded, reformatted, and digitally filtered in a variety of bipolar and  referential montages for optimal display.    Description: The patient is awake and asleep during the recording.  During maximal wakefulness, there is a symmetric, medium voltage 11 Hz posterior dominant rhythm that attenuates with eye opening.  The record is symmetric.  During drowsiness and sleep, there is an increase in theta slowing of the background.  Vertex waves and symmetric sleep spindles were seen.  Hyperventilation and photic stimulation did not elicit any abnormalities.  There were no epileptiform discharges or electrographic seizures seen.    EKG lead was unremarkable.  Impression: This awake and asleep EEG is normal.    Clinical Correlation: A normal EEG does not exclude a clinical diagnosis of epilepsy.  If further clinical questions remain, prolonged EEG may be helpful.  Clinical correlation is advised.   Shon Millet, DO

## 2023-04-28 NOTE — Telephone Encounter (Signed)
EEG is normal.  I would not start an anti-seizure medication at this time.  As per [place] law, she is still not to drive for 6 months from last episode of loss of consciousness

## 2023-05-04 LAB — GENECONNECT MOLECULAR SCREEN: Genetic Analysis Overall Interpretation: NEGATIVE

## 2023-05-17 ENCOUNTER — Ambulatory Visit (HOSPITAL_COMMUNITY): Payer: Self-pay

## 2023-06-16 ENCOUNTER — Ambulatory Visit (HOSPITAL_COMMUNITY)
Admission: EM | Admit: 2023-06-16 | Discharge: 2023-06-16 | Disposition: A | Discharge status: Home / Self Care | Payer: Self-pay | Attending: Emergency Medicine | Admitting: Emergency Medicine

## 2023-06-16 ENCOUNTER — Other Ambulatory Visit: Payer: Self-pay

## 2023-06-16 ENCOUNTER — Encounter (HOSPITAL_COMMUNITY): Payer: Self-pay | Admitting: Emergency Medicine

## 2023-06-16 DIAGNOSIS — Z3201 Encounter for pregnancy test, result positive: Secondary | ICD-10-CM

## 2023-06-16 DIAGNOSIS — J0100 Acute maxillary sinusitis, unspecified: Secondary | ICD-10-CM

## 2023-06-16 LAB — POCT URINALYSIS DIP (MANUAL ENTRY)
Bilirubin, UA: NEGATIVE
Blood, UA: NEGATIVE
Glucose, UA: NEGATIVE mg/dL
Ketones, POC UA: NEGATIVE mg/dL
Leukocytes, UA: NEGATIVE
Nitrite, UA: NEGATIVE
Protein Ur, POC: NEGATIVE mg/dL
Spec Grav, UA: 1.02 (ref 1.010–1.025)
Urobilinogen, UA: 0.2 U/dL
pH, UA: 7 (ref 5.0–8.0)

## 2023-06-16 LAB — POCT URINE PREGNANCY: Preg Test, Ur: POSITIVE — AB

## 2023-06-16 MED ORDER — AMOXICILLIN 500 MG PO CAPS: 500.0000 mg | ORAL_CAPSULE | Freq: Two times a day (BID) | ORAL | 0 refills | Status: AC

## 2023-06-16 NOTE — ED Triage Notes (Signed)
 Onset December 20 of back aches, chills, runny nose, headache, and body aches.   Has had theraflu, daytime/night time liquid medicine and halls cough drops and no improvement

## 2023-06-16 NOTE — Discharge Instructions (Addendum)
 Your urine pregnancy test was positive.  Please follow-up with an OB/GYN for further evaluation with this.  I have sent in antibiotics for sinusitis.  Take these as prescribed and with food.  Do not take any ibuprofen , as this can be harmful in early pregnancy.  For any fever or bodyaches you can take 500 mg of Tylenol  every 6 hours.  Return to clinic for any new or urgent symptoms.

## 2023-06-16 NOTE — Progress Notes (Deleted)
 06/16/2023 Jenna Henry [id number] 09-25-1995  Referring provider: [name], MD Primary GI doctor: Dr. Barron Alvine  ASSESSMENT AND PLAN:   Irritable Bowel Syndrome (IBS) Chronic abdominal discomfort, bloating, and constipation. Previous workup including CT, ultrasound, colonoscopy, and endoscopy were unremarkable except for internal hemorrhoids and nonspecific gastric inflammation. Symptoms are suggestive of IBS -Start Linzess 145 samples to help with constipation and bloating. -Provide written information about IBS and potential dietary modifications. -Consider testing for small intestinal bacterial overgrowth if symptoms persist despite Linzess. -Instructed to quit smoking marijuana, can take up to 9-12 months of cessation.  Can do lidocaine patches for the pain such as salon pas patches.   Can do trial of amitriptyline for nausea if needed.  Handout given to the patient - follow up GYN  Internal Hemorrhoids Exacerbated during menstrual periods. Difficulty managing due to cost of prescribed suppository. -Advise on over-the-counter Preparation H suppository combined with prescribed cream as a cost-effective alternative. - can consider banding if continued symptoms  Seizures Recent onset of seizures, currently under investigation with heart monitor. Possible link to previous syncope episodes. -Continue workup with neurology and cardiology as directed by primary care provider.  Gastroesophageal Reflux Disease (GERD) Symptoms controlled with avoidance of spicy foods. -Continue dietary modifications to manage symptoms.  General Health Maintenance -Recommend follow-up with a gynecologist given menstrual-related nausea and potential overlap with bowel symptoms. -Schedule follow-up in 3 months to reassess symptoms and treatment efficacy.        Patient Care Team: [name], MD as PCP - General (Family Medicine) O'Neal, Ronnald Ramp, MD as PCP - Cardiology  (Cardiology) Drema Dallas, DO as Consulting Physician (Neurology)  HISTORY OF PRESENT ILLNESS: 28 y.o. female with a past medical history of PCOS, herpes, hidradenitis and others listed below presents for evaluation of AB pain, constipation and nausea.   In the month of June patient's had 3 visits to the ER for nausea, vomiting and abdominal pain. 11/08/2020 CT abdomen pelvis without contrast  lower abdominal pain which showed small volume free fluid within the cul-de-sac likely physiological, no acute abdomen pelvic issues 11/06/2022 RUQ [place] no gallstones, no ductal dilation.  11/07/2022 had a fall on left side of body.  11/10/2022 patient went to the ER  Hgb 13, MCV 97, normal WBC and platelets, negative platelets, normal kidney and liver, normal urine. CT abdomen pelvis with contrast for acute abdominal pain showed no intra-abdominal pathology, mild hepatic steatosis normal gallbladder, normal pancreas, normal spleen, stomach unremarkable, no evidence of bowel wall thickening distention or inflammatory changes Patient given GI cocktail, famotidine and Carafate with some improvement of her symptoms.  12/09/2022 colonoscopy and EGD with Dr. Barron Alvine for rectal bleeding, tenesmus, abdominal pain Colonoscopy showed internal hemorrhoids otherwise unremarkable no specimens collected Endoscopy erythematous mucosa prepyloric region showed nonspecific gastritis negative H. Pylori  Patient presents for follow-up.  Discussed the use of AI scribe software for clinical note transcription with the patient, who gave verbal consent to proceed.  The patient, with a history of internal hemorrhoids and nonspecific gastric inflammation, presents with ongoing abdominal bloating and constipation. They report that the bloating is their primary concern, as it causes discomfort and is noticeable to others. The bloating is associated with difficulty in bowel movements, which they attribute to constipation.  They  express concern about straining due to their internal hemorrhoids. They also report occasional nausea, primarily during their menstrual cycle, and heartburn with spicy foods. They have been managing their bowel movements with  over-the-counter MiraLAX, but are not currently taking it.  They also report recent seizures, for which they are undergoing evaluation with a heart monitor. They have a history of syncope, and suspect that an ankle injury last year may have been due to a seizure.   Patient denies family history of colon cancer or other gastrointestinal malignancies. She denies blood thinner use.  She denies NSAID use.  She denies ETOH use, rare with holidays.  She reports tobacco use, vaping.  She reports drug use, marijuana daily or every other day for 8 years. .    RELEVANT LABS AND IMAGING: CBC    Component Value Date/Time   WBC 7.1 03/03/2023 2025   RBC 3.96 03/03/2023 2025   HGB 12.5 03/03/2023 2025   HGB 15.2 02/22/2019 1429   HCT 37.9 03/03/2023 2025   HCT 45.1 02/22/2019 1429   PLT 235 03/03/2023 2025   PLT 260 02/22/2019 1429   MCV 95.7 03/03/2023 2025   MCV 93 02/22/2019 1429   MCH 31.6 03/03/2023 2025   MCHC 33.0 03/03/2023 2025   RDW 13.6 03/03/2023 2025   RDW 13.4 02/22/2019 1429   LYMPHSABS 2.5 03/02/2023 1533   MONOABS 0.3 03/02/2023 1533   EOSABS 0.2 03/02/2023 1533   BASOSABS 0.1 03/02/2023 1533   Recent Labs    06/25/22 1503 11/05/22 1212 11/10/22 2012 11/10/22 2119 03/02/23 1533 03/03/23 2025  HGB 13.9 13.6 13.0 13.9 14.2 12.5    CMP     Component Value Date/Time   NA 138 03/03/2023 2025   NA 141 09/24/2017 1023   K 3.6 03/03/2023 2025   CL 107 03/03/2023 2025   CO2 25 03/03/2023 2025   GLUCOSE 111 (H) 03/03/2023 2025   BUN 6 03/03/2023 2025   BUN 8 09/24/2017 1023   CREATININE 0.63 03/03/2023 2025   CALCIUM 10.3 03/03/2023 2025   PROT 7.0 03/02/2023 1533   PROT 7.2 09/24/2017 1023   ALBUMIN 4.2 03/02/2023 1533   ALBUMIN 4.4  09/24/2017 1023   AST 14 03/02/2023 1533   ALT 16 03/02/2023 1533   ALKPHOS 57 03/02/2023 1533   BILITOT 0.3 03/02/2023 1533   BILITOT 0.4 09/24/2017 1023   GFRNONAA >60 03/03/2023 2025   GFRAA >60 12/14/2019 1639      Latest Ref Rng & Units 03/02/2023    3:33 PM 11/10/2022    9:05 PM 11/05/2022   12:12 PM  Hepatic Function  Total Protein 6.0 - 8.3 g/dL 7.0  7.7  7.5   Albumin 3.5 - 5.2 g/dL 4.2  4.1  4.5   AST 0 - 37 U/L 14  23  15    ALT 0 - 35 U/L 16  21  17    Alk Phosphatase 39 - 117 U/L 57  51  54   Total Bilirubin 0.2 - 1.2 mg/dL 0.3  0.6  0.4       Current Medications:   Current Outpatient Medications (Endocrine & Metabolic):    metFORMIN (GLUCOPHAGE) 500 MG tablet, Take 1 tablet (500 mg total) by mouth 2 (two) times daily with a meal.   Current Outpatient Medications (Respiratory):    albuterol (VENTOLIN HFA) 108 (90 Base) MCG/ACT inhaler, Inhale 2 puffs into the lungs every 4 (four) hours as needed for wheezing or shortness of breath. (Must have office visit for refills)    Current Outpatient Medications (Other):    hyoscyamine (LEVBID) 0.375 MG 12 hr tablet, Take 0.375 mg by mouth as needed. (Patient not taking: Reported on 06/16/2023)  Semaglutide-Weight Management (WEGOVY) 0.25 MG/0.5ML SOAJ, Inject 0.25 mg into the skin once a week. (Patient not taking: Reported on 04/23/2023)   Semaglutide-Weight Management (WEGOVY) 0.5 MG/0.5ML SOAJ, Inject 0.5 mg into the skin once a week. (Patient not taking: Reported on 04/23/2023)   Semaglutide-Weight Management (WEGOVY) 1 MG/0.5ML SOAJ, Inject 1 mg into the skin once a week. (Patient not taking: Reported on 04/23/2023)   valACYclovir (VALTREX) 1000 MG tablet, Take 1 tablet (1,000 mg total) by mouth 2 (two) times daily.  Medical History:  Past Medical History:  Diagnosis Date   Anxiety    Asthma    Depression    Herpes    PCOS (polycystic ovarian syndrome)    Polycystic ovaries    Allergies:  Allergies  Allergen  Reactions   Ortho Tri-Cyclen [Norgestimate-Eth Estradiol] Hives, Itching, Rash and Other (See Comments)    "Caused burning all over"     Surgical History:  She  has a past surgical history that includes Cystectomy and Ankle reconstruction (Right, 10/28/2021). Family History:  Her family history includes Anxiety disorder in her mother; Arthritis in her mother; COPD in her maternal grandfather; Depression in her mother; Diabetes in her maternal grandfather; Heart attack in her maternal grandfather; Heart disease in her maternal grandfather; Heart murmur in her father; Hyperlipidemia in her maternal grandmother; Hypertension in her maternal grandfather and mother; Supraventricular tachycardia in her mother.  REVIEW OF SYSTEMS  : All other systems reviewed and negative except where noted in the History of Present Illness.  PHYSICAL EXAM: LMP 05/11/2023 (Exact Date)  General Appearance: Well nourished, in no apparent distress. Head:   Normocephalic and atraumatic. Eyes:  sclerae anicteric,conjunctive pink  Respiratory: Respiratory effort normal, BS equal bilaterally without rales, rhonchi, wheezing. Cardio: RRR with no MRGs. Peripheral pulses intact.  Abdomen: Soft,  Obese ,active bowel sounds. mild tenderness in the lower abdomen. Without guarding and Without rebound. Rectal: Not evaluated Musculoskeletal: Full ROM, Normal gait. Without edema. Skin:  Dry and intact without significant lesions or rashes Neuro: Alert and  oriented x4;  No focal deficits. Psych:  Cooperative. Normal mood and affect.    Doree Albee, PA-C 9:36 AM

## 2023-06-16 NOTE — ED Provider Notes (Signed)
 [facility]    CSN: [id number] Arrival date & time: 06/16/23  0808      History   Chief Complaint Chief Complaint  Patient presents with   URI    HPI Jenna Henry is a 28 y.o. female.   Patient presents to clinic for complaints of lower back pain, chills, nasal congestion, rhinorrhea, headache and generalized bodyaches with diminished appetite and fatigue since December 20.  Initially her child was sick with these symptoms, her child has since improved.  Was considering going to the emergency department last night, but the wait was too long.  She will wake up in the morning and her nose will be clogged and she can only breathe out of her mouth, reports this will make her feel short of breath and she will use her albuterol  inhaler.  She does not have to use her albuterol  inhaler throughout the day.  No current shortness of breath.  Denies any wheezing.  She does have a dry cough.  Reports a headache, sinus pressure and a increase in sinus pressure with bending forward.  Has tried TheraFlu, DayQuil and NyQuil.  Over-the-counter cough drops and no improvement.  Denies any dysuria, odor, frequency or urgency.  She is sexually active, has PCOS and irregular menstrual cycles.   The history is provided by the patient and medical records.  URI   Past Medical History:  Diagnosis Date   Anxiety    Asthma    Depression    Herpes    PCOS (polycystic ovarian syndrome)    Polycystic ovaries     Patient Active Problem List   Diagnosis Date Noted   Obesity (BMI 30.0-34.9) 04/26/2023   Fatty liver 11/17/2022   Hydradenitis 06/23/2022   PCOS (polycystic ovarian syndrome) 06/23/2022   Mild intermittent asthma 06/23/2022   Sprain of anterior talofibular ligament of right ankle    Syndesmotic disruption of right ankle    Head trauma, sequela 06/03/2021   New daily persistent headache 06/03/2021   Dizziness and giddiness 06/03/2021   MDD (major depressive disorder)  10/10/2018   Hypokalemia 10/09/2018   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   Palpitations 11/09/2017   Chest pain 11/09/2017   Anxiety state 02/28/2017   Insomnia 02/28/2017   Herpes 11/12/2016   Irregular menstruation 07/06/2016    Past Surgical History:  Procedure Laterality Date   ANKLE RECONSTRUCTION Right 10/28/2021   Procedure: RIGHT ANTERIOR TALOFIBULAR LIGAMENT REPAIR AND SYNDESMOSIS FIXATION;  Surgeon: Timothy Ford, MD;  Location: [facility];  Service: Orthopedics;  Laterality: Right;  regional block   CYSTECTOMY     axillary area per patient    OB History     Gravida  0   Para  0   Term  0   Preterm  0   AB  0   Living  0      SAB  0   IAB  0   Ectopic  0   Multiple  0   Live Births  0            Home Medications    Prior to Admission medications   Medication Sig Start Date End Date Taking? Authorizing Provider  amoxicillin  (AMOXIL ) 500 MG capsule Take 1 capsule (500 mg total) by mouth 2 (two) times daily for 7 days. 06/16/23 06/23/23 Yes Luken Shadowens  N, FNP  albuterol  (VENTOLIN  HFA) 108 (90 Base) MCG/ACT inhaler Inhale 2 puffs into the lungs every 4 (four) hours as needed for wheezing or  shortness of breath. (Must have office visit for refills) 06/23/22   Aida House, MD  hyoscyamine  (LEVBID) 0.375 MG 12 hr tablet Take 0.375 mg by mouth as needed. Patient not taking: Reported on 06/16/2023    [provider]  metFORMIN  (GLUCOPHAGE ) 500 MG tablet Take 1 tablet (500 mg total) by mouth 2 (two) times daily with a meal. 04/21/23   Aida House, MD  Semaglutide -Weight Management (WEGOVY ) 0.25 MG/0.5ML SOAJ Inject 0.25 mg into the skin once a week. Patient not taking: Reported on 04/23/2023 04/21/23   Aida House, MD  Semaglutide -Weight Management (WEGOVY ) 0.5 MG/0.5ML SOAJ Inject 0.5 mg into the skin once a week. Patient not taking: Reported on 04/23/2023 04/21/23   Aida House, MD   Semaglutide -Weight Management (WEGOVY ) 1 MG/0.5ML SOAJ Inject 1 mg into the skin once a week. Patient not taking: Reported on 04/23/2023 04/21/23   Aida House, MD  valACYclovir  (VALTREX ) 1000 MG tablet Take 1 tablet (1,000 mg total) by mouth 2 (two) times daily. 08/31/22   Aida House, MD    Family History Family History  Problem Relation Age of Onset   Depression Mother    Arthritis Mother    Hypertension Mother    Anxiety disorder Mother    Supraventricular tachycardia Mother    Heart murmur Father    Hyperlipidemia Maternal Grandmother    Hypertension Maternal Grandfather    Heart disease Maternal Grandfather    Diabetes Maternal Grandfather    COPD Maternal Grandfather    Heart attack Maternal Grandfather    Colon cancer Neg Hx    Esophageal cancer Neg Hx    Rectal cancer Neg Hx    Stomach cancer Neg Hx     Social History Social History   Tobacco Use   Smoking status: Former    Types: Cigarettes   Smokeless tobacco: Never   Tobacco comments:    Vaping    04/08/2023 Patient smokes marijuana daily  Vaping Use   Vaping status: Every Day   Substances: Nicotine , Flavoring  Substance Use Topics   Alcohol use: Yes    Alcohol/week: 2.0 standard drinks of alcohol    Types: 2 Cans of beer per week    Comment: occassional   Drug use: Yes    Types: Marijuana    Comment: daily     Allergies   Ortho tri-cyclen [norgestimate -eth estradiol ]   Review of Systems Review of Systems  Per HPI   Physical Exam Triage Vital Signs ED Triage Vitals  Encounter Vitals Group     BP 06/16/23 0918 107/72     Systolic BP Percentile --      Diastolic BP Percentile --      Pulse Rate 06/16/23 0918 71     Resp 06/16/23 0918 18     Temp 06/16/23 0918 99 F (37.2 C)     Temp Source 06/16/23 0918 Oral     SpO2 06/16/23 0918 97 %     Weight --      Height --      Head Circumference --      Peak Flow --      Pain Score 06/16/23 0915 9     Pain Loc --      Pain  Education --      Exclude from Growth Chart --    No data found.  Updated Vital Signs BP 107/72 (BP Location: Right Arm)   Pulse 71   Temp 99 F (37.2 C) (  Oral)   Resp 18   LMP 05/11/2023 (Exact Date)   SpO2 97%   Visual Acuity Right Eye Distance:   Left Eye Distance:   Bilateral Distance:    Right Eye Near:   Left Eye Near:    Bilateral Near:     Physical Exam Vitals and nursing note reviewed.  Constitutional:      Appearance: Normal appearance.  HENT:     Head: Normocephalic and atraumatic.     Right Ear: External ear normal.     Left Ear: External ear normal.     Nose: Nose normal.     Mouth/Throat:     Mouth: Mucous membranes are moist.  Eyes:     Conjunctiva/sclera: Conjunctivae normal.  Cardiovascular:     Rate and Rhythm: Normal rate and regular rhythm.     Heart sounds: Normal heart sounds. No murmur heard. Pulmonary:     Effort: Pulmonary effort is normal. No respiratory distress.     Breath sounds: Normal breath sounds.  Abdominal:     Tenderness: There is no right CVA tenderness or left CVA tenderness.  Musculoskeletal:        General: Normal range of motion.  Skin:    General: Skin is warm and dry.  Neurological:     General: No focal deficit present.     Mental Status: She is alert and oriented to person, place, and time.  Psychiatric:        Mood and Affect: Mood normal.        Behavior: Behavior normal.      UC Treatments / Results  Labs (all labs ordered are listed, but only abnormal results are displayed) Labs Reviewed  POCT URINE PREGNANCY - Abnormal; Notable for the following components:      Result Value   Preg Test, Ur Positive (*)    All other components within normal limits  POCT URINALYSIS DIP (MANUAL ENTRY)    EKG   Radiology No results found.  Procedures Procedures (including critical care time)  Medications Ordered in UC Medications - No data to display  Initial Impression / Assessment and Plan / UC Course  I  have reviewed the triage vital signs and the nursing notes.  Pertinent labs & imaging results that were available during my care of the patient were reviewed by me and considered in my medical decision making (see chart for details).  Vitals and triage reviewed, patient is hemodynamically stable.  Heart with regular rate and rhythm, lungs are vesicular.  Negative for CVA tenderness.  Posterior pharynx with some erythema and postnasal drip.  Uvula midline, tonsils without exudate.  Urinalysis is negative.  Urine pregnancy was positive.  This would explain some of the low back pain.  Patient also has been having nipple pain.  Suspect few weeks gestation, encouraged OB/GYN follow-up.  Sinus tenderness and headache, symptom duration for over 2 weeks.  Prescribed amoxicillin  for sinusitis.  Plan of care, follow-up care return precautions given, no questions at this time.    Final Clinical Impressions(s) / UC Diagnoses   Final diagnoses:  Acute non-recurrent maxillary sinusitis  Positive urine pregnancy test     Discharge Instructions      Your urine pregnancy test was positive.  Please follow-up with an OB/GYN for further evaluation with this.  I have sent in antibiotics for sinusitis.  Take these as prescribed and with food.  Do not take any ibuprofen , as this can be harmful in early pregnancy.  For any  fever or bodyaches you can take 500 mg of Tylenol  every 6 hours.  Return to clinic for any new or urgent symptoms.       ED Prescriptions     Medication Sig Dispense Auth. Provider   amoxicillin  (AMOXIL ) 500 MG capsule Take 1 capsule (500 mg total) by mouth 2 (two) times daily for 7 days. 14 capsule Onesimo Bijou, FNP      PDMP not reviewed this encounter.   Harlow Lighter, Justus Duerr  N, FNP 06/16/23 1049

## 2023-06-17 ENCOUNTER — Ambulatory Visit: Payer: Medicaid Other | Admitting: Physician Assistant

## 2023-06-18 ENCOUNTER — Encounter (HOSPITAL_COMMUNITY): Payer: Self-pay | Admitting: *Deleted

## 2023-06-18 ENCOUNTER — Inpatient Hospital Stay (HOSPITAL_COMMUNITY)
Admission: AD | Admit: 2023-06-18 | Discharge: 2023-06-19 | Disposition: A | Discharge status: Home / Self Care | Payer: Medicaid Other | Attending: Obstetrics & Gynecology | Admitting: Obstetrics & Gynecology

## 2023-06-18 ENCOUNTER — Inpatient Hospital Stay (HOSPITAL_COMMUNITY): Payer: Medicaid Other

## 2023-06-18 DIAGNOSIS — O219 Vomiting of pregnancy, unspecified: Secondary | ICD-10-CM | POA: Diagnosis not present

## 2023-06-18 DIAGNOSIS — R109 Unspecified abdominal pain: Secondary | ICD-10-CM | POA: Diagnosis not present

## 2023-06-18 DIAGNOSIS — Z3A01 Less than 8 weeks gestation of pregnancy: Secondary | ICD-10-CM | POA: Insufficient documentation

## 2023-06-18 DIAGNOSIS — O3680X0 Pregnancy with inconclusive fetal viability, not applicable or unspecified: Secondary | ICD-10-CM | POA: Diagnosis not present

## 2023-06-18 DIAGNOSIS — O26899 Other specified pregnancy related conditions, unspecified trimester: Secondary | ICD-10-CM

## 2023-06-18 DIAGNOSIS — O26891 Other specified pregnancy related conditions, first trimester: Secondary | ICD-10-CM | POA: Diagnosis present

## 2023-06-18 LAB — COMPREHENSIVE METABOLIC PANEL
ALT: 26 U/L (ref 0–44)
AST: 23 U/L (ref 15–41)
Albumin: 3.9 g/dL (ref 3.5–5.0)
Alkaline Phosphatase: 48 U/L (ref 38–126)
Anion gap: 11 (ref 5–15)
BUN: 5 mg/dL — ABNORMAL LOW (ref 6–20)
CO2: 24 mmol/L (ref 22–32)
Calcium: 10.4 mg/dL — ABNORMAL HIGH (ref 8.9–10.3)
Chloride: 104 mmol/L (ref 98–111)
Creatinine, Ser: 0.63 mg/dL (ref 0.44–1.00)
GFR, Estimated: >60 mL/min (ref >60)
Glucose, Bld: 91 mg/dL (ref 70–99)
Potassium: 3.1 mmol/L — ABNORMAL LOW (ref 3.5–5.1)
Sodium: 139 mmol/L (ref 135–145)
Total Bilirubin: 1.2 mg/dL (ref 0.0–1.2)
Total Protein: 7 g/dL (ref 6.5–8.1)

## 2023-06-18 LAB — CBC
HCT: 36.8 % (ref 36.0–46.0)
Hemoglobin: 12.8 g/dL (ref 12.0–15.0)
MCH: 32.8 pg (ref 26.0–34.0)
MCHC: 34.8 g/dL (ref 30.0–36.0)
MCV: 94.4 fL (ref 80.0–100.0)
Platelets: 292 10*3/uL (ref 150–400)
RBC: 3.9 MIL/uL (ref 3.87–5.11)
RDW: 14.1 % (ref 11.5–15.5)
WBC: 8.5 10*3/uL (ref 4.0–10.5)
nRBC: 0 % (ref 0.0–0.2)

## 2023-06-18 LAB — URINALYSIS, ROUTINE W REFLEX MICROSCOPIC
Bilirubin Urine: NEGATIVE
Glucose, UA: NEGATIVE mg/dL
Hgb urine dipstick: NEGATIVE
Ketones, ur: NEGATIVE mg/dL
Leukocytes,Ua: NEGATIVE
Nitrite: NEGATIVE
Protein, ur: NEGATIVE mg/dL
Specific Gravity, Urine: 1.023 (ref 1.005–1.030)
pH: 5 (ref 5.0–8.0)

## 2023-06-18 LAB — WET PREP, GENITAL
Clue Cells Wet Prep HPF POC: NONE SEEN
Sperm: NONE SEEN
Trich, Wet Prep: NONE SEEN
WBC, Wet Prep HPF POC: <10 (ref <10)
Yeast Wet Prep HPF POC: NONE SEEN

## 2023-06-18 LAB — HCG, QUANTITATIVE, PREGNANCY: hCG, Beta Chain, Quant, S: 3716 m[IU]/mL — ABNORMAL HIGH (ref <5)

## 2023-06-18 LAB — ABO/RH: ABO/RH(D): O POS

## 2023-06-18 MED ORDER — DOXYLAMINE-PYRIDOXINE 10-10 MG PO TBEC
2.0000 | DELAYED_RELEASE_TABLET | Freq: Every day | ORAL | 2 refills | Status: DC
Start: 2023-06-18 — End: 2023-12-12

## 2023-06-18 MED ORDER — ONDANSETRON 4 MG PO TBDP
4.0000 mg | ORAL_TABLET | Freq: Once | ORAL | Status: AC
Start: 2023-06-18 — End: 2023-06-18
  Administered 2023-06-18: 4 mg via ORAL
  Filled 2023-06-18: qty 1

## 2023-06-18 MED ORDER — TRAMADOL HCL 50 MG PO TABS
50.0000 mg | ORAL_TABLET | Freq: Once | ORAL | Status: AC
  Administered 2023-06-18: 50 mg via ORAL
  Filled 2023-06-18: qty 1

## 2023-06-18 NOTE — MAU Provider Note (Signed)
History     CSN: [id number]  Arrival date and time: 06/18/23 2017   Event Date/Time   First Provider Initiated Contact with Patient 06/18/23 2047      Chief Complaint  Patient presents with   Abdominal Pain   Emesis   Jenna Henry is a 28 y.o. G1P0 at [redacted]w[redacted]d by Definite LMP of May 11, 2023.  She presents today for lower abdominal pain and nausea.  Patient reports pain is cramping and intermittent in nature.  She rates the pain a 8/10 and denies worsening or relieving factors.  Patient also reports nausea and occasional vomiting.  She denies vaginal bleeding or discharge of concern.  Reports some occasional spitting.    OB History     Gravida  1   Para  0   Term  0   Preterm  0   AB  0   Living  0      SAB  0   IAB  0   Ectopic  0   Multiple  0   Live Births  0           Past Medical History:  Diagnosis Date   Anxiety    Asthma    Depression    Herpes    PCOS (polycystic ovarian syndrome)    Polycystic ovaries     Past Surgical History:  Procedure Laterality Date   ANKLE RECONSTRUCTION Right 10/28/2021   Procedure: RIGHT ANTERIOR TALOFIBULAR LIGAMENT REPAIR AND SYNDESMOSIS FIXATION;  Surgeon: Nadara Mustard, MD;  Location: [facility];  Service: Orthopedics;  Laterality: Right;  regional block   CYSTECTOMY     axillary area per patient    Family History  Problem Relation Age of Onset   Depression Mother    Arthritis Mother    Hypertension Mother    Anxiety disorder Mother    Supraventricular tachycardia Mother    Heart murmur Father    Hyperlipidemia Maternal Grandmother    Hypertension Maternal Grandfather    Heart disease Maternal Grandfather    Diabetes Maternal Grandfather    COPD Maternal Grandfather    Heart attack Maternal Grandfather    Colon cancer Neg Hx    Esophageal cancer Neg Hx    Rectal cancer Neg Hx    Stomach cancer Neg Hx     Social History   Tobacco Use   Smoking status: Former    Types:  Cigarettes   Smokeless tobacco: Never   Tobacco comments:    Vaping    04/08/2023 Patient smokes marijuana daily  Vaping Use   Vaping status: Every Day   Substances: Nicotine, Flavoring  Substance Use Topics   Alcohol use: Yes    Alcohol/week: 2.0 standard drinks of alcohol    Types: 2 Cans of beer per week    Comment: occassional   Drug use: Yes    Types: Marijuana    Comment: daily    Allergies:  Allergies  Allergen Reactions   Ortho Tri-Cyclen [Norgestimate-Eth Estradiol] Hives, Itching, Rash and Other (See Comments)    "Caused burning all over"    Medications Prior to Admission  Medication Sig Dispense Refill Last Dose/Taking   albuterol (VENTOLIN HFA) 108 (90 Base) MCG/ACT inhaler Inhale 2 puffs into the lungs every 4 (four) hours as needed for wheezing or shortness of breath. (Must have office visit for refills) 6.7 g 2    amoxicillin (AMOXIL) 500 MG capsule Take 1 capsule (500 mg total) by mouth 2 (two) times  daily for 7 days. 14 capsule 0    hyoscyamine (LEVBID) 0.375 MG 12 hr tablet Take 0.375 mg by mouth as needed. (Patient not taking: Reported on 06/16/2023)      metFORMIN (GLUCOPHAGE) 500 MG tablet Take 1 tablet (500 mg total) by mouth 2 (two) times daily with a meal. 180 tablet 3    Semaglutide-Weight Management (WEGOVY) 0.25 MG/0.5ML SOAJ Inject 0.25 mg into the skin once a week. (Patient not taking: Reported on 04/23/2023) 2 mL 0    Semaglutide-Weight Management (WEGOVY) 0.5 MG/0.5ML SOAJ Inject 0.5 mg into the skin once a week. (Patient not taking: Reported on 04/23/2023) 2 mL 0    Semaglutide-Weight Management (WEGOVY) 1 MG/0.5ML SOAJ Inject 1 mg into the skin once a week. (Patient not taking: Reported on 04/23/2023) 2 mL 0    valACYclovir (VALTREX) 1000 MG tablet Take 1 tablet (1,000 mg total) by mouth 2 (two) times daily. 14 tablet 2     Review of Systems  Constitutional:  Negative for chills and fever.  Gastrointestinal:  Negative for nausea and vomiting.   Genitourinary:  Negative for difficulty urinating, dysuria, vaginal bleeding and vaginal discharge.   Physical Exam   Last menstrual period 05/11/2023.  Physical Exam Vitals reviewed. Exam conducted with a chaperone present Stanton Kidney, RN).  Constitutional:      Appearance: Normal appearance. She is well-developed.  Eyes:     Conjunctiva/sclera: Conjunctivae normal.  Cardiovascular:     Rate and Rhythm: Normal rate.  Pulmonary:     Effort: Pulmonary effort is normal. No respiratory distress.  Musculoskeletal:        General: Normal range of motion.     Cervical back: Normal range of motion.  Skin:    General: Skin is warm and dry.  Neurological:     Mental Status: She is alert and oriented to person, place, and time.  Psychiatric:        Mood and Affect: Mood normal.        Behavior: Behavior normal.    MAU Course  Procedures Results for orders placed or performed during the hospital encounter of 06/18/23 (from the past 24 hours)  Urinalysis, Routine w reflex microscopic -Urine, Clean Catch     Status: None   Collection Time: 06/18/23  8:49 PM  Result Value Ref Range   Color, Urine YELLOW YELLOW   APPearance CLEAR CLEAR   Specific Gravity, Urine 1.023 1.005 - 1.030   pH 5.0 5.0 - 8.0   Glucose, UA NEGATIVE NEGATIVE mg/dL   Hgb urine dipstick NEGATIVE NEGATIVE   Bilirubin Urine NEGATIVE NEGATIVE   Ketones, ur NEGATIVE NEGATIVE mg/dL   Protein, ur NEGATIVE NEGATIVE mg/dL   Nitrite NEGATIVE NEGATIVE   Leukocytes,Ua NEGATIVE NEGATIVE  Wet prep, genital     Status: None   Collection Time: 06/18/23  8:49 PM   Specimen: PATH Cytology Cervicovaginal Ancillary Only  Result Value Ref Range   Yeast Wet Prep HPF POC NONE SEEN NONE SEEN   Trich, Wet Prep NONE SEEN NONE SEEN   Clue Cells Wet Prep HPF POC NONE SEEN NONE SEEN   WBC, Wet Prep HPF POC <10 <10   Sperm NONE SEEN   CBC     Status: None   Collection Time: 06/18/23  9:25 PM  Result Value Ref Range   WBC 8.5 4.0 - 10.5  K/uL   RBC 3.90 3.87 - 5.11 MIL/uL   Hemoglobin 12.8 12.0 - 15.0 g/dL   HCT 46.9 62.9 - 52.8 %  MCV 94.4 80.0 - 100.0 fL   MCH 32.8 26.0 - 34.0 pg   MCHC 34.8 30.0 - 36.0 g/dL   RDW 40.9 81.1 - 91.4 %   Platelets 292 150 - 400 K/uL   nRBC 0.0 0.0 - 0.2 %  ABO/Rh     Status: None   Collection Time: 06/18/23  9:25 PM  Result Value Ref Range   ABO/RH(D) O POS    No rh immune globuloin      NOT A RH IMMUNE GLOBULIN CANDIDATE, PT RH POSITIVE Performed at [facility], [address] [place], [place] [address]   hCG, quantitative, pregnancy     Status: Abnormal   Collection Time: 06/18/23  9:25 PM  Result Value Ref Range   hCG, Beta Chain, Quant, S 3,716 (H) <5 mIU/mL  Comprehensive metabolic panel     Status: Abnormal   Collection Time: 06/18/23  9:25 PM  Result Value Ref Range   Sodium 139 135 - 145 mmol/L   Potassium 3.1 (L) 3.5 - 5.1 mmol/L   Chloride 104 98 - 111 mmol/L   CO2 24 22 - 32 mmol/L   Glucose, Bld 91 70 - 99 mg/dL   BUN 5 (L) 6 - 20 mg/dL   Creatinine, Ser 6.21 0.44 - 1.00 mg/dL   Calcium 30.8 (H) 8.9 - 10.3 mg/dL   Total Protein 7.0 6.5 - 8.1 g/dL   Albumin 3.9 3.5 - 5.0 g/dL   AST 23 15 - 41 U/L   ALT 26 0 - 44 U/L   Alkaline Phosphatase 48 38 - 126 U/L   Total Bilirubin 1.2 0.0 - 1.2 mg/dL   GFR, Estimated >65 >78 mL/min   Anion gap 11 5 - 15   US OB LESS THAN 14 WEEKS WITH OB TRANSVAGINAL Result Date: 06/18/2023 CLINICAL DATA:  Abdominal pain, pregnant EXAM: OBSTETRIC <14 WK [place] AND TRANSVAGINAL OB US TECHNIQUE: Both transabdominal and transvaginal ultrasound examinations were performed for complete evaluation of the gestation as well as the maternal uterus, adnexal regions, and pelvic cul-de-sac. Transvaginal technique was performed to assess early pregnancy. COMPARISON:  None Available. FINDINGS: Intrauterine gestational sac: Single Yolk sac:  Visualized. Embryo:  Not Visualized. Cardiac Activity: Not Visualized. MSD: 7.0 mm   5 w   3 d Subchorionic  hemorrhage:  None visualized. Maternal uterus/adnexae: Right ovary measures 2.5 x 3.4 x 2.5 cm and the left ovary measures 2.2 x 4.1 x 1.9 cm. No adnexal masses. Trace pelvic free fluid may be physiologic. IMPRESSION: 1. Probable early intrauterine gestational sac and yolk sac, but no fetal pole or cardiac activity yet visualized. Recommend follow-up quantitative B-HCG levels and follow-up US in 14 days to assess viability. This recommendation follows SRU consensus guidelines: Diagnostic Criteria for Nonviable Pregnancy Early in the First Trimester. Malva Limes Med 2013; 469:6295-28. 2. Trace pelvic free fluid, likely physiologic. Electronically Signed   By: Sharlet Salina M.D.   On: 06/18/2023 22:14     MDM Physical Exam Cultures: Wet Prep and GC/CT Labs: UA, UPT, CBC, CMP, hCG, ABO Ultrasound Antiemetic Analgesic Coordination of Care Assessment and Plan  27 year old G1P0 at 5.4 weeks Abdominal Pain Nausea  -POC Reviewed. -Exam performed.  -Labs ordered. -Patient offered and accepts pain medication, but declines tylenol reporting h/o suicide attempt using said med.  Tylenol added to allergy list. Patient informed.  -Discussed usage of Tramadol, patient agreeable. 50mg  ordered. -Will  give Zofran for nausea first.  Discussed usage of Diclegis  daily for nausea management.  -Discussed OTC methods to relieve excessive saliva including small sips of water t/o the day or sugar free hard candy or chewing gum. If no relief consider medications.      Cherre Robins 06/18/2023, 8:47 PM   Reassessment (11:33 PM) -Results as above and provider to discuss. -Patient informed of need for additional [place]. -Order placed for f/u US.  -Rx for Diclegis sent to pharmacy on file.  -Encouraged to return to MAU if symptoms worsen or with the onset of new symptoms. -Discharged to home in stable condition.   Cherre Robins MSN, CNM Advanced Practice Provider, [facility] [employer]

## 2023-06-18 NOTE — MAU Note (Signed)
Pt says was vomiting the 12-20  and still vomiting.  LMP 12-10 HPT- positive  and Urgent Care on 1-15 - Positive UPT  And spitting  Started lower abd pain on 12-20  -8/10- No VB

## 2023-06-21 LAB — GC/CHLAMYDIA PROBE AMP (~~LOC~~) NOT AT ARMC
Chlamydia: NEGATIVE
Comment: NEGATIVE
Comment: NORMAL
Neisseria Gonorrhea: NEGATIVE

## 2023-07-01 ENCOUNTER — Ambulatory Visit (HOSPITAL_COMMUNITY)
Admission: RE | Admit: 2023-07-01 | Discharge: 2023-07-01 | Disposition: A | Discharge status: Home / Self Care | Payer: Medicaid Other | Source: Ambulatory Visit

## 2023-07-01 DIAGNOSIS — O26899 Other specified pregnancy related conditions, unspecified trimester: Secondary | ICD-10-CM | POA: Diagnosis present

## 2023-07-01 DIAGNOSIS — O3680X0 Pregnancy with inconclusive fetal viability, not applicable or unspecified: Secondary | ICD-10-CM | POA: Diagnosis present

## 2023-07-01 DIAGNOSIS — R109 Unspecified abdominal pain: Secondary | ICD-10-CM | POA: Diagnosis present

## 2023-07-01 DIAGNOSIS — Z3A01 Less than 8 weeks gestation of pregnancy: Secondary | ICD-10-CM | POA: Insufficient documentation

## 2023-07-07 NOTE — Progress Notes (Signed)
 Cardiology Office Note:  .   Date:  07/08/2023  ID:  Jenna Henry, DOB 03/16/1996, MRN [id number] PCP: Ozell Heron HERO, MD   [facility] Providers Cardiologist:  Darryle ONEIDA Decent, MD { History of Present Illness: .    Chief Complaint  Patient presents with   Follow-up    3 months.    Jenna Henry is a 28 y.o. female with history of PCOS who presents for follow-up. Evaluated for syncope with seizure like activity. Monitor with brief SVT. EEG normal. Echo and brain MRI not completed. Now pregnant.    History of Present Illness   Jenna Henry is a 28 year old female who presents with concerns for seizure-like activity following a syncopal episode.  In November, she experienced a syncopal episode with concerns for seizure-like activity. Since then, no further episodes have occurred. A monitor revealed brief SVT lasting thirteen beats, but she remained asymptomatic with no sustained arrhythmias. She reports a slight imbalance but no further syncope. She has increased her water  intake to five to six bottles daily.  She has not completed her echocardiogram due to a lapse in Medicaid coverage, which has now been reinstated.  She consulted a neurologist who did not initiate seizure medications. An EEG was performed and returned normal results. 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  She is currently eight weeks pregnant, having discovered her pregnancy during a recent ER visit for abdominal pain. She experiences significant morning nausea, sometimes waking her from sleep, but otherwise feels well. She is working with her OB on managing the nausea.            Problem List PCOS Anxiety     ROS: All other ROS reviewed and negative. Pertinent positives noted in the HPI.     Studies Reviewed: SABRA        Zio 04/01/2023 HR 39 - 179,  average 78 bpm. 1 wide complex tachycardia (13 beats), either non-sustained ventricular tachycardia or supraventricular tachycardia with aberrancy.  No other ventricular ectopy. No atrial fibrillation detected. Rare supraventricular ectopy. No sustained arrhythmias. Symptom trigger episodes correspond to sinus rhythm. Physical Exam:   VS:  BP 94/62 (BP Location: Left Arm, Patient Position: Sitting, Cuff Size: Normal)   Pulse 70   Ht 5' 5.5 (1.664 m)   Wt 192 lb (87.1 kg)   LMP 05/11/2023 (Exact Date)   BMI 31.46 kg/m    Wt Readings from Last 3 Encounters:  07/08/23 192 lb (87.1 kg)  06/18/23 190 lb 3.2 oz (86.3 kg)  04/23/23 185 lb (83.9 kg)    GEN: Well nourished, well developed in no acute distress NECK: No JVD; No carotid bruits CARDIAC: RRR, no murmurs, rubs, gallops RESPIRATORY:  Clear to auscultation without rales, wheezing or rhonchi  ABDOMEN: Soft, non-tender, non-distended EXTREMITIES:  No edema; No deformity  ASSESSMENT AND PLAN: .   Assessment and Plan    Syncope, possible seizure  -EKG normal. TSH, CBC normal. Monitor with brief SVT that does not explain symptoms.  -will complete echo.  -could be seizure and will work with neuro -if echo is normal, no further cardiac testing -Defer driving and work restrictions to neurology until seizures are definitively ruled out. -now pregnant which could exacerbate vasovagal symptoms. Hydration and regular meals recommended.  -Follow-up as needed based on testing results.  Follow-up: Return if symptoms worsen or fail to improve.   Signed, Darryle DASEN. Barbaraann, MD, [facility]  [address] [place], [place] [address] [phone]  1:33 PM

## 2023-07-08 ENCOUNTER — Encounter: Payer: Self-pay | Admitting: Cardiovascular Disease

## 2023-07-08 ENCOUNTER — Ambulatory Visit: Payer: Medicaid Other | Attending: Cardiovascular Disease | Admitting: Cardiovascular Disease

## 2023-07-08 VITALS — BP 94/62 | HR 70 | Ht 65.5 in | Wt 192.0 lb

## 2023-07-08 DIAGNOSIS — R55 Syncope and collapse: Secondary | ICD-10-CM

## 2023-07-08 NOTE — Patient Instructions (Addendum)
 Medication Instructions:  Your physician recommends that you continue on your current medications as directed. Please refer to the Current Medication list given to you today.    *If you need a refill on your cardiac medications before your next appointment, please call your pharmacy*   Lab Work: None    If you have labs (blood work) drawn today and your tests are completely normal, you will receive your results only by: MyChart Message (if you have MyChart) OR A paper copy in the mail If you have any lab test that is abnormal or we need to change your treatment, we will call you to review the results.   Testing/Procedures:  Echo will be scheduled at [address].  Your physician has requested that you have an echocardiogram. Echocardiography is a painless test that uses sound waves to create images of your heart. It provides your doctor with information about the size and shape of your heart and how well your heart's chambers and valves are working. This procedure takes approximately one hour. There are no restrictions for this procedure. Please do NOT wear cologne, perfume, aftershave, or lotions (deodorant is allowed). Please arrive 15 minutes prior to your appointment time.    Follow-Up: At [facility], you and your health needs are our priority.  As part of our continuing mission to provide you with exceptional heart care, we have created designated Provider Care Teams.  These Care Teams include your primary Cardiologist (physician) and Advanced Practice Providers (APPs -  Physician Assistants and Nurse Practitioners) who all work together to provide you with the care you need, when you need it.  We recommend signing up for the patient portal called MyChart.  Sign up information is provided on this After Visit Summary.  MyChart is used to connect with patients for Virtual Visits (Telemedicine).  Patients are able to view lab/test results, encounter notes, upcoming  appointments, etc.  Non-urgent messages can be sent to your provider as well.   To learn more about what you can do with MyChart, go to [email or web address].    Your next appointment:   Follow up as needed pending results of cardiac testing   Provider:   Darryle ONEIDA Decent, MD    Other Instructions

## 2023-07-20 ENCOUNTER — Other Ambulatory Visit: Payer: Self-pay

## 2023-07-20 ENCOUNTER — Ambulatory Visit (INDEPENDENT_AMBULATORY_CARE_PROVIDER_SITE_OTHER): Payer: Medicaid Other | Admitting: *Deleted

## 2023-07-20 VITALS — BP 104/67 | HR 82 | Wt 195.7 lb

## 2023-07-20 DIAGNOSIS — O99210 Obesity complicating pregnancy, unspecified trimester: Secondary | ICD-10-CM | POA: Diagnosis not present

## 2023-07-20 DIAGNOSIS — Z3A10 10 weeks gestation of pregnancy: Secondary | ICD-10-CM

## 2023-07-20 DIAGNOSIS — Z3490 Encounter for supervision of normal pregnancy, unspecified, unspecified trimester: Secondary | ICD-10-CM | POA: Diagnosis not present

## 2023-07-20 MED ORDER — BLOOD PRESSURE KIT DEVI: 1.0000 | 0 refills | Status: AC | PRN

## 2023-07-20 MED ORDER — GOJJI WEIGHT SCALE MISC: 1.0000 | 0 refills | Status: DC

## 2023-07-20 MED ORDER — PRENATAL 27-1 MG PO TABS: 1.0000 | ORAL_TABLET | Freq: Every day | ORAL | 11 refills | Status: DC

## 2023-07-20 NOTE — Patient Instructions (Signed)
 Options for Doula Care in the [place] Area  As you review your birthing options, consider having a birth doula. A doula is trained to provide support before, during and just after you give birth. There are also postpartum doulas that help you adjust to new parenthood.  While doulas do not provide medical care, they do provide emotional, physical and educational support. A few months before your baby arrives, doulas can help answer questions, ease concerns and help you create and support your birthing plan.    Doulas can help reduce your stress and comfort you and your partner. They can help you cope with labor by helping you use breathing techniques, massage, creative labor positioning, essential oils and affirmations.   Studies show that the benefits of having a doula include:   A more positive birth experience  Fewer requests for pain-relief medication  Less likelihood of cesarean section, commonly called a c-section   Doulas are typically hired via a Ad[employer] account planner between you and the doula. We are happy to provide a list of the most active doulas in the area, all of whom are credentialed by [facility] and will not count as a visitor at your birth.  There are several options for no-cost doula care at our hospital, including:  [facility] Volunteer Doula Program Every [employer] Program A Cure 4 Moms Doula Study (available only at [employer] for Women, [place], [place] and [facility] offices)  For more information on these programs or to receive a list of doulas active in our area, please email [email or web address]

## 2023-07-20 NOTE — Progress Notes (Signed)
 New OB Intake  Patient came to office for new ob intake.  I discussed the limitations, risks, security and privacy concerns of performing an evaluation and management service by telephone and the availability of in person appointments. I also discussed with the patient that there may be a patient responsible charge related to this service. The patient expressed understanding and agreed to proceed.  I explained I am completing New OB Intake today. We discussed EDD of 02/15/2024, by Last Menstrual Period and dating [place].Marland Kitchen Pt is G1P0000. I reviewed her allergies, medications and Medical/Surgical/OB history.    Patient Active Problem List   Diagnosis Date Noted   Supervision of low-risk pregnancy 07/20/2023   Obesity in pregnancy 07/20/2023   Obesity (BMI 30.0-34.9) 04/26/2023   Fatty liver 11/17/2022   Hydradenitis 06/23/2022   PCOS (polycystic ovarian syndrome) 06/23/2022   Mild intermittent asthma 06/23/2022   MDD (major depressive disorder) 10/10/2018   Palpitations 11/09/2017   Chest pain 11/09/2017   Anxiety state 02/28/2017   Insomnia 02/28/2017   Herpes 11/12/2016   Irregular menstruation 07/06/2016    Concerns addressed today  Delivery Plans Plans to deliver at [facility]. Discussed the nature of our practice with multiple providers including residents and students. Due to the size of the practice, the delivering provider may not be the same as those providing prenatal care.   Patient is interested in water birth. Offered upcoming OB visit with CNM to discuss further.  MyChart/Babyscripts MyChart access verified. I explained pt will have some visits in office and some virtually. Babyscripts instructions given and order placed.  Blood Pressure Cuff/Weight Scale Blood pressure cuff ordered for patient to pick-up from [employer]. Explained after first prenatal appt pt will check weekly and document in Babyscripts. Patient does not have weight scale; order sent to [facility], patient may track weight weekly in Babyscripts.  Anatomy US Explained next scheduled [place] will be around 19 weeks. Anatomy US scheduled for 09/22/23 at 0815.  Is patient a CenteringPregnancy candidate?  Declined Declined due to Enrolled in [facility]   Is patient a Mom+Baby Combined Care candidate?  Accepted Confirmed patient does not intend to move from the area for at least 12 months, notifed Mom+Baby staff  Interested in [place]?  Yes, sent referral and doula dot phrase.   Is patient a candidate for Babyscripts Optimization? Yes, patient accepted    First visit review I reviewed new OB appt with patient. Explained pt will be seen by Lamont Snowball, NP  at first visit. Discussed Avelina Laine genetic screening with patient. She would like both Panorama and Horizon drawn with routine prenatal labs at new ob visit.    Last Pap Diagnosis  Date Value Ref Range Status  10/22/2022   Final   - Negative for intraepithelial lesion or malignancy (NILM)    Nancy Fetter 07/20/2023  11:18 AM

## 2023-07-22 ENCOUNTER — Encounter: Payer: Self-pay | Admitting: Family Medicine

## 2023-07-22 ENCOUNTER — Ambulatory Visit: Payer: Medicaid Other | Admitting: Family Medicine

## 2023-07-22 VITALS — BP 100/60 | HR 67 | Temp 98.9°F | Ht 65.5 in | Wt 193.5 lb

## 2023-07-22 DIAGNOSIS — L732 Hidradenitis suppurativa: Secondary | ICD-10-CM | POA: Diagnosis not present

## 2023-07-22 NOTE — Progress Notes (Unsigned)
 Established Patient Office Visit  Subjective   Patient ID: Jenna Henry, female    DOB: [birth year]  Age: 28 y.o. MRN: [id number]  Chief Complaint  Patient presents with   Medical Management of Chronic Issues    Pt is here for follow up today, she reports she couldn't get the wegovy because they "cut off" her medicaid but now she found out that she is pregnant and due in Sept. First appt with OB is next week. Has started her prenatals, LMP was 05/11/23. States she has a lot of nausea/ morning sickness, is taking doxylamine for the nausea, states that it is working well for her.  She was asking about wether or not she should stop the metformin, states that she was taking it up until last week when she stopped the medication.     Current Outpatient Medications  Medication Instructions   albuterol (VENTOLIN HFA) 108 (90 Base) MCG/ACT inhaler Inhale 2 puffs into the lungs every 4 (four) hours as needed for wheezing or shortness of breath. (Must have office visit for refills)   Blood Pressure Monitoring (BLOOD PRESSURE KIT) DEVI 1 Device, Does not apply, As needed   Doxylamine-Pyridoxine 10-10 MG TBEC 2 tablets, Oral, Daily at bedtime   Misc. Devices (GOJJI WEIGHT SCALE) MISC 1 Device, Does not apply, Weekly   Prenatal 27-1 MG TABS 1 tablet, Oral, Daily   valACYclovir (VALTREX) 1,000 mg, Oral, 2 times daily    Patient Active Problem List   Diagnosis Date Noted   Supervision of low-risk pregnancy 07/20/2023   Obesity in pregnancy 07/20/2023   Obesity (BMI 30.0-34.9) 04/26/2023   Fatty liver 11/17/2022   Hydradenitis 06/23/2022   PCOS (polycystic ovarian syndrome) 06/23/2022   Mild intermittent asthma 06/23/2022   MDD (major depressive disorder) 10/10/2018   Palpitations 11/09/2017   Chest pain 11/09/2017   Anxiety state 02/28/2017   Insomnia 02/28/2017   Herpes 11/12/2016   Irregular menstruation 07/06/2016      Review of Systems  All other systems reviewed and are  negative.     Objective:     BP 100/60   Pulse 67   Temp 98.9 F (37.2 C) (Oral)   Ht 5' 5.5" (1.664 m)   Wt 193 lb 8 oz (87.8 kg)   LMP 05/11/2023 (Exact Date)   SpO2 97%   BMI 31.71 kg/m  {Vitals History (Optional):23777}  Physical Exam Vitals reviewed.  Constitutional:      Appearance: Normal appearance. She is obese.  Cardiovascular:     Rate and Rhythm: Normal rate and regular rhythm.     Heart sounds: Normal heart sounds.  Pulmonary:     Effort: Pulmonary effort is normal.     Breath sounds: Normal breath sounds. No wheezing.  Neurological:     Mental Status: She is alert and oriented to person, place, and time.      No results found for any visits on 07/22/23.  {Labs (Optional):23779}  The ASCVD Risk score (Arnett DK, et al., 2019) failed to calculate for the following reasons:   The 2019 ASCVD risk score is only valid for ages 83 to 22    Assessment & Plan:  Hydradenitis Assessment & Plan: Under good control, I advised she should stop the metformin since she does not need it for glucose control. Pt is pregnant and will follow up with me in November and we will re-authorize the wegovy then.       Return in about 9 months (around 04/20/2024) for follow  up.    [name], MD

## 2023-07-22 NOTE — Assessment & Plan Note (Signed)
 Under good control, I advised she should stop the metformin since she does not need it for glucose control. Pt is pregnant and will follow up with me in November and we will re-authorize the wegovy then.

## 2023-07-27 ENCOUNTER — Encounter: Payer: Self-pay | Admitting: Family Medicine

## 2023-07-27 DIAGNOSIS — B009 Herpesviral infection, unspecified: Secondary | ICD-10-CM
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
## 2023-07-28 ENCOUNTER — Ambulatory Visit: Payer: Medicaid Other | Admitting: Neurology

## 2023-07-28 ENCOUNTER — Encounter: Payer: Self-pay | Admitting: Neurology

## 2023-07-28 VITALS — BP 110/70 | Ht 65.0 in | Wt 192.6 lb

## 2023-07-28 DIAGNOSIS — Z3A11 11 weeks gestation of pregnancy: Secondary | ICD-10-CM | POA: Diagnosis not present

## 2023-07-28 DIAGNOSIS — R569 Unspecified convulsions: Secondary | ICD-10-CM

## 2023-07-28 MED ORDER — VALACYCLOVIR HCL 1 G PO TABS: 1000.0000 mg | ORAL_TABLET | Freq: Two times a day (BID) | ORAL | 2 refills | Status: DC

## 2023-07-28 NOTE — Patient Instructions (Signed)
 As long as you are doing well, I am okay with holding off getting brain MRI until after you have your baby. [place] law states no driving for 6 months after last seizure or unexplained loss of consciousness Follow up 3 months.

## 2023-07-29 ENCOUNTER — Encounter: Payer: Medicaid Other | Admitting: Certified Nurse Midwife

## 2023-07-29 ENCOUNTER — Encounter: Payer: Medicaid Other | Admitting: Family Medicine

## 2023-08-10 ENCOUNTER — Encounter: Payer: Self-pay | Admitting: Cardiovascular Disease

## 2023-08-10 ENCOUNTER — Ambulatory Visit (HOSPITAL_COMMUNITY)
Admission: RE | Admit: 2023-08-10 | Discharge: 2023-08-10 | Disposition: A | Discharge status: Home / Self Care | Payer: Medicaid Other | Source: Ambulatory Visit | Attending: Cardiovascular Disease | Admitting: Cardiovascular Disease

## 2023-08-10 DIAGNOSIS — R55 Syncope and collapse: Secondary | ICD-10-CM | POA: Insufficient documentation

## 2023-08-10 LAB — ECHOCARDIOGRAM COMPLETE
AR max vel: 2.7 cm2
AV Area VTI: 2.47 cm2
AV Area mean vel: 2.43 cm2
AV Mean grad: 5 mmHg
AV Peak grad: 9.2 mmHg
Ao pk vel: 1.52 m/s
Area-P 1/2: 4.6 cm2
S' Lateral: 2.63 cm

## 2023-08-16 ENCOUNTER — Ambulatory Visit (INDEPENDENT_AMBULATORY_CARE_PROVIDER_SITE_OTHER): Payer: Medicaid Other | Admitting: Family Medicine

## 2023-08-16 ENCOUNTER — Other Ambulatory Visit: Payer: Self-pay

## 2023-08-16 VITALS — BP 102/74 | HR 81 | Wt 192.5 lb

## 2023-08-16 DIAGNOSIS — Z3492 Encounter for supervision of normal pregnancy, unspecified, second trimester: Secondary | ICD-10-CM

## 2023-08-16 NOTE — Progress Notes (Unsigned)
 INITIAL PRENATAL VISIT  Subjective:   Jenna Henry is being seen today for her first obstetrical visit.  This {is/is not:9024} a planned pregnancy. This {is/is not:9024} a desired pregnancy.  She is at [redacted]w[redacted]d gestation by *** Her obstetrical history is significant for {ob risk factors:10154}. Relationship with [name]: {fob:16621}. Patient {does/does not:19097} intend to breast feed. Pregnancy history fully reviewed.  Patient reports {sx:14538}.  Indications for ASA therapy (per uptodate) One of the following: Previous pregnancy with preeclampsia, especially early onset and with an adverse outcome {yes/no:20286} Multifetal gestation {yes/no:20286} Chronic hypertension {yes/no:20286} Type 1 or 2 diabetes mellitus {yes/no:20286} Chronic kidney disease {yes/no:20286} Autoimmune disease (antiphospholipid syndrome, systemic lupus erythematosus) {yes/no:20286}  Two or more of the following: Nulliparity {yes/no:20286} Obesity (body mass index >30 kg/m2) {yes/no:20286} Family history of preeclampsia in mother or sister {yes/no:20286} Age >=35 years {yes/no:20286} Sociodemographic characteristics (African American race, low socioeconomic level) {yes/no:20286} Personal risk factors (eg, previous pregnancy with low birth weight or small for gestational age infant, previous adverse pregnancy outcome [eg, stillbirth], interval >10 years between pregnancies) {yes/no:20286}  Indications for early GDM screening  First-degree relative with diabetes {yes/no:20286} BMI >30kg/m2 {yes/no:20286} Age > 25 {yes/no:20286} Previous birth of an infant weighing >=4000 g {yes/no:20286} Gestational diabetes mellitus in a previous pregnancy {yes/no:20286} Glycated hemoglobin >=5.7 percent (39 mmol/mol), impaired glucose tolerance, or impaired fasting glucose on previous testing {yes/no:20286} High-risk race/ethnicity (eg, African American, Latino, Native American, [place] American, Pacific Islander)  {yes/no:20286} Previous stillbirth of unknown cause {yes/no:20286} Maternal birthweight > 9 lbs {yes/no:20286} History of cardiovascular disease {yes/no:20286} Hypertension or on therapy for hypertension {yes/no:20286} High-density lipoprotein cholesterol level <35 mg/dL (7.56 mmol/L) and/or a triglyceride level >250 mg/dL (4.33 mmol/L) {IRJ/JO:84166} Polycystic ovary syndrome {yes/no:20286} Physical inactivity {yes/no:20286} Other clinical condition associated with insulin resistance (eg, severe obesity, acanthosis nigricans) {yes/no:20286} Current use of glucocorticoids {yes/no:20286}   Early screening tests: FBS, A1C, Random CBG, glucose challenge   Review of Systems:   Review of Systems  Objective:    Obstetric History OB History  Gravida Para Term Preterm AB Living  1 0 0 0 0 0  SAB IAB Ectopic Multiple Live Births  0 0 0 0 0    # Outcome Date GA Lbr Len/2nd Weight Sex Type Anes PTL Lv  1 Current             Past Medical History:  Diagnosis Date   Anxiety    Asthma    Depression    Herpes    Hydradenitis    Laceration of left thumb 03/10/2022   Pain in finger of left hand 02/12/2023   PCOS (polycystic ovarian syndrome)    Polycystic ovaries     Past Surgical History:  Procedure Laterality Date   ANKLE RECONSTRUCTION Right 10/28/2021   Procedure: RIGHT ANTERIOR TALOFIBULAR LIGAMENT REPAIR AND SYNDESMOSIS FIXATION;  Surgeon: Nadara Mustard, MD;  Location: [facility];  Service: Orthopedics;  Laterality: Right;  regional block   CYSTECTOMY     axillary area per patient    Current Outpatient Medications on File Prior to Visit  Medication Sig Dispense Refill   albuterol (VENTOLIN HFA) 108 (90 Base) MCG/ACT inhaler Inhale 2 puffs into the lungs every 4 (four) hours as needed for wheezing or shortness of breath. (Must have office visit for refills) 6.7 g 2   Blood Pressure Monitoring (BLOOD PRESSURE KIT) DEVI 1 Device by Does not apply route as  needed. 1 each 0   Doxylamine-Pyridoxine 10-10 MG TBEC Take 2 tablets  by mouth at bedtime. 60 tablet 2   Misc. Devices (GOJJI WEIGHT SCALE) MISC 1 Device by Does not apply route once a week. 1 each 0   Prenatal 27-1 MG TABS Take 1 tablet by mouth daily. 30 tablet 11   valACYclovir (VALTREX) 1000 MG tablet Take 1 tablet (1,000 mg total) by mouth 2 (two) times daily. 14 tablet 2   No current facility-administered medications on file prior to visit.    Allergies  Allergen Reactions   Tylenol [Acetaminophen] Other (See Comments)    History of Suicide attempt using tylenol. Request to avoid and not offer.    Ortho Tri-Cyclen [Norgestimate-Eth Estradiol] Hives, Itching, Rash and Other (See Comments)    "Caused burning all over"    Social History:  reports that she has quit smoking. Her smoking use included cigarettes. She has never used smokeless tobacco. She reports that she does not currently use alcohol after a past usage of about 2.0 standard drinks of alcohol per week. She reports current drug use. Drug: Marijuana.  Family History  Problem Relation Age of Onset   Depression Mother    Arthritis Mother    Hypertension Mother    Anxiety disorder Mother    Supraventricular tachycardia Mother    Heart murmur Father    Hyperlipidemia Maternal Grandmother    Hypertension Maternal Grandfather    Heart disease Maternal Grandfather    Diabetes Maternal Grandfather    COPD Maternal Grandfather    Heart attack Maternal Grandfather    Colon cancer Neg Hx    Esophageal cancer Neg Hx    Rectal cancer Neg Hx    Stomach cancer Neg Hx     The following portions of the patient's history were reviewed and updated as appropriate: allergies, current medications, past family history, past medical history, past social history, past surgical history and problem list.  Review of Systems Review of Systems    Physical Exam:  LMP 05/11/2023 (Exact Date)  CONSTITUTIONAL: Well-developed,  well-nourished female in no acute distress.  HENT:  Normocephalic, atraumatic.  Oropharynx is clear and moist EYES: Conjunctivae normal. No scleral icterus.  NECK: Normal range of motion, supple, no masses.  Normal thyroid.  SKIN: Skin is warm and dry. No rash noted. Not diaphoretic. No erythema. No pallor. MUSCULOSKELETAL: Normal range of motion. No tenderness.  No cyanosis, clubbing, or edema.   NEUROLOGIC: Alert and oriented to person, place, and time. Normal muscle tone coordination.  PSYCHIATRIC: Normal mood and affect. Normal behavior. Normal judgment and thought content. CARDIOVASCULAR: Normal heart rate noted, regular rhythm RESPIRATORY: Clear to auscultation bilaterally. Effort and breath sounds normal, no problems with respiration noted. BREASTS: Symmetric in size. No masses, skin changes, nipple drainage, or lymphadenopathy. ABDOMEN: Soft, normal bowel sounds, no distention noted.  No tenderness, rebound or guarding. Fundal ht: *** PELVIC: Normal appearing external genitalia; normal appearing vaginal mucosa and cervix.  No abnormal discharge noted.  Pap smear ***obtained.  Normal uterine size, no other palpable masses, no uterine or adnexal tenderness.              Assessment:    Pregnancy: G1P0000  There are no diagnoses linked to this encounter.    Plan:     Initial labs drawn. Prenatal vitamins. Problem list reviewed and updated. Reviewed in detail the nature of the practice with collaborative care between  Genetic screening discussed: NIPS/First trimester screen/Quad/AFP {requests/ordered/declines:14581}. Role of ultrasound in pregnancy discussed; Anatomy US: {requests/ordered/declines:14581}. Amniocentesis discussed: {amniocentesis:14582}. Follow up in {numbers 0-4:31231}  weeks. Weight gain recommendations per IOM guidelines reviewed: underweight/BMI 18.5 or less > 28 - 40 lbs; normal weight/BMI 18.5 - 24.9 > 25 - 35 lbs; overweight/BMI 25 - 29.9 > 15 - 25 lbs;  obese/BMI  30 or more > 11 - 20 lbs.  Discussed clinic routines, schedule of care and testing, genetic screening options, involvement of students and residents under the direct supervision of APPs and doctors and presence of female providers. Pt verbalized understanding.   Federico Flake, MD 08/16/2023 9:03 AM

## 2023-08-17 ENCOUNTER — Encounter: Payer: Self-pay | Admitting: Family Medicine

## 2023-08-17 LAB — CBC/D/PLT+RPR+RH+ABO+RUBIGG...
Antibody Screen: NEGATIVE
Basophils Absolute: 0 10*3/uL (ref 0.0–0.2)
Basos: 0 %
EOS (ABSOLUTE): 0.2 10*3/uL (ref 0.0–0.4)
Eos: 2 %
HCV Ab: NONREACTIVE
HIV Screen 4th Generation wRfx: NONREACTIVE
Hematocrit: 39.4 % (ref 34.0–46.6)
Hemoglobin: 13.3 g/dL (ref 11.1–15.9)
Hepatitis B Surface Ag: NEGATIVE
Immature Grans (Abs): 0 10*3/uL (ref 0.0–0.1)
Immature Granulocytes: 0 %
Lymphocytes Absolute: 2 10*3/uL (ref 0.7–3.1)
Lymphs: 25 %
MCH: 32.1 pg (ref 26.6–33.0)
MCHC: 33.8 g/dL (ref 31.5–35.7)
MCV: 95 fL (ref 79–97)
Monocytes Absolute: 0.5 10*3/uL (ref 0.1–0.9)
Monocytes: 6 %
Neutrophils Absolute: 5.5 10*3/uL (ref 1.4–7.0)
Neutrophils: 67 %
Platelets: 252 10*3/uL (ref 150–450)
RBC: 4.14 x10E6/uL (ref 3.77–5.28)
RDW: 13.8 % (ref 11.7–15.4)
RPR Ser Ql: NONREACTIVE
Rh Factor: POSITIVE
Rubella Antibodies, IGG: 1.01 {index} (ref >0.99)
WBC: 8.1 10*3/uL (ref 3.4–10.8)

## 2023-08-17 LAB — HCV INTERPRETATION

## 2023-08-17 LAB — HEMOGLOBIN A1C
Est. average glucose Bld gHb Est-mCnc: 105 mg/dL
Hgb A1c MFr Bld: 5.3 % (ref 4.8–5.6)

## 2023-08-18 ENCOUNTER — Encounter: Payer: Self-pay | Admitting: *Deleted

## 2023-08-19 ENCOUNTER — Ambulatory Visit: Payer: Medicaid Other | Admitting: Neurology

## 2023-08-22 LAB — PANORAMA PRENATAL TEST FULL PANEL:PANORAMA TEST PLUS 5 ADDITIONAL MICRODELETIONS: FETAL FRACTION: 10.4

## 2023-08-23 ENCOUNTER — Encounter: Payer: Self-pay | Admitting: Family Medicine

## 2023-08-25 ENCOUNTER — Encounter: Payer: Medicaid Other | Admitting: Family Medicine

## 2023-08-29 LAB — HORIZON CUSTOM: REPORT SUMMARY: POSITIVE — AB

## 2023-08-31 ENCOUNTER — Encounter: Payer: Self-pay | Admitting: Family Medicine

## 2023-08-31 ENCOUNTER — Telehealth: Payer: Self-pay

## 2023-08-31 DIAGNOSIS — Z148 Genetic carrier of other disease: Secondary | ICD-10-CM | POA: Insufficient documentation

## 2023-08-31 NOTE — Progress Notes (Signed)
 Verified name and DOB.  Called patient to inform on results. Patient was able to view MyChart message and does not have additional questions/concerns.  Felecia Shelling CMA

## 2023-08-31 NOTE — Telephone Encounter (Signed)
 Verified name and DOB.  Called patient to inform on results. Patient was able to view MyChart message and does not have additional questions/concerns.  Felecia Shelling CMA

## 2023-08-31 NOTE — Telephone Encounter (Signed)
-----   Message from Federico Flake sent at 08/31/2023 12:11 PM EDT ----- SMA carrier, recommend partner testing.

## 2023-09-22 ENCOUNTER — Ambulatory Visit: Payer: Medicaid Other | Admitting: Maternal & Fetal Medicine

## 2023-09-22 ENCOUNTER — Other Ambulatory Visit: Payer: Self-pay | Admitting: *Deleted

## 2023-09-22 ENCOUNTER — Ambulatory Visit: Payer: Medicaid Other | Attending: Certified Nurse Midwife

## 2023-09-22 ENCOUNTER — Ambulatory Visit: Payer: Medicaid Other | Admitting: Family Medicine

## 2023-09-22 ENCOUNTER — Ambulatory Visit: Payer: Medicaid Other

## 2023-09-22 VITALS — BP 97/62 | HR 76 | Wt 191.2 lb

## 2023-09-22 VITALS — BP 112/60 | HR 74

## 2023-09-22 DIAGNOSIS — Z148 Genetic carrier of other disease: Secondary | ICD-10-CM

## 2023-09-22 DIAGNOSIS — O99210 Obesity complicating pregnancy, unspecified trimester: Secondary | ICD-10-CM | POA: Insufficient documentation

## 2023-09-22 DIAGNOSIS — Z368A Encounter for antenatal screening for other genetic defects: Secondary | ICD-10-CM | POA: Insufficient documentation

## 2023-09-22 DIAGNOSIS — Z3A19 19 weeks gestation of pregnancy: Secondary | ICD-10-CM

## 2023-09-22 DIAGNOSIS — O99212 Obesity complicating pregnancy, second trimester: Secondary | ICD-10-CM | POA: Diagnosis not present

## 2023-09-22 DIAGNOSIS — Z3689 Encounter for other specified antenatal screening: Secondary | ICD-10-CM | POA: Insufficient documentation

## 2023-09-22 DIAGNOSIS — Z363 Encounter for antenatal screening for malformations: Secondary | ICD-10-CM | POA: Insufficient documentation

## 2023-09-22 DIAGNOSIS — E669 Obesity, unspecified: Secondary | ICD-10-CM | POA: Diagnosis not present

## 2023-09-22 DIAGNOSIS — Z3492 Encounter for supervision of normal pregnancy, unspecified, second trimester: Secondary | ICD-10-CM

## 2023-09-22 DIAGNOSIS — Z3490 Encounter for supervision of normal pregnancy, unspecified, unspecified trimester: Secondary | ICD-10-CM | POA: Diagnosis present

## 2023-09-22 NOTE — Patient Instructions (Signed)
   Considering Waterbirth? Guide for patients at [facility]) Why consider waterbirth? Gentle birth for babies  Less pain medicine used in labor  May allow for passive descent/less pushing  May reduce perineal tears  More mobility and instinctive maternal position changes  Increased maternal relaxation   Is waterbirth safe? What are the risks of infection, drowning or other complications? Infection:  Very low risk (3.7 % for tub vs 4.8% for bed)  7 in 8000 waterbirths with documented infection  Poorly cleaned equipment most common cause  Slightly lower group B strep transmission rate  Drowning  Maternal:  Very low risk  Related to seizures or fainting  Newborn:  Very low risk. No evidence of increased risk of respiratory problems in multiple large studies  Physiological protection from breathing under water  Avoid underwater birth if there are any fetal complications  Once baby's head is out of the water, keep it out.  Birth complication  Some reports of cord trauma, but risk decreased by bringing baby to surface gradually  No evidence of increased risk of shoulder dystocia. Mothers can usually change positions faster in water than in a bed, possibly aiding the maneuvers to free the shoulder.   There are 2 things you MUST do to have a waterbirth with [facility]: Attend a waterbirth class at [facility]   3rd Wednesday of every month from 7-9 pm (virtual during COVID) [employer] at [email or web address] or [email or web address] or by calling [phone] Bring [place] the certificate from the class to your prenatal appointment or send via MyChart Meet with a midwife at 36 weeks* to see if you can still plan a waterbirth and to sign the consent.   *We also recommend that you schedule as many of your prenatal visits with a midwife as possible.    Helpful information: You may want to bring a bathing suit top to the hospital  to wear during labor but this is optional.  All other supplies are provided by the hospital. Please arrive at the hospital with signs of active labor, and do not wait at home until late in labor. It takes 45 min- 1 hour for fetal monitoring, and check in to your room to take place, plus transport and filling of the waterbirth tub.    Things that would prevent you from having a waterbirth: Premature, <37wks  Previous cesarean birth  Presence of thick meconium-stained fluid  Multiple gestation (Twins, triplets, etc.)  Uncontrolled diabetes or gestational diabetes requiring medication  Hypertension diagnosed in pregnancy or preexisting hypertension (gestational hypertension, preeclampsia, or chronic hypertension) Fetal growth restriction (your baby measures less than 10th percentile on ultrasound) Heavy vaginal bleeding  Non-reassuring fetal heart rate  Active infection (MRSA, etc.). Group B Strep is NOT a contraindication for waterbirth.  If your labor has to be induced and induction method requires continuous monitoring of the baby's heart rate  Other risks/issues identified by your obstetrical provider   Please remember that birth is unpredictable. Under certain unforeseeable circumstances your provider may advise against giving birth in the tub. These decisions will be made on a case-by-case basis and with the safety of you and your baby as our highest priority.    Updated 09/03/21

## 2023-09-22 NOTE — Progress Notes (Signed)
   PRENATAL VISIT NOTE  Subjective:  Jenna Henry is a 28 y.o. G1P0000 at [redacted]w[redacted]d being seen today for ongoing prenatal care.  She is currently monitored for the following issues for this high-risk pregnancy and has Herpes; MDD (major depressive disorder); Hydradenitis; PCOS (polycystic ovarian syndrome); Mild intermittent asthma; Fatty liver; Obesity (BMI 30.0-34.9); Supervision of low-risk pregnancy; Obesity in pregnancy; and Carrier of spinal muscular atrophy on their problem list.  Patient reports no complaints.  Contractions: Not present. Vag. Bleeding: None.  Movement: Present. Denies leaking of fluid.   The following portions of the patient's history were reviewed and updated as appropriate: allergies, current medications, past family history, past medical history, past social history, past surgical history and problem list.   Objective:   Vitals:   09/22/23 1000  BP: 97/62  Pulse: 76  Weight: 191 lb 3.2 oz (86.7 kg)    Fetal Status: Fetal Heart Rate (bpm): 150 Fundal Height: 19 cm Movement: Present     General:  Alert, oriented and cooperative. Patient is in no acute distress.  Skin: Skin is warm and dry. No rash noted.   Cardiovascular: Normal heart rate noted  Respiratory: Normal respiratory effort, no problems with respiration noted  Abdomen: Soft, gravid, appropriate for gestational age.  Pain/Pressure: Absent     Pelvic: Cervical exam deferred        Extremities: Normal range of motion.  Edema: None  Mental Status: Normal mood and affect. Normal behavior. Normal judgment and thought content.   Assessment and Plan:  Pregnancy: G1P0000 at [redacted]w[redacted]d 1. Encounter for supervision of low-risk pregnancy in second trimester (Primary) Up to date Feeling movement Reviewed [facility] care and patient desires program Reviewed care with FM providers and peds  AFP declined today-- wants next visit  Discussed WB today - Pt is interested in waterbirth.  No contraindications at this time per  chart review/patient assessment.   - Pt to enroll in class, see CNMs for most visits in the office.  - Discussed waterbirth as option for low-risk pregnancy.  Reviewed conditions that may arise during pregnancy that will risk pt out of waterbirth including hypertension, diabetes, fetal growth restriction <10%ile, etc.   2. Obesity in pregnancy TWG=7 lb 3.2 oz (3.266 kg) which is in goal for starting BMI  Preterm labor symptoms and general obstetric precautions including but not limited to vaginal bleeding, contractions, leaking of fluid and fetal movement were reviewed in detail with the patient. Please refer to After Visit Summary for other counseling recommendations.   Return in about 4 weeks (around 10/20/2023) for Routine prenatal care, Mom+Baby Combined Care.  Future Appointments  Date Time Provider Department Center  09/27/2023 10:30 AM [facility] GENETIC COUNSELING [facility]  10/20/2023  9:55 AM Ferdie Housekeeper, MD [facility]  10/21/2023 11:00 AM [facility] PROVIDER 1 [facility]  10/21/2023 11:30 AM [facility] US2 [facility]  11/01/2023  8:30 AM Merriam Abbey, DO [facility] None    Abner Ables, MD

## 2023-09-23 NOTE — Progress Notes (Signed)
 Marland Kitchen  mfm

## 2023-09-27 ENCOUNTER — Ambulatory Visit: Attending: Maternal & Fetal Medicine

## 2023-09-27 DIAGNOSIS — Z3A19 19 weeks gestation of pregnancy: Secondary | ICD-10-CM | POA: Diagnosis not present

## 2023-09-27 DIAGNOSIS — O285 Abnormal chromosomal and genetic finding on antenatal screening of mother: Secondary | ICD-10-CM

## 2023-09-27 NOTE — Progress Notes (Addendum)
 [facility] at [facility] [address], [address] Phone:  [phone]   Fax:  [phone]      In-Person Genetic Counseling Clinic Note:   I spoke with 28 y.o. Jenna Henry today to discuss her carrier screening results. She was referred by  Jenna Ables, MD.   Pregnancy History:    G1P0. EGA: [redacted]w[redacted]d by LMP. EDD: 02/15/2024. Reports she takes PNVs, allergy medications, and nausea medications. Personal history of asthma. Denies personal history of diabetes, high blood pressure, thyroid  conditions, and seizures. Denies bleeding, infections, and fevers in this pregnancy. Reports marijuana use during the pregnancy, x2/day. Reports she decreased use since she knew she was pregnant ~[redacted] weeks GA. She also stopped alcohol consumption when she knew she was pregnancy. We discussed that drugs, including marijuana, smoking, and alcohol are not recommended during pregnancy. The effects of marijuana on pregnancies are still largely unknown. While most studies have been reassuring regarding birth defects, others have demonstrated that there may be an increased chance for pregnancy complications, problems with placental function, and temporary postnatal withdrawal symptoms in children exposed to marijuana prenatally. We discussed that we cannot determine the effects of these substances on the fetus prenatally, and reducing/stopping use is recommended.      Family History:    A three-generation pedigree was created and scanned into Epic under the Media tab.  Jenna Henry reports [name] has sickle cell disease and receives treatment at [facility]. She reports all of his children have sickle cell trait, which would be consistent if he had the disease. Jenna Henry had negative carrier screening for sickle cell disease; however, without review of reports it is possible that another hemoglobinopathy may be present and not included on the currently available carrier screening.  Jenna Henry is encouraged to reach out with clarification or test reports if they become available.     Maternal ethnicity reported as Black and paternal ethnicity reported as Black. Denies Ashkenazi Jewish ancestry.  Family history not remarkable for consanguinity, individuals with birth defects, intellectual disability, autism spectrum disorder, multiple spontaneous abortions, still births, or unexplained neonatal death.   Increased Risk to be a Silent Carrier for Spinal Muscular Atrophy (SMA):  Jenna Henry was found to be at an increased risk to be a silent carrier for SMA through carrier screening. Jenna Henry screened to have two functional copies of the SMN1 gene; however, due to limitations of genetic screening the laboratory cannot confirm if Jenna Henry's two functional copies are on the same chromosome or on opposite chromosomes. The laboratory identified the variant [id number] in [place] 's screening, meaning there is increased risk for Jenna Henry 's two copies of the SMN1 gene to be on the same chromosome. Specifically, given Jenna Henry's ethnic background, her risk to be a silent carrier is approximately 1 in 34 (~3%). Please see report for details.  We reviewed the natural history of SMA, the SMN1 genes, and the autosomal recessive mode of inheritance of the condition. We reviewed that if Jenna Henry was a silent SMA carrier, she would either pass down the chromosome with both copies of the SMN1 gene OR the chromosome with the deletions. If Jenna Henry 's reproductive partner is found to also be a carrier for SMA, there would be a 25% risk for the current pregnancy and all future pregnancies the couple has together to be affected (have zero functional copies of the SMN1 gene).  Given Jenna Henry's carrier screening results, we discussed and offered carrier screening for her reproductive  partner Jenna Henry. We reviewed the benefits and limitations of carrier screening and that it can detect most but not all carriers. We dicussed that if both members of  a couple are carriers for the same condition, prenatal diagnostic testing is avaliable to determine if the pregnancy is affected. We briefly reviewed the benefits and limitations of diagnostic testing including the 1 in 500 increased risk for miscarriage from the procedure. If diagnostic testing via CVS/amniocentesis is not desired, SMA testing can be completed after birth and is included in [name] 's Newborn Screening Program.   She declined at this time, as he reportedly previously declined screening for himself. We discussed other options, including prenatal diagnosis through amniocentesis, UNITY single gene NIPS, and postnatal testing. The benefits, risks, and limitations of each were reviewed. We reviewed that the [place]  Newborn Screening (NBS) program will screen all newborn babies for cystic fibrosis, spinal muscular atrophy, hemoglobinopathies, and numerous other conditions. Jenna Henry declined UNITY single gene NIPS and amniocentesis. She elected to wait for the NBS results and for a referral to pediatric genetics if needed.  In the meantime, we calculated the chance that this couple's pregnancy would be affected with SMA based on Symphany's screening results and [name]'s ethnicity. This would be a 1 in 8,976 (~0.01%) chance that this couple's pregnancy would be affected. Carrier screening for Jenna Henry will allow us  to provide more accurate risk.  Of note, Jenna Henry was not found to be a carrier for the other three conditions screened for. This significantly reduces but does not eliminate the chance of being a carrier. Please see report for details.   Previous Testing Completed:  Low risk NIPS: Jenna Henry previously completed noninvasive prenatal screening (NIPS) in this pregnancy. The result is low risk, consistent with a female fetus. This screening significantly reduces but does not eliminate the chance that the current pregnancy has Down syndrome (trisomy 55), trisomy 14, trisomy 70, common sex  chromosome conditions, and 22q11.2 microdeletion syndrome. Please see report for details. There are many genetic conditions that cannot be detected by NIPS.    Plan of Care:   Yee declined [name] carrier screening for SMA. Declined UNITY single gene NIPS and amniocentesis. Newborn screening (NBS) for SMA. Jenna Henry will have her msAFP screening at her next OB appointment on 10/20/2023.   Informed consent was obtained. All questions were answered.   60 minutes were spent on the date of the encounter in service to the patient including preparation, face-to-face consultation, discussion of test reports and available next steps, pedigree construction, genetic risk assessment, documentation, and care coordination.    Thank you for sharing in the care of Jenna Henry with us .  Please do not hesitate to contact us  at [phone] if you have any questions.   Jenna Kindle, MS, [facility] Certified Genetic Counselor   Genetic counseling student involved in appointment: No.  I provided general supervision for this patient and was immediately available for any patient care concerns. [employer]

## 2023-10-20 ENCOUNTER — Other Ambulatory Visit: Payer: Self-pay

## 2023-10-20 ENCOUNTER — Ambulatory Visit: Admitting: Family Medicine

## 2023-10-20 VITALS — BP 101/68 | HR 82 | Wt 193.4 lb

## 2023-10-20 DIAGNOSIS — O99210 Obesity complicating pregnancy, unspecified trimester: Secondary | ICD-10-CM

## 2023-10-20 DIAGNOSIS — Z3A23 23 weeks gestation of pregnancy: Secondary | ICD-10-CM

## 2023-10-20 DIAGNOSIS — O99212 Obesity complicating pregnancy, second trimester: Secondary | ICD-10-CM

## 2023-10-20 DIAGNOSIS — O2642 Herpes gestationis, second trimester: Secondary | ICD-10-CM

## 2023-10-20 DIAGNOSIS — E669 Obesity, unspecified: Secondary | ICD-10-CM

## 2023-10-20 DIAGNOSIS — B009 Herpesviral infection, unspecified: Secondary | ICD-10-CM

## 2023-10-20 DIAGNOSIS — Z3492 Encounter for supervision of normal pregnancy, unspecified, second trimester: Secondary | ICD-10-CM

## 2023-10-20 NOTE — Progress Notes (Signed)
   PRENATAL VISIT NOTE  Subjective:  Jenna Henry is a 28 y.o. G1P0000 at [redacted]w[redacted]d being seen today for ongoing prenatal care.  She is currently monitored for the following issues for this low-risk pregnancy and has Herpes; MDD (major depressive disorder); Hydradenitis; PCOS (polycystic ovarian syndrome); Mild intermittent asthma; Fatty liver; Obesity (BMI 30.0-34.9); Supervision of low-risk pregnancy; Obesity in pregnancy; and Carrier of spinal muscular atrophy on their problem list.  Patient reports no bleeding, no contractions, no cramping, and no leaking.  Contractions: Not present. Vag. Bleeding: None.  Movement: Present. Denies leaking of fluid.   The following portions of the patient's history were reviewed and updated as appropriate: allergies, current medications, past family history, past medical history, past social history, past surgical history and problem list.   Objective:    Vitals:   10/20/23 1015  BP: 101/68  Pulse: 82  Weight: 193 lb 6.4 oz (87.7 kg)    Fetal Status:  Fetal Heart Rate (bpm): 142   Movement: Present    General: Alert, oriented and cooperative. Patient is in no acute distress.  Skin: Skin is warm and dry. No rash noted.   Cardiovascular: Normal heart rate noted  Respiratory: Normal respiratory effort, no problems with respiration noted  Abdomen: Soft, gravid, appropriate for gestational age.  Pain/Pressure: Absent     Pelvic: Cervical exam deferred        Extremities: Normal range of motion.  Edema: Trace  Mental Status: Normal mood and affect. Normal behavior. Normal judgment and thought content.   Assessment and Plan:  Pregnancy: G1P0000 at [redacted]w[redacted]d 1. Encounter for supervision of low-risk pregnancy in second trimester (Primary) FHR and BP appropriate today 28-week labs at next visit in 4 weeks - AFP, Serum, Open Spina Bifida  2. Obesity in pregnancy Growth ultrasound scheduled for tomorrow  3. Herpes Patient reports diagnosis in 2018 at the  health department.  No recent outbreaks.  Will need prophylaxis starting at between 34 and 36 weeks  4. [redacted] weeks gestation of pregnancy - AFP, Serum, Open Spina Bifida  Preterm labor symptoms and general obstetric precautions including but not limited to vaginal bleeding, contractions, leaking of fluid and fetal movement were reviewed in detail with the patient. Please refer to After Visit Summary for other counseling recommendations.   No follow-ups on file.  Future Appointments  Date Time Provider Department Center  10/21/2023 11:00 AM [facility] PROVIDER 1 [facility]  10/21/2023 11:30 AM [facility] US2 [facility]  11/01/2023  8:30 AM Merriam Abbey, DO [facility] None  11/17/2023  8:20 AM [facility] LAB [facility]  11/17/2023  8:55 AM Fawaz Borquez, Mardee Shackle, MD [facility]    Ferdie Housekeeper, MD

## 2023-10-21 ENCOUNTER — Ambulatory Visit: Attending: Maternal & Fetal Medicine | Admitting: Maternal & Fetal Medicine

## 2023-10-21 ENCOUNTER — Ambulatory Visit (HOSPITAL_BASED_OUTPATIENT_CLINIC_OR_DEPARTMENT_OTHER)

## 2023-10-21 DIAGNOSIS — Z148 Genetic carrier of other disease: Secondary | ICD-10-CM | POA: Diagnosis not present

## 2023-10-21 DIAGNOSIS — Z3A23 23 weeks gestation of pregnancy: Secondary | ICD-10-CM | POA: Diagnosis not present

## 2023-10-21 DIAGNOSIS — O99212 Obesity complicating pregnancy, second trimester: Secondary | ICD-10-CM | POA: Diagnosis not present

## 2023-10-21 DIAGNOSIS — E669 Obesity, unspecified: Secondary | ICD-10-CM

## 2023-10-21 DIAGNOSIS — Z362 Encounter for other antenatal screening follow-up: Secondary | ICD-10-CM | POA: Insufficient documentation

## 2023-10-21 DIAGNOSIS — Z363 Encounter for antenatal screening for malformations: Secondary | ICD-10-CM | POA: Insufficient documentation

## 2023-10-21 NOTE — Progress Notes (Signed)
 After review, MFM consult with provider is not indicated for today  Penney Bowling, DO 10/21/2023 3:57 PM  [facility]

## 2023-10-22 LAB — AFP, SERUM, OPEN SPINA BIFIDA
AFP MoM: 1.39
AFP Value: 110.5 ng/mL
Gest. Age on Collection Date: 23 wk
Maternal Age At EDD: 26.1 a
OSBR Risk 1 IN: 7407
Test Results:: NEGATIVE
Weight: 193 [lb_av]
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
## 2023-10-31 ENCOUNTER — Telehealth: Admitting: Family

## 2023-10-31 DIAGNOSIS — Z3490 Encounter for supervision of normal pregnancy, unspecified, unspecified trimester: Secondary | ICD-10-CM

## 2023-10-31 DIAGNOSIS — N898 Other specified noninflammatory disorders of vagina: Secondary | ICD-10-CM

## 2023-10-31 NOTE — Progress Notes (Signed)
  Because you are having vaginal symptoms and are pregnant, I feel your condition warrants further evaluation and I recommend that you be seen in a face-to-face visit.   NOTE: There will be NO CHARGE for this E-Visit   If you are having a true medical emergency, please call 911.     For an urgent face to face visit, [place] has multiple urgent care centers for your convenience.  Click the link below for the full list of locations and hours, walk-in wait times, appointment scheduling options and driving directions:  Urgent Care - [place], [place], [place], [place], [place], [place]  [place]     Your MyChart E-visit questionnaire answers were reviewed by a board certified advanced clinical practitioner to complete your personal care plan based on your specific symptoms.    Thank you for using e-Visits.

## 2023-11-01 ENCOUNTER — Encounter: Payer: Self-pay | Admitting: Neurology

## 2023-11-01 ENCOUNTER — Ambulatory Visit: Payer: Medicaid Other | Admitting: Neurology

## 2023-11-01 ENCOUNTER — Ambulatory Visit: Payer: Self-pay

## 2023-11-17 ENCOUNTER — Encounter: Admitting: Family Medicine

## 2023-11-17 ENCOUNTER — Other Ambulatory Visit

## 2023-11-18 ENCOUNTER — Other Ambulatory Visit: Payer: Self-pay

## 2023-11-18 DIAGNOSIS — Z3490 Encounter for supervision of normal pregnancy, unspecified, unspecified trimester: Secondary | ICD-10-CM

## 2023-11-23 NOTE — Progress Notes (Unsigned)
 PRENATAL VISIT NOTE  Subjective:  Jenna Henry is a 28 y.o. G1P0000 at [redacted]w[redacted]d being seen today for ongoing prenatal care.  She is currently monitored for the following issues for this high-risk pregnancy and has Herpes; MDD (major depressive disorder); Hydradenitis; PCOS (polycystic ovarian syndrome); Mild intermittent asthma; Fatty liver; Obesity (BMI 30.0-34.9); Supervision of low-risk pregnancy; Obesity in pregnancy; and Carrier of spinal muscular atrophy on their problem list.  Patient reports heartburn and nausea.  Contractions: Not present. Vag. Bleeding: None.  Movement: Present. Denies leaking of fluid.   The following portions of the patient's history were reviewed and updated as appropriate: allergies, current medications, past family history, past medical history, past social history, past surgical history and problem list.   Objective:    Vitals:   11/24/23 0902  BP: 120/66  Pulse: 92  Weight: 193 lb 4 oz (87.7 kg)    Fetal Status:  Fetal Heart Rate (bpm): 140   Movement: Present    General: Alert, oriented and cooperative. Patient is in no acute distress.  Skin: Skin is warm and dry. No rash noted.   Cardiovascular: Normal heart rate noted  Respiratory: Normal respiratory effort, no problems with respiration noted  Abdomen: Soft, gravid, appropriate for gestational age.  Pain/Pressure: Present (round ligament pain)     Pelvic: Cervical exam deferred        Extremities: Normal range of motion.  Edema: Trace  Mental Status: Normal mood and affect. Normal behavior. Normal judgment and thought content.   Assessment and Plan:  Pregnancy: G1P0000 at [redacted]w[redacted]d 1. Encounter for supervision of low-risk pregnancy in third trimester (Primary) Up to date Completing 3rd trimester labs today Rh positive,  rhogam indicated- No Offered Tdap and did notaccept today.  Plan to get next visit FH appropriate Vigorous movement.  Concerns today:  - Not leaking BM- reviewed normality of  this and lactation team - Left carpal tunnel/left thumb pain-- present in AM improves through day. Possible deQuervain tendonitis vs carpal tunnel. Recommend bracing at night to help sx - GERD- using THC to help nausea and discussed this is likely more related to gerd. Endorsed burning in throat. Recommend starting famotidine  - THC use- has reduce use in pregnancy but like above has used for nausea. She also reports use of sleeping. Recommended use of hydroxyzine  instead. Reviewed lack of data for safety of THC in pregnancy and unable to quantify effects on infant.   Continues to plan WB-- reviewed taking class and attached info to AS. DIsucssed she can also google [facility] Childbirth education and find class. Needs to take WB class and discussed also taking Natural coping class - Pt is interested in waterbirth.  No contraindications at this time per chart review/patient assessment.   - Pt to enroll in class, see CNMs for most visits in the office.  - Discussed waterbirth as option for low-risk pregnancy.  Reviewed conditions that may arise during pregnancy that will risk pt out of waterbirth including hypertension, diabetes, fetal growth restriction <10%ile, etc.  2. Obesity in pregnancy TWG=9 lb 4 oz (4.196 kg) which is at goal  3. Herpes Lesion location on genitals, no recent outbreaks Will need PPX at 34 weeks  Preterm labor symptoms and general obstetric precautions including but not limited to vaginal bleeding, contractions, leaking of fluid and fetal movement were reviewed in detail with the patient. Please refer to After Visit Summary for other counseling recommendations.   Return in about 2 weeks (around 12/08/2023) for Routine prenatal care,  Mom+Baby Combined Care.  Future Appointments  Date Time Provider Department Center  12/15/2023  1:35 PM Eldonna Suzen Octave, MD [facility]  12/29/2023  8:55 AM Ilean [name], MD [facility]  01/12/2024 10:35 AM Ilean [name], MD [facility]   01/17/2024 10:35 AM Eldonna Suzen Octave, MD [facility]    Suzen Octave Eldonna, MD

## 2023-11-24 ENCOUNTER — Ambulatory Visit (INDEPENDENT_AMBULATORY_CARE_PROVIDER_SITE_OTHER): Admitting: Family Medicine

## 2023-11-24 ENCOUNTER — Other Ambulatory Visit: Payer: Self-pay

## 2023-11-24 ENCOUNTER — Other Ambulatory Visit

## 2023-11-24 VITALS — BP 120/66 | HR 92 | Wt 193.2 lb

## 2023-11-24 DIAGNOSIS — Z3490 Encounter for supervision of normal pregnancy, unspecified, unspecified trimester: Secondary | ICD-10-CM

## 2023-11-24 DIAGNOSIS — Z3A28 28 weeks gestation of pregnancy: Secondary | ICD-10-CM

## 2023-11-24 DIAGNOSIS — Z3493 Encounter for supervision of normal pregnancy, unspecified, third trimester: Secondary | ICD-10-CM

## 2023-11-24 DIAGNOSIS — O99210 Obesity complicating pregnancy, unspecified trimester: Secondary | ICD-10-CM

## 2023-11-24 DIAGNOSIS — O99613 Diseases of the digestive system complicating pregnancy, third trimester: Secondary | ICD-10-CM

## 2023-11-24 DIAGNOSIS — K219 Gastro-esophageal reflux disease without esophagitis: Secondary | ICD-10-CM

## 2023-11-24 DIAGNOSIS — O2643 Herpes gestationis, third trimester: Secondary | ICD-10-CM

## 2023-11-24 DIAGNOSIS — E669 Obesity, unspecified: Secondary | ICD-10-CM

## 2023-11-24 DIAGNOSIS — B009 Herpesviral infection, unspecified: Secondary | ICD-10-CM

## 2023-11-24 DIAGNOSIS — O99213 Obesity complicating pregnancy, third trimester: Secondary | ICD-10-CM

## 2023-11-24 MED ORDER — FAMOTIDINE 20 MG PO TABS: 20.0000 mg | ORAL_TABLET | Freq: Two times a day (BID) | ORAL | 6 refills | Status: DC

## 2023-11-24 MED ORDER — HYDROXYZINE HCL 25 MG PO TABS: 25.0000 mg | ORAL_TABLET | Freq: Four times a day (QID) | ORAL | 2 refills | Status: DC | PRN

## 2023-11-24 NOTE — Patient Instructions (Signed)
   Considering Waterbirth? Guide for patients at [facility]) Why consider waterbirth? Gentle birth for babies  Less pain medicine used in labor  May allow for passive descent/less pushing  May reduce perineal tears  More mobility and instinctive maternal position changes  Increased maternal relaxation   Is waterbirth safe? What are the risks of infection, drowning or other complications? Infection:  Very low risk (3.7 % for tub vs 4.8% for bed)  7 in 8000 waterbirths with documented infection  Poorly cleaned equipment most common cause  Slightly lower group B strep transmission rate  Drowning  Maternal:  Very low risk  Related to seizures or fainting  Newborn:  Very low risk. No evidence of increased risk of respiratory problems in multiple large studies  Physiological protection from breathing under water  Avoid underwater birth if there are any fetal complications  Once baby's head is out of the water, keep it out.  Birth complication  Some reports of cord trauma, but risk decreased by bringing baby to surface gradually  No evidence of increased risk of shoulder dystocia. Mothers can usually change positions faster in water than in a bed, possibly aiding the maneuvers to free the shoulder.   There are 2 things you MUST do to have a waterbirth with [facility]: Attend a waterbirth class at [facility]   3rd Wednesday of every month from 7-9 pm (virtual during COVID) [employer] at [email or web address] or [email or web address] or by calling [phone] Bring [place] the certificate from the class to your prenatal appointment or send via MyChart Meet with a midwife at 36 weeks* to see if you can still plan a waterbirth and to sign the consent.   *We also recommend that you schedule as many of your prenatal visits with a midwife as possible.    Helpful information: You may want to bring a bathing suit top to the hospital  to wear during labor but this is optional.  All other supplies are provided by the hospital. Please arrive at the hospital with signs of active labor, and do not wait at home until late in labor. It takes 45 min- 1 hour for fetal monitoring, and check in to your room to take place, plus transport and filling of the waterbirth tub.    Things that would prevent you from having a waterbirth: Premature, <37wks  Previous cesarean birth  Presence of thick meconium-stained fluid  Multiple gestation (Twins, triplets, etc.)  Uncontrolled diabetes or gestational diabetes requiring medication  Hypertension diagnosed in pregnancy or preexisting hypertension (gestational hypertension, preeclampsia, or chronic hypertension) Fetal growth restriction (your baby measures less than 10th percentile on ultrasound) Heavy vaginal bleeding  Non-reassuring fetal heart rate  Active infection (MRSA, etc.). Group B Strep is NOT a contraindication for waterbirth.  If your labor has to be induced and induction method requires continuous monitoring of the baby's heart rate  Other risks/issues identified by your obstetrical provider   Please remember that birth is unpredictable. Under certain unforeseeable circumstances your provider may advise against giving birth in the tub. These decisions will be made on a case-by-case basis and with the safety of you and your baby as our highest priority.    Updated 09/03/21

## 2023-11-25 ENCOUNTER — Ambulatory Visit: Payer: Self-pay

## 2023-11-25 LAB — HIV ANTIBODY (ROUTINE TESTING W REFLEX): HIV Screen 4th Generation wRfx: NONREACTIVE

## 2023-11-25 LAB — GLUCOSE TOLERANCE, 2 HOURS W/ 1HR
Glucose, 1 hour: 117 mg/dL (ref 70–179)
Glucose, 2 hour: 90 mg/dL (ref 70–152)
Glucose, Fasting: 79 mg/dL (ref 70–91)

## 2023-11-25 LAB — CBC
Hematocrit: 33.8 % — ABNORMAL LOW (ref 34.0–46.6)
Hemoglobin: 10.8 g/dL — ABNORMAL LOW (ref 11.1–15.9)
MCH: 31.4 pg (ref 26.6–33.0)
MCHC: 32 g/dL (ref 31.5–35.7)
MCV: 98 fL — ABNORMAL HIGH (ref 79–97)
Platelets: 209 10*3/uL (ref 150–450)
RBC: 3.44 x10E6/uL — ABNORMAL LOW (ref 3.77–5.28)
RDW: 14.1 % (ref 11.7–15.4)
WBC: 6.5 10*3/uL (ref 3.4–10.8)

## 2023-11-25 LAB — RPR: RPR Ser Ql: NONREACTIVE

## 2023-12-12 ENCOUNTER — Inpatient Hospital Stay (HOSPITAL_COMMUNITY)
Admission: AD | Admit: 2023-12-12 | Discharge: 2023-12-12 | Disposition: A | Discharge status: Home / Self Care | Attending: Obstetrics and Gynecology | Admitting: Obstetrics and Gynecology

## 2023-12-12 ENCOUNTER — Other Ambulatory Visit: Payer: Self-pay

## 2023-12-12 ENCOUNTER — Inpatient Hospital Stay (HOSPITAL_COMMUNITY)

## 2023-12-12 ENCOUNTER — Encounter (HOSPITAL_COMMUNITY): Payer: Self-pay | Admitting: Obstetrics and Gynecology

## 2023-12-12 DIAGNOSIS — R55 Syncope and collapse: Secondary | ICD-10-CM | POA: Diagnosis not present

## 2023-12-12 DIAGNOSIS — O9A213 Injury, poisoning and certain other consequences of external causes complicating pregnancy, third trimester: Secondary | ICD-10-CM | POA: Diagnosis present

## 2023-12-12 DIAGNOSIS — Z3A30 30 weeks gestation of pregnancy: Secondary | ICD-10-CM

## 2023-12-12 DIAGNOSIS — H1032 Unspecified acute conjunctivitis, left eye: Secondary | ICD-10-CM

## 2023-12-12 DIAGNOSIS — S0990XA Unspecified injury of head, initial encounter: Secondary | ICD-10-CM | POA: Insufficient documentation

## 2023-12-12 DIAGNOSIS — W1839XA Other fall on same level, initial encounter: Secondary | ICD-10-CM | POA: Diagnosis not present

## 2023-12-12 DIAGNOSIS — R9431 Abnormal electrocardiogram [ECG] [EKG]: Secondary | ICD-10-CM | POA: Diagnosis not present

## 2023-12-12 DIAGNOSIS — O26893 Other specified pregnancy related conditions, third trimester: Secondary | ICD-10-CM

## 2023-12-12 LAB — COMPREHENSIVE METABOLIC PANEL WITH GFR
ALT: 11 U/L (ref 0–44)
AST: 25 U/L (ref 15–41)
Albumin: 2.5 g/dL — ABNORMAL LOW (ref 3.5–5.0)
Alkaline Phosphatase: 65 U/L (ref 38–126)
Anion gap: 6 (ref 5–15)
BUN: <5 mg/dL — ABNORMAL LOW (ref 6–20)
CO2: 23 mmol/L (ref 22–32)
Calcium: 10.4 mg/dL — ABNORMAL HIGH (ref 8.9–10.3)
Chloride: 109 mmol/L (ref 98–111)
Creatinine, Ser: 0.43 mg/dL — ABNORMAL LOW (ref 0.44–1.00)
GFR, Estimated: >60 mL/min (ref >60)
Glucose, Bld: 90 mg/dL (ref 70–99)
Potassium: 3.2 mmol/L — ABNORMAL LOW (ref 3.5–5.1)
Sodium: 138 mmol/L (ref 135–145)
Total Bilirubin: 0.2 mg/dL (ref 0.0–1.2)
Total Protein: 4.8 g/dL — ABNORMAL LOW (ref 6.5–8.1)

## 2023-12-12 LAB — URINALYSIS, ROUTINE W REFLEX MICROSCOPIC
Bilirubin Urine: NEGATIVE
Glucose, UA: NEGATIVE mg/dL
Hgb urine dipstick: NEGATIVE
Ketones, ur: NEGATIVE mg/dL
Leukocytes,Ua: NEGATIVE
Nitrite: NEGATIVE
Protein, ur: 30 mg/dL — AB
Specific Gravity, Urine: 1.009 (ref 1.005–1.030)
pH: 6 (ref 5.0–8.0)

## 2023-12-12 LAB — CBC
HCT: 31.2 % — ABNORMAL LOW (ref 36.0–46.0)
Hemoglobin: 10.4 g/dL — ABNORMAL LOW (ref 12.0–15.0)
MCH: 31.5 pg (ref 26.0–34.0)
MCHC: 33.3 g/dL (ref 30.0–36.0)
MCV: 94.5 fL (ref 80.0–100.0)
Platelets: 256 K/uL (ref 150–400)
RBC: 3.3 MIL/uL — ABNORMAL LOW (ref 3.87–5.11)
RDW: 13.8 % (ref 11.5–15.5)
WBC: 10.7 K/uL — ABNORMAL HIGH (ref 4.0–10.5)
nRBC: 0 % (ref 0.0–0.2)

## 2023-12-12 MED ORDER — ERYTHROMYCIN 5 MG/GM OP OINT: TOPICAL_OINTMENT | OPHTHALMIC | 0 refills | Status: DC

## 2023-12-12 MED ORDER — CYCLOBENZAPRINE HCL 5 MG PO TABS: 10.0000 mg | ORAL_TABLET | Freq: Once | ORAL | Status: DC

## 2023-12-12 MED ORDER — ERYTHROMYCIN 5 MG/GM OP OINT
TOPICAL_OINTMENT | OPHTHALMIC | 0 refills | Status: DC
Start: 2023-12-12 — End: 2023-12-24

## 2023-12-12 NOTE — MAU Provider Note (Cosign Needed Addendum)
 History     CSN: [id number]  Arrival date and time: 12/12/23 1453   Event Date/Time   First Provider Initiated Contact with Patient 12/12/23 1658      Chief Complaint  Patient presents with   Jenna Henry backwards     Loss of Consciousness    [name] is a 28 y.o. year old G27P0000 female at [redacted]w[redacted]d weeks gestation who presents to MAU after an episode of LOC and a subsequent fall with head injury. The patient reports that earlier today while outside she began to feel hot and short of breath, then suddenly lost consciousness and fell backwards. She hit the back of her head, and according to her mother she began shaking. When she regained consciousness she felt nauseous and the back of her head was aching. She denied being dehydrated, noting she drinks lots of fluids. Her head continues to ache at the back of her head w/o TTP. 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 She has received extensive workup from cardiology and neurology in the past for these episodes w/out an identifiable cause. The patient reports she was diagnosed with arrhythmia when she was 88-9 years old. She endorses +FM, and denies bleeding or leakage of fluids. She has an upcoming OB appointment on 12/15/2023.   OB History     Gravida  1   Para  0   Term  0   Preterm  0   AB  0   Living  0      SAB  0   IAB  0   Ectopic  0   Multiple  0   Live Births  0           Past Medical History:  Diagnosis Date   Anxiety    Anxiety state 02/28/2017   Asthma    Chest pain 11/09/2017   Depression    Herpes    Hydradenitis    Insomnia 02/28/2017   Irregular menstruation 07/06/2016   Laceration of left thumb 03/10/2022   Pain in finger of left hand 02/12/2023   Palpitations 11/09/2017   PCOS (polycystic ovarian syndrome)    Polycystic ovaries     Past Surgical History:  Procedure Laterality Date   ANKLE RECONSTRUCTION Right 10/28/2021    Procedure: RIGHT ANTERIOR TALOFIBULAR LIGAMENT REPAIR AND SYNDESMOSIS FIXATION;  Surgeon: Harden Jerona GAILS, MD;  Location: [facility];  Service: Orthopedics;  Laterality: Right;  regional block   CYSTECTOMY     axillary area per patient    Family History  Problem Relation Age of Onset   Depression Mother    Arthritis Mother    Hypertension Mother    Anxiety disorder Mother    Supraventricular tachycardia Mother    Heart murmur Father    Hyperlipidemia Maternal Grandmother    Hypertension Maternal Grandfather    Heart disease Maternal Grandfather    Diabetes Maternal Grandfather    COPD Maternal Grandfather    Heart attack Maternal Grandfather    Colon cancer Neg Hx    Esophageal cancer Neg Hx    Rectal cancer Neg Hx    Stomach cancer Neg Hx     Social History   Tobacco Use   Smoking status: Former    Types: Cigarettes   Smokeless tobacco: Never   Tobacco comments:    Vaping    04/08/2023 Patient smokes marijuana daily  Vaping Use   Vaping status: Former  Substances: Nicotine , Flavoring   Devices: stopped when pregnant  Substance Use Topics   Alcohol use: Not Currently    Alcohol/week: 2.0 standard drinks of alcohol    Types: 2 Cans of beer per week    Comment: occassional -holidays   Drug use: Yes    Types: Marijuana    Comment: 07/20/23 trying to wean off, once a day instead of 3-4    Allergies:  Allergies  Allergen Reactions   Tylenol  [Acetaminophen ] Other (See Comments)    History of Suicide attempt using tylenol . Request to avoid and not offer.    Ortho Tri-Cyclen [Norgestimate -Eth Estradiol ] Hives, Itching, Rash and Other (See Comments)    Caused burning all over    Medications Prior to Admission  Medication Sig Dispense Refill Last Dose/Taking   albuterol  (VENTOLIN  HFA) 108 (90 Base) MCG/ACT inhaler Inhale 2 puffs into the lungs every 4 (four) hours as needed for wheezing or shortness of breath. (Must have office visit for refills) 6.7 g  2 12/12/2023 at  2:27 PM   Prenatal 27-1 MG TABS Take 1 tablet by mouth daily. 30 tablet 11 12/11/2023 at 11:00 AM   Blood Pressure Monitoring (BLOOD PRESSURE KIT) DEVI 1 Device by Does not apply route as needed. 1 each 0    Doxylamine -Pyridoxine  10-10 MG TBEC Take 2 tablets by mouth at bedtime. (Patient not taking: Reported on 11/24/2023) 60 tablet 2    famotidine  (PEPCID ) 20 MG tablet Take 1 tablet (20 mg total) by mouth 2 (two) times daily. 60 tablet 6 Unknown   hydrOXYzine  (ATARAX ) 25 MG tablet Take 1 tablet (25 mg total) by mouth every 6 (six) hours as needed for anxiety (May take to support sleep). 30 tablet 2 Unknown   Misc. Devices (GOJJI WEIGHT SCALE) MISC 1 Device by Does not apply route once a week. 1 each 0    valACYclovir  (VALTREX ) 1000 MG tablet Take 1 tablet (1,000 mg total) by mouth 2 (two) times daily. (Patient not taking: Reported on 11/24/2023) 14 tablet 2     Review of Systems  Constitutional: Negative.   HENT: Negative.    Respiratory: Negative.    Cardiovascular: Negative.   Gastrointestinal: Negative.   Genitourinary: Negative.   Neurological:  Positive for headaches.   Physical Exam   Blood pressure (!) 114/53, pulse 83, temperature 98.6 F (37 C), temperature source Oral, resp. rate 16, height 5' 5 (1.651 m), weight 87.1 kg, last menstrual period 05/11/2023, SpO2 99%.  FHT by doppler: 140  Physical Exam Vitals and nursing note reviewed.  Constitutional:      Appearance: Normal appearance.  HENT:     Head: Normocephalic and atraumatic.     Comments: No TTP Eyes:     Conjunctiva/sclera: Conjunctivae normal.  Cardiovascular:     Rate and Rhythm: Normal rate.  Pulmonary:     Effort: Pulmonary effort is normal.  Musculoskeletal:     Cervical back: Normal range of motion.  Skin:    General: Skin is warm and dry.  Neurological:     General: No focal deficit present.     Mental Status: She is alert.     MAU Course  Procedures  MDM CT head w/o  contrast EKG CBC UA Results for orders placed or performed during the hospital encounter of 12/12/23 (from the past 24 hours)  Urinalysis, Routine w reflex microscopic -Urine, Clean Catch     Status: Abnormal   Collection Time: 12/12/23  4:16 PM  Result Value Ref Range  Color, Urine YELLOW YELLOW   APPearance HAZY (A) CLEAR   Specific Gravity, Urine 1.009 1.005 - 1.030   pH 6.0 5.0 - 8.0   Glucose, UA NEGATIVE NEGATIVE mg/dL   Hgb urine dipstick NEGATIVE NEGATIVE   Bilirubin Urine NEGATIVE NEGATIVE   Ketones, ur NEGATIVE NEGATIVE mg/dL   Protein, ur 30 (A) NEGATIVE mg/dL   Nitrite NEGATIVE NEGATIVE   Leukocytes,Ua NEGATIVE NEGATIVE   RBC / HPF 0-5 0 - 5 RBC/hpf   WBC, UA 0-5 0 - 5 WBC/hpf   Bacteria, UA RARE (A) NONE SEEN   Squamous Epithelial / HPF 6-10 0 - 5 /HPF   Mucus PRESENT   CBC     Status: Abnormal   Collection Time: 12/12/23  5:24 PM  Result Value Ref Range   WBC 10.7 (H) 4.0 - 10.5 K/uL   RBC 3.30 (L) 3.87 - 5.11 MIL/uL   Hemoglobin 10.4 (L) 12.0 - 15.0 g/dL   HCT 68.7 (L) 63.9 - 53.9 %   MCV 94.5 80.0 - 100.0 fL   MCH 31.5 26.0 - 34.0 pg   MCHC 33.3 30.0 - 36.0 g/dL   RDW 86.1 88.4 - 84.4 %   Platelets 256 150 - 400 K/uL   nRBC 0.0 0.0 - 0.2 %  Comprehensive metabolic panel with GFR     Status: Abnormal   Collection Time: 12/12/23  5:24 PM  Result Value Ref Range   Sodium 138 135 - 145 mmol/L   Potassium 3.2 (L) 3.5 - 5.1 mmol/L   Chloride 109 98 - 111 mmol/L   CO2 23 22 - 32 mmol/L   Glucose, Bld 90 70 - 99 mg/dL   BUN <5 (L) 6 - 20 mg/dL   Creatinine, Ser 9.56 (L) 0.44 - 1.00 mg/dL   Calcium 89.5 (H) 8.9 - 10.3 mg/dL   Total Protein 4.8 (L) 6.5 - 8.1 g/dL   Albumin 2.5 (L) 3.5 - 5.0 g/dL   AST 25 15 - 41 U/L   ALT 11 0 - 44 U/L   Alkaline Phosphatase 65 38 - 126 U/L   Total Bilirubin 0.2 0.0 - 1.2 mg/dL   GFR, Estimated >39 >39 mL/min   Anion gap 6 5 - 15    No results found.  Assessment and Plan   Syncopal episode during pregnancy w/ head  injury   2. [redacted] weeks gestation    This is a Psychologist, occupational Note.     LOS: 0 days   Gaines, Megan J, Medical Student  Attestation of Supervision of Student:  I confirm that I have verified the information documented in the medical student's note and that I have also personally reperformed the history, physical exam and all medical decision making activities.  I have verified that all services and findings are accurately documented in this student's note; and I agree with management and plan as outlined in the documentation. I have also made any necessary editorial changes.  Report given to and care assumed by Arlene Sor, MD at 2005.  Rolitta Dawson, CNM Center for [employer], [facility] 12/12/2023 8:02 PM    ADDENDUM: 2145 Care of patient taken over from [place]. Pt's CT head back, reporting zero pain at this time. 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 Has been evaluated by cardiology and neurology. She is due for MRI brain, neurology  in their note mentioned that it would be ok to hold off while pregnant. I did offer MRI brain and/or neurology consult to pt today and she declined, stating she would prefer to be discharged home. She confirms that her fall was witnessed, no injury/trauma to abdomen, leaking of fluid, bleeding, contractions. Reports normal FM. I reperformed a neurologic exam -- EOMI, PERRL, CN II-XII intact, strength and sensation intact as was coordination. In light of known hx recurrent bouts, I highly recommended patient f/up with neurology, would also consider autonomic testing. She has f/up with her OB in 3 days. Return precautions and low threshold to return discussed w pt. Discharged in stable condition. Also incidentally had acute bacterial conjuntivitis of left eye, so abx ointment sent for  that.    Alain Sor, MD OB Fellow, Faculty Practice [facility], Center for [facility]

## 2023-12-12 NOTE — MAU Note (Signed)
 Jenna Henry is a 28 y.o. at [redacted]w[redacted]d here in MAU reporting:Via EMS arrival. Clemens backwards at home during a syncope episode Feels fetal movement, no visual disturbances , no upper right quadrant pain, no vaginal bleeding, no leaking of fluid, no contractions.Pt endorses headache due to fall possibly hitting head.  LMP:  Onset of complaint: 1430 Pain score: 6 Vitals:   12/12/23 1458 12/12/23 1500  BP: 113/72   Pulse: 91   Resp: 16   Temp: 98.6 F (37 C)   SpO2:  98%     FHT: 140  Lab orders placed from triage:

## 2023-12-15 ENCOUNTER — Encounter: Admitting: Family Medicine

## 2023-12-24 ENCOUNTER — Inpatient Hospital Stay (HOSPITAL_COMMUNITY)
Admission: AD | Admit: 2023-12-24 | Discharge: 2023-12-24 | Disposition: A | Discharge status: Home / Self Care | Payer: Self-pay | Attending: Obstetrics & Gynecology | Admitting: Obstetrics & Gynecology

## 2023-12-24 ENCOUNTER — Encounter (HOSPITAL_COMMUNITY): Payer: Self-pay | Admitting: Obstetrics & Gynecology

## 2023-12-24 DIAGNOSIS — Z3A32 32 weeks gestation of pregnancy: Secondary | ICD-10-CM | POA: Diagnosis not present

## 2023-12-24 DIAGNOSIS — N750 Cyst of Bartholin's gland: Secondary | ICD-10-CM | POA: Diagnosis present

## 2023-12-24 DIAGNOSIS — O26893 Other specified pregnancy related conditions, third trimester: Secondary | ICD-10-CM | POA: Insufficient documentation

## 2023-12-24 HISTORY — DX: Unspecified convulsions: R56.9

## 2023-12-24 MED ORDER — SULFAMETHOXAZOLE-TRIMETHOPRIM 800-160 MG PO TABS: 1.0000 | ORAL_TABLET | Freq: Two times a day (BID) | ORAL | 0 refills | Status: AC

## 2023-12-24 MED ORDER — OXYCODONE HCL 5 MG PO TABS: 5.0000 mg | ORAL_TABLET | ORAL | 0 refills | Status: DC | PRN

## 2023-12-24 NOTE — MAU Note (Addendum)
 Jenna Henry is a 28 y.o. at [redacted]w[redacted]d here in MAU reporting: came in by EMS. as a bump on rt side of introitus.  First noted lump yesterday. Bust open around 1130, started draining (at first was yellow, now is looking like blood).  Called EMS because leg was cramping and lower abd was tender.  Can't hardly sit or walk.  Onset of complaint: yesterday.  Pain score: vag 10, radiates into groin and leg Vitals:   12/24/23 1554  BP: (!) 111/59  Pulse: 73  Resp: 17  Temp: 98.6 F (37 C)  SpO2: 99%     FHT:140 Lab orders placed from triage:

## 2023-12-24 NOTE — MAU Provider Note (Signed)
 Event Date/Time   First Provider Initiated Contact with Patient 12/24/23 1607      S [name] is a 28 y.o. G1P0000 pregnant female at 108w3d who presents to MAU today with complaint of a painful vaginal lump that feels like it ruptured and is leaking now. SABRA Comer care at [facility]. Prenatal records reviewed.  Pertinent items noted in HPI and remainder of comprehensive ROS otherwise negative.   O BP (!) 107/50   Pulse 81   Temp 98.6 F (37 C) (Oral)   Resp 17   Ht 5' 5 (1.651 m)   Wt 89.5 kg   LMP 05/11/2023 (Exact Date)   SpO2 99%   BMI 32.85 kg/m  Physical Exam Vitals reviewed. Exam conducted with a chaperone present.  Constitutional:      Appearance: Normal appearance.  HENT:     Head: Normocephalic.  Cardiovascular:     Rate and Rhythm: Normal rate and regular rhythm.     Pulses: Normal pulses.     Heart sounds: Normal heart sounds.  Pulmonary:     Effort: Pulmonary effort is normal.     Breath sounds: Normal breath sounds.  Genitourinary:    Labia:        Right: Tenderness and lesion present.      Comments: Edema consistent with Bartholin's cyst, drainage and pain noted.  Skin:    General: Skin is warm and dry.     Capillary Refill: Capillary refill takes less than 2 seconds.  Neurological:     General: No focal deficit present.     Mental Status: She is alert and oriented to person, place, and time.  Psychiatric:        Mood and Affect: Mood normal.        Behavior: Behavior normal.        Thought Content: Thought content normal.        Judgment: Judgment normal.      MDM: Moderate Review of EMR, PE Bartholin's cyst present on exam, no indication for drainage as already draining.   MAU Course:  A Bartholin's cyst - Plan: Discharge patient  - Bartholin's cyst present on exam, no indication for drainage as already draining. - Enlarged abscess palpated in front of the hymenal ring around 7 o' clock.      Medical screening exam  complete  P - Bactrim  DS bid x 7 days for treatment - Recommended Sitz baths bid and roxicodone  was ordered prn pain.    - She was told to call to be examined if she experiences increasing swelling, pain, vaginal discharge, or fever.  - She was instructed to wear a peripad to absorb discharge, and to maintain pelvic rest until healed Discharge from MAU in stable condition with routine precautions [facility] as [facility] for ongoing prenatal care  Allergies as of 12/24/2023       Reactions   Tylenol  [acetaminophen ] Other (See Comments)   History of Suicide attempt using tylenol . Request to avoid and not offer.    Ortho Tri-cyclen [norgestimate -eth Estradiol ] Hives, Itching, Rash, Other (See Comments)   Caused burning all over        Medication List     STOP taking these medications    erythromycin  ophthalmic ointment       TAKE these medications    albuterol  108 (90 Base) MCG/ACT inhaler Commonly known as: VENTOLIN  HFA Inhale 2 puffs into the lungs every 4 (four) hours as needed for wheezing or  shortness of breath. (Must have office visit for refills)   Blood Pressure Kit Devi 1 Device by Does not apply route as needed.   famotidine  20 MG tablet Commonly known as: PEPCID  Take 1 tablet (20 mg total) by mouth 2 (two) times daily.   Gojji Weight Scale Misc 1 Device by Does not apply route once a week.   hydrOXYzine  25 MG tablet Commonly known as: ATARAX  Take 1 tablet (25 mg total) by mouth every 6 (six) hours as needed for anxiety (May take to support sleep).   oxyCODONE  5 MG immediate release tablet Commonly known as: Roxicodone  Take 1 tablet (5 mg total) by mouth every 4 (four) hours as needed for severe pain (pain score 7-10).   Prenatal 27-1 MG Tabs Take 1 tablet by mouth daily.   sulfamethoxazole -trimethoprim  800-160 MG tablet Commonly known as: BACTRIM  DS Take 1 tablet by mouth 2 (two) times daily for 7 days.   valACYclovir  1000 MG  tablet Commonly known as: Valtrex  Take 1 tablet (1,000 mg total) by mouth 2 (two) times daily.        Camie Rote, MSN, CNM 12/24/2023 5:09 PM  Certified Nurse Midwife, [facility]

## 2023-12-24 NOTE — Discharge Instructions (Addendum)
 Ms. Higginbotham,    Rosine were seen in MAU today and diagnosed with a draining Bartholin's cyst. I recommend the following: - Bactrim  DS bid x 7 days for treatment, this was sent to your pharmacy. Please take all pills even if you feel better.  - Recommended Sitz baths bid and Roxicodone  was given prn pain.   Call to be examined if she experiences increasing swelling, pain, vaginal discharge, or fever.   Please wear a peripad to absorb discharge, and to maintain pelvic rest until healed.    Thank you for trusting us  to care for you, Camie, Midwife

## 2023-12-29 ENCOUNTER — Ambulatory Visit: Admitting: Family Medicine

## 2023-12-29 ENCOUNTER — Other Ambulatory Visit: Payer: Self-pay

## 2023-12-29 VITALS — BP 96/64 | HR 98 | Wt 194.3 lb

## 2023-12-29 DIAGNOSIS — Z3493 Encounter for supervision of normal pregnancy, unspecified, third trimester: Secondary | ICD-10-CM

## 2023-12-29 DIAGNOSIS — O99213 Obesity complicating pregnancy, third trimester: Secondary | ICD-10-CM | POA: Diagnosis not present

## 2023-12-29 DIAGNOSIS — Z3A33 33 weeks gestation of pregnancy: Secondary | ICD-10-CM

## 2023-12-29 DIAGNOSIS — K219 Gastro-esophageal reflux disease without esophagitis: Secondary | ICD-10-CM | POA: Diagnosis not present

## 2023-12-29 DIAGNOSIS — O99210 Obesity complicating pregnancy, unspecified trimester: Secondary | ICD-10-CM

## 2023-12-29 DIAGNOSIS — B009 Herpesviral infection, unspecified: Secondary | ICD-10-CM | POA: Diagnosis not present

## 2023-12-29 DIAGNOSIS — Z23 Encounter for immunization: Secondary | ICD-10-CM | POA: Diagnosis not present

## 2023-12-29 MED ORDER — VALACYCLOVIR HCL 500 MG PO TABS: 500.0000 mg | ORAL_TABLET | Freq: Two times a day (BID) | ORAL | 3 refills | Status: DC

## 2023-12-29 NOTE — Progress Notes (Signed)
   PRENATAL VISIT NOTE  Subjective:  Jenna Henry is a 28 y.o. G1P0000 at [redacted]w[redacted]d being seen today for ongoing prenatal care.  She is currently monitored for the following issues for this low-risk pregnancy and has Herpes; MDD (major depressive disorder); Hydradenitis; PCOS (polycystic ovarian syndrome); Mild intermittent asthma; Fatty liver; Obesity (BMI 30.0-34.9); Supervision of low-risk pregnancy; Obesity in pregnancy; and Carrier of spinal muscular atrophy on their problem list.  Patient reports no bleeding, no contractions, no cramping, and no leaking.  Contractions: Not present. Vag. Bleeding: None.  Movement: Present. Denies leaking of fluid.   The following portions of the patient's history were reviewed and updated as appropriate: allergies, current medications, past family history, past medical history, past social history, past surgical history and problem list.   Objective:    Vitals:   12/29/23 0853  BP: 96/64  Pulse: 98  Weight: 194 lb 5 oz (88.1 kg)    Fetal Status:  Fetal Heart Rate (bpm): 140   Movement: Present    General: Alert, oriented and cooperative. Patient is in no acute distress.  Skin: Skin is warm and dry. No rash noted.   Cardiovascular: Normal heart rate noted  Respiratory: Normal respiratory effort, no problems with respiration noted  Abdomen: Soft, gravid, appropriate for gestational age.  Pain/Pressure: Absent     Pelvic: Cervical exam deferred        Extremities: Normal range of motion.  Edema: None  Mental Status: Normal mood and affect. Normal behavior. Normal judgment and thought content.   Assessment and Plan:  Pregnancy: G1P0000 at [redacted]w[redacted]d 1. Encounter for supervision of low-risk pregnancy in third trimester (Primary) FHR and BP appropriate today Received Tdap today Discussed at length that due to her recurrent syncopal episodes she is likely not a candidate for water  birth.  Patient understanding but upset about that. Bartholin cyst improving.   Has 3 more days of antibiotics.  2. Obesity in pregnancy Growth during pregnancy at goal  3. Herpes No recent outbreaks.  Will need prophylaxis starting at 34 weeks.  Prescription sent to patient's pharmacy for Valtrex  twice daily.  4. Gastroesophageal reflux disease without esophagitis  5. [redacted] weeks gestation of pregnancy  Preterm labor symptoms and general obstetric precautions including but not limited to vaginal bleeding, contractions, leaking of fluid and fetal movement were reviewed in detail with the patient. Please refer to After Visit Summary for other counseling recommendations.   No follow-ups on file.  Future Appointments  Date Time Provider Department Center  01/12/2024 10:35 AM Ilean [name], MD [facility]  01/17/2024 10:35 AM Eldonna Suzen Octave, MD [facility]  01/24/2024 10:35 AM Ilean [name], MD [facility]  02/02/2024  8:15 AM Eldonna Suzen Octave, MD [facility]  02/09/2024 10:35 AM Eldonna Suzen Octave, MD [facility]  02/16/2024 10:35 AM Ilean, [name], MD [facility]    [name] Ilean, MD

## 2024-01-12 ENCOUNTER — Ambulatory Visit: Admitting: Family Medicine

## 2024-01-12 ENCOUNTER — Other Ambulatory Visit: Payer: Self-pay

## 2024-01-12 VITALS — BP 102/68 | HR 96 | Wt 203.0 lb

## 2024-01-12 DIAGNOSIS — B009 Herpesviral infection, unspecified: Secondary | ICD-10-CM

## 2024-01-12 DIAGNOSIS — Z3493 Encounter for supervision of normal pregnancy, unspecified, third trimester: Secondary | ICD-10-CM

## 2024-01-12 DIAGNOSIS — Z3A35 35 weeks gestation of pregnancy: Secondary | ICD-10-CM

## 2024-01-12 DIAGNOSIS — O99210 Obesity complicating pregnancy, unspecified trimester: Secondary | ICD-10-CM

## 2024-01-12 DIAGNOSIS — O99213 Obesity complicating pregnancy, third trimester: Secondary | ICD-10-CM

## 2024-01-12 NOTE — Progress Notes (Signed)
   PRENATAL VISIT NOTE  Subjective:  Jenna Henry is a 28 y.o. G1P0000 at [redacted]w[redacted]d being seen today for ongoing prenatal care.  She is currently monitored for the following issues for this low-risk pregnancy and has Herpes; MDD (major depressive disorder); Hydradenitis; PCOS (polycystic ovarian syndrome); Mild intermittent asthma; Fatty liver; Obesity (BMI 30.0-34.9); Supervision of low-risk pregnancy; Obesity in pregnancy; and Carrier of spinal muscular atrophy on their problem list.  Patient reports no bleeding, no cramping, no leaking, and occasional contractions.  Contractions: Irritability. Vag. Bleeding: None.  Movement: Present. Denies leaking of fluid.   The following portions of the patient's history were reviewed and updated as appropriate: allergies, current medications, past family history, past medical history, past social history, past surgical history and problem list.   Objective:    Vitals:   01/12/24 1102  BP: 102/68  Pulse: 96  Weight: 203 lb (92.1 kg)    Fetal Status:  Fetal Heart Rate (bpm): 126   Movement: Present    General: Alert, oriented and cooperative. Patient is in no acute distress.  Skin: Skin is warm and dry. No rash noted.   Cardiovascular: Normal heart rate noted  Respiratory: Normal respiratory effort, no problems with respiration noted  Abdomen: Soft, gravid, appropriate for gestational age.  Pain/Pressure: Present     Pelvic: Cervical exam deferred        Extremities: Normal range of motion.  Edema: Mild pitting, slight indentation  Mental Status: Normal mood and affect. Normal behavior. Normal judgment and thought content.   Assessment and Plan:  Pregnancy: G1P0000 at [redacted]w[redacted]d 1. Encounter for supervision of low-risk pregnancy in third trimester (Primary) FHR BP appropriate today Cephalic by Leopold's  2. Obesity in pregnancy  3. Herpes On Valtrex  prophylaxis No signs of outbreaks per patient report  4. [redacted] weeks gestation of  pregnancy   Preterm labor symptoms and general obstetric precautions including but not limited to vaginal bleeding, contractions, leaking of fluid and fetal movement were reviewed in detail with the patient. Please refer to After Visit Summary for other counseling recommendations.   No follow-ups on file.  Future Appointments  Date Time Provider Department Center  01/18/2024 11:15 AM Jenna Donnice HERO, MD [facility]  01/24/2024 10:35 AM Jenna Jenna GAILS, MD [facility]  02/02/2024  8:15 AM Jenna Suzen Octave, MD [facility]  02/09/2024 10:35 AM Jenna Suzen Octave, MD [facility]  02/16/2024 10:35 AM Jenna, Jenna GAILS, MD [facility]    Jenna Henry Ilean, MD

## 2024-01-17 ENCOUNTER — Encounter: Admitting: Family Medicine

## 2024-01-18 ENCOUNTER — Other Ambulatory Visit (HOSPITAL_COMMUNITY)
Admission: RE | Admit: 2024-01-18 | Discharge: 2024-01-18 | Disposition: A | Discharge status: Home / Self Care | Source: Ambulatory Visit | Attending: Family Medicine | Admitting: Family Medicine

## 2024-01-18 ENCOUNTER — Ambulatory Visit (INDEPENDENT_AMBULATORY_CARE_PROVIDER_SITE_OTHER): Admitting: Family Medicine

## 2024-01-18 ENCOUNTER — Other Ambulatory Visit: Payer: Self-pay

## 2024-01-18 VITALS — BP 98/67 | HR 96 | Wt 200.0 lb

## 2024-01-18 DIAGNOSIS — R55 Syncope and collapse: Secondary | ICD-10-CM

## 2024-01-18 DIAGNOSIS — R569 Unspecified convulsions: Secondary | ICD-10-CM

## 2024-01-18 DIAGNOSIS — O99213 Obesity complicating pregnancy, third trimester: Secondary | ICD-10-CM

## 2024-01-18 DIAGNOSIS — Z113 Encounter for screening for infections with a predominantly sexual mode of transmission: Secondary | ICD-10-CM | POA: Insufficient documentation

## 2024-01-18 DIAGNOSIS — O99210 Obesity complicating pregnancy, unspecified trimester: Secondary | ICD-10-CM

## 2024-01-18 DIAGNOSIS — J4520 Mild intermittent asthma, uncomplicated: Secondary | ICD-10-CM

## 2024-01-18 DIAGNOSIS — B009 Herpesviral infection, unspecified: Secondary | ICD-10-CM

## 2024-01-18 DIAGNOSIS — Z3493 Encounter for supervision of normal pregnancy, unspecified, third trimester: Secondary | ICD-10-CM

## 2024-01-18 DIAGNOSIS — Z3A36 36 weeks gestation of pregnancy: Secondary | ICD-10-CM | POA: Insufficient documentation

## 2024-01-18 DIAGNOSIS — Z148 Genetic carrier of other disease: Secondary | ICD-10-CM

## 2024-01-18 NOTE — Progress Notes (Signed)
   Subjective:  Jenna Henry is a 28 y.o. G1P0000 at [redacted]w[redacted]d being seen today for ongoing prenatal care.  She is currently monitored for the following issues for this low-risk pregnancy   Patient Active Problem List   Diagnosis Date Noted   Carrier of spinal muscular atrophy 08/31/2023   Supervision of low-risk pregnancy 07/20/2023   Obesity in pregnancy 07/20/2023   Obesity (BMI 30.0-34.9) 04/26/2023   Fatty liver 11/17/2022   Hydradenitis 06/23/2022   PCOS (polycystic ovarian syndrome) 06/23/2022   Mild intermittent asthma 06/23/2022   MDD (major depressive disorder) 10/10/2018   Herpes 11/12/2016    Patient reports pelvic pain.  Contractions: Not present. Vag. Bleeding: None.  Movement: Present. Denies leaking of fluid.   syncope - multiple episodes; one episodes in 2018, none until 3rd trimester, now about once per month. Most recently 3d ago - 2018 was told it was bradycardia, 01/2023 starting having seizures, not on meds - has not noticed any pattern or trigger; sometimes feels dizzy from sit to stand - endorses preceding dizziness and feeling hot. Tried to sit, but fell from standing   The following portions of the patient's history were reviewed and updated as appropriate: allergies, current medications, past family history, past medical history, past social history, past surgical history and problem list. Problem list updated.  Objective:   Vitals:   01/18/24 1118  BP: 98/67  Pulse: 96  Weight: 200 lb (90.7 kg)    Fetal Status: Fetal Heart Rate (bpm): 142   Movement: Present     General:  Alert, oriented and cooperative. Patient is in no acute distress.  Skin: Skin is warm and dry. No rash noted.   Cardiovascular: Normal heart rate noted  Respiratory: Normal respiratory effort, no problems with respiration noted  Abdomen: Soft, gravid, appropriate for gestational age. Pain/Pressure: Present     Pelvic: Vag. Bleeding: None     Cervical exam deferred         Extremities: Normal range of motion.  Edema: Trace  Mental Status: Normal mood and affect. Normal behavior. Normal judgment and thought content.   Urinalysis:      Assessment and Plan:  Pregnancy: G1P0000 at [redacted]w[redacted]d  Assessment & Plan Encounter for supervision of low-risk pregnancy in third trimester Girl/breast/undecided condoms [redacted] weeks gestation of pregnancy - GBS collected - GC/CT collected  Obesity in pregnancy  Herpes Primary outbreak in 2018, genital lesion. Asymptomatic, taking Valtrex . Need spec exam closer to delivery.   Mild intermittent asthma without complication Albuterol  1-2x/month. Carrier of spinal muscular atrophy Horizon INCREASED CARRIER RISK. Partner testing not done.  Syncope, unspecified syncope type Cards consult 07/2023. Zio 03/2023 13 beats SVT. Echo 07/2023 normal. Bp soft today, HR WNL, encouraged po hydration. Seizure-like activity (HCC) After a syncopal episode, ddx vasovagal convulsion per consult 07/2023. EEG 04/2023 WNL. Defer MRI to PP. Not on meds.      Preterm labor symptoms and general obstetric precautions including but not limited to vaginal bleeding, contractions, leaking of fluid and fetal movement were reviewed in detail with the patient. Please refer to After Visit Summary for other counseling recommendations.   Return for as scheduled.   Barabara Maier, DO FMOB Fellow, Faculty practice [facility], Center for [employer]

## 2024-01-18 NOTE — Assessment & Plan Note (Signed)
 Horizon INCREASED CARRIER RISK. Partner testing not done.

## 2024-01-18 NOTE — Assessment & Plan Note (Signed)
 Albuterol  1-2x/month.

## 2024-01-18 NOTE — Patient Instructions (Addendum)
 Please go to the Maternity Assessment Unit (MAU) at [facility] if you experience vaginal bleeding, leaking/gush of fluid like your water  broke, notice decreased movement from your baby after doing kick counts, or if you have contractions the are becoming more intense or more frequent.  - drink 40-60oz of water  per day - change positions slowly, sit to stand, wait a minute before walking - check out this website: www.bedsider.org/birth-control. It reviews each method of contraception (birth control) and has a compare feature   Fetal Movement Counts When you're pregnant, you might start feeling your baby move around the middle of your pregnancy. At first, these movements might feel like flutters, rolls, or swishes. As your baby grows, you might feel more kicks and jabs. Around week 28 of your pregnancy, your health care team may ask you to count how often your baby moves. This is important for all pregnancies, but especially for high-risk ones. Counting movements can help lessen the risk of stillbirth. Do this whenever you feel that you baby has been moving less than usual.  What is a fetal movement count? A fetal movement count is the number of times that you feel your baby move during a certain amount of time. This may also be called a kick count. There are many ways to do a kick count. Ask your team what is best for you. Pay attention to when your baby is most active. You may notice your baby's sleep and wake cycles. You may also notice things that make your baby move more. When you do a kick count, try to do it: When your baby is normally most active. At the same time each day.  How do I count fetal movements? Find a quiet, comfortable area. Sit or lie down. Write down the date, the start time, and the number of movements you feel. Count kicks, flutters, swishes, rolls, and jabs. Usually, you will feel at least 10 movements within 2 hours. Stop counting after you have felt 10 movements  or if you have been counting for 2 hours. Write down the stop time.  Contact a health care provider if:  You don't feel 10 movements in 2 hours. Your baby isn't moving as it usually does. Your baby isn't moving at all. If you're not able to reach your provider, go to an emergency room. This information is not intended to replace advice given to you by your health care provider. Make sure you discuss any questions you have with your health care provider.  Document Revised: 06/11/2023 Document Reviewed: 06/03/2022 Elsevier Patient Education  2025 [employer].  Signs and Symptoms of Labor Labor is the body's natural process of moving the baby and the placenta out of the uterus. The process of labor usually starts when the baby is full-term, 37 weeks and 0 days or more.   As your body prepares for labor and the birth of your baby, you may notice the following symptoms in the weeks and days before true labor starts: Your baby dropping lower into your pelvis to get into position for birth (lightening). When this happens, you may feel more pressure on your bladder and pelvic bone and less pressure on your ribs. This may make it easier to breathe. It may also cause you to need to urinate more often and have problems with bowel movements. Having practice contractions, also called Braxton Hicks contractions or false labor. These occur at irregular (unevenly spaced) intervals that are more than 10 minutes apart. False labor  contractions are common after exercise or sexual activity. They will stop if you change position, rest, or drink fluids. These contractions are usually mild and do not get stronger over time. They may feel like: A backache or back pain. Mild cramps, similar to menstrual cramps. Tightening or pressure in your abdomen. Passing a small amount of thick, bloody mucus from your vagina. This is called normal bloody show or losing your mucus plug. This may happen more than a week before  labor begins, or right before labor begins, as the opening of the cervix starts to widen (dilate). For some women, the entire mucus plug passes at once. For others, pieces of the mucus plug may gradually pass over several days.  Signs and symptoms that labor has begun Signs that you are in labor may include: Having contractions that come at regular (evenly spaced) intervals and increase in intensity. This may feel like more intense tightening or pressure in your abdomen that moves to your back. Feeling pressure in the vaginal area. Your water  breaking (called rupture of membranes). This is when the sac of fluid that surrounds your baby breaks. Fluid leaking from your vagina may be clear or blood-tinged. Labor usually starts within 24 hours of your water  breaking, but it may take longer to begin. Some people may feel a sudden gush of fluid; others may notice repeatedly damp underwear.  Go to the hospital when  Your water  breaks. Your labor has started: painful, regular contractions that are 5 minutes apart You have a fever. You have bright red blood coming from your vagina. You do not feel your baby moving. You have a severe headache with or without vision problems. You have chest pain or shortness of breath. These symptoms may represent a serious problem that is an emergency. Do not wait to see if the symptoms will go away. Get medical help right away. Call your local emergency services (911 in the [place] Do not drive yourself to the hospital.  Summary Labor is your body's natural process of moving your baby and the placenta out of your uterus. The process of labor usually starts when your baby is full-term When labor starts, or if your water  breaks, call your health care provider or nurse care line. Based on your situation, they will determine when you should go in for an exam. This information is not intended to replace advice given to you by your health care provider. Make sure you discuss  any questions you have with your health care provider.  Document Revised: 10/01/2020 Document Reviewed: 10/01/2020-- adapted Elsevier Patient Education  2024 [employer].  Birth Control Options Birth control is also called contraception. Birth control prevents pregnancy. There are many types of birth control. Work with your health care provider to find the best option for you. Birth control that uses hormones These types of birth control have hormones in them to prevent pregnancy. Birth control implant This is a small tube that is put into the skin of your arm. The tube can stay in for up to 3 years. Birth control shot These are shots you get every 3 months. Birth control pills This is a pill you take every day. You need to take it at the same time each day. Birth control patch This is a patch that you put on your skin. You change it 1 time each week for 3 weeks. After that, you take the patch off for 1 week. Vaginal ring  This is a soft plastic ring that  you put in your vagina. The ring is left in for 3 weeks. Then, you take it out for 1 week. Then, you put a new ring in. Barrier methods  Female condom This is a thin covering that you put on the penis before sex. The condom is thrown away after sex. Female condom This is a soft, loose covering that you put in the vagina before sex. The condom is thrown away after sex. Diaphragm A diaphragm is a soft barrier that is shaped like a bowl. It must be made to fit your body. You put it in the vagina before sex with a chemical that kills sperm called spermicide. A diaphragm should be left in the vagina for 6-8 hours after sex and taken out within 24 hours. You need to replace a diaphragm: Every 1-2 years. After giving birth. After gaining more than 15 lb (6.8 kg). If you have surgery on your pelvis. Cervical cap This is a small, soft cup that fits over the cervix. The cervix is the lowest part of the uterus. It's put in the vagina before  sex, along with spermicide. The cap must be made for you. The cap should be left in for 6-8 hours after sex. It is taken out within 48 hours. A cervical cap must be prescribed and fit to your body by a provider. It should be replaced every 2 years. Sponge This is a small sponge that is put into the vagina before sex. It must be left in for at least 6 hours after sex. It must be taken out within 30 hours and thrown away. Spermicides These are chemicals that kill or stop sperm from getting into the uterus. They may be a pill, cream, jelly, or foam that you put into your vagina. They should be used at least 10-15 minutes before sex. Intrauterine device An intrauterine device (IUD) is a device that's put in the uterus by a provider. There are two types: Hormone IUD. This kind can stay in for 3-5 years. Copper IUD. This kind can stay in for 10 years. Permanent birth control Female tubal ligation This is surgery to block the fallopian tubes. Female sterilization This is a surgery, called a vasectomy, to tie off the tubes that carry sperm in men. This method takes 3 months to work. Other forms of birth control must be used for 3 months. Natural planning methods This means not having sex on the days the female partner could get pregnant. Here are some types of natural planning birth control: Using a calendar: To keep track of the length of each menstrual cycle. To find out what days pregnancy can happen. To plan to not have sex on days when pregnancy can happen. Watching for signs of ovulation and not having sex during this time. The female partner can check for ovulation by keeping track of their temperature each day. They can also look for changes in the mucus that comes from the cervix. Where to find more information Centers for Disease Control and Prevention: TonerPromos.no. Then: Enter birth control in the search box. This information is not intended to replace advice given to you by your health care  provider. Make sure you discuss any questions you have with your health care provider. Document Revised: 02/18/2023 Document Reviewed: 07/14/2022 Elsevier Patient Education  2024 [employer].

## 2024-01-18 NOTE — Assessment & Plan Note (Signed)
 Girl/breast/undecided condoms

## 2024-01-18 NOTE — Assessment & Plan Note (Signed)
 Primary outbreak in 2018, genital lesion. Asymptomatic, taking Valtrex . Need spec exam closer to delivery.

## 2024-01-19 LAB — GC/CHLAMYDIA PROBE AMP (~~LOC~~) NOT AT ARMC
Chlamydia: NEGATIVE
Comment: NEGATIVE
Comment: NORMAL
Neisseria Gonorrhea: NEGATIVE

## 2024-01-21 ENCOUNTER — Inpatient Hospital Stay (HOSPITAL_COMMUNITY)
Admission: AD | Admit: 2024-01-21 | Discharge: 2024-01-21 | Disposition: A | Discharge status: Home / Self Care | Attending: Obstetrics & Gynecology | Admitting: Obstetrics & Gynecology

## 2024-01-21 ENCOUNTER — Encounter (HOSPITAL_COMMUNITY): Payer: Self-pay | Admitting: Obstetrics & Gynecology

## 2024-01-21 DIAGNOSIS — B9689 Other specified bacterial agents as the cause of diseases classified elsewhere: Secondary | ICD-10-CM

## 2024-01-21 DIAGNOSIS — O4703 False labor before 37 completed weeks of gestation, third trimester: Secondary | ICD-10-CM | POA: Diagnosis not present

## 2024-01-21 DIAGNOSIS — O23593 Infection of other part of genital tract in pregnancy, third trimester: Secondary | ICD-10-CM | POA: Diagnosis not present

## 2024-01-21 DIAGNOSIS — Z3A36 36 weeks gestation of pregnancy: Secondary | ICD-10-CM

## 2024-01-21 DIAGNOSIS — O479 False labor, unspecified: Secondary | ICD-10-CM | POA: Diagnosis not present

## 2024-01-21 DIAGNOSIS — R102 Pelvic and perineal pain: Secondary | ICD-10-CM | POA: Diagnosis present

## 2024-01-21 DIAGNOSIS — N760 Acute vaginitis: Secondary | ICD-10-CM | POA: Diagnosis not present

## 2024-01-21 LAB — URINALYSIS, ROUTINE W REFLEX MICROSCOPIC
Bilirubin Urine: NEGATIVE
Glucose, UA: NEGATIVE mg/dL
Hgb urine dipstick: NEGATIVE
Ketones, ur: NEGATIVE mg/dL
Nitrite: NEGATIVE
Protein, ur: NEGATIVE mg/dL
Specific Gravity, Urine: 1.002 — ABNORMAL LOW (ref 1.005–1.030)
pH: 7 (ref 5.0–8.0)

## 2024-01-21 LAB — WET PREP, GENITAL
Sperm: NONE SEEN
Trich, Wet Prep: NONE SEEN
WBC, Wet Prep HPF POC: >=10 — AB (ref <10)
Yeast Wet Prep HPF POC: NONE SEEN

## 2024-01-21 LAB — CULTURE, BETA STREP (GROUP B ONLY): Strep Gp B Culture: NEGATIVE

## 2024-01-21 MED ORDER — METRONIDAZOLE 500 MG PO TABS: 500.0000 mg | ORAL_TABLET | Freq: Two times a day (BID) | ORAL | 0 refills | Status: DC

## 2024-01-21 NOTE — MAU Note (Signed)
 MAU Triage Note:  .Jenna Henry is a 28 y.o. at [redacted]w[redacted]d here in MAU reporting: she began having sharp abdominal pain around 2200 last night that has progressively gotten worse. She reports that the pain goes back and forth between being in her upper abdomen and then her lower abdomen. Also had two episodes of vaginal bleeding when wiping after using the restroom and noted one small clot. Denies bleeding since. Denies watery LOF. Reports +FM.  Patient complaint: lower abd pain  Pain Score: 9  Pain Location: Abdomen     Onset of complaint: earlier in the night LMP: Patient's last menstrual period was 05/11/2023 (exact date).  Vitals:   01/21/24 0057 01/21/24 0100  BP: 114/63 109/71  Pulse: 73 78  Resp: 18   Temp: 98.4 F (36.9 C)   SpO2: 100% 100%    FHT:  Fetal Heart Rate Mode: External Baseline Rate (A): 120 bpm Lab orders placed from triage: UA

## 2024-01-21 NOTE — Progress Notes (Addendum)
 History     [id number]  Arrival date and time: 01/21/24 0040    Chief Complaint  Patient presents with   Abdominal Pain     HPI Jenna Henry is a 28 y.o. at [redacted]w[redacted]d by LMP with PMHx notable for h/o syncopal events, HSV, mDD who presents for abdominal/pelvic pain and pressure.  States the discomfort starting this evening and is about every or so.  Rates her pain 9/10.  Had one episode of spotting earlier this afternoon, but nothing since then.  Denies vaginal bleeding currently.  Denies LOF, +Fetal movement.   Pt receives care with [facility]  OB History     Gravida  1   Para  0   Term  0   Preterm  0   AB  0   Living  0      SAB  0   IAB  0   Ectopic  0   Multiple  0   Live Births  0           Past Medical History:  Diagnosis Date   Anxiety    Anxiety state 02/28/2017   Asthma    Chest pain 11/09/2017   Depression    Herpes    Hydradenitis    Insomnia 02/28/2017   Irregular menstruation 07/06/2016   Laceration of left thumb 03/10/2022   Pain in finger of left hand 02/12/2023   Palpitations 11/09/2017   PCOS (polycystic ovarian syndrome)    Polycystic ovaries    Seizures (HCC)    ? 2 wks ago, unknown cause    Past Surgical History:  Procedure Laterality Date   ANKLE RECONSTRUCTION Right 10/28/2021   Procedure: RIGHT ANTERIOR TALOFIBULAR LIGAMENT REPAIR AND SYNDESMOSIS FIXATION;  Surgeon: Harden Jerona GAILS, MD;  Location: [facility];  Service: Orthopedics;  Laterality: Right;  regional block   CYSTECTOMY     axillary area per patient    Family History  Problem Relation Age of Onset   Hyperlipidemia Mother    Depression Mother    Arthritis Mother    Hypertension Mother    Anxiety disorder Mother    Supraventricular tachycardia Mother    Heart disease Father    Heart murmur Father    Hyperlipidemia Maternal Grandmother    Hypertension Maternal Grandfather    Heart disease Maternal Grandfather    Diabetes Maternal  Grandfather    COPD Maternal Grandfather    Heart attack Maternal Grandfather    Colon cancer Neg Hx    Esophageal cancer Neg Hx    Rectal cancer Neg Hx    Stomach cancer Neg Hx     Allergies  Allergen Reactions   Tylenol  [Acetaminophen ] Other (See Comments)    History of Suicide attempt using tylenol . Request to avoid and not offer.    Ortho Tri-Cyclen [Norgestimate -Eth Estradiol ] Hives, Itching, Rash and Other (See Comments)    Caused burning all over    No current facility-administered medications on file prior to encounter.   Current Outpatient Medications on File Prior to Encounter  Medication Sig Dispense Refill   famotidine  (PEPCID ) 20 MG tablet Take 1 tablet (20 mg total) by mouth 2 (two) times daily. 60 tablet 6   Prenatal 27-1 MG TABS Take 1 tablet by mouth daily. 30 tablet 11   valACYclovir  (VALTREX ) 500 MG tablet Take 1 tablet (500 mg total) by mouth 2 (two) times daily. 60 tablet 3   albuterol  (VENTOLIN  HFA) 108 (90 Base) MCG/ACT inhaler Inhale 2 puffs  into the lungs every 4 (four) hours as needed for wheezing or shortness of breath. (Must have office visit for refills) 6.7 g 2   Blood Pressure Monitoring (BLOOD PRESSURE KIT) DEVI 1 Device by Does not apply route as needed. 1 each 0   hydrOXYzine  (ATARAX ) 25 MG tablet Take 1 tablet (25 mg total) by mouth every 6 (six) hours as needed for anxiety (May take to support sleep). (Patient not taking: Reported on 01/18/2024) 30 tablet 2   Misc. Devices (GOJJI WEIGHT SCALE) MISC 1 Device by Does not apply route once a week. 1 each 0   oxyCODONE  (ROXICODONE ) 5 MG immediate release tablet Take 1 tablet (5 mg total) by mouth every 4 (four) hours as needed for severe pain (pain score 7-10). (Patient not taking: Reported on 01/18/2024) 20 tablet 0     Review of Systems  Constitutional:  Negative for chills and fever.  HENT: Negative.    Eyes: Negative.   Respiratory: Negative.    Cardiovascular: Negative.   Gastrointestinal:  Negative.   Genitourinary: Negative.   Musculoskeletal: Negative.   Skin: Negative.   Neurological: Negative.   Endo/Heme/Allergies: Negative.   Psychiatric/Behavioral: Negative.     Pertinent positives and negative per HPI, all others reviewed and negative  Physical Exam   BP 109/71   Pulse 78   Temp 98.4 F (36.9 C) (Oral)   Resp 18   Ht 5' 5 (1.651 m)   LMP 05/11/2023 (Exact Date)   SpO2 100%   BMI 33.28 kg/m   Patient Vitals for the past 24 hrs:  BP Temp Temp src Pulse Resp SpO2 Height  01/21/24 0100 109/71 -- -- 78 -- 100 % --  01/21/24 0057 114/63 98.4 F (36.9 C) Oral 73 18 100 % 5' 5 (1.651 m)    Physical Exam Vitals reviewed. Exam conducted with a chaperone present.  Constitutional:      Appearance: She is well-developed.  HENT:     Head: Normocephalic.  Eyes:     Extraocular Movements: Extraocular movements intact.  Cardiovascular:     Rate and Rhythm: Normal rate.  Pulmonary:     Effort: Pulmonary effort is normal.     Breath sounds: Normal breath sounds.  Abdominal:     Tenderness: There is no abdominal tenderness.     Comments: Gravid, soft and non-tender  Genitourinary:    Comments: Normal external genitalia, white milky to clumpy discharge noted.  Cervix visualized- closed.  On SVE: closed/thick/-3 Musculoskeletal:        General: No swelling or tenderness.  Skin:    General: Skin is warm and dry.  Neurological:     General: No focal deficit present.     Mental Status: She is alert and oriented to person, place, and time.  Psychiatric:        Mood and Affect: Mood normal.        Behavior: Behavior normal.      Cervical Exam Dilation: Closed Effacement (%): Thick Exam by:: Dr. Marilynn EHRICH Baseline 120, moderate variability, +15x15 x 2 accels, no decels Reactive NST Toco: irritability Cat: I  Labs Results for orders placed or performed during the hospital encounter of 01/21/24 (from the past 24 hours)  Urinalysis, Routine w  reflex microscopic -Urine, Clean Catch     Status: Abnormal   Collection Time: 01/21/24  1:04 AM  Result Value Ref Range   Color, Urine STRAW (A) YELLOW   APPearance HAZY (A) CLEAR   Specific  Gravity, Urine 1.002 (L) 1.005 - 1.030   pH 7.0 5.0 - 8.0   Glucose, UA NEGATIVE NEGATIVE mg/dL   Hgb urine dipstick NEGATIVE NEGATIVE   Bilirubin Urine NEGATIVE NEGATIVE   Ketones, ur NEGATIVE NEGATIVE mg/dL   Protein, ur NEGATIVE NEGATIVE mg/dL   Nitrite NEGATIVE NEGATIVE   Leukocytes,Ua MODERATE (A) NEGATIVE   RBC / HPF 0-5 0 - 5 RBC/hpf   WBC, UA 0-5 0 - 5 WBC/hpf   Bacteria, UA RARE (A) NONE SEEN   Squamous Epithelial / HPF 0-5 0 - 5 /HPF  Wet prep, genital     Status: Abnormal   Collection Time: 01/21/24  2:37 AM  Result Value Ref Range   Yeast Wet Prep HPF POC NONE SEEN NONE SEEN   Trich, Wet Prep NONE SEEN NONE SEEN   Clue Cells Wet Prep HPF POC PRESENT (A) NONE SEEN   WBC, Wet Prep HPF POC >=10 (A) <10   Sperm NONE SEEN     MAU Course  Procedures Lab Orders         Wet prep, genital         Urinalysis, Routine w reflex microscopic -Urine, Clean Catch    Meds ordered this encounter  Medications   metroNIDAZOLE  (FLAGYL ) 500 MG tablet    Sig: Take 1 tablet (500 mg total) by mouth 2 (two) times daily.    Dispense:  14 tablet    Refill:  0   Imaging Orders  No imaging studies ordered today    MDM -history and physical completed -UA negative,wet mount obtained -Reactive NST Cat I -no evidence of preterm labor  Assessment and Plan   1. False labor   2. [redacted] weeks gestation of pregnancy   3. Bacterial vaginosis    -encouraged po hydration and other conservative measures regarding pain management -reviewed labor room precautions -Rx for Flagyl  sent in to treat BV -f/u as scheduled for routine OB visit  Analys Ryden M Kham Zuckerman, DO 01/21/24 3:02 AM

## 2024-01-24 ENCOUNTER — Ambulatory Visit: Admitting: Family Medicine

## 2024-01-24 ENCOUNTER — Other Ambulatory Visit: Payer: Self-pay

## 2024-01-24 VITALS — BP 104/70 | HR 77 | Wt 196.0 lb

## 2024-01-24 DIAGNOSIS — J4520 Mild intermittent asthma, uncomplicated: Secondary | ICD-10-CM

## 2024-01-24 DIAGNOSIS — O99210 Obesity complicating pregnancy, unspecified trimester: Secondary | ICD-10-CM

## 2024-01-24 DIAGNOSIS — O99213 Obesity complicating pregnancy, third trimester: Secondary | ICD-10-CM

## 2024-01-24 DIAGNOSIS — Z3A36 36 weeks gestation of pregnancy: Secondary | ICD-10-CM

## 2024-01-24 DIAGNOSIS — Z3493 Encounter for supervision of normal pregnancy, unspecified, third trimester: Secondary | ICD-10-CM

## 2024-01-24 DIAGNOSIS — B009 Herpesviral infection, unspecified: Secondary | ICD-10-CM

## 2024-01-24 DIAGNOSIS — R55 Syncope and collapse: Secondary | ICD-10-CM

## 2024-01-24 NOTE — Progress Notes (Signed)
   PRENATAL VISIT NOTE  Subjective:  Jenna Henry is a 28 y.o. G1P0000 at [redacted]w[redacted]d being seen today for ongoing prenatal care.  She is currently monitored for the following issues for this high-risk pregnancy and has Herpes; MDD (major depressive disorder); Hydradenitis; PCOS (polycystic ovarian syndrome); Mild intermittent asthma; Fatty liver; Obesity (BMI 30.0-34.9); Supervision of low-risk pregnancy; Obesity in pregnancy; and Carrier of spinal muscular atrophy on their problem list.  Patient reports no bleeding, no contractions, no cramping, and no leaking.  Contractions: Irritability. Vag. Bleeding: None.  Movement: Present. Denies leaking of fluid.   The following portions of the patient's history were reviewed and updated as appropriate: allergies, current medications, past family history, past medical history, past social history, past surgical history and problem list.   Objective:    Vitals:   01/24/24 1040  BP: 104/70  Pulse: 77  Weight: 196 lb (88.9 kg)    Fetal Status:  Fetal Heart Rate (bpm): 143   Movement: Present    General: Alert, oriented and cooperative. Patient is in no acute distress.  Skin: Skin is warm and dry. No rash noted.   Cardiovascular: Normal heart rate noted  Respiratory: Normal respiratory effort, no problems with respiration noted  Abdomen: Soft, gravid, appropriate for gestational age.  Pain/Pressure: Present     Pelvic: Cervical exam deferred        Extremities: Normal range of motion.  Edema: Trace  Mental Status: Normal mood and affect. Normal behavior. Normal judgment and thought content.   Assessment and Plan:  Pregnancy: G1P0000 at [redacted]w[redacted]d 1. Encounter for supervision of low-risk pregnancy in third trimester (Primary) FHR BP appropriate today  2. Herpes On Valtrex , no signs of lesion  3. Obesity in pregnancy  4. Mild intermittent asthma without complication Has to use albuterol  1-2 times per month  5. Syncope, unspecified syncope  type Has not had syncopal episodes since previously being seen.  Has not seen cardiology and had Zio patch.  Normal echo in the past.  Heart rate within normal limits.  Discussed p.o. hydration and patient agreeable.  6. [redacted] weeks gestation of pregnancy   Preterm labor symptoms and general obstetric precautions including but not limited to vaginal bleeding, contractions, leaking of fluid and fetal movement were reviewed in detail with the patient. Please refer to After Visit Summary for other counseling recommendations.   No follow-ups on file.  Future Appointments  Date Time Provider Department Center  02/02/2024  8:15 AM Eldonna Suzen Octave, MD [facility]  02/09/2024 10:35 AM Eldonna Suzen Octave, MD [facility]  02/16/2024 10:35 AM Ilean, [name], MD [facility]    [name] Ilean, MD

## 2024-02-02 ENCOUNTER — Other Ambulatory Visit: Payer: Self-pay

## 2024-02-02 ENCOUNTER — Ambulatory Visit: Admitting: Family Medicine

## 2024-02-02 ENCOUNTER — Other Ambulatory Visit (HOSPITAL_COMMUNITY)
Admission: RE | Admit: 2024-02-02 | Discharge: 2024-02-02 | Disposition: A | Discharge status: Home / Self Care | Source: Ambulatory Visit | Attending: Family Medicine | Admitting: Family Medicine

## 2024-02-02 VITALS — BP 129/77 | HR 91 | Wt 201.7 lb

## 2024-02-02 DIAGNOSIS — Z3493 Encounter for supervision of normal pregnancy, unspecified, third trimester: Secondary | ICD-10-CM | POA: Insufficient documentation

## 2024-02-02 DIAGNOSIS — Z3A38 38 weeks gestation of pregnancy: Secondary | ICD-10-CM | POA: Diagnosis not present

## 2024-02-02 DIAGNOSIS — O26893 Other specified pregnancy related conditions, third trimester: Secondary | ICD-10-CM

## 2024-02-02 DIAGNOSIS — N898 Other specified noninflammatory disorders of vagina: Secondary | ICD-10-CM | POA: Insufficient documentation

## 2024-02-02 DIAGNOSIS — O99213 Obesity complicating pregnancy, third trimester: Secondary | ICD-10-CM

## 2024-02-02 DIAGNOSIS — O99210 Obesity complicating pregnancy, unspecified trimester: Secondary | ICD-10-CM

## 2024-02-02 DIAGNOSIS — B009 Herpesviral infection, unspecified: Secondary | ICD-10-CM

## 2024-02-02 DIAGNOSIS — B3731 Acute candidiasis of vulva and vagina: Secondary | ICD-10-CM

## 2024-02-02 MED ORDER — FLUCONAZOLE 150 MG PO TABS: 150.0000 mg | ORAL_TABLET | Freq: Once | ORAL | 3 refills | Status: AC

## 2024-02-02 NOTE — Progress Notes (Signed)
   PRENATAL VISIT NOTE  Subjective:  Jenna Henry is a 28 y.o. G1P0000 at [redacted]w[redacted]d being seen today for ongoing prenatal care.  She is currently monitored for the following issues for this high-risk pregnancy and has Herpes; MDD (major depressive disorder); Hydradenitis; PCOS (polycystic ovarian syndrome); Mild intermittent asthma; Fatty liver; Obesity (BMI 30.0-34.9); Supervision of low-risk pregnancy; Obesity in pregnancy; and Carrier of spinal muscular atrophy on their problem list.  Patient reports no complaints.  Contractions: Irritability. Vag. Bleeding: None.  Movement: Present. Denies leaking of fluid.   The following portions of the patient's history were reviewed and updated as appropriate: allergies, current medications, past family history, past medical history, past social history, past surgical history and problem list.   Objective:    Vitals:   02/02/24 0829  BP: 129/77  Pulse: 91  Weight: 201 lb 11.2 oz (91.5 kg)    Fetal Status:  Fetal Heart Rate (bpm): 155 Fundal Height: 38 cm Movement: Present Presentation: Vertex  General: Alert, oriented and cooperative. Patient is in no acute distress.  Skin: Skin is warm and dry. No rash noted.   Cardiovascular: Normal heart rate noted  Respiratory: Normal respiratory effort, no problems with respiration noted  Abdomen: Soft, gravid, appropriate for gestational age.  Pain/Pressure: Present     Pelvic: Cervical exam performed in the presence of a chaperone Dilation: Fingertip Effacement (%): Thick Station: -3  Extremities: Normal range of motion.  Edema: Trace  Mental Status: Normal mood and affect. Normal behavior. Normal judgment and thought content.   Assessment and Plan:  Pregnancy: G1P0000 at [redacted]w[redacted]d 1. Encounter for supervision of low-risk pregnancy in third trimester (Primary) UTD Vigorous movement Feeling raw after being on metronidazole  -- exam showed whitening of the skin and irritation. Sent in diflucan .   2.  Herpes On PPX  3. Obesity in pregnancy TWG=17 lb 11.2 oz (8.029 kg) which is within goal  4. [redacted] weeks gestation of pregnancy   Preterm labor symptoms and general obstetric precautions including but not limited to vaginal bleeding, contractions, leaking of fluid and fetal movement were reviewed in detail with the patient. Please refer to After Visit Summary for other counseling recommendations.   No follow-ups on file.  Future Appointments  Date Time Provider Department Center  02/09/2024 10:35 AM Eldonna Suzen Octave, MD [facility]  02/16/2024 10:35 AM Ilean, [name], MD [facility]    Suzen Octave Eldonna, MD

## 2024-02-03 ENCOUNTER — Ambulatory Visit: Payer: Self-pay | Admitting: Family Medicine

## 2024-02-03 LAB — CERVICOVAGINAL ANCILLARY ONLY
Bacterial Vaginitis (gardnerella): NEGATIVE
Candida Glabrata: NEGATIVE
Candida Vaginitis: POSITIVE — AB
Comment: NEGATIVE
Comment: NEGATIVE
Comment: NEGATIVE

## 2024-02-06 ENCOUNTER — Ambulatory Visit
Admission: EM | Admit: 2024-02-06 | Discharge: 2024-02-06 | Disposition: A | Discharge status: Home / Self Care | Attending: Emergency Medicine | Admitting: Emergency Medicine

## 2024-02-06 ENCOUNTER — Other Ambulatory Visit: Payer: Self-pay

## 2024-02-06 DIAGNOSIS — L0231 Cutaneous abscess of buttock: Secondary | ICD-10-CM

## 2024-02-06 MED ORDER — FLUCONAZOLE 150 MG PO TABS: ORAL_TABLET | ORAL | 0 refills | Status: DC

## 2024-02-06 MED ORDER — CLINDAMYCIN HCL 300 MG PO CAPS: 300.0000 mg | ORAL_CAPSULE | Freq: Three times a day (TID) | ORAL | 0 refills | Status: AC

## 2024-02-06 NOTE — ED Triage Notes (Signed)
 Pt presents with cyst to right buttock x 1 week. States she has had it before and was treated with antibiotics. Asks if there is a cream that can be used since the last time she had antibiotics it gave has a yeast infection. She has tried soaking it and using a hot pad. States she can't take tylenol  because I tried to overdose on it

## 2024-02-06 NOTE — Discharge Instructions (Signed)
 Clindamycin  antibiotic -- 1 tablet, 3 times daily, for 5 days I have sent one tablet of the fluconazole  for prevention of yeast infection. When you see your ob/gyn in 3 days, please follow up with them regarding your symptoms, and if any further treatment is needed.

## 2024-02-06 NOTE — ED Provider Notes (Signed)
 [facility]    CSN: [id number] Arrival date & time: 02/06/24  0816      History   Chief Complaint Chief Complaint  Patient presents with   Abscess    HPI Jenna Henry is a 28 y.o. female.  Reports a painful lump on the right buttock for about a week. Worsening last day or so. Not draining.  Has tried soaking and hot pad  [redacted] weeks pregnant  History of hydradenitis Also had bartholin cyst last month treated with Bactrim  She developed a yeast infection and was treated by ob/gyn with single dose fluconazole    She is asking for a topical treatment to avoid yeast infection  Past Medical History:  Diagnosis Date   Anxiety    Anxiety state 02/28/2017   Asthma    Chest pain 11/09/2017   Depression    Herpes    Hydradenitis    Insomnia 02/28/2017   Irregular menstruation 07/06/2016   Laceration of left thumb 03/10/2022   Pain in finger of left hand 02/12/2023   Palpitations 11/09/2017   PCOS (polycystic ovarian syndrome)    Polycystic ovaries    Seizures (HCC)    ? 2 wks ago, unknown cause    Patient Active Problem List   Diagnosis Date Noted   Carrier of spinal muscular atrophy 08/31/2023   Supervision of low-risk pregnancy 07/20/2023   Obesity in pregnancy 07/20/2023   Obesity (BMI 30.0-34.9) 04/26/2023   Fatty liver 11/17/2022   Hydradenitis 06/23/2022   PCOS (polycystic ovarian syndrome) 06/23/2022   Mild intermittent asthma 06/23/2022   MDD (major depressive disorder) 10/10/2018   Herpes 11/12/2016    Past Surgical History:  Procedure Laterality Date   ANKLE RECONSTRUCTION Right 10/28/2021   Procedure: RIGHT ANTERIOR TALOFIBULAR LIGAMENT REPAIR AND SYNDESMOSIS FIXATION;  Surgeon: Harden Jerona GAILS, MD;  Location: [facility];  Service: Orthopedics;  Laterality: Right;  regional block   CYSTECTOMY     axillary area per patient    OB History     Gravida  1   Para  0   Term  0   Preterm  0   AB  0   Living  0       SAB  0   IAB  0   Ectopic  0   Multiple  0   Live Births  0            Home Medications    Prior to Admission medications   Medication Sig Start Date End Date Taking? Authorizing Provider  albuterol  (VENTOLIN  HFA) 108 (90 Base) MCG/ACT inhaler Inhale 2 puffs into the lungs every 4 (four) hours as needed for wheezing or shortness of breath. (Must have office visit for refills) 06/23/22  Yes Ozell Heron HERO, MD  Blood Pressure Monitoring (BLOOD PRESSURE KIT) DEVI 1 Device by Does not apply route as needed. 07/20/23  Yes Warren-Hill, Camie LABOR, CNM  clindamycin  (CLEOCIN ) 300 MG capsule Take 1 capsule (300 mg total) by mouth 3 (three) times daily for 5 days. 02/06/24 02/11/24 Yes Lounell Schumacher, Asberry, PA-C  famotidine  (PEPCID ) 20 MG tablet Take 1 tablet (20 mg total) by mouth 2 (two) times daily. 11/24/23  Yes Eldonna Suzen Octave, MD  fluconazole  (DIFLUCAN ) 150 MG tablet Take by mouth once to prevent yeast infection 02/06/24  Yes Mansa Willers, Asberry, PA-C  Prenatal 27-1 MG TABS Take 1 tablet by mouth daily. 07/20/23  Yes Warren-Hill, Camie LABOR, CNM  valACYclovir  (VALTREX ) 500 MG tablet Take 1 tablet (500  mg total) by mouth 2 (two) times daily. 12/29/23  Yes Cresenzo, [name], MD  Misc. Devices (GOJJI WEIGHT SCALE) MISC 1 Device by Does not apply route once a week. 07/20/23   Regino Camie LABOR, CNM    Family History Family History  Problem Relation Age of Onset   Hyperlipidemia Mother    Depression Mother    Arthritis Mother    Hypertension Mother    Anxiety disorder Mother    Supraventricular tachycardia Mother    Heart disease Father    Heart murmur Father    Hyperlipidemia Maternal Grandmother    Hypertension Maternal Grandfather    Heart disease Maternal Grandfather    Diabetes Maternal Grandfather    COPD Maternal Grandfather    Heart attack Maternal Grandfather    Colon cancer Neg Hx    Esophageal cancer Neg Hx    Rectal cancer Neg Hx    Stomach cancer Neg Hx     Social  History Social History   Tobacco Use   Smoking status: Former    Types: Cigarettes   Smokeless tobacco: Never   Tobacco comments:    Vaping    04/08/2023 Patient smokes marijuana daily  Vaping Use   Vaping status: Former   Substances: Nicotine , Flavoring   Devices: stopped when pregnant  Substance Use Topics   Alcohol use: Not Currently    Alcohol/week: 2.0 standard drinks of alcohol    Types: 2 Cans of beer per week    Comment: occassional -holidays   Drug use: Not Currently    Frequency: 14.0 times per week    Types: Marijuana    Comment: last was 12/23/23 (morning to help eat breakfast, at night to help sleep)     Allergies   Tylenol  [acetaminophen ] and Ortho tri-cyclen [norgestimate -eth estradiol ]   Review of Systems Review of Systems As per HPI  Physical Exam Triage Vital Signs ED Triage Vitals  Encounter Vitals Group     BP      Girls Systolic BP Percentile      Girls Diastolic BP Percentile      Boys Systolic BP Percentile      Boys Diastolic BP Percentile      Pulse      Resp      Temp      Temp src      SpO2      Weight      Height      Head Circumference      Peak Flow      Pain Score      Pain Loc      Pain Education      Exclude from Growth Chart    No data found.  Updated Vital Signs BP 116/73 (BP Location: Left Arm)   Pulse 71   Temp 98.1 F (36.7 C)   Resp 18   LMP 05/11/2023 (Exact Date)   SpO2 99%    Physical Exam Vitals and nursing note reviewed. Exam conducted with a chaperone present [facility] CMA).  Constitutional:      General: She is not in acute distress.    Appearance: Normal appearance.  HENT:     Mouth/Throat:     Pharynx: Oropharynx is clear.  Cardiovascular:     Rate and Rhythm: Normal rate and regular rhythm.     Heart sounds: Normal heart sounds.  Pulmonary:     Effort: Pulmonary effort is normal.     Breath sounds: Normal breath sounds.  Abdominal:  Comments: gravid  Genitourinary:     Comments:  Area of induration and tenderness right inner glute. Erythematous. Not fluctuant, not draining.  Skin:    Findings: Abscess and erythema present.  Neurological:     Mental Status: She is alert and oriented to person, place, and time.      UC Treatments / Results  Labs (all labs ordered are listed, but only abnormal results are displayed) Labs Reviewed - No data to display  EKG   Radiology No results found.  Procedures Procedures (including critical care time)  Medications Ordered in UC Medications - No data to display  Initial Impression / Assessment and Plan / UC Course  I have reviewed the triage vital signs and the nursing notes.  Pertinent labs & imaging results that were available during my care of the patient were reviewed by me and considered in my medical decision making (see chart for details).   She does not want oral antibiotics since they gave her a yeast infection last time. However the indurated tender and erythematous area of the buttock does require antibiotic at this time, topical therapy would not be effective. I have sent clindamycin  -- 300 mg TID x 5 days. We did discuss use of fluconazole  in pregnancy and current evidence that is available. She is comfortable taking it especially because her ob/gyn prescribed it. One dose fluconazole  is sent to prevent yeast infection with clinda use. She sees ob/gyn in 3 days, will follow up with them regarding symptoms and if further treatment is needed. Agrees to plan, no questions   Final Clinical Impressions(s) / UC Diagnoses   Final diagnoses:  Abscess of right buttock     Discharge Instructions      Clindamycin  antibiotic -- 1 tablet, 3 times daily, for 5 days I have sent one tablet of the fluconazole  for prevention of yeast infection. When you see your ob/gyn in 3 days, please follow up with them regarding your symptoms, and if any further treatment is needed.      ED Prescriptions     Medication Sig  Dispense Auth. Provider   clindamycin  (CLEOCIN ) 300 MG capsule Take 1 capsule (300 mg total) by mouth 3 (three) times daily for 5 days. 15 capsule Domenique Southers, PA-C   fluconazole  (DIFLUCAN ) 150 MG tablet Take by mouth once to prevent yeast infection 1 tablet Alahia Whicker, Asberry, PA-C      PDMP not reviewed this encounter.   Login Muckleroy, Asberry, PA-C 02/06/24 0930

## 2024-02-09 ENCOUNTER — Ambulatory Visit: Admitting: Family Medicine

## 2024-02-09 ENCOUNTER — Other Ambulatory Visit: Payer: Self-pay

## 2024-02-09 VITALS — BP 112/75 | HR 102 | Wt 199.0 lb

## 2024-02-09 DIAGNOSIS — Z3493 Encounter for supervision of normal pregnancy, unspecified, third trimester: Secondary | ICD-10-CM

## 2024-02-09 DIAGNOSIS — Z3A39 39 weeks gestation of pregnancy: Secondary | ICD-10-CM

## 2024-02-09 DIAGNOSIS — O99213 Obesity complicating pregnancy, third trimester: Secondary | ICD-10-CM

## 2024-02-09 DIAGNOSIS — O99210 Obesity complicating pregnancy, unspecified trimester: Secondary | ICD-10-CM

## 2024-02-09 DIAGNOSIS — Z720 Tobacco use: Secondary | ICD-10-CM

## 2024-02-09 DIAGNOSIS — B009 Herpesviral infection, unspecified: Secondary | ICD-10-CM

## 2024-02-09 DIAGNOSIS — F320 Major depressive disorder, single episode, mild: Secondary | ICD-10-CM

## 2024-02-09 NOTE — Progress Notes (Signed)
 PRENATAL VISIT NOTE  Subjective:  Jenna Henry is a 28 y.o. G1P0000 at [redacted]w[redacted]d being seen today for ongoing prenatal care.  She is currently monitored for the following issues for this low-risk pregnancy and has Herpes; MDD (major depressive disorder); Hydradenitis; PCOS (polycystic ovarian syndrome); Mild intermittent asthma; Fatty liver; Obesity (BMI 30.0-34.9); Supervision of low-risk pregnancy; Obesity in pregnancy; and Carrier of spinal muscular atrophy on their problem list.  Patient reports no complaints.  Contractions: Irritability. Vag. Bleeding: None.  Movement: Present. Denies leaking of fluid.   Estimated Date of Delivery: 02/15/24  The following portions of the patient's history were reviewed and updated as appropriate: allergies, current medications, past family history, past medical history, past social history, past surgical history and problem list.   Objective:    Vitals:   02/09/24 1114  BP: 112/75  Pulse: (!) 102  Weight: 199 lb (90.3 kg)    Fetal Status:  Fetal Heart Rate (bpm): 145   Movement: Present    General: Alert, oriented and cooperative. Patient is in no acute distress.  Skin: Skin is warm and dry. No rash noted.   Cardiovascular: Normal heart rate noted  Respiratory: Normal respiratory effort, no problems with respiration noted  Abdomen: Soft, gravid, appropriate for gestational age.  Pain/Pressure: Present     Pelvic: Cervical exam deferred        Extremities: Normal range of motion.  Edema: None  Mental Status: Normal mood and affect. Normal behavior. Normal judgment and thought content.   Assessment and Plan:  Pregnancy: G1P0000 at [redacted]w[redacted]d  1. Encounter for supervision of low-risk pregnancy in third trimester (Primary) Vigorous movement FH appropriate Patient is also upset about not having WB as an option-- she did take the class. She reports no one talked to her about this at the hospital. I reviewed the why behind this especially with a recent  event in July 2025. She has had these syncopal events her whole life and typical stressors are stress and getting warm. I discussed that labor is stressful and given her loss of consciousness the waterbirth team does not feel waterbirth is safe. I explained that if she were to have syncope in the water  this would be a danger to her. She is still able to labor in the shower and labor without medications   Desires BTS. Discussed salpingectomy and need for 30d medicaid papers-- signed today. Will need interval tubal.   Reviewed plan for IOL on 9/23, she would like to be on the elective list starting on EDD which is 9/16. Explained that this is not any guarantee that she will be called. Reviewed that elective IOL is not a priority for the hospital or team. Discussed the risks associated with induction in general and that spontaneous labor is likely to happen 39-41 weeks.  2. Obesity in pregnancy TWG=15 lb (6.804 kg)   3. Current mild episode of major depressive disorder, unspecified whether recurrent (HCC) Stable.   4. Herpes Stopped taking valtrex , will restart. Reviewed importance of this for HSV suppression and that if lesions develop this would mean a C-section.   Preterm labor symptoms and general obstetric precautions including but not limited to vaginal bleeding, contractions, leaking of fluid and fetal movement were reviewed in detail with the patient. Please refer to After Visit Summary for other counseling recommendations.   Return in about 1 week (around 02/16/2024) for Routine prenatal care.  Future Appointments  Date Time Provider Department Center  02/16/2024 10:35 AM Ilean, [name],  MD [facility]  02/22/2024  6:30 AM [facility] SCHED ROOM [facility] None    Suzen Maryan Masters, MD

## 2024-02-14 DIAGNOSIS — Z720 Tobacco use: Secondary | ICD-10-CM | POA: Insufficient documentation

## 2024-02-16 ENCOUNTER — Telehealth (HOSPITAL_COMMUNITY): Payer: Self-pay | Admitting: *Deleted

## 2024-02-16 ENCOUNTER — Other Ambulatory Visit: Payer: Self-pay

## 2024-02-16 ENCOUNTER — Ambulatory Visit: Admitting: Family Medicine

## 2024-02-16 VITALS — BP 115/75 | HR 105 | Wt 204.7 lb

## 2024-02-16 DIAGNOSIS — O99210 Obesity complicating pregnancy, unspecified trimester: Secondary | ICD-10-CM

## 2024-02-16 DIAGNOSIS — O99213 Obesity complicating pregnancy, third trimester: Secondary | ICD-10-CM

## 2024-02-16 DIAGNOSIS — Z3493 Encounter for supervision of normal pregnancy, unspecified, third trimester: Secondary | ICD-10-CM

## 2024-02-16 DIAGNOSIS — Z3A40 40 weeks gestation of pregnancy: Secondary | ICD-10-CM

## 2024-02-16 DIAGNOSIS — F320 Major depressive disorder, single episode, mild: Secondary | ICD-10-CM

## 2024-02-16 DIAGNOSIS — B009 Herpesviral infection, unspecified: Secondary | ICD-10-CM

## 2024-02-16 NOTE — Telephone Encounter (Signed)
 Preadmission screen

## 2024-02-16 NOTE — Progress Notes (Signed)
   PRENATAL VISIT NOTE  Subjective:  Jenna Henry is a 28 y.o. G1P0000 at [redacted]w[redacted]d being seen today for ongoing prenatal care.  She is currently monitored for the following issues for this low-risk pregnancy and has Herpes; MDD (major depressive disorder); Hydradenitis; PCOS (polycystic ovarian syndrome); Mild intermittent asthma; Fatty liver; Obesity (BMI 30.0-34.9); Supervision of low-risk pregnancy; Obesity in pregnancy; Carrier of spinal muscular atrophy; and Tobacco use on their problem list.  Patient reports no bleeding, no contractions, no cramping, and no leaking.  Contractions: Irritability. Vag. Bleeding: None.  Movement: Present. Denies leaking of fluid.   The following portions of the patient's history were reviewed and updated as appropriate: allergies, current medications, past family history, past medical history, past social history, past surgical history and problem list.   Objective:    Vitals:   02/16/24 1049  BP: 115/75  Pulse: (!) 105  Weight: 204 lb 11.2 oz (92.9 kg)    Fetal Status:  Fetal Heart Rate (bpm): 13600   Movement: Present    General: Alert, oriented and cooperative. Patient is in no acute distress.  Skin: Skin is warm and dry. No rash noted.   Cardiovascular: Normal heart rate noted  Respiratory: Normal respiratory effort, no problems with respiration noted  Abdomen: Soft, gravid, appropriate for gestational age.  Pain/Pressure: Present     Pelvic: Cervical exam performed in the presence of a chaperone      fingertip/thin/-2  Extremities: Normal range of motion.  Edema: None  Mental Status: Normal mood and affect. Normal behavior. Normal judgment and thought content.   Assessment and Plan:  Pregnancy: G1P0000 at [redacted]w[redacted]d 1. Encounter for supervision of low-risk pregnancy in third trimester (Primary) FHR and BP appropriate today  2. Obesity in pregnancy Total weight gains approximately 15 pounds.  3. Current mild episode of major depressive disorder,  unspecified whether recurrent (HCC) Stable  4. Herpes On Valtrex , no outbreak symptoms at this time  5. [redacted] weeks gestation of pregnancy Scheduled for induction of labor at 41 weeks but is also on the elective list and is hoping to be called sooner rather than later  Term labor symptoms and general obstetric precautions including but not limited to vaginal bleeding, contractions, leaking of fluid and fetal movement were reviewed in detail with the patient. Please refer to After Visit Summary for other counseling recommendations.   No follow-ups on file.  Future Appointments  Date Time Provider Department Center  02/22/2024  6:30 AM [facility] SCHED ROOM [facility] None    Norleen LULLA Rover, MD

## 2024-02-17 ENCOUNTER — Other Ambulatory Visit: Payer: Self-pay

## 2024-02-17 ENCOUNTER — Encounter (HOSPITAL_COMMUNITY): Payer: Self-pay | Admitting: Obstetrics and Gynecology

## 2024-02-17 ENCOUNTER — Inpatient Hospital Stay (HOSPITAL_COMMUNITY)
Admission: RE | Admit: 2024-02-17 | Discharge: 2024-02-18 | DRG: 786 | Discharge status: Against Medical Advice | Attending: Obstetrics & Gynecology | Admitting: Obstetrics & Gynecology

## 2024-02-17 DIAGNOSIS — Z3A40 40 weeks gestation of pregnancy: Secondary | ICD-10-CM

## 2024-02-17 DIAGNOSIS — Z833 Family history of diabetes mellitus: Secondary | ICD-10-CM | POA: Diagnosis not present

## 2024-02-17 DIAGNOSIS — Z87891 Personal history of nicotine dependence: Secondary | ICD-10-CM

## 2024-02-17 DIAGNOSIS — O99353 Diseases of the nervous system complicating pregnancy, third trimester: Secondary | ICD-10-CM | POA: Diagnosis present

## 2024-02-17 DIAGNOSIS — A419 Sepsis, unspecified organism: Secondary | ICD-10-CM | POA: Diagnosis present

## 2024-02-17 DIAGNOSIS — Z8249 Family history of ischemic heart disease and other diseases of the circulatory system: Secondary | ICD-10-CM | POA: Diagnosis not present

## 2024-02-17 DIAGNOSIS — B009 Herpesviral infection, unspecified: Secondary | ICD-10-CM | POA: Diagnosis present

## 2024-02-17 DIAGNOSIS — O9952 Diseases of the respiratory system complicating childbirth: Secondary | ICD-10-CM | POA: Diagnosis present

## 2024-02-17 DIAGNOSIS — O99214 Obesity complicating childbirth: Secondary | ICD-10-CM | POA: Diagnosis present

## 2024-02-17 DIAGNOSIS — J4520 Mild intermittent asthma, uncomplicated: Secondary | ICD-10-CM | POA: Diagnosis present

## 2024-02-17 DIAGNOSIS — O99013 Anemia complicating pregnancy, third trimester: Secondary | ICD-10-CM | POA: Diagnosis present

## 2024-02-17 DIAGNOSIS — Z148 Genetic carrier of other disease: Secondary | ICD-10-CM | POA: Diagnosis not present

## 2024-02-17 DIAGNOSIS — O9902 Anemia complicating childbirth: Secondary | ICD-10-CM | POA: Diagnosis present

## 2024-02-17 DIAGNOSIS — Z3490 Encounter for supervision of normal pregnancy, unspecified, unspecified trimester: Principal | ICD-10-CM | POA: Diagnosis present

## 2024-02-17 DIAGNOSIS — Z5329 Procedure and treatment not carried out because of patient's decision for other reasons: Secondary | ICD-10-CM | POA: Diagnosis present

## 2024-02-17 DIAGNOSIS — M419 Scoliosis, unspecified: Secondary | ICD-10-CM | POA: Diagnosis present

## 2024-02-17 DIAGNOSIS — O99334 Smoking (tobacco) complicating childbirth: Secondary | ICD-10-CM | POA: Diagnosis not present

## 2024-02-17 DIAGNOSIS — O98313 Other infections with a predominantly sexual mode of transmission complicating pregnancy, third trimester: Secondary | ICD-10-CM | POA: Diagnosis present

## 2024-02-17 DIAGNOSIS — O480 Post-term pregnancy: Secondary | ICD-10-CM | POA: Diagnosis present

## 2024-02-17 DIAGNOSIS — O26893 Other specified pregnancy related conditions, third trimester: Secondary | ICD-10-CM | POA: Diagnosis present

## 2024-02-17 DIAGNOSIS — O411230 Chorioamnionitis, third trimester, not applicable or unspecified: Secondary | ICD-10-CM | POA: Diagnosis present

## 2024-02-17 DIAGNOSIS — O34211 Maternal care for low transverse scar from previous cesarean delivery: Secondary | ICD-10-CM | POA: Diagnosis present

## 2024-02-17 DIAGNOSIS — A6000 Herpesviral infection of urogenital system, unspecified: Secondary | ICD-10-CM | POA: Diagnosis present

## 2024-02-17 DIAGNOSIS — O9832 Other infections with a predominantly sexual mode of transmission complicating childbirth: Secondary | ICD-10-CM | POA: Diagnosis present

## 2024-02-17 DIAGNOSIS — O09523 Supervision of elderly multigravida, third trimester: Secondary | ICD-10-CM | POA: Diagnosis not present

## 2024-02-17 LAB — TYPE AND SCREEN
ABO/RH(D): O POS
Antibody Screen: NEGATIVE

## 2024-02-17 LAB — CBC
HCT: 32.7 % — ABNORMAL LOW (ref 36.0–46.0)
Hemoglobin: 10.6 g/dL — ABNORMAL LOW (ref 12.0–15.0)
MCH: 29.4 pg (ref 26.0–34.0)
MCHC: 32.4 g/dL (ref 30.0–36.0)
MCV: 90.6 fL (ref 80.0–100.0)
Platelets: 254 K/uL (ref 150–400)
RBC: 3.61 MIL/uL — ABNORMAL LOW (ref 3.87–5.11)
RDW: 15.7 % — ABNORMAL HIGH (ref 11.5–15.5)
WBC: 8.2 K/uL (ref 4.0–10.5)
nRBC: 0 % (ref 0.0–0.2)

## 2024-02-17 LAB — RPR: RPR Ser Ql: NONREACTIVE

## 2024-02-17 LAB — HIV ANTIBODY (ROUTINE TESTING W REFLEX): HIV Screen 4th Generation wRfx: NONREACTIVE

## 2024-02-17 MED ORDER — MISOPROSTOL 25 MCG QUARTER TABLET
25.0000 ug | ORAL_TABLET | ORAL | Status: DC | PRN
  Administered 2024-02-17: 25 ug via VAGINAL
  Filled 2024-02-17: qty 1

## 2024-02-17 MED ORDER — ACETAMINOPHEN 325 MG PO TABS: 650.0000 mg | ORAL_TABLET | ORAL | Status: DC | PRN

## 2024-02-17 MED ORDER — ONDANSETRON HCL 4 MG/2ML IJ SOLN
4.0000 mg | Freq: Four times a day (QID) | INTRAMUSCULAR | Status: DC | PRN
  Administered 2024-02-17: 4 mg via INTRAVENOUS
  Filled 2024-02-17: qty 2

## 2024-02-17 MED ORDER — OXYTOCIN-SODIUM CHLORIDE 30-0.9 UT/500ML-% IV SOLN
1.0000 m[IU]/min | INTRAVENOUS | Status: DC
  Administered 2024-02-17: 2 m[IU]/min via INTRAVENOUS

## 2024-02-17 MED ORDER — LACTATED RINGERS IV SOLN: 500.0000 mL | INTRAVENOUS | Status: AC | PRN

## 2024-02-17 MED ORDER — FLEET ENEMA RE ENEM: 1.0000 | ENEMA | RECTAL | Status: DC | PRN

## 2024-02-17 MED ORDER — OXYTOCIN-SODIUM CHLORIDE 30-0.9 UT/500ML-% IV SOLN
2.5000 [IU]/h | INTRAVENOUS | Status: DC
Start: 2024-02-17 — End: 2024-02-18
  Filled 2024-02-17: qty 500

## 2024-02-17 MED ORDER — SOD CITRATE-CITRIC ACID 500-334 MG/5ML PO SOLN: 30.0000 mL | ORAL | Status: DC | PRN

## 2024-02-17 MED ORDER — MISOPROSTOL 50MCG HALF TABLET
50.0000 ug | ORAL_TABLET | ORAL | Status: DC | PRN
  Administered 2024-02-17: 50 ug via ORAL
  Filled 2024-02-17: qty 1

## 2024-02-17 MED ORDER — TERBUTALINE SULFATE 1 MG/ML IJ SOLN: 0.2500 mg | Freq: Once | INTRAMUSCULAR | Status: DC | PRN

## 2024-02-17 MED ORDER — LACTATED RINGERS IV SOLN
INTRAVENOUS | Status: AC
  Administered 2024-02-17: 125 mL/h via INTRAVENOUS

## 2024-02-17 MED ORDER — OXYTOCIN-SODIUM CHLORIDE 30-0.9 UT/500ML-% IV SOLN: 1.0000 m[IU]/min | INTRAVENOUS | Status: DC

## 2024-02-17 MED ORDER — FENTANYL CITRATE (PF) 100 MCG/2ML IJ SOLN
50.0000 ug | INTRAMUSCULAR | Status: DC | PRN
  Administered 2024-02-17 (×4): 100 ug via INTRAVENOUS
  Filled 2024-02-17 (×4): qty 2

## 2024-02-17 MED ORDER — OXYCODONE-ACETAMINOPHEN 5-325 MG PO TABS: 1.0000 | ORAL_TABLET | ORAL | Status: DC | PRN

## 2024-02-17 MED ORDER — LIDOCAINE HCL (PF) 1 % IJ SOLN: 30.0000 mL | INTRAMUSCULAR | Status: DC | PRN

## 2024-02-17 MED ORDER — OXYCODONE-ACETAMINOPHEN 5-325 MG PO TABS: 2.0000 | ORAL_TABLET | ORAL | Status: DC | PRN

## 2024-02-17 MED ORDER — OXYTOCIN BOLUS FROM INFUSION: 333.0000 mL | Freq: Once | INTRAVENOUS | Status: DC

## 2024-02-17 NOTE — Plan of Care (Signed)

## 2024-02-17 NOTE — Progress Notes (Signed)
 Patient requests to remove foley catheter. Complaining of nausea and feeling like body is on fire. Some cramping not severe. Explained balloon unlikely source of current symptoms and that removing it will prolong this induction. This is an elective induction, membranes intact, and cat 1 tracing, offered patient discharge if she no longer desires induction. Contracting too frequently currently for re-dose of cytotec . She insists on removing foley so we will proceed with that, re-dose cytotec  when contractions space out.

## 2024-02-17 NOTE — Progress Notes (Addendum)
 Jenna Henry is a 28 y.o. G1P0000 at [redacted]w[redacted]d by LMP admitted for induction of labor due to Elective at term.  Subjective: Patient in bed talking through contractions with family at bedside.   Objective: BP 129/82   Pulse 76   Temp 98.2 F (36.8 C) (Oral)   Resp 16   Ht 5' 5 (1.651 m)   Wt 93.8 kg   LMP 05/11/2023 (Exact Date)   BMI 34.43 kg/m  No intake/output data recorded. No intake/output data recorded.  FHT:  FHR: 145 bpm, variability: moderate,  accelerations:  Present,  decelerations:  Present variables, mild and infrequent UC:   frequent contractions q1 1-2 minutes SVE:   Dilation: 3 Effacement (%): 50 Station: -1 Exam by:: Jasmine, SMW  Labs: Lab Results  Component Value Date   WBC 8.2 02/17/2024   HGB 10.6 (L) 02/17/2024   HCT 32.7 (L) 02/17/2024   MCV 90.6 02/17/2024   PLT 254 02/17/2024    Assessment / Plan: Patient AROM'd with clear fluid and IUPC placed due to patient contracting frequently with minimal cervical change.  Labor: progressing, will continue to titrate pitocin ; on 3mu/min at present Preeclampsia:  n/a Fetal Wellbeing:  Category II Pain Control:  Labor support without medications and Nitrous Oxide I/D:  GBS positive Anticipated MOD:  NSVD  [name], Student-MidWife 02/17/2024, 9:49 PM  [name]

## 2024-02-17 NOTE — H&P (Addendum)
 Jenna Henry is a 28 y.o. G1P0000 female at [redacted]w[redacted]d by LMP c/w [redacted]w[redacted]d u/s presenting for eIOL.   Reports active fetal movement, contractions: none, vaginal bleeding: none, membranes: intact.  Initiated prenatal care at [facility] at 10 wks.   Most recent u/s 10/21/23 at 23 wks- EFW 32%, AFI nl, ant placenta.   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 2 wks ago, unknown cause    Past Surgical History: Past Surgical History:  Procedure Laterality Date   ANKLE RECONSTRUCTION Right 10/28/2021   Procedure: RIGHT ANTERIOR TALOFIBULAR LIGAMENT REPAIR AND SYNDESMOSIS FIXATION;  Surgeon: Harden Jerona GAILS, MD;  Location: [facility];  Service: Orthopedics;  Laterality: Right;  regional block   CYSTECTOMY     axillary area per patient    Obstetrical History: OB History     Gravida  1   Para  0   Term  0   Preterm  0   AB  0   Living  0      SAB  0   IAB  0   Ectopic  0   Multiple  0   Live Births  0           Social History: Social History   Socioeconomic History   Marital status: Single    Spouse name: Not on file   Number of children: Not on file   Years of education: Not on file   Highest education level: Some college, no degree  Occupational History   Not on file  Tobacco Use   Smoking status: Former    Types: Cigarettes   Smokeless tobacco:  Never   Tobacco comments:    Vaping    04/08/2023 Patient smokes marijuana daily  Vaping Use   Vaping status: Former   Substances: Nicotine , Flavoring   Devices: stopped when pregnant  Substance and Sexual Activity   Alcohol use: Not Currently    Alcohol/week: 2.0 standard drinks of alcohol    Types: 2 Cans of beer per week    Comment: occassional -holidays   Drug use: Not Currently    Frequency: 14.0 times per week    Types: Marijuana    Comment: last was 12/23/23 (morning to help eat breakfast, at night to help sleep)   Sexual activity: Yes    Birth control/protection: None  Other Topics Concern   Not on file  Social History Narrative   Are you right handed or left handed? Right   Are you currently employed ?    What is your current occupation?   Do you live at home alone?   Who lives with you?    What type of home do you live in: 1 story or 2 story?        Social Drivers of Health   Financial Resource Strain: Medium Risk (07/19/2023)  Overall Financial Resource Strain (CARDIA)    Difficulty of Paying Living Expenses: Somewhat hard  Food Insecurity: Food Insecurity Present (02/17/2024)   Hunger Vital Sign    Worried About Running Out of Food in the Last Year: Sometimes true    Ran Out of Food in the Last Year: Sometimes true  Transportation Needs: No Transportation Needs (02/17/2024)   PRAPARE - Administrator, Civil Service (Medical): No    Lack of Transportation (Non-Medical): No  Physical Activity: Insufficiently Active (07/19/2023)   Exercise Vital Sign    Days of Exercise per Week: 2 days    Minutes of Exercise per Session: 10 min  Stress: No Stress Concern Present (07/19/2023)   [employer] of Occupational Health - Occupational Stress Questionnaire    Feeling of Stress : Not at all  Recent Concern: Stress - Stress Concern Present (04/21/2023)   [employer] of Occupational Health - Occupational Stress Questionnaire    Feeling of Stress : To  some extent  Social Connections: Socially Isolated (07/19/2023)   Social Connection and Isolation Panel    Frequency of Communication with Friends and Family: Three times a week    Frequency of Social Gatherings with Friends and Family: Twice a week    Attends Religious Services: Never    Database administrator or Organizations: No    Attends Engineer, structural: Not on file    Marital Status: Never married    Family History: Family History  Problem Relation Age of Onset   Hyperlipidemia Mother    Depression Mother    Arthritis Mother    Hypertension Mother    Anxiety disorder Mother    Supraventricular tachycardia Mother    Heart disease Father    Heart murmur Father    Hyperlipidemia Maternal Grandmother    Hypertension Maternal Grandfather    Heart disease Maternal Grandfather    Diabetes Maternal Grandfather    COPD Maternal Grandfather    Heart attack Maternal Grandfather    Colon cancer Neg Hx    Esophageal cancer Neg Hx    Rectal cancer Neg Hx    Stomach cancer Neg Hx     Allergies: Allergies  Allergen Reactions   Tylenol  [Acetaminophen ] Other (See Comments)    History of Suicide attempt using tylenol . Request to avoid and not offer.    Clindamycin /Lincomycin Itching and Rash   Ortho Tri-Cyclen [Norgestimate -Eth Estradiol ] Hives, Itching, Rash and Other (See Comments)    Caused burning all over    Medications Prior to Admission  Medication Sig Dispense Refill Last Dose/Taking   albuterol  (VENTOLIN  HFA) 108 (90 Base) MCG/ACT inhaler Inhale 2 puffs into the lungs every 4 (four) hours as needed for wheezing or shortness of breath. (Must have office visit for refills) 6.7 g 2 Past Week   famotidine  (PEPCID ) 20 MG tablet Take 1 tablet (20 mg total) by mouth 2 (two) times daily. 60 tablet 6 02/17/2024 Morning   Prenatal 27-1 MG TABS Take 1 tablet by mouth daily. 30 tablet 11 Past Week   Blood Pressure Monitoring (BLOOD PRESSURE KIT) DEVI 1 Device by Does  not apply route as needed. (Patient not taking: Reported on 02/16/2024) 1 each 0 More than a month   fluconazole  (DIFLUCAN ) 150 MG tablet Take by mouth once to prevent yeast infection (Patient not taking: Reported on 02/09/2024) 1 tablet 0 More than a month   Misc. Devices (GOJJI WEIGHT SCALE) MISC 1 Device by Does not apply route once a  week. (Patient not taking: Reported on 02/16/2024) 1 each 0 More than a month   valACYclovir  (VALTREX ) 500 MG tablet Take 1 tablet (500 mg total) by mouth 2 (two) times daily. 60 tablet 3     Review of Systems  Pertinent pos/neg as indicated in HPI  Blood pressure 124/77, pulse 96, temperature 98 F (36.7 C), temperature source Oral, height 5' 5 (1.651 m), weight 93.8 kg, last menstrual period 05/11/2023. General appearance: alert, cooperative, and appears stated age Lungs: clear to auscultation bilaterally Heart: regular rate and rhythm Abdomen: gravid, soft, non-tender Pelvic: 1/50/vtx -3 per Rolin Revel SNM Extremities: no edema   Fetal monitoring: FHR: 130 bpm, variability: moderate,  Accelerations: Present,  decelerations:  Absent Uterine activity: occasional contractions   Presentation: cephalic by bedside u/s   Prenatal labs: ABO, Rh: O/Positive/-- (03/17 1110) Antibody: Negative (03/17 1110) Rubella: 1.01 (03/17 1110) RPR: Non Reactive (06/25 0818)  HBsAg: Negative (03/17 1110)  HIV: Non Reactive (06/25 0818)  GBS: Negative/-- (08/19 1645)  2hr GTT: 79/117/90  Prenatal Transfer Tool  Maternal Diabetes: No Genetic Screening: Abnormal:  Results: Other: nl NIPS; SMA carrier risk Maternal Ultrasounds/Referrals: Normal Fetal Ultrasounds or other Referrals:  None Maternal Substance Abuse:  No Significant Maternal Medications:  Meds include: Other: started Valtrex  ppx x 2d ago Significant Maternal Lab Results: Group B Strep negative  No results found for this or any previous visit (from the past 24 hours).   Assessment:  [redacted]w[redacted]d  SIUP  G1P0000  Cat I FHR  GBS Negative/-- (08/19 1645)  Plan:  Admit to L&D  IV pain meds/declines epidural at this time  Begin cervical ripening with dual cytotec ; progress to cervical foley/Pit/AROM as appropriate  Anticipate NSVD   Plans to breast and bottle feed  Contraception: undecided  Circumcision: N/A  Rolin SAILOR Kight CNM 02/17/2024, 4:33 AM   CNM attestation:  I have seen and examined this patient; I agree with above documentation in the student nurse midwife's note.   Javeah Loeza is a 28 y.o. G1P0000 here for elective IOL. Denies s/s outbreak; started Valtrex  x 2d ago.  PE: BP (!) 96/53 Comment: laying on side; asymptomatic  Pulse 87   Temp 98 F (36.7 C) (Oral)   Ht 5' 5 (1.651 m)   Wt 93.8 kg   LMP 05/11/2023 (Exact Date)   BMI 34.43 kg/m  Gen: calm comfortable, NAD Resp: normal effort, no distress Abd: gravid  ROS, labs, PMH reviewed  Plan: -Admit to Labor and Delivery -Cervical ripening witih dual cytotec  to start; reassess in 4h and plan with patient to determine next steps (cervical foley/repeat cytotec Addie) -Anticipate vag delivery  Suzen JONETTA Gentry CNM 02/17/2024, 6:10 AM

## 2024-02-17 NOTE — Progress Notes (Signed)
 28 yo g1 @ 40+2 here for elective induction. Hx hsv (one episode 8 years ago, asymptomatic, suppressed), cat 1 tracing, s/p cytotec  and contracting frequently (but not painfully), cervix closed but patient consents to foley and it was placed with ease, will plan to re-dose cytotec  when able until foley exits.

## 2024-02-18 ENCOUNTER — Encounter (HOSPITAL_COMMUNITY): Payer: Self-pay

## 2024-02-18 ENCOUNTER — Encounter (HOSPITAL_COMMUNITY): Payer: Self-pay | Admitting: Obstetrics & Gynecology

## 2024-02-18 ENCOUNTER — Other Ambulatory Visit: Payer: Self-pay

## 2024-02-18 ENCOUNTER — Telehealth: Payer: Self-pay

## 2024-02-18 ENCOUNTER — Inpatient Hospital Stay (HOSPITAL_COMMUNITY): Admitting: Anesthesiology

## 2024-02-18 ENCOUNTER — Inpatient Hospital Stay (HOSPITAL_COMMUNITY)
Admission: AD | Admit: 2024-02-18 | Discharge: 2024-02-19 | Disposition: A | Discharge status: Home / Self Care | Source: Home / Self Care | Attending: Family Medicine | Admitting: Family Medicine

## 2024-02-18 DIAGNOSIS — O411230 Chorioamnionitis, third trimester, not applicable or unspecified: Secondary | ICD-10-CM | POA: Diagnosis present

## 2024-02-18 DIAGNOSIS — O480 Post-term pregnancy: Secondary | ICD-10-CM | POA: Diagnosis present

## 2024-02-18 DIAGNOSIS — Z148 Genetic carrier of other disease: Secondary | ICD-10-CM

## 2024-02-18 DIAGNOSIS — A6000 Herpesviral infection of urogenital system, unspecified: Secondary | ICD-10-CM | POA: Diagnosis present

## 2024-02-18 DIAGNOSIS — J4520 Mild intermittent asthma, uncomplicated: Secondary | ICD-10-CM | POA: Diagnosis present

## 2024-02-18 DIAGNOSIS — Z8249 Family history of ischemic heart disease and other diseases of the circulatory system: Secondary | ICD-10-CM

## 2024-02-18 DIAGNOSIS — O99214 Obesity complicating childbirth: Secondary | ICD-10-CM | POA: Diagnosis present

## 2024-02-18 DIAGNOSIS — A419 Sepsis, unspecified organism: Secondary | ICD-10-CM | POA: Diagnosis present

## 2024-02-18 DIAGNOSIS — Z833 Family history of diabetes mellitus: Secondary | ICD-10-CM

## 2024-02-18 DIAGNOSIS — Z3A40 40 weeks gestation of pregnancy: Secondary | ICD-10-CM

## 2024-02-18 DIAGNOSIS — Z5329 Procedure and treatment not carried out because of patient's decision for other reasons: Secondary | ICD-10-CM | POA: Diagnosis present

## 2024-02-18 DIAGNOSIS — Z3493 Encounter for supervision of normal pregnancy, unspecified, third trimester: Principal | ICD-10-CM

## 2024-02-18 DIAGNOSIS — O9832 Other infections with a predominantly sexual mode of transmission complicating childbirth: Secondary | ICD-10-CM | POA: Diagnosis present

## 2024-02-18 DIAGNOSIS — O9952 Diseases of the respiratory system complicating childbirth: Secondary | ICD-10-CM | POA: Diagnosis present

## 2024-02-18 DIAGNOSIS — Z87891 Personal history of nicotine dependence: Secondary | ICD-10-CM

## 2024-02-18 DIAGNOSIS — O99210 Obesity complicating pregnancy, unspecified trimester: Secondary | ICD-10-CM

## 2024-02-18 LAB — CBC
HCT: 36.7 % (ref 36.0–46.0)
Hemoglobin: 11.8 g/dL — ABNORMAL LOW (ref 12.0–15.0)
MCH: 28.9 pg (ref 26.0–34.0)
MCHC: 32.2 g/dL (ref 30.0–36.0)
MCV: 90 fL (ref 80.0–100.0)
Platelets: 246 K/uL (ref 150–400)
RBC: 4.08 MIL/uL (ref 3.87–5.11)
RDW: 15.6 % — ABNORMAL HIGH (ref 11.5–15.5)
WBC: 11.6 K/uL — ABNORMAL HIGH (ref 4.0–10.5)
nRBC: 0 % (ref 0.0–0.2)

## 2024-02-18 LAB — PROTIME-INR
INR: 1.1 (ref 0.8–1.2)
Prothrombin Time: 14.6 s (ref 11.4–15.2)

## 2024-02-18 LAB — CBC WITH DIFFERENTIAL/PLATELET
Abs Immature Granulocytes: 0.17 K/uL — ABNORMAL HIGH (ref 0.00–0.07)
Basophils Absolute: 0 K/uL (ref 0.0–0.1)
Basophils Relative: 0 %
Eosinophils Absolute: 0 K/uL (ref 0.0–0.5)
Eosinophils Relative: 0 %
HCT: 30.9 % — ABNORMAL LOW (ref 36.0–46.0)
Hemoglobin: 10 g/dL — ABNORMAL LOW (ref 12.0–15.0)
Immature Granulocytes: 1 %
Lymphocytes Relative: 6 %
Lymphs Abs: 1.2 K/uL (ref 0.7–4.0)
MCH: 29.2 pg (ref 26.0–34.0)
MCHC: 32.4 g/dL (ref 30.0–36.0)
MCV: 90.1 fL (ref 80.0–100.0)
Monocytes Absolute: 1.6 K/uL — ABNORMAL HIGH (ref 0.1–1.0)
Monocytes Relative: 8 %
Neutro Abs: 18.2 K/uL — ABNORMAL HIGH (ref 1.7–7.7)
Neutrophils Relative %: 85 %
Platelets: 216 K/uL (ref 150–400)
RBC: 3.43 MIL/uL — ABNORMAL LOW (ref 3.87–5.11)
RDW: 15.5 % (ref 11.5–15.5)
WBC: 21.2 K/uL — ABNORMAL HIGH (ref 4.0–10.5)
nRBC: 0 % (ref 0.0–0.2)

## 2024-02-18 LAB — COMPREHENSIVE METABOLIC PANEL WITH GFR
ALT: 9 U/L (ref 0–44)
AST: 21 U/L (ref 15–41)
Albumin: 2.5 g/dL — ABNORMAL LOW (ref 3.5–5.0)
Alkaline Phosphatase: 154 U/L — ABNORMAL HIGH (ref 38–126)
Anion gap: 11 (ref 5–15)
BUN: <5 mg/dL — ABNORMAL LOW (ref 6–20)
CO2: 20 mmol/L — ABNORMAL LOW (ref 22–32)
Calcium: 9.7 mg/dL (ref 8.9–10.3)
Chloride: 102 mmol/L (ref 98–111)
Creatinine, Ser: 0.63 mg/dL (ref 0.44–1.00)
GFR, Estimated: >60 mL/min (ref >60)
Glucose, Bld: 92 mg/dL (ref 70–99)
Potassium: 3.5 mmol/L (ref 3.5–5.1)
Sodium: 133 mmol/L — ABNORMAL LOW (ref 135–145)
Total Bilirubin: 1.2 mg/dL (ref 0.0–1.2)
Total Protein: 6.2 g/dL — ABNORMAL LOW (ref 6.5–8.1)

## 2024-02-18 LAB — LACTIC ACID, PLASMA
Lactic Acid, Venous: 2.4 mmol/L (ref 0.5–1.9)
Lactic Acid, Venous: 3 mmol/L (ref 0.5–1.9)

## 2024-02-18 LAB — APTT: aPTT: 32 s (ref 24–36)

## 2024-02-18 MED ORDER — OXYTOCIN-SODIUM CHLORIDE 30-0.9 UT/500ML-% IV SOLN
2.5000 [IU]/h | INTRAVENOUS | Status: DC
  Filled 2024-02-18: qty 500

## 2024-02-18 MED ORDER — LIDOCAINE HCL (PF) 1 % IJ SOLN: 30.0000 mL | INTRAMUSCULAR | Status: DC | PRN

## 2024-02-18 MED ORDER — FENTANYL-BUPIVACAINE-NACL 0.5-0.125-0.9 MG/250ML-% EP SOLN
EPIDURAL | Status: AC
  Filled 2024-02-18: qty 250

## 2024-02-18 MED ORDER — EPHEDRINE 5 MG/ML INJ: 10.0000 mg | INTRAVENOUS | Status: DC | PRN

## 2024-02-18 MED ORDER — EPHEDRINE 5 MG/ML INJ
10.0000 mg | INTRAVENOUS | Status: DC | PRN
  Administered 2024-02-18: 10 mg via INTRAVENOUS

## 2024-02-18 MED ORDER — OXYTOCIN BOLUS FROM INFUSION: 333.0000 mL | Freq: Once | INTRAVENOUS | Status: DC

## 2024-02-18 MED ORDER — GENTAMICIN SULFATE 40 MG/ML IJ SOLN
5.0000 mg/kg | INTRAVENOUS | Status: DC
  Administered 2024-02-18: 360 mg via INTRAVENOUS
  Filled 2024-02-18: qty 9

## 2024-02-18 MED ORDER — LACTATED RINGERS IV SOLN
500.0000 mL | Freq: Once | INTRAVENOUS | Status: AC
  Administered 2024-02-18: 500 mL via INTRAVENOUS

## 2024-02-18 MED ORDER — TERBUTALINE SULFATE 1 MG/ML IJ SOLN: 0.2500 mg | Freq: Once | INTRAMUSCULAR | Status: DC | PRN

## 2024-02-18 MED ORDER — LACTATED RINGERS IV SOLN
500.0000 mL | INTRAVENOUS | Status: DC | PRN
  Administered 2024-02-18: 1000 mL via INTRAVENOUS
  Administered 2024-02-18: 500 mL via INTRAVENOUS
  Administered 2024-02-18: 1000 mL via INTRAVENOUS

## 2024-02-18 MED ORDER — PHENYLEPHRINE 80 MCG/ML (10ML) SYRINGE FOR IV PUSH (FOR BLOOD PRESSURE SUPPORT): 80.0000 ug | PREFILLED_SYRINGE | INTRAVENOUS | Status: DC | PRN

## 2024-02-18 MED ORDER — DIPHENHYDRAMINE HCL 50 MG/ML IJ SOLN: 12.5000 mg | INTRAMUSCULAR | Status: DC | PRN

## 2024-02-18 MED ORDER — ONDANSETRON HCL 4 MG/2ML IJ SOLN: 4.0000 mg | Freq: Four times a day (QID) | INTRAMUSCULAR | Status: DC | PRN

## 2024-02-18 MED ORDER — DIPHENHYDRAMINE HCL 50 MG/ML IJ SOLN
12.5000 mg | INTRAMUSCULAR | Status: DC | PRN
  Administered 2024-02-18 (×2): 12.5 mg via INTRAVENOUS
  Filled 2024-02-18 (×2): qty 1

## 2024-02-18 MED ORDER — LACTATED RINGERS IV SOLN
500.0000 mL | Freq: Once | INTRAVENOUS | Status: DC
Start: 2024-02-18 — End: 2024-02-18

## 2024-02-18 MED ORDER — FENTANYL-BUPIVACAINE-NACL 0.5-0.125-0.9 MG/250ML-% EP SOLN: 12.0000 mL/h | EPIDURAL | Status: DC | PRN

## 2024-02-18 MED ORDER — FENTANYL-BUPIVACAINE-NACL 0.5-0.125-0.9 MG/250ML-% EP SOLN
12.0000 mL/h | EPIDURAL | Status: DC | PRN
  Administered 2024-02-18: 12 mL/h via EPIDURAL
  Filled 2024-02-18: qty 250

## 2024-02-18 MED ORDER — ZOLPIDEM TARTRATE 5 MG PO TABS: 5.0000 mg | ORAL_TABLET | Freq: Every evening | ORAL | Status: DC | PRN

## 2024-02-18 MED ORDER — ACETAMINOPHEN 10 MG/ML IV SOLN
1000.0000 mg | Freq: Four times a day (QID) | INTRAVENOUS | Status: DC
  Administered 2024-02-18 (×2): 1000 mg via INTRAVENOUS
  Filled 2024-02-18 (×3): qty 100

## 2024-02-18 MED ORDER — PHENYLEPHRINE 80 MCG/ML (10ML) SYRINGE FOR IV PUSH (FOR BLOOD PRESSURE SUPPORT)
80.0000 ug | PREFILLED_SYRINGE | INTRAVENOUS | Status: AC | PRN
  Administered 2024-02-18 (×3): 80 ug via INTRAVENOUS
  Filled 2024-02-18: qty 10

## 2024-02-18 MED ORDER — OXYTOCIN-SODIUM CHLORIDE 30-0.9 UT/500ML-% IV SOLN
1.0000 m[IU]/min | INTRAVENOUS | Status: DC
  Administered 2024-02-18: 2 m[IU]/min via INTRAVENOUS

## 2024-02-18 MED ORDER — LACTATED RINGERS IV SOLN: INTRAVENOUS | Status: DC

## 2024-02-18 MED ORDER — SODIUM CHLORIDE 0.9 % IV SOLN
2.0000 g | Freq: Four times a day (QID) | INTRAVENOUS | Status: DC
  Administered 2024-02-18 (×2): 2 g via INTRAVENOUS
  Filled 2024-02-18 (×2): qty 2000

## 2024-02-18 MED ORDER — LIDOCAINE HCL (PF) 1 % IJ SOLN
INTRAMUSCULAR | Status: DC | PRN
  Administered 2024-02-18: 8 mL via EPIDURAL

## 2024-02-18 MED ORDER — EPHEDRINE 5 MG/ML INJ
10.0000 mg | INTRAVENOUS | Status: DC | PRN
  Filled 2024-02-18: qty 5

## 2024-02-18 MED ORDER — SOD CITRATE-CITRIC ACID 500-334 MG/5ML PO SOLN
30.0000 mL | ORAL | Status: DC | PRN
  Filled 2024-02-18: qty 30

## 2024-02-18 NOTE — Progress Notes (Signed)
 Called to talk to patient who is refusing epidural with painful contractions.  Wants pitocin  stopped. Feels the epidural will worsen her scoliosis. Demands a cesarean section as this induction process is taking too long.  I cam to talk to patient, but she was angry, saying that nothing was happening. I explained to her that she has gone from 1 cm dilation to 4 cm dilation with ruptured membranes, this is all part of the process and her induction is progressing well.  She had started to have adequate contractions as evidenced by the IUPC, when she demanded that the pitocin  be turned off. She had requested an episdural, but has changed her mind.  I explained to her there was no current indication for cesarean delivery, no maternal/fetal indications and she was very upset about this.  I did inform her that cesarean sections are usually done under spinal anesthesia, done in a similar fashion to an epidural, and she said that no one told her that.  I also discussed that cesarean section was major surgery, and can be associated with serious risks; this is not the easy way out.  Patient is clearly uncomfortable with contractions. I tried to reason with her to accept getting the epidural, as other analgesia did not seem to help her  but she continued to maintain that I know my body. He will mess up my scoliosis despite reassurance from me and her RN and the Anesthesia team.  I did inform her that given that she is already ruptured, she has an increased risk of intrauterine infection which could adversely affect her baby and herself, to which she said then you will give me a cesarean section at least.  Again, I told her, this was not recommended as the way to get this major surgery.  Patient was angry and unwilling to talk to me further, did not agree for pitocin  to be restarted for now.  She still has the IUPC in place, but says she may want this out soon.  Contractions are currently inadequate, patient was  informed of this. Currently has Category 1 FHR tracing. Will continue to monitor patient and FHR monitoring closely.    Of note, women's AC Jenna Henry) was informed of this encounter with the patient.   GLORIS HUGGER, MD, FACOG Obstetrician & Gynecologist, [facility] for [employer], [facility]

## 2024-02-18 NOTE — Anesthesia Procedure Notes (Addendum)
 Epidural Patient location during procedure: OB Start time: 02/18/2024 10:27 AM End time: 02/18/2024 10:42 AM  Staffing Anesthesiologist: Merla Almarie HERO, DO Performed: anesthesiologist   Preanesthetic Checklist Completed: patient identified, IV checked, risks and benefits discussed, monitors and equipment checked, pre-op evaluation and timeout performed  Epidural Patient position: sitting Prep: DuraPrep and site prepped and draped Patient monitoring: continuous pulse ox, blood pressure, heart rate and cardiac monitor Approach: midline Location: L3-L4 Injection technique: LOR air  Needle:  Needle type: Tuohy  Needle gauge: 17 G Needle length: 9 cm Needle insertion depth: 9 cm Catheter type: closed end flexible Catheter size: 19 Gauge Catheter at skin depth: 14 cm Test dose: negative  Assessment Sensory level: T8 Events: blood not aspirated, no cerebrospinal fluid, injection not painful, no injection resistance, no paresthesia and negative IV test  Additional Notes Patient identified. Risks/Benefits/Options discussed with patient including but not limited to bleeding, infection, nerve damage, paralysis, failed block, incomplete pain control, headache, blood pressure changes, nausea, vomiting, reactions to medication both or allergic, itching and postpartum back pain. Confirmed with bedside nurse the patient's most recent platelet count. Confirmed with patient that they are not currently taking any anticoagulation, have any bleeding history or any family history of bleeding disorders. Patient expressed understanding and wished to proceed. All questions were answered. Sterile technique was used throughout the entire procedure. Please see nursing notes for vital signs. Test dose was given through epidural catheter and negative prior to continuing to dose epidural or start infusion. Warning signs of high block given to the patient including shortness of breath, tingling/numbness in  hands, complete motor block, or any concerning symptoms with instructions to call for help. Patient was given instructions on fall risk and not to get out of bed. All questions and concerns addressed with instructions to call with any issues or inadequate analgesia.    Extremely anxious, required extensive counseling from two Rns to maintain positioning. Still moving and jumping throughout procedure. Easy epidural at one level despite very poor cooperation and positioning.  DPE with 24G pencan through tuohy for confirmation of loss, clear CSF, no issues.Reason for block:procedure for pain

## 2024-02-18 NOTE — MAU Note (Addendum)
 Jenna Henry is a 28 y.o. at [redacted]w[redacted]d here in MAU reporting: she's having ctxs every 3-5 minutes apart that are painful.  Denies VB, leaking clear fluid.  Endorses +FM.  Pt left AMA last night from L&D, was an IOL that has ruptured membranes  LMP: 05/11/2023 Onset of complaint: today Pain score: 10 Vitals:   02/18/24 0929  BP: 112/64  Pulse: 84  Resp: 20  Temp: 98.4 F (36.9 C)  SpO2: 100%     FHT: 146 bpm  Lab orders placed from triage: None

## 2024-02-18 NOTE — Anesthesia Procedure Notes (Deleted)
 Epidural Patient location during procedure: OB Start time: 02/18/2024 12:40 AM  Staffing Anesthesiologist: Jefm Garnette LABOR, MD Performed: anesthesiologist   Preanesthetic Checklist Completed: patient identified, IV checked, site marked, risks and benefits discussed, surgical consent, monitors and equipment checked, pre-op evaluation and timeout performed  Epidural Patient position: sitting Prep: DuraPrep and site prepped and draped Patient monitoring: continuous pulse ox and blood pressure Approach: midline Injection technique: LOR air  Needle:  Needle type: Tuohy  Needle gauge: 17 G Needle length: 9 cm and 9 Needle insertion depth: 5 cm cm Catheter type: closed end flexible Catheter size: 19 Gauge Catheter at skin depth: 10 cm Test dose: negative  Assessment Events: blood not aspirated, no cerebrospinal fluid, injection not painful, no injection resistance, no paresthesia and negative IV test  Additional Notes Patient identified. Risks/Benefits/Options discussed with patient including but not limited to bleeding, infection, nerve damage, paralysis, failed block, incomplete pain control, headache, blood pressure changes, nausea, vomiting, reactions to medication both or allergic, itching and postpartum back pain. Confirmed with bedside nurse the patient's most recent platelet count. Confirmed with patient that they are not currently taking any anticoagulation, have any bleeding history or any family history of bleeding disorders. Patient expressed understanding and wished to proceed. All questions were answered. Sterile technique was used throughout the entire procedure. Please see nursing notes for vital signs. Test dose was given through epidural needle and negative prior to continuing to dose epidural or start infusion. Warning signs of high block given to the patient including shortness of breath, tingling/numbness in hands, complete motor block, or any concerning symptoms with  instructions to call for help. Patient was given instructions on fall risk and not to get out of bed. All questions and concerns addressed with instructions to call with any issues.  Attempt (S) . Patient tolerated procedure well.

## 2024-02-18 NOTE — Progress Notes (Signed)
 Pharmacy Antibiotic Note  Jenna Henry is a 28 y.o. female admitted on 02/18/2024 with IOL at [redacted]w[redacted]d.  Pharmacy has been consulted for Gentamicin  dosing for r/o chorioamnionitis due to maternal temp during labor.  Plan: Gentamicin  5mg /kg (360mg  using adjusted bw=71.2kg) IV q24h. Will continue to follow  Height: 5' 5 (165.1 cm) Weight: 92.6 kg (204 lb 3.2 oz) IBW/kg (Calculated) : 57  Temp (24hrs), Avg:98.9 F (37.2 C), Min:98.2 F (36.8 C), Max:102.8 F (39.3 C)  Recent Labs  Lab 02/17/24 0428 02/18/24 0958  WBC 8.2 11.6*    CrCl cannot be calculated (Patient's most recent lab result is older than the maximum 21 days allowed.).    Allergies  Allergen Reactions   Tylenol  [Acetaminophen ] Other (See Comments)    History of Suicide attempt using tylenol . Request to avoid and not offer.    Clindamycin /Lincomycin Itching and Rash   Ortho Tri-Cyclen [Norgestimate -Eth Estradiol ] Hives, Itching, Rash and Other (See Comments)    Caused burning all over    Antimicrobials this admission: Ampicillin  2 gram IV q6h  9/19 >>    Thank you for allowing pharmacy to be a part of this patient's care.  Clayborne Alfonso MATSU 02/18/2024 3:15 PM

## 2024-02-18 NOTE — Anesthesia Preprocedure Evaluation (Addendum)
 Anesthesia Evaluation  Patient identified by MRN, date of birth, ID band Patient awake    Reviewed: Allergy & Precautions, NPO status , Patient's Chart, lab work & pertinent test results  Airway Mallampati: III  TM Distance: >3 FB Neck ROM: Full    Dental no notable dental hx. (+) Teeth Intact, Dental Advisory Given   Pulmonary asthma , former smoker   Pulmonary exam normal breath sounds clear to auscultation       Cardiovascular negative cardio ROS Normal cardiovascular exam Rhythm:Regular Rate:Normal     Neuro/Psych Seizures -, Well Controlled,  PSYCHIATRIC DISORDERS Anxiety Depression       GI/Hepatic negative GI ROS, Neg liver ROS,,,  Endo/Other  BMI 34  Renal/GU negative Renal ROS     Musculoskeletal negative musculoskeletal ROS (+)    Abdominal  (+) + obese  Peds  Hematology Lab Results      Component                Value               Date                      WBC                      8.2                 02/17/2024                HGB                      10.6 (L)            02/17/2024                HCT                      32.7 (L)            02/17/2024                MCV                      90.6                02/17/2024                PLT                      254                 02/17/2024              Anesthesia Other Findings Joo:nmuyn tricyckln, Tylenol , clindamycin   Reproductive/Obstetrics (+) Pregnancy                              Anesthesia Physical Anesthesia Plan  ASA: 3  Anesthesia Plan: Epidural   Post-op Pain Management:    Induction:   PONV Risk Score and Plan:   Airway Management Planned:   Additional Equipment:   Intra-op Plan:   Post-operative Plan:   Informed Consent: I have reviewed the patients History and Physical, chart, labs and discussed the procedure including the risks, benefits and alternatives for the proposed anesthesia with the  patient or authorized representative who has indicated his/her understanding and acceptance.  Plan Discussed with:   Anesthesia Plan Comments: (Initially on L&D overnight, requested epidural but then refused after back was prepped but before starting procedure. Pt then proceeded to leave AMA because induction was happening to slowly for her.  Has returned through MAU about 9h later and now requesting epidural again. Very anxious. )         Anesthesia Quick Evaluation

## 2024-02-18 NOTE — Sepsis Progress Note (Signed)
 Elink monitoring for the code sepsis protocol.

## 2024-02-18 NOTE — Progress Notes (Signed)
 Patient requested epidural, was prepped and sat on side of Bed for Epidural. Dr Jefm was set up for epidural and patient refused. Patient requested to talk to Provider because she requested a C-section.  I called Sherrell, CNM and she referred to Dr. Herchel.   See Dr Herchel note for reference.    Patient made the statement while talk to Dr Herchel, that  She was not going to let the Epidurial mess up her back, because the baby has already messed up her body She stated that she did not care what happened anymore honestly. Patient also stated she did not want to be the patient anymore and requested for IUPC taken out.   IUPC removed and external monitor was placed.   Jenna Nyquist, RN-BSN

## 2024-02-18 NOTE — Progress Notes (Signed)
 Patient ID: Jenna Henry, female   DOB: 09-20-1995, 28 y.o.   MRN: [id number]  BP (!) 84/37   Pulse 88   Temp 100.1 F (37.8 C) (Oral)   Resp 16   Ht 5' 5 (1.651 m)   Wt 204 lb 3.2 oz (92.6 kg)   LMP 05/11/2023 (Exact Date)   SpO2 100%   BMI 33.98 kg/m    Dilation: 4 Effacement (%): 90 Station: -1 Presentation: Vertex Exam by:: KYM Eagles, RNC   Pt's BP trending down despite being remote from initial epidural dosing. Cat 2 tracing for tachycardia with occasional variables. Sepsis protocol initiated for suspected Triple I. Will give a L bolus, then increase fluids to 250ml/hr. MD aware.  Cornell Finder, CNM, MSN, IBCLC Certified Nurse Midwife, [facility]

## 2024-02-18 NOTE — Progress Notes (Signed)
 Patient irritated because she feels her IOL is taking to long. Patient demanded to go home. MD and [facility] notified. AC spoke with patient, patient still insisted on leaving. Dr Herchel and Herman, CNM both notified. I spoke with patient and educated her that she was leaving against the medical advice of the providers. Patient stated understanding. I then educated patient that if she needs to return to the hospital, she will have to go through MAU. Patient stated understanding. AMA form signed by patient and witnessed by myself. PIV and fetal monitors removed, patient left department with support person AMA. Tennie CHRISTELLA Hahn

## 2024-02-18 NOTE — Sepsis Progress Note (Signed)
 Notified provider of repeat lactic acid level 3.0 and need to order repeat lactic acid levels via secure chat.

## 2024-02-18 NOTE — Progress Notes (Signed)
 Patient ID: Jenna Henry, female   DOB: [birth year], 28 y.o.   MRN: [id number]  BP (!) 128/55   Pulse (!) 130   Temp (!) 102.8 F (39.3 C) (Oral)   Resp 16   Ht 5' 5 (1.651 m)   Wt 204 lb 3.2 oz (92.6 kg)   LMP 05/11/2023 (Exact Date)   SpO2 100%   BMI 33.98 kg/m    Pt now febrile and tachycardic, as well as nauseated with 2 episodes of emesis (drank too much juice at once). Has Tylenol  listed as an allergy due to a previous SA, but also verbalized that she tried it twice in pregnancy and it made her itchy all over. Amenable to trying for fever reduction - will premedicate and give IV so it can be stopped if she starts having a reaction.  Amp/gent also ordered, MD aware. Cat 2 tracing (tachycardic but good variability).  Cornell Finder, CNM, MSN, IBCLC Certified Nurse Midwife, [facility]

## 2024-02-18 NOTE — H&P (Signed)
 OBSTETRIC ADMISSION HISTORY AND PHYSICAL  Jenna Henry is a 28 y.o. female G1P0000 with IUP at [redacted]w[redacted]d by LMP presenting for readmission following elective IOL begun yesterday. Patient left AMA this morning shortly after 6 AM following cytotec , foley balloon, and AROM. Returned to MAU a few hours later for readmission. She plans on breastfeeding. She is planning interval BTL for birth control. She received her [facility] at [facility]   Dating: By LMP --->  Estimated Date of Delivery: 02/15/24  Sono:    @[redacted]w[redacted]d , CWD, normal anatomy, breech presentation, EFW 561g 32 %    NURSING  PROVIDER  Conservator, museum/gallery for Women Dating by LMP c/w U/S at 7 wks  [facility] Model Mom-Baby Dyad Anatomy U/S WNL  Initiated care at  10wks                Language  English              LAB RESULTS   Support Person [name]-AJ Mom- Sonny Friend- Danae [employer] NIPS: LR female AFP:  negative    NT/IT (FT only)     Carrier Screen Horizon:   Rhogam  O/Positive/-- (03/17 1110) A1C/GTT Early HgbA1C: passed 2hr Third trimester 2 hr GTT:   Flu Vaccine     TDaP Vaccine  12/29/2023 Blood Type O/Positive/-- (03/17 1110)  RSV Vaccine  Antibody Negative (03/17 1110)  COVID Vaccine  Rubella 1.01 (03/17 1110)  Feeding Plan breast RPR Non Reactive (03/17 1110)  Contraception  Condoms--> interval tubal HBsAg Negative (03/17 1110)  Circumcision No, girl HIV Non Reactive (03/17 1110)  Pediatrician  MBCC HCVAb Non Reactive (03/17 1110)  Prenatal Classes     BTL Consent Signed on 9/10 Pap Diagnosis  Date Value Ref Range Status  10/22/2022   Final   - Negative for intraepithelial lesion or malignancy (NILM)    BTL Pre-payment  GC/CT Initial:   36wks:    VBAC Consent  GBS   For PCN allergy, check sensitivities   BRx Optimized? [ ]  yes   [ ]  no    DME Rx [ x] BP cuff [ X] Weight Scale Waterbirth  [ ]  Class [ ]  Consent [ ]  CNM visit  PHQ9 & GAD7 [  ] new OB [  ] 65 weeks  [  ] 36 weeks Induction  [ ]  Orders Entered [  ]Foley Y/N     Prenatal History/Complications: tobacco use, obesity, mild intermittent asthma  Past Medical History: Past Medical History:  Diagnosis Date   Anxiety    Anxiety state 02/28/2017   Asthma    Chest pain 11/09/2017   Depression    Herpes    Hydradenitis    Insomnia 02/28/2017   Irregular menstruation 07/06/2016   Laceration of left thumb 03/10/2022   Pain in finger of left hand 02/12/2023   Palpitations 11/09/2017   PCOS (polycystic ovarian syndrome)    Polycystic ovaries    Seizures (HCC)    ? 2 wks ago, unknown cause   Past Surgical History: Past Surgical History:  Procedure Laterality Date   ANKLE RECONSTRUCTION Right 10/28/2021   Procedure: RIGHT ANTERIOR TALOFIBULAR LIGAMENT REPAIR AND SYNDESMOSIS FIXATION;  Surgeon: Harden Jerona GAILS, MD;  Location: [facility];  Service: Orthopedics;  Laterality: Right;  regional block   CYSTECTOMY     axillary area per patient   Obstetrical History: OB History     Gravida  1   Para  0   Term  0  Preterm  0   AB  0   Living  0      SAB  0   IAB  0   Ectopic  0   Multiple  0   Live Births  0          Social History Social History   Socioeconomic History   Marital status: Single    Spouse name: Not on file   Number of children: Not on file   Years of education: Not on file   Highest education level: Some college, no degree  Occupational History   Not on file  Tobacco Use   Smoking status: Former    Types: Cigarettes   Smokeless tobacco: Never   Tobacco comments:    Vaping    04/08/2023 Patient smokes marijuana daily  Vaping Use   Vaping status: Former   Substances: Nicotine , Flavoring   Devices: stopped when pregnant  Substance and Sexual Activity   Alcohol use: Not Currently    Alcohol/week: 2.0 standard drinks of alcohol    Types: 2 Cans of beer per week    Comment: occassional -holidays   Drug use: Not Currently    Frequency: 14.0 times per week    Types:  Marijuana    Comment: last was 12/23/23 (morning to help eat breakfast, at night to help sleep)   Sexual activity: Yes    Birth control/protection: None  Other Topics Concern   Not on file  Social History Narrative   Are you right handed or left handed? Right   Are you currently employed ?    What is your current occupation?   Do you live at home alone?   Who lives with you?    What type of home do you live in: 1 story or 2 story?        Social Drivers of Health   Financial Resource Strain: Medium Risk (07/19/2023)   Overall Financial Resource Strain (CARDIA)    Difficulty of Paying Living Expenses: Somewhat hard  Food Insecurity: Food Insecurity Present (02/18/2024)   Hunger Vital Sign    Worried About Running Out of Food in the Last Year: Sometimes true    Ran Out of Food in the Last Year: Sometimes true  Transportation Needs: No Transportation Needs (02/18/2024)   PRAPARE - Administrator, Civil Service (Medical): No    Lack of Transportation (Non-Medical): No  Physical Activity: Insufficiently Active (07/19/2023)   Exercise Vital Sign    Days of Exercise per Week: 2 days    Minutes of Exercise per Session: 10 min  Stress: No Stress Concern Present (07/19/2023)   [employer] of Occupational Health - Occupational Stress Questionnaire    Feeling of Stress : Not at all  Recent Concern: Stress - Stress Concern Present (04/21/2023)   [employer] of Occupational Health - Occupational Stress Questionnaire    Feeling of Stress : To some extent  Social Connections: Moderately Isolated (02/18/2024)   Social Connection and Isolation Panel    Frequency of Communication with Friends and Family: Three times a week    Frequency of Social Gatherings with Friends and Family: Twice a week    Attends Religious Services: Never    Database administrator or Organizations: Yes    Attends Banker Meetings: Never    Marital Status: Never married   Family  History: Family History  Problem Relation Age of Onset   Hyperlipidemia Mother    Depression Mother  Arthritis Mother    Hypertension Mother    Anxiety disorder Mother    Supraventricular tachycardia Mother    Heart disease Father    Heart murmur Father    Hyperlipidemia Maternal Grandmother    Hypertension Maternal Grandfather    Heart disease Maternal Grandfather    Diabetes Maternal Grandfather    COPD Maternal Grandfather    Heart attack Maternal Grandfather    Colon cancer Neg Hx    Esophageal cancer Neg Hx    Rectal cancer Neg Hx    Stomach cancer Neg Hx    Allergies: Allergies  Allergen Reactions   Tylenol  [Acetaminophen ] Other (See Comments)    History of Suicide attempt using tylenol . Request to avoid and not offer.    Clindamycin /Lincomycin Itching and Rash   Ortho Tri-Cyclen [Norgestimate -Eth Estradiol ] Hives, Itching, Rash and Other (See Comments)    Caused burning all over    Medications Prior to Admission  Medication Sig Dispense Refill Last Dose/Taking   albuterol  (VENTOLIN  HFA) 108 (90 Base) MCG/ACT inhaler Inhale 2 puffs into the lungs every 4 (four) hours as needed for wheezing or shortness of breath. (Must have office visit for refills) 6.7 g 2    Blood Pressure Monitoring (BLOOD PRESSURE KIT) DEVI 1 Device by Does not apply route as needed. (Patient not taking: Reported on 02/16/2024) 1 each 0    famotidine  (PEPCID ) 20 MG tablet Take 1 tablet (20 mg total) by mouth 2 (two) times daily. 60 tablet 6    fluconazole  (DIFLUCAN ) 150 MG tablet Take by mouth once to prevent yeast infection (Patient not taking: Reported on 02/09/2024) 1 tablet 0    Misc. Devices (GOJJI WEIGHT SCALE) MISC 1 Device by Does not apply route once a week. (Patient not taking: Reported on 02/16/2024) 1 each 0    Prenatal 27-1 MG TABS Take 1 tablet by mouth daily. 30 tablet 11    valACYclovir  (VALTREX ) 500 MG tablet Take 1 tablet (500 mg total) by mouth 2 (two) times daily. 60 tablet 3       Review of Systems   All systems reviewed and negative except as stated in HPI  Blood pressure (!) 128/55, pulse (!) 130, temperature (!) 102.8 F (39.3 C), temperature source Oral, resp. rate 16, height 5' 5 (1.651 m), weight 92.6 kg, last menstrual period 05/11/2023, SpO2 100%. General appearance: alert, cooperative, and no distress Lungs: normal respiratory rate and effort Heart: regular rate Abdomen: soft, non-tender Extremities: no sign of DVT Presentation: cephalic by SVE  Fetal Tracing: Cat 2 Baseline: 135 Variability: moderate Accelerations: 15x15 Decelerations: occasional late decels Toco: UI Dilation: 4 Effacement (%): 90 Station: 0 Exam by:: KYM Eagles, RNC  Prenatal labs: ABO, Rh: --/--/O POS (09/18 0430) Antibody: NEG (09/18 0430) Rubella: 1.01 (03/17 1110) RPR: NON REACTIVE (09/18 0428)  HBsAg: Negative (03/17 1110)  HIV: Non Reactive (09/18 0428)  GBS: Negative/-- (08/19 1645)    Lab Results  Component Value Date   GBS Negative 01/18/2024   GTT 79,117,90 (11/24/23) Genetic screening LR NIPS, increased carrier risk for SMA Anatomy US  normal anatomy at [redacted]w[redacted]d  Immunization History  Administered Date(s) Administered   Tdap 03/03/2022, 12/29/2023   Prenatal Transfer Tool  Maternal Diabetes: No Genetic Screening: Normal Maternal Ultrasounds/Referrals: Normal Fetal Ultrasounds or other Referrals:  Referred to Materal Fetal Medicine  Maternal Substance Abuse:  No Significant Maternal Medications:  Valtrex  for HSV suppresion Significant Maternal Lab Results: None Number of Prenatal Visits:greater than 3 verified prenatal visits Maternal Vaccinations:TDap  Other Comments:  None   Results for orders placed or performed during the hospital encounter of 02/18/24 (from the past 24 hours)  CBC   Collection Time: 02/18/24  9:58 AM  Result Value Ref Range   WBC 11.6 (H) 4.0 - 10.5 K/uL   RBC 4.08 3.87 - 5.11 MIL/uL   Hemoglobin 11.8 (L) 12.0 - 15.0  g/dL   HCT 63.2 63.9 - 53.9 %   MCV 90.0 80.0 - 100.0 fL   MCH 28.9 26.0 - 34.0 pg   MCHC 32.2 30.0 - 36.0 g/dL   RDW 84.3 (H) 88.4 - 84.4 %   Platelets 246 150 - 400 K/uL   nRBC 0.0 0.0 - 0.2 %    Patient Active Problem List   Diagnosis Date Noted   Labor and delivery, indication for care 02/18/2024   Encounter for elective induction of labor 02/17/2024   Tobacco use 02/14/2024   Carrier of spinal muscular atrophy 08/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 G1P0000 at [redacted]w[redacted]d here to resume elective IOL.  #Labor: Plan to begin Pitocin  2x2 #Pain: Epidural in place #FWB: Cat 2; occasional late decel that resolves with position change #GBS status:  negative #Feeding: Breastmilk  #Reproductive Life planning: Tubal Ligation #Circ:  not applicable  Vernell FORBES Ruddle, Student-MidWife  02/18/2024, 1200

## 2024-02-18 NOTE — Progress Notes (Signed)
 Labor Progress Note Zayna Toste is a 28 y.o. G1P0000 at [redacted]w[redacted]d   S: Sepsis labs drawn. Bps stable. Introduced myself. Discussed IUPC which patient verbally consented to.  O:  BP (!) 102/51 (BP Location: Right Arm)   Pulse 90   Temp 98.5 F (36.9 C) (Oral)   Resp 20   Ht 5' 5 (1.651 m)   Wt 92.6 kg   LMP 05/11/2023 (Exact Date)   SpO2 97%   BMI 33.98 kg/m  Lab Results  Component Value Date   HGB 10.0 (L) 02/18/2024    Time: 9:30 PM  FHT: baseline bpm baseline change from 160 to 150 to 135, moderate variability, accelerations present, one decelerations late,   Contractions: 135 MVU   CVE: Dilation: 4 Effacement (%): 90 Station: 0 Presentation: Vertex Exam by:: Dr. Trudy   A&P: 28 y.o. G1P0000 [redacted]w[redacted]d eIOL, left AMA and re-admitted same day, now septic #Labor: Latent Labor Pitocin  IUPC--titrate to 200 MVUs #Pain: Epidural #FWB: Category II #GBS negative Sepsis, recheck lactic acid (chronic/other problems)  Leeroy KATHEE Trudy, MD 9:30 PM

## 2024-02-18 NOTE — Telephone Encounter (Signed)
 Informed of patient discharged herself from MAU. I was able to reach patient by phone to address any concerns she may have had and asked to proceed back to the MAU for induction of labor. She expressed understanding and had no further questions.

## 2024-02-18 NOTE — Progress Notes (Signed)
 Called by pt RN, because patient upset with slow progress of labor and the amount of pain she is in.  Spoke with patient about how slow 1st baby labors can progress.  Spoke with her about the step for and induction and how to get patient into labor.  Spoke with her about pain control options.  Patient decline to continue on pitocin , patient refused an epidural, patient requested to live [facility].  Spoke with patient about risk of leaving hospital after AROM.  Patient stated she has the same risk at home.  Dr Herchel previously spoke with patient about all of the options and risk of living AMA.  Patient's RN informed Mid wife and MD of patient's decision to leave AMA

## 2024-02-19 ENCOUNTER — Encounter (HOSPITAL_COMMUNITY)
Admission: AD | Disposition: A | Discharge status: Home / Self Care | Payer: Self-pay | Source: Home / Self Care | Attending: Family Medicine

## 2024-02-19 ENCOUNTER — Encounter (HOSPITAL_COMMUNITY): Payer: Self-pay | Admitting: Obstetrics & Gynecology

## 2024-02-19 ENCOUNTER — Other Ambulatory Visit: Payer: Self-pay

## 2024-02-19 DIAGNOSIS — Z3A40 40 weeks gestation of pregnancy: Secondary | ICD-10-CM

## 2024-02-19 DIAGNOSIS — O411230 Chorioamnionitis, third trimester, not applicable or unspecified: Secondary | ICD-10-CM

## 2024-02-19 DIAGNOSIS — O09523 Supervision of elderly multigravida, third trimester: Secondary | ICD-10-CM

## 2024-02-19 DIAGNOSIS — O99214 Obesity complicating childbirth: Secondary | ICD-10-CM

## 2024-02-19 DIAGNOSIS — O99334 Smoking (tobacco) complicating childbirth: Secondary | ICD-10-CM

## 2024-02-19 DIAGNOSIS — O480 Post-term pregnancy: Secondary | ICD-10-CM

## 2024-02-19 LAB — CBC
HCT: 25.2 % — ABNORMAL LOW (ref 36.0–46.0)
Hemoglobin: 8.2 g/dL — ABNORMAL LOW (ref 12.0–15.0)
MCH: 29.4 pg (ref 26.0–34.0)
MCHC: 32.5 g/dL (ref 30.0–36.0)
MCV: 90.3 fL (ref 80.0–100.0)
Platelets: 166 K/uL (ref 150–400)
RBC: 2.79 MIL/uL — ABNORMAL LOW (ref 3.87–5.11)
RDW: 15.9 % — ABNORMAL HIGH (ref 11.5–15.5)
WBC: 18.6 K/uL — ABNORMAL HIGH (ref 4.0–10.5)
nRBC: 0 % (ref 0.0–0.2)

## 2024-02-19 LAB — LACTIC ACID, PLASMA
Lactic Acid, Venous: 2.1 mmol/L (ref 0.5–1.9)
Lactic Acid, Venous: 4 mmol/L (ref 0.5–1.9)

## 2024-02-19 SURGERY — Surgical Case
Anesthesia: Epidural

## 2024-02-19 MED ORDER — TETANUS-DIPHTH-ACELL PERTUSSIS 5-2.5-18.5 LF-MCG/0.5 IM SUSY: 0.5000 mL | PREFILLED_SYRINGE | Freq: Once | INTRAMUSCULAR | Status: DC

## 2024-02-19 MED ORDER — TRANEXAMIC ACID-NACL 1000-0.7 MG/100ML-% IV SOLN
INTRAVENOUS | Status: DC | PRN
  Administered 2024-02-19: 1000 mg via INTRAVENOUS

## 2024-02-19 MED ORDER — FENTANYL CITRATE (PF) 100 MCG/2ML IJ SOLN: 25.0000 ug | INTRAMUSCULAR | Status: DC | PRN

## 2024-02-19 MED ORDER — SENNOSIDES-DOCUSATE SODIUM 8.6-50 MG PO TABS
2.0000 | ORAL_TABLET | Freq: Every day | ORAL | Status: DC
Start: 2024-02-20 — End: 2024-02-20

## 2024-02-19 MED ORDER — WITCH HAZEL-GLYCERIN EX PADS: 1.0000 | MEDICATED_PAD | CUTANEOUS | Status: DC | PRN

## 2024-02-19 MED ORDER — DIPHENHYDRAMINE HCL 25 MG PO CAPS: 25.0000 mg | ORAL_CAPSULE | Freq: Four times a day (QID) | ORAL | Status: DC | PRN

## 2024-02-19 MED ORDER — SODIUM CHLORIDE 0.9% FLUSH: 3.0000 mL | INTRAVENOUS | Status: DC | PRN

## 2024-02-19 MED ORDER — MEASLES, MUMPS & RUBELLA VAC IJ SOLR: 0.5000 mL | Freq: Once | INTRAMUSCULAR | Status: DC

## 2024-02-19 MED ORDER — MEPERIDINE HCL 25 MG/ML IJ SOLN: 6.2500 mg | INTRAMUSCULAR | Status: DC | PRN

## 2024-02-19 MED ORDER — FENTANYL CITRATE (PF) 100 MCG/2ML IJ SOLN
INTRAMUSCULAR | Status: DC | PRN
  Administered 2024-02-19: 100 ug via INTRAVENOUS
  Administered 2024-02-19: 50 ug via INTRAVENOUS

## 2024-02-19 MED ORDER — OXYCODONE HCL 5 MG PO TABS: 5.0000 mg | ORAL_TABLET | ORAL | 0 refills | Status: DC | PRN

## 2024-02-19 MED ORDER — BUPIVACAINE HCL (PF) 0.25 % IJ SOLN
INTRAMUSCULAR | Status: AC
  Filled 2024-02-19: qty 30

## 2024-02-19 MED ORDER — LIDOCAINE-EPINEPHRINE (PF) 2 %-1:200000 IJ SOLN
INTRAMUSCULAR | Status: DC | PRN
  Administered 2024-02-19 (×2): 5 mL via EPIDURAL

## 2024-02-19 MED ORDER — ONDANSETRON HCL 4 MG/2ML IJ SOLN
INTRAMUSCULAR | Status: AC
  Filled 2024-02-19: qty 2

## 2024-02-19 MED ORDER — LIDOCAINE-EPINEPHRINE (PF) 1.5 %-1:200000 IJ SOLN
INTRAMUSCULAR | Status: DC | PRN
  Administered 2024-02-19: 5 mL via EPIDURAL

## 2024-02-19 MED ORDER — OXYTOCIN-SODIUM CHLORIDE 30-0.9 UT/500ML-% IV SOLN
INTRAVENOUS | Status: AC
  Filled 2024-02-19: qty 500

## 2024-02-19 MED ORDER — OXYTOCIN-SODIUM CHLORIDE 30-0.9 UT/500ML-% IV SOLN
2.5000 [IU]/h | INTRAVENOUS | Status: DC
  Administered 2024-02-19: 2.5 [IU]/h via INTRAVENOUS

## 2024-02-19 MED ORDER — MORPHINE SULFATE (PF) 0.5 MG/ML IJ SOLN
INTRAMUSCULAR | Status: AC
  Filled 2024-02-19: qty 10

## 2024-02-19 MED ORDER — OXYCODONE HCL 5 MG PO TABS: 5.0000 mg | ORAL_TABLET | ORAL | Status: DC | PRN

## 2024-02-19 MED ORDER — SIMETHICONE 80 MG PO CHEW
80.0000 mg | CHEWABLE_TABLET | Freq: Three times a day (TID) | ORAL | Status: DC
  Administered 2024-02-19: 80 mg via ORAL
  Filled 2024-02-19 (×2): qty 1

## 2024-02-19 MED ORDER — ONDANSETRON HCL 4 MG/2ML IJ SOLN: 4.0000 mg | Freq: Three times a day (TID) | INTRAMUSCULAR | Status: DC | PRN

## 2024-02-19 MED ORDER — ALBUTEROL SULFATE HFA 108 (90 BASE) MCG/ACT IN AERS: 2.0000 | INHALATION_SPRAY | RESPIRATORY_TRACT | Status: DC | PRN

## 2024-02-19 MED ORDER — ENOXAPARIN SODIUM 40 MG/0.4ML IJ SOSY: 40.0000 mg | PREFILLED_SYRINGE | INTRAMUSCULAR | Status: DC

## 2024-02-19 MED ORDER — FENTANYL CITRATE (PF) 100 MCG/2ML IJ SOLN
INTRAMUSCULAR | Status: AC
  Filled 2024-02-19: qty 2

## 2024-02-19 MED ORDER — TRANEXAMIC ACID-NACL 1000-0.7 MG/100ML-% IV SOLN: 1000.0000 mg | Freq: Once | INTRAVENOUS | Status: DC

## 2024-02-19 MED ORDER — DEXMEDETOMIDINE HCL IN NACL 200 MCG/50ML IV SOLN
INTRAVENOUS | Status: DC | PRN
  Administered 2024-02-19: 8 ug via INTRAVENOUS

## 2024-02-19 MED ORDER — SCOPOLAMINE 1 MG/3DAYS TD PT72
1.0000 | MEDICATED_PATCH | Freq: Once | TRANSDERMAL | Status: DC
  Administered 2024-02-19: 1 mg via TRANSDERMAL

## 2024-02-19 MED ORDER — OXYTOCIN-SODIUM CHLORIDE 30-0.9 UT/500ML-% IV SOLN
INTRAVENOUS | Status: DC | PRN
  Administered 2024-02-19: 30 [IU] via INTRAVENOUS

## 2024-02-19 MED ORDER — KETOROLAC TROMETHAMINE 30 MG/ML IJ SOLN
30.0000 mg | Freq: Four times a day (QID) | INTRAMUSCULAR | Status: DC
  Administered 2024-02-19 (×2): 30 mg via INTRAVENOUS
  Filled 2024-02-19 (×2): qty 1

## 2024-02-19 MED ORDER — ONDANSETRON HCL 4 MG/2ML IJ SOLN
INTRAMUSCULAR | Status: DC | PRN
  Administered 2024-02-19: 4 mg via INTRAVENOUS

## 2024-02-19 MED ORDER — SODIUM CHLORIDE 0.9 % IV SOLN: INTRAVENOUS | Status: DC | PRN

## 2024-02-19 MED ORDER — NALOXONE HCL 0.4 MG/ML IJ SOLN: 0.4000 mg | INTRAMUSCULAR | Status: DC | PRN

## 2024-02-19 MED ORDER — DIBUCAINE (PERIANAL) 1 % EX OINT: 1.0000 | TOPICAL_OINTMENT | CUTANEOUS | Status: DC | PRN

## 2024-02-19 MED ORDER — AMISULPRIDE (ANTIEMETIC) 5 MG/2ML IV SOLN: 10.0000 mg | Freq: Once | INTRAVENOUS | Status: DC | PRN

## 2024-02-19 MED ORDER — NALOXONE HCL 4 MG/10ML IJ SOLN: 1.0000 ug/kg/h | INTRAVENOUS | Status: DC | PRN

## 2024-02-19 MED ORDER — HYDROMORPHONE HCL 1 MG/ML IJ SOLN: 0.2000 mg | INTRAMUSCULAR | Status: DC | PRN

## 2024-02-19 MED ORDER — KETOROLAC TROMETHAMINE 30 MG/ML IJ SOLN: 30.0000 mg | Freq: Once | INTRAMUSCULAR | Status: DC | PRN

## 2024-02-19 MED ORDER — LACTATED RINGERS IV SOLN
INTRAVENOUS | Status: DC | PRN
Start: 2024-02-19 — End: 2024-02-19

## 2024-02-19 MED ORDER — GABAPENTIN 100 MG PO CAPS
100.0000 mg | ORAL_CAPSULE | Freq: Two times a day (BID) | ORAL | Status: DC
  Filled 2024-02-19: qty 1

## 2024-02-19 MED ORDER — AMOXICILLIN-POT CLAVULANATE 875-125 MG PO TABS: 1.0000 | ORAL_TABLET | Freq: Two times a day (BID) | ORAL | 0 refills | Status: AC

## 2024-02-19 MED ORDER — OXYCODONE HCL 5 MG PO TABS: 5.0000 mg | ORAL_TABLET | Freq: Once | ORAL | Status: DC | PRN

## 2024-02-19 MED ORDER — PRENATAL MULTIVITAMIN CH
1.0000 | ORAL_TABLET | Freq: Every day | ORAL | Status: DC
  Filled 2024-02-19: qty 1

## 2024-02-19 MED ORDER — SOD CITRATE-CITRIC ACID 500-334 MG/5ML PO SOLN
30.0000 mL | ORAL | Status: AC
  Administered 2024-02-19: 30 mL via ORAL

## 2024-02-19 MED ORDER — CEFAZOLIN SODIUM-DEXTROSE 2-4 GM/100ML-% IV SOLN: 2.0000 g | INTRAVENOUS | Status: DC

## 2024-02-19 MED ORDER — IBUPROFEN 600 MG PO TABS: 600.0000 mg | ORAL_TABLET | Freq: Four times a day (QID) | ORAL | Status: DC

## 2024-02-19 MED ORDER — SCOPOLAMINE 1 MG/3DAYS TD PT72
MEDICATED_PATCH | TRANSDERMAL | Status: AC
  Filled 2024-02-19: qty 1

## 2024-02-19 MED ORDER — SODIUM CHLORIDE 0.9 % IV SOLN
2.0000 g | Freq: Two times a day (BID) | INTRAVENOUS | Status: AC
Start: 2024-02-19 — End: 2024-02-19
  Filled 2024-02-19: qty 2

## 2024-02-19 MED ORDER — SODIUM CHLORIDE 0.9 % IV SOLN
INTRAVENOUS | Status: AC
  Filled 2024-02-19: qty 5

## 2024-02-19 MED ORDER — DEXAMETHASONE SODIUM PHOSPHATE 4 MG/ML IJ SOLN
INTRAMUSCULAR | Status: DC | PRN
Start: 2024-02-19 — End: 2024-02-19
  Administered 2024-02-19: 8 mg via INTRAVENOUS

## 2024-02-19 MED ORDER — DEXAMETHASONE SODIUM PHOSPHATE 4 MG/ML IJ SOLN
INTRAMUSCULAR | Status: AC
  Filled 2024-02-19: qty 2

## 2024-02-19 MED ORDER — BUPIVACAINE HCL 0.25 % IJ SOLN
INTRAMUSCULAR | Status: DC | PRN
  Administered 2024-02-19: 30 mL

## 2024-02-19 MED ORDER — OXYCODONE HCL 5 MG/5ML PO SOLN: 5.0000 mg | Freq: Once | ORAL | Status: DC | PRN

## 2024-02-19 MED ORDER — SODIUM CHLORIDE 0.9 % IV SOLN: 500.0000 mg | INTRAVENOUS | Status: DC

## 2024-02-19 MED ORDER — ZOLPIDEM TARTRATE 5 MG PO TABS: 5.0000 mg | ORAL_TABLET | Freq: Every evening | ORAL | Status: DC | PRN

## 2024-02-19 MED ORDER — ACETAMINOPHEN 500 MG PO TABS
1000.0000 mg | ORAL_TABLET | Freq: Four times a day (QID) | ORAL | Status: DC
  Administered 2024-02-19 (×2): 1000 mg via ORAL
  Filled 2024-02-19 (×3): qty 2

## 2024-02-19 MED ORDER — MENTHOL 3 MG MT LOZG: 1.0000 | LOZENGE | OROMUCOSAL | Status: DC | PRN

## 2024-02-19 MED ORDER — MORPHINE SULFATE (PF) 0.5 MG/ML IJ SOLN
INTRAMUSCULAR | Status: DC | PRN
  Administered 2024-02-19: 3 mg via EPIDURAL

## 2024-02-19 MED ORDER — SIMETHICONE 80 MG PO CHEW: 80.0000 mg | CHEWABLE_TABLET | ORAL | Status: DC | PRN

## 2024-02-19 MED ORDER — STERILE WATER FOR IRRIGATION IR SOLN
Status: DC | PRN
  Administered 2024-02-19: 1000 mL

## 2024-02-19 MED ORDER — BUPIVACAINE HCL (PF) 0.25 % IJ SOLN: 30.0000 mL | Freq: Once | INTRAMUSCULAR | Status: DC

## 2024-02-19 MED ORDER — DIPHENHYDRAMINE HCL 50 MG/ML IJ SOLN: 12.5000 mg | Freq: Four times a day (QID) | INTRAMUSCULAR | Status: DC | PRN

## 2024-02-19 MED ORDER — COCONUT OIL OIL
1.0000 | TOPICAL_OIL | Status: DC | PRN
  Administered 2024-02-19: 1 via TOPICAL

## 2024-02-19 MED ORDER — LACTATED RINGERS IV SOLN: INTRAVENOUS | Status: DC

## 2024-02-19 SURGICAL SUPPLY — 32 items
BENZOIN TINCTURE PRP APPL 2/3 (GAUZE/BANDAGES/DRESSINGS) ×1 IMPLANT
CHLORAPREP W/TINT 26 (MISCELLANEOUS) ×2 IMPLANT
CLAMP UMBILICAL CORD (MISCELLANEOUS) ×1 IMPLANT
CLOTH BEACON ORANGE TIMEOUT ST (SAFETY) ×1 IMPLANT
DERMABOND ADVANCED .7 DNX12 (GAUZE/BANDAGES/DRESSINGS) IMPLANT
DRSG OPSITE POSTOP 4X10 (GAUZE/BANDAGES/DRESSINGS) ×1 IMPLANT
ELECTRODE REM PT RTRN 9FT ADLT (ELECTROSURGICAL) ×1 IMPLANT
EXTRACTOR VACUUM M CUP 4 TUBE (SUCTIONS) IMPLANT
GAUZE PAD ABD 7.5X8 STRL (GAUZE/BANDAGES/DRESSINGS) IMPLANT
GAUZE SPONGE 4X4 12PLY STRL LF (GAUZE/BANDAGES/DRESSINGS) IMPLANT
GLOVE BIOGEL PI IND STRL 7.0 (GLOVE) ×3 IMPLANT
GLOVE ECLIPSE 7.0 STRL STRAW (GLOVE) ×1 IMPLANT
GOWN STRL REUS W/TWL LRG LVL3 (GOWN DISPOSABLE) ×2 IMPLANT
HEMOSTAT ARISTA ABSORB 3G PWDR (HEMOSTASIS) IMPLANT
KIT ABG SYR 3ML LUER SLIP (SYRINGE) ×1 IMPLANT
MAT PREVALON FULL STRYKER (MISCELLANEOUS) IMPLANT
NDL HYPO 25X5/8 SAFETYGLIDE (NEEDLE) ×1 IMPLANT
NEEDLE HYPO 22GX1.5 SAFETY (NEEDLE) ×1 IMPLANT
NEEDLE HYPO 25X5/8 SAFETYGLIDE (NEEDLE) ×1 IMPLANT
NS IRRIG 1000ML POUR BTL (IV SOLUTION) ×1 IMPLANT
PACK C SECTION WH (CUSTOM PROCEDURE TRAY) ×1 IMPLANT
PAD ABD 7.5X8 STRL (GAUZE/BANDAGES/DRESSINGS) ×1 IMPLANT
PAD OB MATERNITY 4.3X12.25 (PERSONAL CARE ITEMS) ×1 IMPLANT
RTRCTR C-SECT PINK 25CM LRG (MISCELLANEOUS) ×1 IMPLANT
STRIP CLOSURE SKIN 1/2X4 (GAUZE/BANDAGES/DRESSINGS) ×1 IMPLANT
SUT MNCRL 0 VIOLET CTX 36 (SUTURE) ×2 IMPLANT
SUT VIC AB 0 CTX36XBRD ANBCTRL (SUTURE) ×1 IMPLANT
SUT VIC AB 4-0 KS 27 (SUTURE) ×1 IMPLANT
SYR 30ML LL (SYRINGE) ×1 IMPLANT
TOWEL OR 17X24 6PK STRL BLUE (TOWEL DISPOSABLE) ×1 IMPLANT
TRAY FOLEY W/BAG SLVR 14FR LF (SET/KITS/TRAYS/PACK) ×1 IMPLANT
WATER STERILE IRR 1000ML POUR (IV SOLUTION) ×1 IMPLANT

## 2024-02-19 NOTE — Discharge Summary (Signed)
 Postpartum Discharge Summary     Patient Name: Jenna Henry DOB: 06-08-1995 MRN: [id number]  Date of admission: 02/18/2024 Delivery date:02/19/2024 Delivering provider: FREDIRICK GLENYS RAMAN Date of discharge: 02/19/2024  Admitting diagnosis: Labor and delivery, indication for care [O75.9] Intrauterine pregnancy: [redacted]w[redacted]d     Secondary diagnosis:  Active Problems:   Labor and delivery, indication for care  Additional problems: Sepsis, Triple I    Discharge diagnosis: Term Pregnancy Delivered                                              Post partum procedures:IV Abx x 24 hours Augmentation: AROM, Cytotec , and IP Foley Complications: Intrauterine Inflammation or infection (Chorioamniotis)  Hospital course: Induction of Labor With Cesarean Section   28 y.o. yo G1P1001 at [redacted]w[redacted]d was admitted to the hospital 02/18/2024 for induction of labor. Patient had a labor course significant for Cytotec , Foley, AROM pitocin . Patient then left AMA and returned with fever, Triple I. Started on Amp and Gent and Given IV tylenol  and pitocin . Had epidural. Became Hypotensive, lactic acid noted to be 3. Given IVF bolus with improvement in BP. When pitocin  at 26 milliunits and still not in adequate labor by IUPC, decision made to proceed with abdominal delivery. The patient went for cesarean section due to Arrest of Descent. Delivery details are as follows: Membrane Rupture Time/Date: 9:09 PM,02/17/2024  Delivery Method:C-Section, Vacuum Assisted Operative Delivery:Vacuum at C-section Details of operation can be found in separate operative Note.  Patient had a postpartum course complicated by need for Abx x 24 hours.  Her IV infiltrated and these were discontinued after the first dose.  Patient was started on ERAS protocol though she was markedly upset that she was given oral Tylenol  and ibuprofen .  She then started declining all medications and asked to be discharged on postop day #0.  Lengthy conversation was had by myself  with the patient and her significant other to discuss all that had happened, why we recommend antibiotics, how she ended up with infection and sepsis, and why we do the ERAS protocol to improve pain control.  The patient reports that she cannot take Tylenol  because she has tried to overdose on it too many times and that it causes stomach infections.  I tried to make her understand that taking it for short period of time is unlikely to do that. She discussed mistrust in the system and our staff and decided to leave AGAINST MEDICAL ADVICE.  I strongly advised that this is not a good plan of care for her due to the possible need for readmission, poor pain control, increased risk of infection given her sepsis, elevated WBC of 18.6 and most recent lactic acid of 4.0.  She has not received any further antibiotics or IV fluids since her IV infiltrated.  The patient did not have fever or significant pain but I did inform them that infection could be quite indolent as she has not had a complete course of antibiotics.  For that reason I am sending her home with oxycodone  as well as Augmentin  for a 7-day course to try to prevent readmission.  She is ambulating, tolerating a regular diet, passing flatus, and urinating well.  The nurse will sign AMA papers with her 02/19/24.      Newborn Data: Birth date:02/19/2024 Birth time:3:23 AM Gender:Female Living status:Living Apgars:3 ,7  Weight:3340 g                               Magnesium  Sulfate received: No BMZ received: No Rhophylac:N/A MMR:N/A T-DaP:Given prenatally Flu: No RSV Vaccine received: No Transfusion:No  Immunizations received: Immunization History  Administered Date(s) Administered   Tdap 03/03/2022, 12/29/2023    Physical exam  Vitals:   02/19/24 1114 02/19/24 1116 02/19/24 1936 02/19/24 2042  BP: 102/69  114/88 (!) 109/56  Pulse: 100  94 86  Resp:   18 18  Temp:   97.6 F (36.4 C) 97.6 F (36.4 C)  TempSrc:   Oral Oral  SpO2:  97%  98% 98%  Weight:      Height:       General: alert, cooperative, and no distress Lochia: appropriate Uterine Fundus: firm Incision: Healing well with no significant drainage DVT Evaluation: No evidence of DVT seen on physical exam. Labs: Lab Results  Component Value Date   WBC 18.6 (H) 02/19/2024   HGB 8.2 (L) 02/19/2024   HCT 25.2 (L) 02/19/2024   MCV 90.3 02/19/2024   PLT 166 02/19/2024      Latest Ref Rng & Units 02/18/2024    8:23 PM  CMP  Glucose 70 - 99 mg/dL 92   BUN 6 - 20 mg/dL <5   Creatinine 9.55 - 1.00 mg/dL 9.36   Sodium 864 - 854 mmol/L 133   Potassium 3.5 - 5.1 mmol/L 3.5   Chloride 98 - 111 mmol/L 102   CO2 22 - 32 mmol/L 20   Calcium 8.9 - 10.3 mg/dL 9.7   Total Protein 6.5 - 8.1 g/dL 6.2   Total Bilirubin 0.0 - 1.2 mg/dL 1.2   Alkaline Phos 38 - 126 U/L 154   AST 15 - 41 U/L 21   ALT 0 - 44 U/L 9    Edinburgh Score:    02/19/2024    6:08 AM  Edinburgh Postnatal Depression Scale Screening Tool  I have been able to laugh and see the funny side of things. 0  I have looked forward with enjoyment to things. 0  I have blamed myself unnecessarily when things went wrong. 2  I have been anxious or worried for no good reason. 0  I have felt scared or panicky for no good reason. 0  Things have been getting on top of me. 0  I have been so unhappy that I have had difficulty sleeping. 0  I have felt sad or miserable. 0  I have been so unhappy that I have been crying. 0  The thought of harming myself has occurred to me. 0  Edinburgh Postnatal Depression Scale Total 2   Edinburgh Postnatal Depression Scale Total: 2   After visit meds:  Allergies as of 02/19/2024       Reactions   Tylenol  [acetaminophen ] Other (See Comments)   History of Suicide attempt using tylenol . Request to avoid and not offer.    Clindamycin /lincomycin Itching, Rash   Ortho Tri-cyclen [norgestimate -eth Estradiol ] Hives, Itching, Rash, Other (See Comments)   Caused burning all over         Medication List     TAKE these medications    albuterol  108 (90 Base) MCG/ACT inhaler Commonly known as: VENTOLIN  HFA Inhale 2 puffs into the lungs every 4 (four) hours as needed for wheezing or shortness of breath. (Must have office visit for refills)   amoxicillin -clavulanate 875-125 MG tablet Commonly  known as: AUGMENTIN  Take 1 tablet by mouth 2 (two) times daily for 7 days.   Blood Pressure Kit Devi 1 Device by Does not apply route as needed.   famotidine  20 MG tablet Commonly known as: PEPCID  Take 1 tablet (20 mg total) by mouth 2 (two) times daily.   Gojji Weight Scale Misc 1 Device by Does not apply route once a week.   oxyCODONE  5 MG immediate release tablet Commonly known as: Oxy IR/ROXICODONE  Take 1-2 tablets (5-10 mg total) by mouth every 4 (four) hours as needed for moderate pain (pain score 4-6).   Prenatal 27-1 MG Tabs Take 1 tablet by mouth daily.   valACYclovir  500 MG tablet Commonly known as: Valtrex  Take 1 tablet (500 mg total) by mouth 2 (two) times daily.         Discharge home in stable condition Infant Feeding: Breast Infant Disposition:NICU Discharge instruction: per After Visit Summary and Postpartum booklet. Activity: Advance as tolerated. Pelvic rest for 6 weeks.  Diet: routine diet Future Appointments:No future appointments. Follow up Visit:  Follow-up Information     [facility] at [facility] Follow up in 1 week(s).   Specialty: Family Medicine Why: postop check Contact information: [address] [place]  [id number] [phone]                 Please schedule this patient for a In person postpartum visit in 4 weeks with the following provider: MD. Additional Postpartum F/U:Postpartum Depression checkup and Incision check 1 week  Low risk pregnancy complicated by: none Delivery mode:  C-Section, Vacuum Assisted Anticipated Birth Control:   Condoms   02/19/2024 Glenys GORMAN Birk, MD

## 2024-02-19 NOTE — Lactation Note (Signed)
 This note was copied from a baby's chart.  NICU Lactation Consultation Note  Patient Name: Jenna Henry Date: 02/19/2024 Age:26 hours  Reason for consult: Initial assessment; Primapara; 1st time breastfeeding; Other (Comment); Term; NICU baby [facility] use)  SUBJECTIVE Visited with family of 77 16/71 weeks old NICU female' baby A'Lonyee got admitted due to O2 desaturations and to rule out sepsis. Ms. Cassidy is a P1 and reported her plan is to do both breast and formula feeding, baby is currently on NPO. She has already started pumping and getting small droplets of colostrum, praised her for her efforts. She inquired about her Motif pump from home, let her know that we could size her flanges but won't be able to provide education on pumps other than the ones at the hospital. Discussed about getting inserts for her Motif pump at home. Reviewed pumping schedule, pump settings, pumping log, secretory activation, lactogenesis III, CDC and anticipatory guidelines.   OBJECTIVE Infant data: Mother's Current Feeding Choice: -- (NPO)  O2 Device: CPAP O2 Flow Rate (L/min): 4 L/min FiO2 (%): 25 %  Infant feeding assessment IDFTS - Readiness: 5 (cpap6 but awake and crying and takes paci)   Maternal data: G1P1001 C-Section, Vacuum Assisted Has patient been taught Hand Expression?: Yes Hand Expression Comments: colostrum noted Significant Breast History:: moderate breast change during the pregnancy Current breast feeding challenges:: NICU admission Does the patient have breastfeeding experience prior to this delivery?: No Pumping frequency: initiated pumping at 10 hours post-partum Pumped volume: -- (droplets) Flange Size: 18 (might need # 15-16 mm flanges) Hands-free pumping top sizes: -- (has her own pumping bra) Risk factor for low/delayed milk supply:: primipara, infant separation  Pump: Personal (Motif DEBP from insurance)  ASSESSMENT Infant: Feeding Status: NPO Feeding method:  Tube/Gavage (Bolus) Nipple Type: Extra Slow Flow  Maternal: Milk volume: Normal  INTERVENTIONS/PLAN Interventions: Interventions: Breast feeding basics reviewed; Coconut oil; DEBP; Education; CDC milk storage guidelines; CDC Guidelines for Breast Pump Cleaning; NICU Pumping Log; LC Services brochure Tools: Pump; Flanges; Coconut oil; Hands-free pumping top Pump Education: Setup, frequency, and cleaning; Milk Storage  Plan: STS once able to Breast massage, hand expression and coconut oil prior to pumping Pump both breasts in initiate mode every 3 hours for 15 minutes; ideally 8 pumping sessions/24 hours Switch to maintain mode once expressing +20 ml combined or by day 5 Bring all pump pieces to baby's room after her discharge  [name] and visitor present but asleep. All questions and concerns answered, family to contact [facility] services PRN.  Consult Status: NICU follow-up NICU Follow-up type: Maternal D/C visit   Dahir Ayer GORMAN Crate 02/19/2024, 3:24 PM

## 2024-02-19 NOTE — Progress Notes (Signed)
 Pt decided to leave AMA and signed AMA papers after Dr Fredirick came in to have a lengthy talk with her and I had a lengthy talk. Papers signed. Discharged instructions given. She left with her [name].

## 2024-02-19 NOTE — Transfer of Care (Signed)
 Immediate Anesthesia Transfer of Care Note  Patient: Jenna Henry  Procedure(s) Performed: CESAREAN DELIVERY  Patient Location: PACU  Anesthesia Type:Epidural  Level of Consciousness: awake, alert , and oriented  Airway & Oxygen  Therapy: Patient Spontanous Breathing  Post-op Assessment: Report given to RN and Post -op Vital signs reviewed and stable  Post vital signs: Reviewed and stable  Last Vitals:  Vitals Value Taken Time  BP 126/70 02/19/24 04:07  Temp    Pulse 129 02/19/24 04:09  Resp 14 02/19/24 04:09  SpO2 97 % 02/19/24 04:09  Vitals shown include unfiled device data.  Last Pain:  Vitals:   02/19/24 0201  TempSrc: Axillary  PainSc:          Complications: No notable events documented.

## 2024-02-19 NOTE — Progress Notes (Signed)
 Patient ID: Jenna Henry, female   DOB: Dec 13, 1995, 28 y.o.   MRN: [id number] Pt. Has been on pitocin  since returning from leaving the hospital. Pt. With sepsis. Elevated temp, LA, hypotension secondary to Triple I. Pitocin  at 26 mu without any cervical change. FHR is cat 2 and reassuring. Discussed abdominal delivery. Risks include but are not limited to bleeding, infection, injury to surrounding structures, including bowel, bladder and ureters, blood clots, and death.  Likelihood of success is high. Orders placed. Consent signed.

## 2024-02-19 NOTE — Progress Notes (Signed)
 Patient ID: Jenna Henry, female   DOB: [birth year], 28 y.o.   MRN: [id number] I was asked by Nathanel Phlegm RN to talk with patient regarding current post op management specifically ERAS ATC protocol meds of toradol , tylenol  and gabapentin  as well as the ongoing need for post operative antibiotics. There was also concern regarding her diet, ambulation and the need for the catheter.  The patient expressed her concerns that she was getting different information from different providers and she was not being informed about anything so I tried to provide all the information regarding our standard of care approach, most specifically as it relates to her infection and elevated lactic acid pointing to a severe infection causing systemic reaction.  I spent >30 minutes in the room along with Nathanel Phlegm RN talking to the patient, her mom and [name].  I explained each issue and question as fully as I could, circling back several times to reiterate the reasons and I believed answered everyone's concerns and questions fully to their satisfaction.  I left the family to discuss their decisions on ERAS meds, antibiotics, leaving the foley in vs removing it, ambulation vs wheelchair and her ability to go up to the NICU to see the baby.  Vonn VEAR Inch, MD 02/19/2024 11:04 AM

## 2024-02-19 NOTE — Op Note (Signed)
 Preoperative Diagnosis:  IUP @ [redacted]w[redacted]d, arrest of dilation, Suspected Triple I, Sepsis  Postoperative Diagnosis:  Same  Procedure: Primary low transverse cesarean section  Surgeon: Glenys Birk, M.D.  Findings: Viable female infant, APGAR (1 MIN): 3  APGAR (5 MINS): 7  Weight pending, vertex presentation, LOP position  Estimated blood loss: 1000 cc  Complications: None known  Specimens: Placenta to pathology  Reason for procedure: Briefly, the patient is a 28 y.o. G1P0000 [redacted]w[redacted]d who presents for eIOL. Following cytotec , foley and AROM, pt. Left AMA due to length of time it was taking. Upon return, she was febrile to 102.3, had hypotension. She was started on IV tylenol , pitocin , IV Abx, and sepsis protocol was begun. After getting pitocin  to 26 milliunits, she did not have adequate labor and was unchanged from 4 cm. Decision made to proceed with abdominal delivery.  Procedure: Patient is a to the OR where spinal analgesia was administered. She was then placed in a supine position with left lateral tilt. She received 2 g of Ancef  and Azithromycin  and SCDs were in place. A timeout was performed. She was prepped and draped in the usual sterile fashion. A Foley catheter was present in the bladder. A knife was then used to make a Pfannenstiel incision. This incision was carried down to underlying fascia which was divided in the midline with the knife. The incision was extended laterally, bluntly.  The rectus was divided in the midline.  The peritoneal cavity was entered bluntly.  Alexis retractor was placed inside the incision.  A knife was used to make a low transverse incision on the uterus. This incision was carried down to the amniotic cavity was entered. Fetus was in LOP position and was brought up to the incision. There was some difficulty and a vacuum was used to get the head out. Delayed cord clamping x 30 s, and cord was clamped x 2 and cut. Infant taken to waiting nurse.  Cord pH and blood was  obtained. Placenta was delivered from the uterus.  Uterus was cleaned with dry lap pads. Uterine incision closed with 0 Monocryl suture in a locked running fashion. A figure of eight of the same was done at the left apex to achieve hemostasis. Alexis retractor was removed from the abdomen. Peritoneal closure was done with 0 Monocryl suture.  Fascia is closed with 0 Vicryl suture in a running fashion. Subcutaneous tissue infused with 30cc 0.25% Marcaine .  Skin closed using 3-0 Vicryl on a Keith needle.  Honeycomb applied, followed by pressure dressing.  All instrument, needle and lap counts were correct x 2.  Patient was awake and taken to PACU stable.  Infant remained with mom in couplet care, stable.   Glenys RAMAN PrattMD 02/19/2024 3:49 AM

## 2024-02-20 NOTE — Anesthesia Postprocedure Evaluation (Signed)
 Anesthesia Post Note  Patient: Social research officer, government  Procedure(s) Performed: CESAREAN DELIVERY     Patient location during evaluation: PACU Anesthesia Type: Epidural Level of consciousness: awake Pain management: pain level controlled Vital Signs Assessment: post-procedure vital signs reviewed and stable Respiratory status: spontaneous breathing, nonlabored ventilation and respiratory function stable Cardiovascular status: blood pressure returned to baseline and stable Postop Assessment: no apparent nausea or vomiting Anesthetic complications: no   No notable events documented.  Last Vitals:  Vitals:   02/19/24 1936 02/19/24 2042  BP: 114/88 (!) 109/56  Pulse: 94 86  Resp: 18 18  Temp: 36.4 C 36.4 C  SpO2: 98% 98%    Last Pain:  Vitals:   02/19/24 2121  TempSrc:   PainSc: 0-No pain   Pain Goal:                   Bernardino SQUIBB [employer]

## 2024-02-21 ENCOUNTER — Encounter (HOSPITAL_COMMUNITY): Payer: Self-pay | Admitting: Family Medicine

## 2024-02-22 ENCOUNTER — Inpatient Hospital Stay (HOSPITAL_COMMUNITY)

## 2024-02-22 ENCOUNTER — Ambulatory Visit: Payer: Self-pay | Admitting: Family Medicine

## 2024-02-22 LAB — SURGICAL PATHOLOGY

## 2024-02-23 ENCOUNTER — Other Ambulatory Visit: Payer: Self-pay | Admitting: Family Medicine

## 2024-02-23 ENCOUNTER — Telehealth: Payer: Self-pay | Admitting: Family Medicine

## 2024-02-23 DIAGNOSIS — J4520 Mild intermittent asthma, uncomplicated: Secondary | ICD-10-CM

## 2024-02-23 LAB — CULTURE, BLOOD (ROUTINE X 2)
Culture: NO GROWTH
Culture: NO GROWTH

## 2024-02-23 NOTE — Telephone Encounter (Signed)
 Copied from CRM #[id number]. Topic: Clinical - Medication Refill >> Feb 23, 2024  8:37 AM Robinson H wrote: Medication: albuterol  (VENTOLIN  HFA) 108 (90 Base) MCG/ACT inhaler  Has the patient contacted their pharmacy? Yes, was advised to reach out to provider (Agent: If no, request that the patient contact the pharmacy for the refill. If patient does not wish to contact the pharmacy document the reason why and proceed with request.) (Agent: If yes, when and what did the pharmacy advise?)  This is the patient's preferred pharmacy:    [facility] [address], [place] - [address] AT [address] [place] [address] Phone: [phone] Fax: [phone]    Is this the correct pharmacy for this prescription? Yes If no, delete pharmacy and type the correct one.   Has the prescription been filled recently? No  Is the patient out of the medication? Yes  Has the patient been seen for an appointment in the last year OR does the patient have an upcoming appointment? Yes  Can we respond through MyChart? Yes  Agent: Please be advised that Rx refills may take up to 3 business days. We ask that you follow-up with your pharmacy.

## 2024-02-23 NOTE — Telephone Encounter (Signed)
 Patient called to say her incision was hurting, and she thinks the tape is coming off. She is scheduled for Friday.

## 2024-02-24 NOTE — Telephone Encounter (Addendum)
 RN attempted to return pt call regarding incision concerns.  Pt has incision check nurse visit tomorrow 02/25/24 at 08:45.    [place], RN

## 2024-02-25 ENCOUNTER — Ambulatory Visit (INDEPENDENT_AMBULATORY_CARE_PROVIDER_SITE_OTHER): Admitting: *Deleted

## 2024-02-25 ENCOUNTER — Other Ambulatory Visit: Payer: Self-pay

## 2024-02-25 VITALS — BP 99/49 | HR 67 | Ht 65.0 in | Wt 199.2 lb

## 2024-02-25 DIAGNOSIS — Z4889 Encounter for other specified surgical aftercare: Secondary | ICD-10-CM

## 2024-02-25 NOTE — Progress Notes (Addendum)
 Pt presents for incision check following C/S on 02/19/24. Pt is ambulating well. She reports occasional abdominal pain which is managed well with prn Oxycodone . Incision was assessed and found to be healing well. No redness, swelling or drainage was noted. Instructions for proper care and daily cleansing of incision were given. Pt will keep PP appt as scheduled on 10/30.

## 2024-02-26 NOTE — Lactation Note (Signed)
 This note was copied from a baby's chart.  NICU Lactation Consultation Note  Patient Name: Jenna Henry Date: 02/26/2024 Age:26 days  Reason for consult: Weekly NICU follow-up; NICU baby; Primapara; 1st time breastfeeding; Other (Comment); Term (Cord toxicology (+) for [facility])  SUBJECTIVE Visited with family of 74 82/14 weeks old NICU female Jenna Henry; Jenna Henry is a P1 and reported she's been pumping, praised her for her efforts. Noticed that pumping hasn't been consistent, baby has not had any breastmilk feedings yet at 5 days post-partum, she's been on Similac 20 calorie formula. She endorsed leaking of the breasts in the morning but not pain or discomfort (see maternal assessment). She voiced she forgot to bring her pump pieces in baby's room. Opened a new pump kit and set up the pump, asked her to call for assistance when needed.   OBJECTIVE Infant data: Mother's Current Feeding Choice: Breast Milk and Formula  O2 Device: Room Air  Infant feeding assessment IDFTS - Readiness: 2 IDFTS - Quality: 3   Maternal data: G1P1001 C-Section, Vacuum Assisted Pumping frequency: 3 times/24 hours Pumped volume: 50 mL  Pump: Personal (Motif DEBP from insurance)  ASSESSMENT Infant: Feeding Status: Scheduled 9-12-3-6 Feeding method: Bottle; Tube/Gavage (Bolus) Nipple Type: Dr. Jonna Fling Preemie  Maternal: Milk volume: Low No S/S of engorgement at this time  INTERVENTIONS/PLAN Interventions: Interventions: Breast feeding basics reviewed; Coconut oil; DEBP; Education Discharge Education: Engorgement and breast care  Plan: STS around care times Pump both breasts on maintain mode every 3 hours for 15-30 minutes; ideally 8 pumping sessions/24 hours   [name] and female visitor present; noticed a strong smell of marijuana in the room. All questions and concerns answered, family to contact [facility] services PRN.  Consult Status: NICU follow-up NICU Follow-up type: Weekly NICU follow  up   Makayli Bracken GORMAN Crate 02/26/2024, 4:49 PM

## 2024-02-29 MED ORDER — ALBUTEROL SULFATE HFA 108 (90 BASE) MCG/ACT IN AERS: 2.0000 | INHALATION_SPRAY | RESPIRATORY_TRACT | 2 refills | Status: AC | PRN

## 2024-03-02 NOTE — Lactation Note (Signed)
 This note was copied from a baby's chart.  NICU Lactation Consultation Note  Patient Name: Jenna Henry Date: 03/02/2024 Age:26 days  Reason for consult: Weekly NICU follow-up; NICU baby; Primapara; 1st time breastfeeding; Other (Comment); Term (cord toxicology (+) for [facility])  SUBJECTIVE Visited with family of 62 8/64 weeks old NICU female Jenna Henry; NICU RN Jenna Henry reported that Jenna Henry has already left to go to her [facility] appt; parents are taking baby Jenna Henry home today. Spoke with Jenna Henry over the phone and she voiced she's been pumping more consistent since she started feeding better; but she's still not making the 8 pumping sessions/24 hours, praised her for all her efforts. Noticed that baby has been on 100% Similac formula during NICU stay, Jenna Henry said she's been freezing her milk at home. She politely declined a referral to [facility] OP as the Peds office where baby will be going has a LC on site in case any questions or concerns arise. Encouraged pumping every 3 hours or whenever baby is taking a bottle. [name] present and supportive. All questions and concerns answered, family to contact [facility] services PRN.  OBJECTIVE Infant data: Mother's Current Feeding Choice: Breast Milk and Formula  O2 Device: Room Air  Infant feeding assessment IDFTS - Readiness: 2 IDFTS - Quality: 2   Maternal data: G1P1001 C-Section, Vacuum Assisted Pumping frequency: 5-6 times/24 hours Pumped volume: 60 mL  Pump: Personal (Motif DEBP from insurance)  ASSESSMENT Infant: Feeding Status: Ad lib Feeding method: Bottle Nipple Type: Dr. Jonna Preemie  Maternal: Milk volume: Low  INTERVENTIONS/PLAN Interventions: Interventions: Education Discharge Education: Outpatient recommendation Tools: Bottle  Plan: Consult Status: Complete   Kayman Snuffer S Alliyah Roesler 03/02/2024, 4:04 PM

## 2024-03-04 ENCOUNTER — Encounter (HOSPITAL_COMMUNITY): Payer: Self-pay

## 2024-03-04 ENCOUNTER — Ambulatory Visit (HOSPITAL_COMMUNITY): Admission: EM | Admit: 2024-03-04 | Discharge: 2024-03-04 | Disposition: A | Discharge status: Home / Self Care

## 2024-03-04 DIAGNOSIS — N3001 Acute cystitis with hematuria: Secondary | ICD-10-CM | POA: Insufficient documentation

## 2024-03-04 DIAGNOSIS — R519 Headache, unspecified: Secondary | ICD-10-CM | POA: Diagnosis present

## 2024-03-04 DIAGNOSIS — R509 Fever, unspecified: Secondary | ICD-10-CM | POA: Insufficient documentation

## 2024-03-04 LAB — POCT URINALYSIS DIP (MANUAL ENTRY)
Bilirubin, UA: NEGATIVE
Glucose, UA: NEGATIVE mg/dL
Ketones, POC UA: NEGATIVE mg/dL
Nitrite, UA: NEGATIVE
Protein Ur, POC: NEGATIVE mg/dL
Spec Grav, UA: 1.01 (ref 1.010–1.025)
Urobilinogen, UA: 0.2 U/dL
pH, UA: 7 (ref 5.0–8.0)

## 2024-03-04 LAB — POC SOFIA SARS ANTIGEN FIA: SARS Coronavirus 2 Ag: NEGATIVE

## 2024-03-04 MED ORDER — SULFAMETHOXAZOLE-TRIMETHOPRIM 800-160 MG PO TABS: 1.0000 | ORAL_TABLET | Freq: Two times a day (BID) | ORAL | 0 refills | Status: AC

## 2024-03-04 NOTE — ED Triage Notes (Signed)
 Patient presents with fever and chills that started 1-2 weeks ago. Patient states her c-section incision is sore. Patient stop taking her medication for pain.

## 2024-03-04 NOTE — Discharge Instructions (Signed)
 If you experience fever 100.5 or above, nausea or vomiting, lower back pain, or increased abdominal pain that is the reason report to the ER immediately.

## 2024-03-04 NOTE — ED Provider Notes (Signed)
 [facility]    CSN: [id number] Arrival date & time: 03/04/24  1014      History   Chief Complaint Chief Complaint  Patient presents with   Fever   Chills    HPI Jenna Henry is a 28 y.o. female.   Patient presents today, 2 weeks post C-section, due to headaches, chills, subjective fever, and lower abdominal pain.  Patient states that she is eating and drinking within normal limits.  Patient states that she was taking ibuprofen  for pain but discontinued.  Patient denies dysuria or urinary frequency, back pain, nausea or vomiting.   Fever   Past Medical History:  Diagnosis Date   Anxiety    Anxiety state 02/28/2017   Asthma    Chest pain 11/09/2017   Depression    Herpes    Hydradenitis    Insomnia 02/28/2017   Irregular menstruation 07/06/2016   Laceration of left thumb 03/10/2022   Pain in finger of left hand 02/12/2023   Palpitations 11/09/2017   PCOS (polycystic ovarian syndrome)    Polycystic ovaries    Seizures (HCC)    ? 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   Supervision of low-risk pregnancy 07/20/2023   Obesity in pregnancy 07/20/2023   Obesity (BMI 30.0-34.9) 04/26/2023   Fatty liver 11/17/2022   Hydradenitis 06/23/2022   PCOS (polycystic ovarian syndrome) 06/23/2022   Mild intermittent asthma 06/23/2022   MDD (major depressive disorder) 10/10/2018   Herpes 11/12/2016    Past Surgical History:  Procedure Laterality Date   ANKLE RECONSTRUCTION Right 10/28/2021   Procedure: RIGHT ANTERIOR TALOFIBULAR LIGAMENT REPAIR AND SYNDESMOSIS FIXATION;  Surgeon: Harden Jerona GAILS, MD;  Location: [facility];  Service: Orthopedics;  Laterality: Right;  regional block   CESAREAN SECTION N/A 02/19/2024   Procedure:  CESAREAN DELIVERY;  Surgeon: Fredirick Glenys RAMAN, MD;  Location: [facility];  Service: Obstetrics;  Laterality: N/A;   CYSTECTOMY     axillary area per patient    OB History     Gravida  1   Para  1   Term  1   Preterm  0   AB  0   Living  1      SAB  0   IAB  0   Ectopic  0   Multiple  0   Live Births  1            Home Medications    Prior to Admission medications   Medication Sig Start Date End Date Taking? Authorizing Provider  albuterol  (VENTOLIN  HFA) 108 (90 Base) MCG/ACT inhaler Inhale 2 puffs into the lungs every 4 (four) hours as needed for wheezing or shortness of breath. (Must have office visit for refills) 02/29/24  Yes Ozell Heron HERO, MD  Blood Pressure Monitoring (BLOOD PRESSURE KIT) DEVI 1 Device by Does not apply route as needed. 07/20/23  Yes Warren-Hill, Camie LABOR, CNM  famotidine  (PEPCID ) 20 MG tablet Take 1 tablet (20 mg total) by mouth 2 (two) times daily. 11/24/23  Yes Eldonna Suzen Octave, MD  Misc. Devices (GOJJI WEIGHT SCALE) MISC 1 Device by Does not apply route once a week. 07/20/23  Yes Warren-Hill, Camie LABOR, CNM  oxyCODONE  (OXY IR/ROXICODONE ) 5 MG immediate release tablet  Take 1-2 tablets (5-10 mg total) by mouth every 4 (four) hours as needed for moderate pain (pain score 4-6). 02/19/24  Yes Fredirick Glenys RAMAN, MD  Prenatal 27-1 MG TABS Take 1 tablet by mouth daily. 07/20/23  Yes Warren-Hill, Camie LABOR, CNM  sulfamethoxazole -trimethoprim  (BACTRIM  DS) 800-160 MG tablet Take 1 tablet by mouth 2 (two) times daily for 5 days. 03/04/24 03/09/24 Yes Andra Corean BROCKS, PA-C  valACYclovir  (VALTREX ) 500 MG tablet Take 1 tablet (500 mg total) by mouth 2 (two) times daily. 12/29/23  Yes Cresenzo, [name], MD    Family History Family History  Problem Relation Age of Onset   Hyperlipidemia Mother    Depression Mother    Arthritis Mother    Hypertension Mother    Anxiety disorder Mother    Supraventricular tachycardia Mother    Heart disease Father    Heart  murmur Father    Hyperlipidemia Maternal Grandmother    Hypertension Maternal Grandfather    Heart disease Maternal Grandfather    Diabetes Maternal Grandfather    COPD Maternal Grandfather    Heart attack Maternal Grandfather    Colon cancer Neg Hx    Esophageal cancer Neg Hx    Rectal cancer Neg Hx    Stomach cancer Neg Hx     Social History Social History   Tobacco Use   Smoking status: Former    Types: Cigarettes   Smokeless tobacco: Never   Tobacco comments:    Vaping    04/08/2023 Patient smokes marijuana daily  Vaping Use   Vaping status: Former   Substances: Nicotine , Flavoring   Devices: stopped when pregnant  Substance Use Topics   Alcohol use: Not Currently    Alcohol/week: 2.0 standard drinks of alcohol    Types: 2 Cans of beer per week    Comment: occassional -holidays   Drug use: Not Currently    Frequency: 14.0 times per week    Types: Marijuana    Comment: last was 12/23/23 (morning to help eat breakfast, at night to help sleep)     Allergies   Tylenol  [acetaminophen ], Clindamycin /lincomycin, and Ortho tri-cyclen [norgestimate -eth estradiol ]   Review of Systems Review of Systems  Constitutional:  Positive for fever.     Physical Exam Triage Vital Signs ED Triage Vitals  Encounter Vitals Group     BP 03/04/24 1049 132/85     Girls Systolic BP Percentile --      Girls Diastolic BP Percentile --      Boys Systolic BP Percentile --      Boys Diastolic BP Percentile --      Pulse Rate 03/04/24 1049 61     Resp 03/04/24 1049 18     Temp 03/04/24 1049 98.2 F (36.8 C)     Temp Source 03/04/24 1049 Oral     SpO2 03/04/24 1049 97 %     Weight --      Height --      Head Circumference --      Peak Flow --      Pain Score 03/04/24 1053 6     Pain Loc --      Pain Education --      Exclude from Growth Chart --    No data found.  Updated Vital Signs BP 132/85 (BP Location: Left Arm)   Pulse 61   Temp 98.2 F (36.8 C) (Oral)   Resp 18    LMP 05/11/2023 (Exact Date) Comment: Currently on cycle.  SpO2  97%   Breastfeeding No   Visual Acuity Right Eye Distance:   Left Eye Distance:   Bilateral Distance:    Right Eye Near:   Left Eye Near:    Bilateral Near:     Physical Exam Vitals and nursing note reviewed.  Constitutional:      General: She is not in acute distress.    Appearance: Normal appearance. She is not ill-appearing, toxic-appearing or diaphoretic.  Eyes:     General: No scleral icterus. Cardiovascular:     Rate and Rhythm: Normal rate and regular rhythm.     Heart sounds: Normal heart sounds.  Pulmonary:     Effort: Pulmonary effort is normal. No respiratory distress.     Breath sounds: Normal breath sounds. No wheezing or rhonchi.  Abdominal:     General: Abdomen is flat. Bowel sounds are normal.     Palpations: Abdomen is soft.     Tenderness: There is abdominal tenderness in the right upper quadrant, right lower quadrant, suprapubic area, left upper quadrant and left lower quadrant. There is no right CVA tenderness or left CVA tenderness.     Comments: C-section incision appears to be healing appropriately, no surrounding erythema, dehiscence, or purulent drainage noted.  Skin:    General: Skin is warm.  Neurological:     Mental Status: She is alert and oriented to person, place, and time.  Psychiatric:        Mood and Affect: Mood normal.        Behavior: Behavior normal.      UC Treatments / Results  Labs (all labs ordered are listed, but only abnormal results are displayed) Labs Reviewed  POCT URINALYSIS DIP (MANUAL ENTRY) - Abnormal; Notable for the following components:      Result Value   Color, UA light yellow (*)    Clarity, UA cloudy (*)    Blood, UA large (*)    Leukocytes, UA Large (3+) (*)    All other components within normal limits  URINE CULTURE  POC SOFIA SARS ANTIGEN FIA    EKG   Radiology No results found.  Procedures Procedures (including critical care  time)  Medications Ordered in UC Medications - No data to display  Initial Impression / Assessment and Plan / UC Course  I have reviewed the triage vital signs and the nursing notes.  Pertinent labs & imaging results that were available during my care of the patient were reviewed by me and considered in my medical decision making (see chart for details).     UTI with hematuria-patient does not appear to be in acute distress on interview and vital signs are stable.  Do not believe that she is having postop C-section complications.  Patient prescribed Bactrim  DS twice daily for 5 days and urine culture sent to lab. Final Clinical Impressions(s) / UC Diagnoses   Final diagnoses:  Fever, unspecified  Acute nonintractable headache, unspecified headache type  Acute cystitis with hematuria     Discharge Instructions      If you experience fever 100.5 or above, nausea or vomiting, lower back pain, or increased abdominal pain that is the reason report to the ER immediately.    ED Prescriptions     Medication Sig Dispense Auth. Provider   sulfamethoxazole -trimethoprim  (BACTRIM  DS) 800-160 MG tablet Take 1 tablet by mouth 2 (two) times daily for 5 days. 10 tablet Andra Corean BROCKS, PA-C      PDMP not reviewed this encounter.   Andra Corean  C, PA-C 03/04/24 1145

## 2024-03-05 LAB — URINE CULTURE: Culture: NO GROWTH

## 2024-03-06 ENCOUNTER — Ambulatory Visit (HOSPITAL_COMMUNITY): Payer: Self-pay

## 2024-03-24 ENCOUNTER — Other Ambulatory Visit: Payer: Self-pay

## 2024-03-24 ENCOUNTER — Encounter: Payer: Self-pay | Admitting: Family Medicine

## 2024-03-24 ENCOUNTER — Ambulatory Visit: Admitting: Family Medicine

## 2024-03-24 DIAGNOSIS — Z3009 Encounter for other general counseling and advice on contraception: Secondary | ICD-10-CM | POA: Insufficient documentation

## 2024-03-24 DIAGNOSIS — Z98891 History of uterine scar from previous surgery: Secondary | ICD-10-CM | POA: Insufficient documentation

## 2024-03-24 NOTE — Progress Notes (Signed)
 Post Partum Visit Note  Jenna Henry is a 28 y.o. G10P1001 female who presents for a postpartum visit. She is 4 weeks postpartum following a primary cesarean section.  I have fully reviewed the prenatal and intrapartum course. The delivery was at [redacted]w[redacted]d gestational weeks.  Anesthesia: spinal. Postpartum course has been good. Baby is doing well. Baby is feeding by bottle - Similac Sensitive RS. Bleeding no bleeding. Bowel function is normal. Bladder function is normal. Patient is not sexually active. Contraception method is abstinence. Postpartum depression screening: negative.   The pregnancy intention screening data noted above was reviewed. Potential methods of contraception were discussed. The patient elected to proceed with No data recorded.   Edinburgh Postnatal Depression Scale - 03/24/24 0954       Edinburgh Postnatal Depression Scale:  In the Past 7 Days   I have been able to laugh and see the funny side of things. 0    I have looked forward with enjoyment to things. 0    I have blamed myself unnecessarily when things went wrong. 0    I have been anxious or worried for no good reason. 0    I have felt scared or panicky for no good reason. 0    Things have been getting on top of me. 0    I have been so unhappy that I have had difficulty sleeping. 0    I have felt sad or miserable. 0    I have been so unhappy that I have been crying. 0    The thought of harming myself has occurred to me. 0    Edinburgh Postnatal Depression Scale Total 0          Health Maintenance Due  Topic Date Due   HPV VACCINES (1 - 3-dose series) Never done   Pneumococcal Vaccine (1 of 2 - PCV) Never done   Hepatitis B Vaccines 19-59 Average Risk (1 of 3 - 19+ 3-dose series) Never done   Influenza Vaccine  Never done   COVID-19 Vaccine (1 - 2025-26 season) Never done    The following portions of the patient's history were reviewed and updated as appropriate: allergies, current medications, past  family history, past medical history, past social history, past surgical history, and problem list.  Review of Systems Pertinent items noted in HPI and remainder of comprehensive ROS otherwise negative.  Objective:  BP 106/74   Wt 184 lb 5 oz (83.6 kg)   LMP 05/11/2023 (Exact Date) Comment: Currently on cycle.  Breastfeeding No   BMI 30.67 kg/m    General:  alert, cooperative, and appears stated age   Breasts:  not indicated  Lungs: Comfortalbe on room air  Wound well approximated incision  GU exam:  not indicated        Assessment:   Postpartum care and examination  History of cesarean delivery  Unwanted fertility  Normal postpartum exam.   Plan:   Essential components of care per ACOG recommendations:  1.  Mood and well being: Patient with negative depression screening today. Reviewed local resources for support.  - Patient tobacco use? No.   - hx of drug use? No.    2. Infant care and feeding:  -Patient currently breastmilk feeding? No.  -Social determinants of health (SDOH) reviewed in EPIC. No concerns  3. Sexuality, contraception and birth spacing - Patient does not want a pregnancy in the next year.  Desired family size is 1 children.  - Reviewed reproductive life planning.  Reviewed contraceptive methods based on pt preferences and effectiveness.  Patient desired bilateral tubal ligation.   - Discussed birth spacing of 18 months  4. Sleep and fatigue -Encouraged family/partner/community support of 4 hrs of uninterrupted sleep to help with mood and fatigue  5. Physical Recovery  - Discussed patients delivery and complications. She describes her labor as bad. - Patient had a C-section failure to progress.  - Patient has urinary incontinence? No. - Patient is safe to resume physical and sexual activity  6.  Health Maintenance - HM due items addressed No - up to date - Last pap smear  Diagnosis  Date Value Ref Range Status  10/22/2022   Final   -  Negative for intraepithelial lesion or malignancy (NILM)   Pap smear not done at today's visit.  -Breast Cancer screening indicated? No.   7. Chronic Disease/Pregnancy Condition follow up:  1. Postpartum care and examination   2. History of cesarean delivery   3. Unwanted fertility    3. Still strongly desires BTL. Will schedule f/u visit with OBGYN surgeon to discuss and schedule, BTL consent signed 02/09/2024 and I discussed with patient it is only valid for 6 months.   - PCP follow up   Jenna CHRISTELLA Carolus, MD/MPH Attending Family Medicine Physician, [facility] for [facility], [facility]

## 2024-03-30 ENCOUNTER — Ambulatory Visit: Admitting: Obstetrics and Gynecology

## 2024-04-21 ENCOUNTER — Other Ambulatory Visit: Payer: Self-pay

## 2024-04-21 ENCOUNTER — Encounter: Payer: Self-pay | Admitting: Obstetrics and Gynecology

## 2024-04-21 ENCOUNTER — Ambulatory Visit: Admitting: Obstetrics and Gynecology

## 2024-04-21 VITALS — BP 118/82 | HR 71 | Wt 187.4 lb

## 2024-04-21 DIAGNOSIS — Z5321 Procedure and treatment not carried out due to patient leaving prior to being seen by health care provider: Secondary | ICD-10-CM

## 2024-04-21 NOTE — Progress Notes (Signed)
 Patient left without being seen.

## 2024-05-21 ENCOUNTER — Encounter (HOSPITAL_COMMUNITY): Payer: Self-pay | Admitting: Emergency Medicine

## 2024-05-21 ENCOUNTER — Ambulatory Visit (HOSPITAL_COMMUNITY)
Admission: EM | Admit: 2024-05-21 | Discharge: 2024-05-21 | Disposition: A | Discharge status: Home / Self Care | Attending: Emergency Medicine | Admitting: Emergency Medicine

## 2024-05-21 DIAGNOSIS — L732 Hidradenitis suppurativa: Secondary | ICD-10-CM

## 2024-05-21 DIAGNOSIS — Z3202 Encounter for pregnancy test, result negative: Secondary | ICD-10-CM | POA: Diagnosis not present

## 2024-05-21 LAB — POCT URINE PREGNANCY: Preg Test, Ur: NEGATIVE

## 2024-05-21 MED ORDER — DOXYCYCLINE HYCLATE 100 MG PO TABS: 100.0000 mg | ORAL_TABLET | Freq: Two times a day (BID) | ORAL | 0 refills | Status: DC

## 2024-05-21 MED ORDER — IBUPROFEN 800 MG PO TABS: 800.0000 mg | ORAL_TABLET | Freq: Three times a day (TID) | ORAL | 0 refills | Status: DC

## 2024-05-21 NOTE — ED Provider Notes (Signed)
 " [facility]    CSN: [id number] Arrival date & time: 05/21/24  9180      History   Chief Complaint Chief Complaint  Patient presents with   Abscess    HPI Jenna Henry is a 28 y.o. female.   Patient presents to clinic with concern over draining abscess to the right axillae x 4-5 days   Patient with history of HS and was advised to take doxycycline  during her pregnancy but she did not do this She currently has a 30-month-old at home and has had a lot of trouble trying to pick him up and just being a mom and doing daily tasks due to the painful abscess The skin in her axilla is raw and inflamed  Abscess has been draining Patient has not had any fever Has been using hot / cold compress  Feels like this is the worst abscess she has ever had  The history is provided by the patient and medical records.  Abscess   Past Medical History:  Diagnosis Date   Anxiety    Anxiety state 02/28/2017   Asthma    Chest pain 11/09/2017   Depression    Herpes    Hydradenitis    Insomnia 02/28/2017   Irregular menstruation 07/06/2016   Laceration of left thumb 03/10/2022   Pain in finger of left hand 02/12/2023   Palpitations 11/09/2017   PCOS (polycystic ovarian syndrome)    Polycystic ovaries    Seizures (HCC)    ? 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   Obesity (BMI 30.0-34.9) 04/26/2023   Fatty liver 11/17/2022   Hydradenitis 06/23/2022   PCOS (polycystic ovarian syndrome) 06/23/2022   Mild intermittent asthma 06/23/2022   MDD (major depressive disorder) 10/10/2018   Herpes 11/12/2016    Past Surgical History:  Procedure Laterality Date   ANKLE RECONSTRUCTION Right 10/28/2021   Procedure: RIGHT ANTERIOR TALOFIBULAR LIGAMENT REPAIR AND SYNDESMOSIS FIXATION;  Surgeon: Harden Jerona GAILS, MD;  Location: [facility];  Service: Orthopedics;  Laterality: Right;  regional block   CESAREAN SECTION N/A 02/19/2024   Procedure: CESAREAN DELIVERY;  Surgeon: Fredirick Glenys RAMAN, MD;  Location: [facility];  Service: Obstetrics;  Laterality: N/A;   CYSTECTOMY     axillary area per patient    OB History     Gravida  1   Para  1   Term  1   Preterm  0   AB  0   Living  1      SAB  0   IAB  0   Ectopic  0   Multiple  0   Live Births  1            Home Medications    Prior to Admission medications  Medication Sig Start Date End Date Taking? Authorizing Provider  doxycycline  (VIBRA -TABS) 100 MG tablet Take 1 tablet (100 mg total) by mouth 2 (two) times daily for 7 days. 05/21/24 05/28/24 Yes Ammie Warrick  N, FNP  ibuprofen  (ADVIL ) 800 MG tablet Take 1 tablet (800 mg total) by mouth 3 (three) times daily. 05/21/24  Yes Ayodele Hartsock  N, FNP  albuterol  (VENTOLIN  HFA) 108 (90 Base) MCG/ACT inhaler Inhale 2 puffs into the lungs every 4 (four) hours as needed for wheezing or  shortness of breath. (Must have office visit for refills) 02/29/24   Ozell Heron HERO, MD  amoxicillin -clavulanate (AUGMENTIN ) 875-125 MG tablet Take 1 tablet by mouth. Patient not taking: Reported on 04/21/2024 02/20/24   [provider]  Blood Pressure Monitoring (BLOOD PRESSURE KIT) DEVI 1 Device by Does not apply route as needed. Patient not taking: Reported on 04/21/2024 07/20/23   Regino Camie LABOR, CNM  famotidine  (PEPCID ) 20 MG tablet Take 1 tablet (20 mg total) by mouth 2 (two) times daily. Patient not taking: Reported on 04/21/2024 11/24/23   Eldonna Suzen Octave, MD  Misc. Devices (GOJJI WEIGHT SCALE) MISC 1 Device by Does not apply route once a week. Patient not taking: Reported on 04/21/2024 07/20/23   Regino Camie LABOR, CNM  oxyCODONE  (OXY IR/ROXICODONE ) 5 MG immediate release tablet Take 1-2 tablets (5-10 mg total) by mouth every 4 (four) hours as  needed for moderate pain (pain score 4-6). Patient not taking: Reported on 04/21/2024 02/19/24   Fredirick Glenys RAMAN, MD  Prenatal 27-1 MG TABS Take 1 tablet by mouth daily. Patient not taking: Reported on 04/21/2024 07/20/23   Regino Camie LABOR, CNM  valACYclovir  (VALTREX ) 500 MG tablet Take 1 tablet (500 mg total) by mouth 2 (two) times daily. Patient not taking: Reported on 04/21/2024 12/29/23   Ilean [name], MD    Family History Family History  Problem Relation Age of Onset   Hyperlipidemia Mother    Depression Mother    Arthritis Mother    Hypertension Mother    Anxiety disorder Mother    Supraventricular tachycardia Mother    Heart disease Father    Heart murmur Father    Hyperlipidemia Maternal Grandmother    Hypertension Maternal Grandfather    Heart disease Maternal Grandfather    Diabetes Maternal Grandfather    COPD Maternal Grandfather    Heart attack Maternal Grandfather    Colon cancer Neg Hx    Esophageal cancer Neg Hx    Rectal cancer Neg Hx    Stomach cancer Neg Hx     Social History Social History[1]   Allergies   Acetaminophen , Clindamycin /lincomycin, Diphenhydramine -acetaminophen , Ortho tri-cyclen [norgestimate -eth estradiol ], and Other   Review of Systems Review of Systems  Per HPI  Physical Exam Triage Vital Signs ED Triage Vitals  Encounter Vitals Group     BP 05/21/24 0849 111/74     Girls Systolic BP Percentile --      Girls Diastolic BP Percentile --      Boys Systolic BP Percentile --      Boys Diastolic BP Percentile --      Pulse Rate 05/21/24 0849 64     Resp 05/21/24 0849 16     Temp 05/21/24 0849 98.3 F (36.8 C)     Temp Source 05/21/24 0849 Oral     SpO2 05/21/24 0849 98 %     Weight --      Height --      Head Circumference --      Peak Flow --      Pain Score 05/21/24 0848 10     Pain Loc --      Pain Education --      Exclude from Growth Chart --    No data found.  Updated Vital Signs BP 111/74 (BP Location:  Left Arm)   Pulse 64   Temp 98.3 F (36.8 C) (Oral)   Resp 16   LMP 05/12/2024 (Approximate)   SpO2 98%   Breastfeeding No  Visual Acuity Right Eye Distance:   Left Eye Distance:   Bilateral Distance:    Right Eye Near:   Left Eye Near:    Bilateral Near:     Physical Exam Vitals and nursing note reviewed.  Constitutional:      Appearance: Normal appearance.  HENT:     Head: Normocephalic and atraumatic.     Right Ear: External ear normal.     Left Ear: External ear normal.     Nose: Nose normal.     Mouth/Throat:     Mouth: Mucous membranes are moist.  Eyes:     Conjunctiva/sclera: Conjunctivae normal.  Cardiovascular:     Rate and Rhythm: Normal rate.  Pulmonary:     Effort: Pulmonary effort is normal. No respiratory distress.  Musculoskeletal:        General: Normal range of motion.  Skin:    General: Skin is warm and dry.     Findings: Abscess present.      Neurological:     General: No focal deficit present.     Mental Status: She is alert.  Psychiatric:        Mood and Affect: Mood normal.        Behavior: Behavior is cooperative.      UC Treatments / Results  Labs (all labs ordered are listed, but only abnormal results are displayed) Labs Reviewed  POCT URINE PREGNANCY    EKG   Radiology No results found.  Procedures Procedures (including critical care time)  Medications Ordered in UC Medications - No data to display  Initial Impression / Assessment and Plan / UC Course  I have reviewed the triage vital signs and the nursing notes.  Pertinent labs & imaging results that were available during my care of the patient were reviewed by me and considered in my medical decision making (see chart for details).  Vitals and triage reviewed, patient is hemodynamically stable.  Draining abscess to the right axilla with surrounding inflamed skin.  Presentation compatible with HS, will treat with doxycycline .   Patient reports it is possible  that she could be pregnant, urine pregnancy negative.  Offered IM Toradol  in clinic, patient declined.  Ibuprofen  sent to pharmacy.  Plan of care, follow-up care return precautions given, no questions at this time.     Final Clinical Impressions(s) / UC Diagnoses   Final diagnoses:  Hydradenitis     Discharge Instructions      Take the doxycycline  twice daily with food for the next 7 days Continue with warm or cool compresses to help encourage drainage For pain you can take ibuprofen  800 mg every 8 hours  Follow-up with your primary care provider if this issue persists.  Seek follow-up care if you develop fever, vomiting, or new concerning symptoms     ED Prescriptions     Medication Sig Dispense Auth. Provider   doxycycline  (VIBRA -TABS) 100 MG tablet Take 1 tablet (100 mg total) by mouth 2 (two) times daily for 7 days. 14 tablet Usbaldo Pannone  N, FNP   ibuprofen  (ADVIL ) 800 MG tablet Take 1 tablet (800 mg total) by mouth 3 (three) times daily. 30 tablet Dreama Loistine SAILOR, FNP      PDMP not reviewed this encounter.     [1]  Social History Tobacco Use   Smoking status: Former    Types: Cigarettes   Smokeless tobacco: Never   Tobacco comments:    Vaping    04/08/2023 Patient smokes marijuana daily  Vaping  Use   Vaping status: Former   Substances: Nicotine , Flavoring   Devices: stopped when pregnant  Substance Use Topics   Alcohol use: Not Currently    Alcohol/week: 2.0 standard drinks of alcohol    Types: 2 Cans of beer per week    Comment: occassional -holidays   Drug use: Not Currently    Frequency: 14.0 times per week    Types: Marijuana    Comment: last was 12/23/23 (morning to help eat breakfast, at night to help sleep)     Dreama Army SAILOR, FNP 05/21/24 [phone]  "

## 2024-05-21 NOTE — Discharge Instructions (Addendum)
 Take the doxycycline  twice daily with food for the next 7 days Continue with warm or cool compresses to help encourage drainage For pain you can take ibuprofen  800 mg every 8 hours  Follow-up with your primary care provider if this issue persists.  Seek follow-up care if you develop fever, vomiting, or new concerning symptoms

## 2024-05-21 NOTE — ED Triage Notes (Signed)
 Pt reports has hx HS. Pt had right axilla abscess for about 4-5 days. Reports it is draining some. Reports skin is really raw.

## 2024-05-23 ENCOUNTER — Encounter: Payer: Self-pay | Admitting: Family Medicine

## 2024-05-23 DIAGNOSIS — L732 Hidradenitis suppurativa: Secondary | ICD-10-CM

## 2024-05-23 MED ORDER — DOXYCYCLINE HYCLATE 100 MG PO TABS: 100.0000 mg | ORAL_TABLET | Freq: Two times a day (BID) | ORAL | 0 refills | Status: DC

## 2024-05-26 ENCOUNTER — Encounter: Payer: Self-pay | Admitting: Family Medicine

## 2024-05-26 ENCOUNTER — Ambulatory Visit (INDEPENDENT_AMBULATORY_CARE_PROVIDER_SITE_OTHER): Admitting: Family Medicine

## 2024-05-26 VITALS — BP 100/60 | HR 80 | Temp 98.4°F | Ht 65.0 in | Wt 188.6 lb

## 2024-05-26 DIAGNOSIS — L732 Hidradenitis suppurativa: Secondary | ICD-10-CM | POA: Diagnosis not present

## 2024-05-26 MED ORDER — ADAPALENE-BENZOYL PEROXIDE 0.3-2.5 % EX GEL: 1.0000 | Freq: Every day | CUTANEOUS | 5 refills | Status: AC

## 2024-05-26 MED ORDER — DOXYCYCLINE HYCLATE 100 MG PO TABS: 100.0000 mg | ORAL_TABLET | Freq: Every day | ORAL | 0 refills | Status: AC

## 2024-05-26 NOTE — Progress Notes (Signed)
 "  Established Patient Office Visit  Subjective   Patient ID: Jenna Henry, female    DOB: [birth year]  Age: 28 y.o. MRN: [id number]  Chief Complaint  Patient presents with   Cyst    Patient complains of painful cysts bilateral axilla x2 days, worse lately with blood-tinged and yellow drainage x5 days    HPI Discussed the use of AI scribe software for clinical note transcription with the patient, who gave verbal consent to proceed.  History of Present Illness   Jenna Henry is a 28 year old female with hidradenitis suppurativa who presents with a flare-up of cysts in both armpits.  She has a hidradenitis flare in both armpits with yellow drainage from the cysts and no fevers or chills. She also has sinus congestion and cold symptoms.  She had an allergic reaction to clindamycin  during pregnancy, so this antibiotic is avoided.  She is taking doxycycline  for the flare and has previously used topical treatments but has run out. She is not breastfeeding, so she is open to broader treatment options.  She reports insurance issues affecting coverage for ibuprofen  and confusion about the doxycycline  prescription and is seeking clarification so she can obtain and use these medications as directed.       Current Outpatient Medications  Medication Instructions   Adapalene -Benzoyl Peroxide  0.3-2.5 % GEL 1 Application, Apply externally, Daily   albuterol  (VENTOLIN  HFA) 108 (90 Base) MCG/ACT inhaler Inhale 2 puffs into the lungs every 4 (four) hours as needed for wheezing or shortness of breath. (Must have office visit for refills)   Blood Pressure Monitoring (BLOOD PRESSURE KIT) DEVI 1 Device, Does not apply, As needed   [START ON 05/30/2024] doxycycline  (VIBRA -TABS) 100 mg, Oral, Daily    Patient Active Problem List   Diagnosis Date Noted   History of cesarean delivery 03/24/2024   Unwanted fertility 03/24/2024   Tobacco use 02/14/2024   Carrier of spinal muscular atrophy 08/31/2023    Obesity (BMI 30.0-34.9) 04/26/2023   Fatty liver 11/17/2022   Hydradenitis 06/23/2022   PCOS (polycystic ovarian syndrome) 06/23/2022   Mild intermittent asthma 06/23/2022   MDD (major depressive disorder) 10/10/2018   Herpes 11/12/2016     Review of Systems  All other systems reviewed and are negative.     Objective:     BP 100/60   Pulse 80   Temp 98.4 F (36.9 C) (Oral)   Ht 5' 5 (1.651 m)   Wt 188 lb 9.6 oz (85.5 kg)   LMP 05/12/2024 (Approximate)   SpO2 98%   BMI 31.38 kg/m    Physical Exam Vitals reviewed.  Constitutional:      Appearance: Normal appearance. She is obese.  Skin:    Findings: Lesion (oopen draining wounds in the right axilla, there is TTP so palpation of the area is superifical, there is no induration or fluctuance of the axilla. left axills reveals no acute infection, scar tissues present from old HS) present.  Neurological:     Mental Status: She is alert.      No results found for any visits on 05/26/24.    The ASCVD Risk score (Arnett DK, et al., 2019) failed to calculate for the following reasons:   The 2019 ASCVD risk score is only valid for ages 71 to 67   * - Cholesterol units were assumed    Assessment & Plan:  Hydradenitis -     Adapalene -Benzoyl Peroxide ; Apply 1 Application topically daily.  Dispense: 60 g; Refill: 5 -  Doxycycline  Hyclate; Take 1 tablet (100 mg total) by mouth daily.  Dispense: 90 tablet; Refill: 0    Assessment and Plan    Hidradenitis suppurativa Recurrent flare-up with cysts in both axillae, more severe in the right axilla. Yellow drainage present, no fever or chills. Previous allergic reaction to clindamycin  during pregnancy. Current treatment with doxycycline . No breastfeeding, allowing for broader antibiotic options. - Continue doxycycline  treatment dose. - Prescribed benzoyl peroxide  topical (Panoxyl) for daily use in axillae. - Prescribed adapalene  and benzoyl peroxide  gel (Epiduo ) for daily  use post-shower. - Plan for three months of once daily doxycycline  post-treatment dose. - Consider reducing doxycycline  to every other day or 50 mg daily after three months. - Scheduled follow-up in three months for physical exam and reassessment of axillae.       No follow-ups on file.    Heron CHRISTELLA Sharper, MD "

## 2024-05-26 NOTE — Patient Instructions (Addendum)
 10% Benzoyl peroxide  cleanser daily  Then use the Adapalene / benzoyl gel I have called in for you daily.

## 2024-05-31 ENCOUNTER — Encounter: Payer: Self-pay | Admitting: Family Medicine

## 2024-06-06 ENCOUNTER — Encounter: Payer: Self-pay | Admitting: Family Medicine

## 2024-06-09 ENCOUNTER — Other Ambulatory Visit: Payer: Self-pay

## 2024-06-09 ENCOUNTER — Ambulatory Visit: Admitting: Family Medicine

## 2024-06-09 VITALS — BP 119/81 | HR 79 | Wt 194.4 lb

## 2024-06-09 DIAGNOSIS — L989 Disorder of the skin and subcutaneous tissue, unspecified: Secondary | ICD-10-CM

## 2024-06-09 NOTE — Progress Notes (Signed)
" ° ° °  Subjective:  Jenna Henry is a 30 y.o. female who presents to the clinic today for concern for a cyst around her C-section incision site.  HPI: Patient presenting for evaluation out of concern for a lesion that is formed around her C-section site.  She cannot see the lesion but can feel it.  Reports she has hidradenitis suppurativa in her axillas and is worried that she is now having it in her groin now.  Denies any drainage.  Denies any warmth, pain but just noticed thickening in the skin.  Objective:  Physical Exam: BP 119/81   Pulse 79   Wt 194 lb 6.4 oz (88.2 kg)   LMP 05/12/2024 (Approximate)   BMI 32.35 kg/m   Gen: Alert, well-appearing CV: RRR with no murmurs appreciated Pulm: NWOB, CTAB with no crackles, wheezes, or rhonchi GI: Normal bowel sounds present. Soft, Nontender, Nondistended. MSK: no edema, cyanosis, or clubbing noted Skin: 4 cm lesion noted 2 cm below C-section skin incision.  Nonerythematous, nonraised, not visible but can palpate it. Neuro: grossly normal, moves all extremities Psych: Normal affect and thought content  No results found for this or any previous visit (from the past 72 hours).   Assessment/Plan:  Skin lesion Skin lesion noted just below C-section incision.  Could be start of a hidradenitis suppurativa flare but does not appear infectious or irritated at this time.  Recommended a warm compress and continued monitoring and if it worsens to be reevaluated.  She was recently prescribed doxycycline  to treat her axillary hidradenitis suppurativa and is going to start taking that medication.   Lab Orders  No laboratory test(s) ordered today    No orders of the defined types were placed in this encounter.     Steffan Rover, MD Attending Family Medicine Physician, [facility] for [facility], [facility]   06/09/2024 11:07 AM  "

## 2024-06-09 NOTE — Assessment & Plan Note (Signed)
 Skin lesion noted just below C-section incision.  Could be start of a hidradenitis suppurativa flare but does not appear infectious or irritated at this time.  Recommended a warm compress and continued monitoring and if it worsens to be reevaluated.  She was recently prescribed doxycycline  to treat her axillary hidradenitis suppurativa and is going to start taking that medication.

## 2024-08-29 ENCOUNTER — Encounter: Admitting: Family Medicine
